# Patient Record
Sex: Female | Born: 1980
Health system: Southern US, Community
[De-identification: ages and names within clinical notes are randomized; demographics above are authoritative.]

## PROBLEM LIST (undated history)

## (undated) DIAGNOSIS — F1910 Other psychoactive substance abuse, uncomplicated: Secondary | ICD-10-CM

## (undated) DIAGNOSIS — F1310 Sedative, hypnotic or anxiolytic abuse, uncomplicated: Secondary | ICD-10-CM

## (undated) DIAGNOSIS — F1010 Alcohol abuse, uncomplicated: Secondary | ICD-10-CM

## (undated) DIAGNOSIS — F329 Major depressive disorder, single episode, unspecified: Secondary | ICD-10-CM

## (undated) DIAGNOSIS — F419 Anxiety disorder, unspecified: Secondary | ICD-10-CM

## (undated) DIAGNOSIS — F32A Depression, unspecified: Secondary | ICD-10-CM

## (undated) DIAGNOSIS — F319 Bipolar disorder, unspecified: Secondary | ICD-10-CM

---
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
## 2002-07-27 ENCOUNTER — Emergency Department (HOSPITAL_COMMUNITY): Admission: AD | Admit: 2002-07-27 | Discharge: 2002-07-27 | Payer: Self-pay | Admitting: Emergency Medicine

## 2005-06-20 ENCOUNTER — Emergency Department (HOSPITAL_COMMUNITY): Admission: EM | Admit: 2005-06-20 | Discharge: 2005-06-21 | Payer: Self-pay | Admitting: Emergency Medicine

## 2005-07-03 ENCOUNTER — Inpatient Hospital Stay (HOSPITAL_COMMUNITY): Admission: AD | Admit: 2005-07-03 | Discharge: 2005-07-06 | Payer: Self-pay | Admitting: Internal Medicine

## 2005-07-03 ENCOUNTER — Ambulatory Visit: Payer: Self-pay | Admitting: Internal Medicine

## 2005-07-03 ENCOUNTER — Encounter: Payer: Self-pay | Admitting: Emergency Medicine

## 2005-10-20 ENCOUNTER — Emergency Department (HOSPITAL_COMMUNITY): Admission: EM | Admit: 2005-10-20 | Discharge: 2005-10-21 | Payer: Self-pay | Admitting: Emergency Medicine

## 2006-07-24 ENCOUNTER — Inpatient Hospital Stay (HOSPITAL_COMMUNITY): Admission: EM | Admit: 2006-07-24 | Discharge: 2006-07-25 | Payer: Self-pay | Admitting: Emergency Medicine

## 2006-08-04 ENCOUNTER — Emergency Department (HOSPITAL_COMMUNITY): Admission: EM | Admit: 2006-08-04 | Discharge: 2006-08-05 | Payer: Self-pay | Admitting: Emergency Medicine

## 2006-08-05 ENCOUNTER — Inpatient Hospital Stay (HOSPITAL_COMMUNITY): Admission: AD | Admit: 2006-08-05 | Discharge: 2006-08-09 | Payer: Self-pay | Admitting: *Deleted

## 2006-08-06 ENCOUNTER — Ambulatory Visit: Payer: Self-pay | Admitting: *Deleted

## 2006-08-14 ENCOUNTER — Emergency Department (HOSPITAL_COMMUNITY): Admission: EM | Admit: 2006-08-14 | Discharge: 2006-08-15 | Payer: Self-pay | Admitting: *Deleted

## 2006-08-25 ENCOUNTER — Ambulatory Visit: Payer: Self-pay | Admitting: Internal Medicine

## 2006-08-25 ENCOUNTER — Inpatient Hospital Stay (HOSPITAL_COMMUNITY): Admission: EM | Admit: 2006-08-25 | Discharge: 2006-08-27 | Payer: Self-pay | Admitting: Emergency Medicine

## 2006-08-27 ENCOUNTER — Inpatient Hospital Stay (HOSPITAL_COMMUNITY): Admission: AD | Admit: 2006-08-27 | Discharge: 2006-08-31 | Payer: Self-pay | Admitting: Psychiatry

## 2006-10-04 ENCOUNTER — Emergency Department (HOSPITAL_COMMUNITY): Admission: EM | Admit: 2006-10-04 | Discharge: 2006-10-04 | Payer: Self-pay | Admitting: Emergency Medicine

## 2006-10-31 ENCOUNTER — Emergency Department (HOSPITAL_COMMUNITY): Admission: EM | Admit: 2006-10-31 | Discharge: 2006-11-01 | Payer: Self-pay | Admitting: Emergency Medicine

## 2006-11-16 ENCOUNTER — Emergency Department (HOSPITAL_COMMUNITY): Admission: EM | Admit: 2006-11-16 | Discharge: 2006-11-16 | Payer: Self-pay | Admitting: Podiatry

## 2007-10-08 ENCOUNTER — Inpatient Hospital Stay (HOSPITAL_COMMUNITY): Admission: EM | Admit: 2007-10-08 | Discharge: 2007-10-10 | Payer: Self-pay | Admitting: Emergency Medicine

## 2008-08-14 ENCOUNTER — Ambulatory Visit: Payer: Self-pay | Admitting: *Deleted

## 2008-08-14 ENCOUNTER — Inpatient Hospital Stay (HOSPITAL_COMMUNITY): Admission: AD | Admit: 2008-08-14 | Discharge: 2008-08-19 | Payer: Self-pay | Admitting: *Deleted

## 2008-12-24 ENCOUNTER — Emergency Department (HOSPITAL_COMMUNITY): Admission: EM | Admit: 2008-12-24 | Discharge: 2008-12-24 | Payer: Self-pay | Admitting: Emergency Medicine

## 2008-12-29 ENCOUNTER — Emergency Department (HOSPITAL_BASED_OUTPATIENT_CLINIC_OR_DEPARTMENT_OTHER): Admission: EM | Admit: 2008-12-29 | Discharge: 2008-12-29 | Payer: Self-pay | Admitting: Emergency Medicine

## 2009-09-10 ENCOUNTER — Emergency Department (HOSPITAL_COMMUNITY): Admission: EM | Admit: 2009-09-10 | Discharge: 2009-09-11 | Payer: Self-pay | Admitting: Infectious Diseases

## 2009-09-12 ENCOUNTER — Emergency Department (HOSPITAL_COMMUNITY): Admission: EM | Admit: 2009-09-12 | Discharge: 2009-09-12 | Payer: Self-pay | Admitting: Emergency Medicine

## 2009-11-20 ENCOUNTER — Emergency Department (HOSPITAL_COMMUNITY): Admission: EM | Admit: 2009-11-20 | Discharge: 2009-11-21 | Payer: Self-pay | Admitting: Emergency Medicine

## 2009-12-19 ENCOUNTER — Emergency Department (HOSPITAL_COMMUNITY): Admission: EM | Admit: 2009-12-19 | Discharge: 2009-12-19 | Payer: Self-pay | Admitting: Emergency Medicine

## 2009-12-19 ENCOUNTER — Inpatient Hospital Stay (HOSPITAL_COMMUNITY): Admission: AD | Admit: 2009-12-19 | Discharge: 2009-12-23 | Payer: Self-pay | Admitting: Psychiatry

## 2009-12-19 ENCOUNTER — Ambulatory Visit: Payer: Self-pay | Admitting: Psychiatry

## 2009-12-26 ENCOUNTER — Emergency Department (HOSPITAL_COMMUNITY): Admission: EM | Admit: 2009-12-26 | Discharge: 2009-12-26 | Payer: Self-pay | Admitting: Emergency Medicine

## 2009-12-27 ENCOUNTER — Ambulatory Visit (HOSPITAL_COMMUNITY): Admission: RE | Admit: 2009-12-27 | Discharge: 2009-12-27 | Payer: Self-pay | Admitting: Psychiatry

## 2009-12-27 ENCOUNTER — Emergency Department (HOSPITAL_COMMUNITY): Admission: EM | Admit: 2009-12-27 | Discharge: 2009-12-28 | Payer: Self-pay | Admitting: Emergency Medicine

## 2009-12-28 ENCOUNTER — Inpatient Hospital Stay (HOSPITAL_COMMUNITY): Admission: AD | Admit: 2009-12-28 | Discharge: 2009-12-30 | Payer: Self-pay | Admitting: Psychiatry

## 2010-01-17 ENCOUNTER — Emergency Department (HOSPITAL_COMMUNITY): Admission: EM | Admit: 2010-01-17 | Discharge: 2010-01-18 | Payer: Self-pay | Admitting: Emergency Medicine

## 2010-01-18 ENCOUNTER — Inpatient Hospital Stay (HOSPITAL_COMMUNITY): Admission: AD | Admit: 2010-01-18 | Discharge: 2010-01-29 | Payer: Self-pay | Admitting: Psychiatry

## 2010-02-03 ENCOUNTER — Emergency Department (HOSPITAL_COMMUNITY): Admission: EM | Admit: 2010-02-03 | Discharge: 2010-02-04 | Payer: Self-pay | Admitting: Emergency Medicine

## 2010-02-03 ENCOUNTER — Ambulatory Visit (HOSPITAL_COMMUNITY): Admission: RE | Admit: 2010-02-03 | Discharge: 2010-02-03 | Payer: Self-pay | Admitting: Psychiatry

## 2010-04-21 ENCOUNTER — Emergency Department (HOSPITAL_COMMUNITY)
Admission: EM | Admit: 2010-04-21 | Discharge: 2010-04-22 | Disposition: A | Discharge status: Other Acute Inpatient Hospital | Payer: Self-pay | Source: Home / Self Care | Admitting: Emergency Medicine

## 2010-04-22 ENCOUNTER — Inpatient Hospital Stay (HOSPITAL_COMMUNITY)
Admission: RE | Admit: 2010-04-22 | Discharge: 2010-05-02 | Payer: Self-pay | Source: Home / Self Care | Attending: Psychiatry | Admitting: Psychiatry

## 2010-04-25 LAB — URINE MICROSCOPIC-ADD ON

## 2010-04-25 LAB — BASIC METABOLIC PANEL
BUN: 7 mg/dL (ref 6–23)
CO2: 23 mEq/L (ref 19–32)
Calcium: 9.1 mg/dL (ref 8.4–10.5)
Chloride: 110 mEq/L (ref 96–112)
Creatinine, Ser: 0.55 mg/dL (ref 0.4–1.2)
GFR calc Af Amer: >60 mL/min (ref >60)
GFR calc non Af Amer: >60 mL/min (ref >60)
Glucose, Bld: 95 mg/dL (ref 70–99)
Potassium: 3.8 mEq/L (ref 3.5–5.1)
Sodium: 141 mEq/L (ref 135–145)

## 2010-04-25 LAB — DIFFERENTIAL
Basophils Absolute: 0 10*3/uL (ref 0.0–0.1)
Basophils Relative: 0 % (ref 0–1)
Eosinophils Absolute: 0 10*3/uL (ref 0.0–0.7)
Eosinophils Relative: 0 % (ref 0–5)
Lymphocytes Relative: 54 % — ABNORMAL HIGH (ref 12–46)
Lymphs Abs: 2.6 10*3/uL (ref 0.7–4.0)
Monocytes Absolute: 0.4 10*3/uL (ref 0.1–1.0)
Monocytes Relative: 9 % (ref 3–12)
Neutro Abs: 1.8 10*3/uL (ref 1.7–7.7)
Neutrophils Relative %: 36 % — ABNORMAL LOW (ref 43–77)

## 2010-04-25 LAB — CBC
HCT: 38.9 % (ref 36.0–46.0)
Hemoglobin: 13.4 g/dL (ref 12.0–15.0)
MCH: 33.4 pg (ref 26.0–34.0)
MCHC: 34.4 g/dL (ref 30.0–36.0)
MCV: 97 fL (ref 78.0–100.0)
Platelets: 322 10*3/uL (ref 150–400)
RBC: 4.01 MIL/uL (ref 3.87–5.11)
RDW: 12.4 % (ref 11.5–15.5)
WBC: 4.9 10*3/uL (ref 4.0–10.5)

## 2010-04-25 LAB — RAPID URINE DRUG SCREEN, HOSP PERFORMED
Amphetamines: NOT DETECTED
Barbiturates: NOT DETECTED
Benzodiazepines: NOT DETECTED
Cocaine: NOT DETECTED
Opiates: NOT DETECTED
Tetrahydrocannabinol: POSITIVE — AB

## 2010-04-25 LAB — URINALYSIS, ROUTINE W REFLEX MICROSCOPIC
Bilirubin Urine: NEGATIVE
Ketones, ur: NEGATIVE mg/dL
Leukocytes, UA: NEGATIVE
Nitrite: NEGATIVE
Protein, ur: NEGATIVE mg/dL
Specific Gravity, Urine: 1.008 (ref 1.005–1.030)
Urine Glucose, Fasting: NEGATIVE mg/dL
Urobilinogen, UA: 0.2 mg/dL (ref 0.0–1.0)
pH: 7 (ref 5.0–8.0)

## 2010-04-25 LAB — TRICYCLICS SCREEN, URINE: TCA Scrn: NOT DETECTED

## 2010-04-25 LAB — ETHANOL: Alcohol, Ethyl (B): 135 mg/dL — ABNORMAL HIGH (ref 0–10)

## 2010-04-27 LAB — COMPREHENSIVE METABOLIC PANEL
ALT: 15 U/L (ref 0–35)
AST: 16 U/L (ref 0–37)
Albumin: 3.6 g/dL (ref 3.5–5.2)
Alkaline Phosphatase: 39 U/L (ref 39–117)
BUN: 10 mg/dL (ref 6–23)
CO2: 25 mEq/L (ref 19–32)
Calcium: 9.1 mg/dL (ref 8.4–10.5)
Chloride: 106 mEq/L (ref 96–112)
Creatinine, Ser: 0.76 mg/dL (ref 0.4–1.2)
GFR calc Af Amer: >60 mL/min (ref >60)
GFR calc non Af Amer: >60 mL/min (ref >60)
Glucose, Bld: 141 mg/dL — ABNORMAL HIGH (ref 70–99)
Potassium: 3.8 mEq/L (ref 3.5–5.1)
Sodium: 139 mEq/L (ref 135–145)
Total Bilirubin: 0.5 mg/dL (ref 0.3–1.2)
Total Protein: 6.6 g/dL (ref 6.0–8.3)

## 2010-04-27 LAB — VALPROIC ACID LEVEL: Valproic Acid Lvl: 74.4 ug/mL (ref 50.0–100.0)
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
## 2010-06-22 LAB — DIFFERENTIAL
Basophils Absolute: 0 10*3/uL (ref 0.0–0.1)
Basophils Relative: 1 % (ref 0–1)
Eosinophils Absolute: 0 10*3/uL (ref 0.0–0.7)
Eosinophils Relative: 1 % (ref 0–5)
Lymphocytes Relative: 46 % (ref 12–46)
Lymphs Abs: 2.6 10*3/uL (ref 0.7–4.0)
Monocytes Absolute: 0.5 10*3/uL (ref 0.1–1.0)
Monocytes Relative: 8 % (ref 3–12)
Neutro Abs: 2.6 10*3/uL (ref 1.7–7.7)
Neutrophils Relative %: 45 % (ref 43–77)

## 2010-06-22 LAB — BASIC METABOLIC PANEL
BUN: 9 mg/dL (ref 6–23)
CO2: 21 mEq/L (ref 19–32)
Calcium: 8.9 mg/dL (ref 8.4–10.5)
Chloride: 109 mEq/L (ref 96–112)
Creatinine, Ser: 0.65 mg/dL (ref 0.4–1.2)
GFR calc Af Amer: >60 mL/min (ref >60)
GFR calc non Af Amer: >60 mL/min (ref >60)
Glucose, Bld: 95 mg/dL (ref 70–99)
Potassium: 3.7 mEq/L (ref 3.5–5.1)
Sodium: 140 mEq/L (ref 135–145)

## 2010-06-22 LAB — ETHANOL: Alcohol, Ethyl (B): 117 mg/dL — ABNORMAL HIGH (ref 0–10)

## 2010-06-22 LAB — RAPID URINE DRUG SCREEN, HOSP PERFORMED
Amphetamines: NOT DETECTED
Barbiturates: NOT DETECTED
Benzodiazepines: POSITIVE — AB
Cocaine: NOT DETECTED
Opiates: NOT DETECTED
Tetrahydrocannabinol: POSITIVE — AB

## 2010-06-22 LAB — CBC
HCT: 34.4 % — ABNORMAL LOW (ref 36.0–46.0)
Hemoglobin: 12 g/dL (ref 12.0–15.0)
MCH: 34.4 pg — ABNORMAL HIGH (ref 26.0–34.0)
MCHC: 34.9 g/dL (ref 30.0–36.0)
MCV: 98.5 fL (ref 78.0–100.0)
Platelets: 281 10*3/uL (ref 150–400)
RBC: 3.49 MIL/uL — ABNORMAL LOW (ref 3.87–5.11)
RDW: 12.8 % (ref 11.5–15.5)
WBC: 5.7 10*3/uL (ref 4.0–10.5)

## 2010-06-22 LAB — POCT PREGNANCY, URINE: Preg Test, Ur: NEGATIVE

## 2010-06-22 LAB — TRICYCLICS SCREEN, URINE: TCA Scrn: NOT DETECTED

## 2010-06-23 LAB — BASIC METABOLIC PANEL WITH GFR
BUN: 7 mg/dL (ref 6–23)
CO2: 21 meq/L (ref 19–32)
Calcium: 8.7 mg/dL (ref 8.4–10.5)
Chloride: 114 meq/L — ABNORMAL HIGH (ref 96–112)
Creatinine, Ser: 0.49 mg/dL (ref 0.4–1.2)
GFR calc non Af Amer: >60 mL/min
Glucose, Bld: 97 mg/dL (ref 70–99)
Potassium: 3.6 meq/L (ref 3.5–5.1)
Sodium: 141 meq/L (ref 135–145)

## 2010-06-23 LAB — BASIC METABOLIC PANEL
BUN: 6 mg/dL (ref 6–23)
CO2: 24 mEq/L (ref 19–32)
Calcium: 9.6 mg/dL (ref 8.4–10.5)
Chloride: 107 mEq/L (ref 96–112)
Chloride: 109 mEq/L (ref 96–112)
Creatinine, Ser: 0.56 mg/dL (ref 0.4–1.2)
Creatinine, Ser: 0.58 mg/dL (ref 0.4–1.2)
GFR calc Af Amer: >60 mL/min (ref >60)
GFR calc Af Amer: >60 mL/min (ref >60)
GFR calc non Af Amer: >60 mL/min (ref >60)
Potassium: 4.2 mEq/L (ref 3.5–5.1)
Sodium: 140 mEq/L (ref 135–145)
Sodium: 140 mEq/L (ref 135–145)

## 2010-06-23 LAB — CBC
HCT: 36.8 % (ref 36.0–46.0)
HCT: 39 % (ref 36.0–46.0)
Hemoglobin: 12.9 g/dL (ref 12.0–15.0)
Hemoglobin: 12.9 g/dL (ref 12.0–15.0)
Hemoglobin: 13.6 g/dL (ref 12.0–15.0)
MCH: 34 pg (ref 26.0–34.0)
MCH: 34.5 pg — ABNORMAL HIGH (ref 26.0–34.0)
MCH: 34.6 pg — ABNORMAL HIGH (ref 26.0–34.0)
MCHC: 35 g/dL (ref 30.0–36.0)
MCHC: 35 g/dL (ref 30.0–36.0)
MCV: 98.7 fL (ref 78.0–100.0)
MCV: 98.9 fL (ref 78.0–100.0)
MCV: 99 fL (ref 78.0–100.0)
Platelets: 301 10*3/uL (ref 150–400)
Platelets: 322 10*3/uL (ref 150–400)
RBC: 3.72 MIL/uL — ABNORMAL LOW (ref 3.87–5.11)
RBC: 3.73 MIL/uL — ABNORMAL LOW (ref 3.87–5.11)
RBC: 3.96 MIL/uL (ref 3.87–5.11)
RDW: 12.9 % (ref 11.5–15.5)
RDW: 13 % (ref 11.5–15.5)
WBC: 5.2 10*3/uL (ref 4.0–10.5)
WBC: 5.6 10*3/uL (ref 4.0–10.5)

## 2010-06-23 LAB — HEPATIC FUNCTION PANEL
ALT: 12 U/L (ref 0–35)
ALT: 16 U/L (ref 0–35)
AST: 13 U/L (ref 0–37)
AST: 15 U/L (ref 0–37)
Albumin: 4.2 g/dL (ref 3.5–5.2)
Alkaline Phosphatase: 41 U/L (ref 39–117)
Alkaline Phosphatase: 48 U/L (ref 39–117)
Bilirubin, Direct: 0.1 mg/dL (ref 0.0–0.3)
Bilirubin, Direct: 0.1 mg/dL (ref 0.0–0.3)
Indirect Bilirubin: 0.3 mg/dL (ref 0.3–0.9)
Total Bilirubin: 0.4 mg/dL (ref 0.3–1.2)
Total Bilirubin: 0.4 mg/dL (ref 0.3–1.2)
Total Protein: 7.2 g/dL (ref 6.0–8.3)

## 2010-06-23 LAB — DIFFERENTIAL
Basophils Absolute: 0 10*3/uL (ref 0.0–0.1)
Basophils Relative: 1 % (ref 0–1)
Basophils Relative: 1 % (ref 0–1)
Eosinophils Absolute: 0 10*3/uL (ref 0.0–0.7)
Eosinophils Absolute: 0.1 10*3/uL (ref 0.0–0.7)
Eosinophils Relative: 1 % (ref 0–5)
Eosinophils Relative: 2 % (ref 0–5)
Lymphocytes Relative: 39 % (ref 12–46)
Lymphocytes Relative: 43 % (ref 12–46)
Lymphs Abs: 2.1 10*3/uL (ref 0.7–4.0)
Monocytes Absolute: 0.2 10*3/uL (ref 0.1–1.0)
Monocytes Relative: 4 % (ref 3–12)
Monocytes Relative: 7 % (ref 3–12)
Monocytes Relative: 6 % (ref 3–12)
Neutro Abs: 2.8 10*3/uL (ref 1.7–7.7)
Neutro Abs: 2.2 10*3/uL (ref 1.7–7.7)
Neutrophils Relative %: 56 % (ref 43–77)
Neutrophils Relative %: 42 % — ABNORMAL LOW (ref 43–77)

## 2010-06-23 LAB — TRICYCLICS SCREEN, URINE: TCA Scrn: NOT DETECTED

## 2010-06-23 LAB — RAPID URINE DRUG SCREEN, HOSP PERFORMED
Amphetamines: NOT DETECTED
Amphetamines: NOT DETECTED
Barbiturates: NOT DETECTED
Barbiturates: NOT DETECTED
Benzodiazepines: NOT DETECTED
Benzodiazepines: NOT DETECTED
Cocaine: NOT DETECTED
Cocaine: NOT DETECTED
Opiates: NOT DETECTED
Opiates: NOT DETECTED
Tetrahydrocannabinol: POSITIVE — AB
Tetrahydrocannabinol: POSITIVE — AB
Tetrahydrocannabinol: POSITIVE — AB
Tetrahydrocannabinol: POSITIVE — AB

## 2010-06-23 LAB — TSH: TSH: 2.017 u[IU]/mL (ref 0.350–4.500)

## 2010-06-23 LAB — URINALYSIS, ROUTINE W REFLEX MICROSCOPIC
Bilirubin Urine: NEGATIVE
Glucose, UA: NEGATIVE mg/dL
Ketones, ur: NEGATIVE mg/dL
Leukocytes, UA: NEGATIVE
Nitrite: NEGATIVE
Protein, ur: NEGATIVE mg/dL
Specific Gravity, Urine: 1.008 (ref 1.005–1.030)
Urobilinogen, UA: 0.2 mg/dL (ref 0.0–1.0)
pH: 5.5 (ref 5.0–8.0)

## 2010-06-23 LAB — ACETAMINOPHEN LEVEL: Acetaminophen (Tylenol), Serum: <10 ug/mL — ABNORMAL LOW (ref 10–30)

## 2010-06-23 LAB — PREGNANCY, URINE: Preg Test, Ur: NEGATIVE

## 2010-06-23 LAB — URINE MICROSCOPIC-ADD ON

## 2010-06-23 LAB — ETHANOL: Alcohol, Ethyl (B): 131 mg/dL — ABNORMAL HIGH (ref 0–10)

## 2010-06-23 LAB — POCT PREGNANCY, URINE: Preg Test, Ur: NEGATIVE

## 2010-06-24 LAB — URINE MICROSCOPIC-ADD ON

## 2010-06-24 LAB — URINALYSIS, ROUTINE W REFLEX MICROSCOPIC
Leukocytes, UA: NEGATIVE
Nitrite: NEGATIVE
Specific Gravity, Urine: 1.021 (ref 1.005–1.030)
pH: 6 (ref 5.0–8.0)

## 2010-07-15 LAB — BASIC METABOLIC PANEL
CO2: 24 mEq/L (ref 19–32)
Calcium: 9.7 mg/dL (ref 8.4–10.5)
Creatinine, Ser: 0.6 mg/dL (ref 0.4–1.2)
Glucose, Bld: 92 mg/dL (ref 70–99)

## 2010-07-15 LAB — DIFFERENTIAL
Basophils Absolute: 0.1 10*3/uL (ref 0.0–0.1)
Basophils Relative: 1 % (ref 0–1)
Eosinophils Absolute: 0.1 10*3/uL (ref 0.0–0.7)
Monocytes Absolute: 0.6 10*3/uL (ref 0.1–1.0)
Neutro Abs: 5 10*3/uL (ref 1.7–7.7)
Neutrophils Relative %: 55 % (ref 43–77)

## 2010-07-15 LAB — URINALYSIS, ROUTINE W REFLEX MICROSCOPIC
Ketones, ur: NEGATIVE mg/dL
Protein, ur: NEGATIVE mg/dL
Urobilinogen, UA: 1 mg/dL (ref 0.0–1.0)

## 2010-07-15 LAB — POCT TOXICOLOGY PANEL

## 2010-07-15 LAB — CBC
MCHC: 34.5 g/dL (ref 30.0–36.0)
RDW: 11.6 % (ref 11.5–15.5)

## 2010-07-15 LAB — URINE MICROSCOPIC-ADD ON

## 2010-07-19 LAB — URINALYSIS, MICROSCOPIC ONLY
Glucose, UA: NEGATIVE mg/dL
Hgb urine dipstick: NEGATIVE
Leukocytes, UA: NEGATIVE
Protein, ur: 100 mg/dL — AB
pH: 5.5 (ref 5.0–8.0)

## 2010-07-19 LAB — COMPREHENSIVE METABOLIC PANEL
Albumin: 4.2 g/dL (ref 3.5–5.2)
BUN: 10 mg/dL (ref 6–23)
Calcium: 9.6 mg/dL (ref 8.4–10.5)
Creatinine, Ser: 0.8 mg/dL (ref 0.4–1.2)
Glucose, Bld: 89 mg/dL (ref 70–99)
Potassium: 3.2 mEq/L — ABNORMAL LOW (ref 3.5–5.1)
Total Protein: 6.8 g/dL (ref 6.0–8.3)

## 2010-07-19 LAB — DRUGS OF ABUSE SCREEN W/O ALC, ROUTINE URINE
Amphetamine Screen, Ur: NEGATIVE
Barbiturate Quant, Ur: NEGATIVE
Creatinine,U: 262.2 mg/dL
Opiate Screen, Urine: NEGATIVE
Phencyclidine (PCP): NEGATIVE

## 2010-07-19 LAB — CBC
HCT: 37.9 % (ref 36.0–46.0)
Hemoglobin: 13.4 g/dL (ref 12.0–15.0)
MCHC: 35.3 g/dL (ref 30.0–36.0)
MCV: 99.5 fL (ref 78.0–100.0)
Platelets: 305 10*3/uL (ref 150–400)
RDW: 12.8 % (ref 11.5–15.5)

## 2010-07-19 LAB — BENZODIAZEPINE, QUANTITATIVE, URINE
Alprazolam (GC/LC/MS), ur confirm: NEGATIVE
Flurazepam GC/MS Conf: NEGATIVE
Oxazepam GC/MS Conf: 240 ng/mL

## 2010-07-19 LAB — THC (MARIJUANA), URINE, CONFIRMATION: Marijuana, Ur-Confirmation: 41 ng/mL

## 2010-07-19 LAB — TSH: TSH: 1.664 u[IU]/mL (ref 0.350–4.500)
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
## 2010-08-23 NOTE — Consult Note (Signed)
NAMEARLENY, [name]            ACCOUNT NO.:  [id number]   MEDICAL RECORD NO.:  [id number]          PATIENT TYPE:  INP   LOCATION:  5529                         FACILITY:  [facility]   PHYSICIAN:  Antonietta Breach, M.D.  DATE OF BIRTH:  1981-01-17   DATE OF CONSULTATION:  10/10/2007  DATE OF DISCHARGE:                                 CONSULTATION   Ms. [name] has recovered from her intoxicated state.  She recognizes  that the alcohol intoxication impaired her judgment and increased her  impulsivity.   She asked that her father attend part of the session to help with the  history.  Her father and the patient both describe how the patient has  been talking about constructive future goals and interests including  finishing her high school education and going to [employer] school.  She  does not have any thoughts of harming herself or others.  She has no  delusions or hallucinations.  She is socially appropriate and  cooperative.   MENTAL STATUS EXAM:  Ms. [name] is alert.  She is oriented to all  spheres.  Speech within normal limits.  Affect broad and appropriate,  mood within normal limits.  Thought process logical, coherent, goal-  directed.  No looseness of associations.  Thought content:  No thoughts  of harming herself, no thoughts of harming others, no delusions, no  hallucinations.  Judgment intact.  Insight intact.   ASSESSMENT:  1. 293.83 mood disorder not otherwise specified now stable.  2. Alcohol abuse.   Ms. [name] is no longer at risk to harm herself or others now that she  is recovered from her intoxication.  She agrees to call emergency  services immediately for any thoughts of harming herself, thoughts of  harming others or other psychiatric emergency symptoms.   The undersigned did recommend that the patient be admitted for an  inpatient psychiatric dual diagnosis program and proceed with alcohol  rehabilitation.  However, the patient declined and she is no  longer  committable after recovering from her intoxicated state.   The patient wants to proceed with outpatient counseling and 12 step  groups.  The social worker can help arrange these for the patient and  give her 12 step information.  She will have psychiatric medication  management with her county mental health psychiatrist.  She will have no  change in her outpatient psychiatric medications.      Antonietta Breach, M.D.  Electronically Signed     JW/MEDQ  D:  10/10/2007  T:  10/10/2007  Job:  [id number]

## 2010-08-23 NOTE — Consult Note (Signed)
[name], [name]            ACCOUNT NO.:  [id number]   MEDICAL RECORD NO.:  [id number]          PATIENT TYPE:  INP   LOCATION:  3316                         FACILITY:  [facility]   PHYSICIAN:  Antonietta Breach, M.D.  DATE OF BIRTH:  [birth year]   DATE OF CONSULTATION:  10/08/2007  DATE OF DISCHARGE:                                 CONSULTATION   REASON FOR CONSULTATION:  The patient overdosed on medication.   REQUESTING PHYSICIAN:  [facility].   HISTORY OF PRESENT ILLNESS:  [name] is a 30 year old female  admitted to the [facility] on October 08, 2007 due to an overdose.   The patient has a very depressed mood.  Her energy is poor.  She is  concentrating poorly.  She states that she does not know why she tried  to kill herself.  She has poor insight and judgment.   PAST PSYCHIATRIC HISTORY:  In review of the past medical record, the  patient has bipolar disorder.  She also overdosed in November of 2008.  She required admission to a psychiatric unit, at [facility].  At that time she was discharged on Depakote 1000 mg daily,  Celexa 20 mg daily, Thorazine 100 mg t.i.d. as well as Neurontin 200 mg  t.i.d.   FAMILY PSYCHIATRIC HISTORY:  None known.   SOCIAL HISTORY:  The patient he is single.  Her urine drug screen was  positive for tetrahydrocannabinol.   PAST MEDICAL HISTORY:  Status post overdose.   MEDICATIONS:  MAR reviewed.  1. The patient is on Celexa 20 mg daily.  2. Depakote 1000 mg nightly.  3. Neurontin 300 mg t.i.d.   She has no known drug allergies.   LABORATORY DATA:  WBC 7.0, hemoglobin 13.4, platelet count 313,000.  Aspirin, Tylenol, magnesium all unremarkable.  Depakote showed no level.  Urine drug screen as above.  Alcohol was 184.   REVIEW OF SYSTEMS:  Noncontributory.   MENTAL STATUS EXAM:  Please see the history of present illness.  The  patient's affect is very constricted.  Mood is depressed.  Judgment  impaired.  Insight poor.  Concentration decreased.  Thought process  coherent.   ASSESSMENT:  AXIS I:  293.83  Mood disorder not otherwise specified.  Rule out alcohol dependence.  AXIS II:  Deferred.  AXIS III:  Status post overdose.  AXIS IV:  Primary support group.  AXIS V:  30.   This patient for admission.  This is her second suicide attempt in less  than a year.   RECOMMENDATIONS:  Would continue suicide precautions.  Would admit to a  psychiatric unit as soon as she is medically cleared.   Would also have Ativan available 1 to 2 mg q.4 hours p.r.n. withdrawal.  Other psychotropic medications deferred.      Antonietta Breach, M.D.  Electronically Signed     JW/MEDQ  D:  10/08/2007  T:  10/08/2007  Job:  [id number]

## 2010-08-23 NOTE — H&P (Signed)
[name], [name]            ACCOUNT NO.:  [id number]   MEDICAL RECORD NO.:  [id number]          PATIENT TYPE:  INP   LOCATION:  1824                         FACILITY:  [facility]   PHYSICIAN:  Lonia Blood, M.D.       DATE OF BIRTH:  04-13-1980   DATE OF ADMISSION:  10/08/2007  DATE OF DISCHARGE:                              HISTORY & PHYSICAL   PRIMARY CARE PHYSICIAN:  This patient does not have a primary care  physician.   CHIEF COMPLAINT:  Overdose.   HISTORY OF PRESENT ILLNESS:  [name] is a 30 year old woman, who was  brought in tonight by EMS after she took about 10 tablets of Thorazine  with alcohol.  The patient was observed in the emergency room and was  referred for admission about 4 hours after initial presentation.  Currently, I can arouse the patient and when asked she denies suicidal  ideations or suicide intent.  She will not doze, stay awake, and  cooperate with the interviewer over the physical examination.  She  politely is telling me not to  examine her.  According to the old  records, [name] has bipolar disorder and she has had multiple  overdoses.   CURRENT MEDICATIONS:  Most likely the patient is on Depakote, Celexa,  Thorazine, and Neurontin.   SOCIAL HISTORY:  According to the old records, the patient is abusing  tobacco and alcohol as well as cannabis and cocaine.   ALLERGIES:  No known drug allergies.   FAMILY HISTORY:  The patient is refusing to answer the questions.   REVIEW OF SYSTEMS:  The patient is refusing to answer the questions.   PHYSICAL EXAM UPON ADMISSION:  VITAL SIGNS:  Temperature 97.4, blood  pressure 113/78, pulse 147, respirations 22, saturation 97% on room air.  GENERAL APPEARANCE:  Well-developed, well-nourished woman lying on  stretcher, arousable, refusing to cooperate with exam, refusing for me  to fully examine her.  She seems to be able to move all her extremities.  HEAD:  Appears normocephalic, atraumatic.  CHEST:  Has even breath sounds, unlabored.  Telemetry strip shows sinus  tachycardia.  SKIN:  Appears warm, dry, without any visible rashes.   LABORATORY VALUES:  On admission, white blood cell count 7000,  hemoglobin 13,  platelet count 313, alcohol 184, sodium 141, potassium  3.7, chloride 108, BUN 9, creatinine 0.7.  Urine drug screen is positive  marijuana.  Alcohol level is 184.   ASSESSMENT AND PLAN:  1. Thorazine overdose.  [name] will be admitted to step-down      unit.  She will be placed on intravenous fluids.  Telemetry      monitoring will be undertaken to detect any arrhythmias.  EKG      obtained in the emergency room indicates sinus tachycardia without      QRS or QT prolongation.  The patient's acetaminophen and salicylate      and magnesium levels will be checked bed in the emergency room.      The patient will be placed on suicide precautions and a psychiatric  consultation will be obtained in the morning.  2. Polysubstance abuse.  Counseling and referral for substance abuse      treatment will be undertaken.  3. Alcohol intoxication.  The patient will be placed on fluids and      intravenous vitamins.   I have personally called to [facility] and they  advised [place] to monitor the patient for arrhythmias, monitor her blood  pressure, temperature, and monitor her for seizures.  The care for this  overdose will be symptomatic and plans will be dictated by further  clinical course.      Lonia Blood, M.D.  Electronically Signed     SL/MEDQ  D:  10/08/2007  T:  10/08/2007  Job:  [id number]
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
## 2010-08-23 NOTE — H&P (Signed)
[name], [name]            ACCOUNT NO.:  [id number]   MEDICAL RECORD NO.:  [id number]          PATIENT TYPE:  IPS   LOCATION:  0600                          [facility]ACILITY:  [facility]   PHYSICIAN:  Anselm Jungling, MD  DATE OF BIRTH:  January 18, 1981   DATE OF ADMISSION:  08/27/2006  DATE OF DISCHARGE:                       PSYCHIATRIC ADMISSION ASSESSMENT   DATE OF ASSESSMENT:  Aug 27, 2004 at 9:30 a.m.   IDENTIFYING INFORMATION:  A 30 year old white female who is single.  This is a voluntary admission.   HISTORY OF PRESENT ILLNESS:  This is a 30 year old patient who was  transferred from the Internal Medicine Teaching Service where she had  been hospitalized from May 17 through Aug 27, 2006, after taking an  overdose of approximately 2-4 pills of Depakote 500 mg, 10-15 pills of  lithium 300 mg, and also a handful of Celexa 20 mg and an unknown amount  of Xanax which she had obtained from a friend.  She also said that she  had used about $120.00 worth of cocaine that day and admits that she has  been drinking about a fifth of alcohol every day.  She was medically  stabilized and transferred for inpatient psychiatric treatment.  She  reports that her trigger for continuing to abuse substances and for  suicide was  ongoing argument and friction with her same-sex partner  with whom she has been in a 2-year relationship.  She reports her  partner wants to breakup with her, but she is unable to let the  relationship go, would like to reconcile and remain together, but the  partner has problems with the patient's substance use.  The patient  reports that, after her discharge from [facility] 2 weeks ago, she continued to use substances without any  significant period of abstinence.  Longest period ever abstinent from  substances was several months with the help of staying in church and  support of her church friends.  She is most recently living with her  parents and  may return there.  She denies any suicidal thoughts today,  voices intent to remain abstinent from substances.  She also reports  that she was not taking her medications because of inability to afford  them, but she reports that she felt she was doing well on Depakote,  Neurontin, Thorazine and Celexa.  She is currently sponsored by [facility].   PAST PSYCHIATRIC HISTORY:  The patient's second admission to [facility] with a history of previous admission  approximately 2 weeks ago.  At that time, she was on the service of Dr.  Milford Cage, also treated for polysubstance abuse and mood disorder.  She has a history of polysubstance abuse with longest period abstinent  is unclear.  She has a history of multiple suicide attempts by overdose.   SOCIAL HISTORY:  Single white female in same-sex relationship.  Currently, has a court date pending on May 23 for multiple misdemeanor  convictions and violation of parole.  She is currently living with her  parents.  No children.  MEDICAL HISTORY:  No regular primary care Marsha Gundlach.  She was followed on  the teaching service by Dr. Duncan Dull.  Medical problems include  status post polypharmacy overdose and obesity.   CURRENT MEDICATIONS:  From her previous discharge at [facility] 2 weeks ago:  Lithium 300 mg p.o. b.i.d.;  Lamictal 25 mg daily; Thorazine 200 mg 3 times a day; Celexa 20 mg  daily; Neurontin 200 mg q.4 h p.r.n. for agitation and valproic acid  1000 mg p.o. nightly.   VITAL SIGNS:  Physical exam was done in the emergency room.  Her vital  signs are normal.  She is fully alert here.  5 feet 4 inches tall, 190  pounds, temperature 97.7, pulse 66, respirations 20, blood pressure  132/90.  MOTOR:  Normal.   Most remarkable thing in her diagnostic studies while on the medicine  unit is her lithium level at one point did reach a peak of 2.33 and then  resolved.   Her lithium will not be restarted.  Other details on  diagnostic studies are generally unremarkable and noted in the record.   MENTAL STATUS EXAMINATION:  Today, fully alert female with normal motor  exam, cooperative with blunt affect.  Speech is minimal, normal pace and  tone.  She is adequately articulate.  Mood is euthymic.  She is  minimizing her substance use and the role that it plays in her  relationships and mood.  Insight is superficial.  Thought processes  logical, coherent.  No evidence of suicidal thoughts today.  No  homicidal thoughts.  Cognition is well-preserved.  Calculation and  concentration are intact.   AXIS I:  Mood disorder NOS.  EtOH abuse rule out dependence,  polysubstance abuse.  AXIS II:  Deferred.  AXIS III:  Status post polypharmacy overdose and obesity.  AXIS IV:  Severe issues with chronic relationship discord.  AXIS V:  Current 30, past year 58.   Plan is to voluntarily admit the patient to completely detox her,  stabilize her mood and alleviate any suicidal thoughts.  We have offered  to have a family session with her girlfriend to try to talk through some  of the relationship issues and confront any problems and gauge the level  of support there, which the patient will consider, but she has not  discussed at this point.  We are going to restart her Depakote at 1000  mg daily, along with her Neurontin and her Thorazine, and we will check  a Depakote level on the morning of May 23 and plan on having her follow  up with [facility].  Estimated length of stay is 5  days.      Margaret A. Lorin Picket, N.P.      Anselm Jungling, MD  Electronically Signed    MAS/MEDQ  D:  08/28/2006  T:  08/28/2006  Job:  [id number]

## 2010-08-23 NOTE — Discharge Summary (Signed)
[name], [name]            ACCOUNT NO.:  [id number]   MEDICAL RECORD NO.:  [id number]          PATIENT TYPE:  INP   LOCATION:  5529                         FACILITY:  [facility]   PHYSICIAN:  Marcellus Scott, MD     DATE OF BIRTH:  12-21-1980   DATE OF ADMISSION:  10/08/2007  DATE OF DISCHARGE:  10/10/2007                               DISCHARGE SUMMARY   PRIMARY MEDICAL DOCTOR:  Gentry Fitz.   DISCHARGE DIAGNOSES:  1. Thorazine overdose.  2. Alcohol intoxication/abuse.  3. Polysubstance abuse.  4. Mood disorder, not otherwise  specified.  5. Tobacco abuse.  6. Sinus tachycardia: Resolved.   DISCHARGE MEDICATIONS:  1. Neurontin 400 mg p.o. t.i.d.  2. Depakote 1000 mg p.o. q.h.s.  3. Celexa 40 mg p.o. daily.  4. Trazodone 100 mg p.o. q.h.s.  5. Thiamine 100 mg p.o. daily.  6. Folic acid 1 mg orally daily.   DISCONTINUED MEDICATIONS:  Thorazine.   PROCEDURES:  None.   PERTINENT LABS:  Basic metabolic panel unremarkable with BUN of 5,  creatinine 0.66.  CBC is unremarkable with hemoglobin of 12.6,  hematocrit 36.4, white blood cells 6.6, platelets 284.  Serum salicylate  less than 4.  Magnesium 1.9.  Blood acetaminophen level less than 10.  Valproic acid level less than 10.  Urine drug screen positive for  tetrahydrocannabinol.  Blood alcohol level 184.   CONSULTATIONS:  Psychiatry, Dr. Jeanie Sewer.   HOSPITAL COURSE/PATIENT DISPOSITION:  Please refer to the history and  physical note for initial admission details.  In summary, [name] is  a 30 year old Caucasian female patient with history of previous drug  overdoses and mood disorder who was brought in by EMS after she took 10  tablets of Thorazine with alcohol.  She was admitted to the hospital.  She denied suicidal ideations or intent.  She was admitted to the  [facility] for further evaluation and management.   1. Thorazine overdose:  The patient was admitted to the [facility] Unit      on Telemetry.  She was  provided IV fluids.  Her EKG showed sinus      tachycardia without QRS or Q-T prolongations.  Her acetaminophen      and salicylate levels were normal.  A one-on-one sitter was placed      for suicide precautions.  Poison Control was contacted and they      indicated to monitor for arrhythmias, blood pressure & temperature      elevations and seizures.  She did not have any significant      arrhythmias.  Her vitals remained stable and she did not have any      seizures.  The patient is not usually on Thorazine.  Psychiatry saw      the patient today again and indicated that she is no longer at risk      to harm herself or others now that she has recovered from alcohol      intoxication. She recognized to Dr. Jeanie Sewer that the alcohol      intoxication impaired her judgment and increased her impulsivity.  The patient has agreed to call emergency services immediately for      thoughts of harming herself, thoughts of harming others or other      psychiatric emergency symptoms.  She was initially recommended for      inpatient psychiatric admission when she was less responsive.      However, now that she is fully coherent, she declined the offer and      she is not committable.  She wants to proceed to outpatient      counseling in positive groups.  The social worker will arrange this      for the patient.  The above psychiatric medication doses were      confirmed after the patient called her father.  She is to followup      with the [facility].  2. Alcohol intoxication:  The patient has not demonstrated any overt      withdrawal symptoms.  She was briefly on p.r.n. Ativan.  She      indicates that she does not consume alcohol on a daily basis.  She      is advised abstinence.  3. Polysubstance abuse including tetrahydrocannabinol:  Counseled.  4. Mood disorder, not otherwise specified:  Management per problem      number 1.  5. Tobacco abuse:  The patient was  counseled regarding cessation.  She      refuses nicotine patch on discharge.  6. Sinus tachycardia:  Resolved.   DISPOSITION:  The patient is stable for discharge home.      Marcellus Scott, MD  Electronically Signed     AH/MEDQ  D:  10/10/2007  T:  10/10/2007  Job:  [id number]   cc:   Antonietta Breach, M.D.

## 2010-08-23 NOTE — Discharge Summary (Signed)
NAMEDAN, DISSINGER            ACCOUNT NO.:  [id number]   MEDICAL RECORD NO.:  [id number]          PATIENT TYPE:  IPS   LOCATION:  0301                          FACILITY:  [facility]   PHYSICIAN:  Jasmine Pang, M.D. DATE OF BIRTH:  December 24, 1980   DATE OF ADMISSION:  08/05/2006  DATE OF DISCHARGE:  08/09/2006                               DISCHARGE SUMMARY   IDENTIFYING INFORMATION:  A 30 year old single white female who was  admitted on an involuntary basis on August 05, 2006.   HISTORY OF PRESENT ILLNESS:  The patient apparently told a convenience  store clerk on the day before admission that she had taken 40 Xanax of  unknown mg size and half a pint of vodka.  They called EMS and she was  taken to the [facility] ED for evaluation of her overdose.  Her alcohol  level was 69.  Her urine drug screen was positive for cocaine and  benzodiazepines.  She states today, I do not have a drug problem.  I  have to be at work tomorrow, I want to be released.  It was felt that,  at this point, she would be detoxed by using the low-dose Librium  protocol.  She is not motivated for treatment.  She has been  noncompliant with previously prescribed medications.  Indeed, she was  just in the hospital July 23, 2006 to July 25, 2006.  She had  overdosed at that time as well.  She had relapsed on alcohol and became  severely intoxicated.  She also impulsively took a number of medications  in an overdose attempt and this was due to a reaction of a breakup with  her significant other who was another female.   At the time of admission she was quite irritable and verbally abusive  and refused to answer questions.  She was seen in consult by Dr.  Jeanie Sewer.  It was recommended that patient enter an inpatient  residential chemical dependency rehab program; however she declined.  She was no longer committable after recovering from her intoxicated  state and was allowed to be discharged on April.  16, 2008.   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  She  was an inpatient July 23, 2006 through July 25, 2006 at [facility] after an overdose.  She also overdosed March 2007.  She was  medically cleared at [facility] and sent to [facility].  Her  urine was positive for cocaine and benzodiazepines and her alcohol level  was 69.  She has no significant medical problems other than obesity.  She is not sure what she is currently prescribed or the dosages and  indicates she is noncompliant with her medications.  She has no known  drug allergies.   PHYSICAL FINDINGS:  GENERAL:  The patient was medically cleared in the  ED at [facility].  Other than the above-mentioned abnormal values, she  did not have anything that was worrisome.  LABORATORY:  Her lithium level was less than 0.25.  Her salicylate level  was for.  HOSPITAL COURSE:  Upon admission, the patient was started on Seroquel 50  mg p.o. nightly p.r.n. insomnia.  She was given Haldol 5 mg IM and 2 mg  Ativan IM for severe agitation.  She was also started on the Librium  detox protocol.   On August 06, 2006, she was restarted on her lithium carbonate 300 mg  p.o. b.i.d. for 1 day, then 300 mg in the morning and 600 mg at bedtime.  She was also given Thorazine 50 mg p.o. now for severe agitation.  This  dose was repeated later on in the evening on April 28.  She was also  given Ambien 10 mg p.o. nightly p.r.n., may repeat times 1 for insomnia.   On August 07, 2006, she stated she wanted to be on Thorazine on a regular  basis.  She was started on Thorazine 100 mg p.o. t.i.d.  Seroquel was  discontinued.  She was started on Neurontin 100 mg p.o. q.4 h. p.r.n.  anxiety.  On August 07, 2006, lithium carbonate was discontinued.  Instead, her Neurontin was increased to 200 mg p.o. q.4 h. p.r.n.  agitation/anxiety, Depakote ER 500 mg p.o. now and nightly was begun.   On August 08, 2006,  Protonix 40 mg now and then 1 p.o. q.a.m. with food  was started.  Depakote was increased to Depakote ER 500 mg q.a.m. and  nightly.  Hydroxyzine 50 mg p.o. nightly was started.  The patient  tolerated these medications well with no significant side effects.   Upon first meeting patient, she stated she was here due to an overdose,  I tried to kill myself due to a breakup with girlfriend.  She was  worried about losing her job.  There was psychomotor agitation.  Mood  was anxious and depressed, tearful when talking about her girlfriend.  She states she was drinking a lot before she came in because she was so  upset (a fifth a day and some beer).  She had a DUI and other charges.  She is on probation for 2 years for DWI.  The lithium was restarted  since she had been on this before.   On August 07, 2006, patient had not slept well.  She had middle of the  night awakening, dreaming a lot, appetite was decreased, mood was  depressed and anxious.  There was no suicidal or homicidal ideation.  No  auditory or visual hallucinations.  She stated she wanted to be  discharged from the hospital as soon as possible because she wanted to  smoke.  The lithium was discontinued.  Neurontin 100 mg p.o. q.4 h.  p.r.n. anxiety was started.  Thorazine 100 mg p.o. t.i.d. was started  due to her severe anxiety and agitation.   On August 08, 2006, patient's mood was depressed and anxious but  improving.  Affect constricted consistent with mood.  She discussed  where she is going to live when she leaves.  She wants to go to a [facility].  She stated her partner does not want to get back together with her due  to her chemical dependence.  The lithium carbonate had been discontinued  due to severe agitation and Depakote ER 500 mg b.i.d. was started.  Neurontin was increased to 200 mg q.4 h. p.r.n. anxiety.  Thorazine 100  mg p.o. t.i.d. was continued.  On Aug 09, 2006, mental status had improved.  Patient was less  depressed,  less anxious.  Affect was wider range.  There was  no suicidal or  homicidal ideation.  No auditory or visual hallucinations.  No thoughts  of self-injurious behavior.  No paranoia or delusions.  Thoughts were  logical and goal-directed.  Thought content:  No predominant theme.  Cognitive grossly back to baseline.  It was felt she was safe to be  discharged today.   DISCHARGE DIAGNOSES:  AXIS I:  Mood disorder not otherwise specified and  also polysubstance dependence.  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  AXIS III: Obesity.  AXIS IV: Severe (she currently has legal charges and is under the care  for a probation officer for hit-and-run, possession and selling drugs).  AXIS V: GAF at discharge was 60.  GAF upon admission was 55.  GAF  highest past year was 65.   DISCHARGE PLANS:  There were no specific activity level or dietary  restrictions.   DISCHARGE MEDICATIONS:  1. Depakote ER 1000 mg at bedtime.  2. Vistaril 50-100 mg at bedtime.  3. Thorazine 100 mg t.i.d.  4. Neurontin 200 mg every 4 hours if needed for anxiety.   POST HOSPITAL CARE PLANS:  The patient will see Dr. Micheline Maze at [facility] on Friday, May 9 and Beryle Lathe, counselor, on Thursday, May  8.      Jasmine Pang, M.D.  Electronically Signed     BHS/MEDQ  D:  08/16/2006  T:  08/16/2006  Job:  [id number]
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
## 2010-08-26 NOTE — H&P (Signed)
[name], [name]            ACCOUNT NO.:  [id number]   MEDICAL RECORD NO.:  [id number]          PATIENT TYPE:  EMS   LOCATION:  ED                           FACILITY:  [facility]   PHYSICIAN:  Denise L. Ladona Ridgel, MD  DATE OF BIRTH:  Jul 25, 1980   DATE OF ADMISSION:  07/03/2005  DATE OF DISCHARGE:                                HISTORY & PHYSICAL   CHIEF COMPLAINT:  Overdose.   PRIMARY CARE PHYSICIAN:  Unassigned.   HISTORY OF PRESENT ILLNESS:  The patient is a 30 year old white female with  a past medical history for ethanol and drug abuse and bipolar disease who  was out partying with friends last night.  She evidently was seen by her  family initially and was found to be acting bizarre but she took off and  went out to party with friends.  Around 4 a.m., her friends called to say  that she had taken a bunch of pills.  The patient had access to Seroquel,  Klonopin, lithium, and Lamictal.  Her urine drug screen also showed  positivity for cocaine and marijuana.  The patient was recently discharged  from [facility] on Monday of last week and was scheduled to see a new  psychiatrist today according to her mother.   REVIEW OF SYSTEMS:  Not able to be obtained secondary to the patient being  unresponsive.   PAST MEDICAL HISTORY:  Bipolar disease.   PAST SURGICAL HISTORY:  None.   SOCIAL HISTORY:  She smokes a couple of packs of cigarettes a day.  She  drinks alcohol.  She works at [employer] on occasion.   FAMILY HISTORY:  Mom is living.  Dad is living.  Both have nerve problems  and are disabled from them.   ALLERGIES:  No known drug allergies, although the patient's mother reports  that at a previous hospital, she did have an allergy reported; it is unknown  what this is at this time.   MEDICATIONS:  Medications and dosing are unclear at this time.  However, she  did have access to Klonopin which was not hers, Lamictal which she does take  on a regular basis, Seroquel, and there  is a question of whether she takes  lithium, although her mother did not give that to me as a drug that she uses  regularly.   PHYSICAL EXAMINATION:  VITAL SIGNS:  Temperature 97, blood pressure 126/67,  pulse 112, respirations 24, saturation 100%.  GENERAL:  Minimally arousable with very noxious stimuli like Foley catheter  insertion or NG tube insertion.  HEENT:  Normocephalic, atraumatic.  Pupils equal, round, but very sluggish  to light.  Mucous membranes are moist.  NG tube is intact.  CHEST:  Decreased breath sounds with occasional rhonchi.  CARDIOVASCULAR:  Tachy with positive S1 and S2.  No S3 or S4.  ABDOMEN:  Soft, nontender, at least with no response to palpation and  nondistended.  EXTREMITIES:  No clubbing, cyanosis, or edema.  NEUROLOGIC:  She is not responsive to stimuli, vocal or moderately noxious.  She does have some purposeless movement with extreme stimuli.  LABORATORY DATA:  White count 7.3, hemoglobin 12.3, hematocrit 36.7,  platelets 375.  Sodium 136, potassium 3.2, chloride 108, CO2 21, BUN 13,  creatinine 0.9.  Glucose 142.  Urine pregnancy is negative.  Urine drug  screen is positive for cocaine, benzodiazepine, barbiturates, and marijuana.  Her tricyclics are not detectable.  She does have a lithium level of 0.39.  Alcohol level is less than 5.  Salicylate level less than 4.  Tylenol level  is less than 10.   ASSESSMENT AND PLAN:  This is a 30 year old white female with an overdose of  possibly multiple agents, the most important being Seroquel.   Problem 1. Pulmonary, respiratory, and cognitively depressed.  She will need  intubation.  Her tidal volume should be 700, respiratory rate 12, 100%, PEEP  5.  We will obtain a blood gas post intubation.  Check a chest x-ray for  tube placement.  She ultimately may need coverage for aspiration.   Problem 2. Cardiovascular.  She is tachycardic with mildly low blood  pressure.  We will use normal saline for  volume resuscitation as her blood  pressure may become decreased on intubation.  We will check an EKG to  evaluate her QT prolongation.   Problem 3. Gastrointestinal.  NG tube is to low intermittent suction.  We  will start her on PPI IV.  She will be n.p.o.   Problem 4. Genitourinary.  Foley to gravity.  Will follow her urine outputs.   Problem 5. Endocrine.  She will be n.p.o.  Will cover her with D5 normal  saline when she is volume resuscitated.   Problem 6. Deep venous thrombosis prophylaxis.  Will be with Lovenox.      Denise L. Ladona Ridgel, MD  Electronically Signed     MLT/MEDQ  D:  07/03/2005  T:  07/03/2005  Job:  [id number]

## 2010-08-26 NOTE — Discharge Summary (Signed)
NAMELAURAINE, [name]            ACCOUNT NO.:  [id number]   MEDICAL RECORD NO.:  [id number]          PATIENT TYPE:  IPS   LOCATION:  0600                          FACILITY:  [facility]   PHYSICIAN:  Anselm Jungling, MD  DATE OF BIRTH:  September 13, 1980   DATE OF ADMISSION:  08/27/2006  DATE OF DISCHARGE:  08/31/2006                               DISCHARGE SUMMARY   IDENTIFYING DATA/REASON FOR ADMISSION:  This was an inpatient  psychiatric admission for [name], a 30 year old female who was  admitted after overdosing on Celexa, lithium, Depakote and Xanax on Aug 25, 2006.  She had also been using cocaine heavily.  The precipitant for  her suicide attempt was an argument between herself and an ex-girlfriend  on the phone.  The patient also admitted to drinking a 12-pack of beer  daily.  She had had a recent admission to our facility, and multiple  prior psychiatric admissions elsewhere.  Please refer to the admission  note for further details pertaining to the symptoms, circumstances and  history that led to her hospitalization.   INITIAL DIAGNOSTIC IMPRESSION:  She was given initial AXIS I diagnoses  of polysubstance dependence, and history of mood disorder not otherwise  specified.   MEDICAL/LABORATORY:  The patient was treated for her overdose and then  sent to [place] for further psychiatric treatment.  She was medically and  physically assessed by the psychiatric nurse practitioner.  There were  no acute or chronic medical issues addressed during this inpatient stay.   HOSPITAL COURSE:  The patient was admitted to the adult inpatient  psychiatric service.  She presented as a well-nourished, well-developed  woman who was alert and fully oriented.  Her mood was depressed with  flat affect, but she was open and verbal.  She characterized her  difficulties as being based upon not being able to get her prescriptions  filled, with their subsequent discontinuation, and the loss of the  relationship with her girlfriend.  She minimized the role of her  immediate relapse following her last hospital discharge, but later  admitted that she did in fact need to get clean and sober and remained  that way.   The patient was started on her previous psychotropics, including  Depakote, Celexa, Thorazine, and Neurontin.  She was involved in  therapeutic groups and activities, and was a somewhat inconsistent  participant in the treatment program, sometimes preferring to stay in  bed rather than attending groups.  Sometimes, she reported pain as a  reason for staying in bed although there was nothing to indicate any  underlying medical problem.  She reported suicidal ideation during her  stay in an inconsistent fashion as well.  However, she contracted for  safety throughout and made no attempts to harm herself.   She worked closely with the casemanager on placement and aftercare  issues.   By the fifth hospital day, the patient indicated that she did feel ready  for discharge and was agreeable to the following aftercare plan.   AFTERCARE:  The patient was to follow up at the [facility] with  their psychiatrist on  Sep 06, 2006, and with Cleda Clarks for individual  sessions on September 10, 2006.   DISCHARGE MEDICATIONS:  1. Depakote ER 1000 mg q.h.s.  2. Celexa 20 mg daily.  3. Thorazine 100 mg t.i.d.  4. Neurontin 200 mg t.i.d.   DISCHARGE DIAGNOSES:  AXIS I:  Bipolar disorder not otherwise specified.  AXIS II:  Deferred.  AXIS III:  No acute or chronic illnesses.  AXIS IV:  Stressors:  Severe.  AXIS V:  GAF on discharge 60.      Anselm Jungling, MD  Electronically Signed     SPB/MEDQ  D:  09/10/2006  T:  09/10/2006  Job:  [id number]

## 2010-08-26 NOTE — Consult Note (Signed)
NAMEJENNENE, DOWNIE            ACCOUNT NO.:  [id number]   MEDICAL RECORD NO.:  [id number]          PATIENT TYPE:  INP   LOCATION:  6705                         FACILITY:  [facility]   PHYSICIAN:  Antonietta Breach, M.D.  DATE OF BIRTH:  01-Jun-1980   DATE OF CONSULTATION:  07/25/2006  DATE OF DISCHARGE:  07/25/2006                                 CONSULTATION   REASON FOR CONSULTATION:  Overdose.   HISTORY OF PRESENT ILLNESS:  Ms. Tonika Eden is a 30 year old  female admitted to [facility] on July 23, 2006, after an overdose.   The patient relapsed on alcohol and became severely intoxicated.  She  impulsively took a number of medications in an overdose.  She had been  reacting to a breakup with her significant other.  At the time of the  assessment during admission she was still irritable and verbally  abusive.  She was refusing to answer questions.   By the time of the undersigned's assessment, she is no longer alcohol  intoxicated.  Her judgment has returned to normal.  She has a  constructive regret about relapsing and becoming intoxicated.  She  realizes that the alcohol impaired her judgment and increased her  impulsivity.  She denies any thoughts of harming herself or others.  She  denies any hallucinations or delusions.  She states that she has been  making progress in her psychotherapy which has been occurring once per  week.  She also has her psychotropic medication regimen managed by a  psychiatrist.  She has been taking her psychotropic medications.  These  have included lithium, Seroquel, Prozac, and Lamictal.   PAST PSYCHIATRIC HISTORY:  The patient does have a history of overdosing  while on illegal drugs and alcohol in March 2007.  She was medically  cleared in the emergency room of [facility] and transferred to [facility] after being referred by her alcohol and drug abuse  counselor.  After discharge from [facility], she was intoxicated with  cocaine and marijuana in late March 2007.  She began acting in a bizarre  fashion and overdosed on a number of medications.   The patient does have a history of severe mood swings treated with  lithium, Seroquel and Lamictal.  She also has been on Prozac for anti-  depression.   FAMILY PSYCHIATRIC HISTORY:  None known.   SOCIAL HISTORY:  The patient is single and has no children.  She  recently had a breakup with her significant other.  She states that has  not bothered her when she is not intoxicated.  She has been residing  with her parents.  Her drug screen was negative on this admission.  Occupation: [employer].   GENERAL MEDICAL PROBLEMS:  Status post polysubstance overdose.   MEDICATIONS:  MAR is reviewed.  The patient is not currently on  psychotropics.  Her CBC is unremarkable.  Her chemistry panel is  unremarkable.  Her TSH was normal.  Her urine pregnancy test was  negative.  Her lithium level was initially 1.02.  Her urine drug screen  was negative.  Her alcohol level  was still 142 after being assessed in  the emergency room.   REVIEW OF SYSTEMS:  Noncontributory.   VITAL SIGNS:  Are stable.   MENTAL STATUS EXAM:  Ms. Floor is an alert young female, socially  appropriate, oriented to all spheres with good eye contact.  She is  polite.  Her thought process is logical, coherent, goal-directed without  looseness of associations.  Thought content: No thoughts of harming  herself, no thoughts of harming others, no delusions, no hallucinations.  Please see the history of present illness regarding the patient's  constructive regret.  She is socially appropriate and cooperative with  the interview.  Her fund of knowledge and intelligence are within normal  limits.  She is oriented to all spheres.  Her memory is intact to  immediate, recent and remote except for the blackout from the alcohol  intoxication and substance overdose.  Her speech involves normal rate  and prosody.   Her insight and judgment have returned to normal.  She is  aware of her severe chemical dependency problem and how it undermines  her mood stability.   However, she declines psychiatric chemical dependency inpatient  treatment.  She declines a residential CD program.   ASSESSMENT:  AXIS I:  1. (296.80) Bipolar disorder not otherwise specified, stable.  2. Alcohol dependence, rule out cocaine dependence, cannabis abuse,      polysubstance dependence.   AXIS II:  Deferred.   AXIS III:  See general medical problems.   AXIS IV:  Primary support group.   AXIS V:  55 currently.   Due to the patient's repeated pattern of relapse combined with overdose,  the undersigned emphatically recommended that the patient enter an  inpatient residential chemical dependency rehab program.  However, the  patient declined and she is no longer committable after recovering from  her intoxicated state.   She states that she is motivated to continue her weekly psychotherapy  recovery groups and her psychotropic medication management.  She states  that she has made progress with her psychotherapy.   She does agree to call emergency services immediately for any thoughts  of harming herself, thoughts of harming others or distress.   The patient will continue her psychotropic outpatient regimen which has  been effective when she does not abuse substances.  She will participate  in 12-step groups and her psychotherapy.      Antonietta Breach, M.D.  Electronically Signed     JW/MEDQ  D:  07/28/2006  T:  07/29/2006  Job:  [id number]

## 2010-08-26 NOTE — Discharge Summary (Signed)
[name], [name]            ACCOUNT NO.:  [id number]   MEDICAL RECORD NO.:  [id number]          PATIENT TYPE:  INP   LOCATION:  5715                         FACILITY:  [facility]   PHYSICIAN:  Melissa L. Ladona Ridgel, MD  DATE OF BIRTH:  September 17, 1980   DATE OF ADMISSION:  07/03/2005  DATE OF DISCHARGE:  07/06/2005                                 DISCHARGE SUMMARY   CHIEF COMPLAINT:  Unresponsive.   DISCHARGE DIAGNOSES:  1.  Unintentional overdose of multiple medications.  Patient has a history      of bipolar disease and was out drinking and doing cocaine when she      decided to take multiple medications that she uses for her bipolar      disease.  She came to the emergency room, was initially noted to be      awake and alert; however, her mental status deteriorated to the point      where she needed to be intubated for further care.  Prior to the      intubation the patient vomited and likely aspirated.  The patient was      intubated, transferred to [facility] for further care in the      intensive care area.  She was initially started on Zosyn for aspiration      pneumonia.  This was discontinued by the critical care team.      Subsequently, she was extubated within 24 hours.  She did show signs and      symptoms of fever with cough and therefore her Zosyn was resumed.  At      the time of discharge the patient had no wheezing.  Her lungs were much      clearer.  She did have an occasional cough, but was able to ambulate in      the hallways without dyspnea and without decrease in her saturations.      She was discharged to home on Flagyl 500 mg q.6h. for 11 more days and      Augmentin 875 twice daily for 11 more days.  Case management made      arrangements for her to have three days of medication supply.  Prior to      leaving the hospital the patient did complain of some shortness of      breath.  The on-call physician was called.  Prior to him being able to      see her  she left the hospital.  I will follow up this a.m. with her to      see if she needs an inhaler or if this was an isolated event possibly      related to anxiety.  2.  Polysubstance abuse.  The patient was counseled on the use of cocaine,      alcohol, and cigarettes.  At this time the patient is in denial      regarding modes and methods for rehabilitating herself to most      questions.  With regard to what plan she had to rehabilitation she  stated I do not know.  We directed her to [facility].  She was reevaluated      prior to discharge by psychiatry and found not to be suicidal nor did      she attempt suicide in the beginning of this; this was an impulsive      attempt to get a high.  Patient has marginal insight into her illness      and refuses therapy as an inpatient which is recommended.  3.  Bipolar disease.  At this time the patient's disease appears to be      stable.  However, she was directed to her primary therapist in order to      obtain medications for her disease.  I did not discharge her with any      Seroquel or benzodiazepines or Lithium.  She was instructed to see her      physician today, the day following discharge.   DISCHARGE MEDICATIONS:  1.  Flagyl 500 mg every six hours for 11 days.  2.  Augmentin 875 mg twice daily for 11 days.  She was instructed not to      drink with the Flagyl as it would make her vomit.   She was instructed to follow up with her therapist as soon as she was  released from her hospital in order to stabilize her bipolar disease.  I  will call the patient in the a.m. regarding her brief episode of shortness  of breath and determine whether she needs an inhaler or not.  The patient  was also given a referral to [facility] to follow up with her pneumonia.   HISTORY OF PRESENT ILLNESS:  The patient is a 30 year old white female with  a known past medical history for polysubstance abuse including cocaine,  alcohol, cigarettes, and other  agents which were not revealed during this  hospital stay.  Patient evidently was noted to be behaving bizarrely by her  family.  When they attempted to detain her for treatment she fled to her  friend's home.  There she proceeded to drink and took a handful of mixed  pills including her Seroquel, Lamictal, and possible Lithium.  She was  brought to the emergency room after her friend called her family to say that  she had done this.  In the emergency room the patient was initially awake  and alert; however, her mental status deteriorated after the medications  took effect.  She was monitored for other ingestions and found not to have  an elevated Lithium level or elevated Tylenol or salicylate level.  She  subsequently required intubation when her breathing became more agonal.  She  was paralyzed, intubated, and placed in the ICU for further evaluation.  Initially, the patient vomited in the emergency room and because of this was  placed on Zosyn.  The Zosyn was subsequently discontinued after critical  care consultants evaluated her.  She was transferred to [facility]  and within 24 hours was extubated.  She subsequently developed temperature  and exhibited a wet cough.  Her Zosyn was therefore discontinued because of  likelihood of aspiration.  Patient was maintained in three days of IV Zosyn  with good effects.  Her cough significantly decreased.  She had no further  rhonchi.  Her saturations were appropriate when ambulating at 95% on the day  of discharge.   The patient evidently had an altercation with her family the night before  discharge.  She was, however,  compliant and pleasant during her stay with  [place].  On the day of discharge the patient was reevaluated by psychiatry.  It  was determined that she was not suicidal, nor had she been when she took her  overdose.  The patient was simply trying to get high.  The patient was instructed regarding her need to take control of  her life and seek help for  her addictions.  She had no committable behavior and therefore with medical  clearance was stable for discharge.   Prior to discharge the patient complained of some shortness of breath.  We  were contacted and subsequently the patient left the hospital without  addressing the issue.  I will therefore give her a call this morning to  assure that she is doing okay and consider adding any metered dose inhalers  which might help with bronchospasm.  Patient was instructed not to smoke,  not to drink, and especially not to drink on the Flagyl as this could cause  vomiting.  On the day of discharge the patient's vital signs were stable.  She was afebrile.  Her white count had trended downward.  She was  normocephalic, atraumatic.  Pupils are equal, round, and reactive to light.  Extraocular movements were intact.  Mucous membranes were moist.  Her neck  was supple.  There was no JVD, no lymph nodes, and no carotid bruits.  Her  chest was clear to auscultation with no rhonchi, rales, or wheezes.  Cardiovascular is regular rate and rhythm.  Positive S1, S2.  No S3, S4.  No  murmurs, rubs, or gallops.  Abdomen is soft, nontender, nondistended, but  obese.  Extremities show no clubbing, cyanosis, edema.  Neurologic:  Patient  is awake, alert, oriented.  Cranial nerves II-XII are intact.  Power 5/5.  DTRs are 2+.   PERTINENT LABORATORIES ON THE DAY OF DISCHARGE:  Her white count had  decreased significantly down to 11.  She had no fever.  Her BUN and  creatinine were stable at 6 and 0.7.  She was tolerating oral medications.  Other studies during the course of the hospital stay revealed TSH of 0.943  which is within normal limits.  Her urine pregnancy test was negative.  Her  Lithium level was 0.39.  Salicylates were negative.  Tylenol levels were  negative.   At this time the patient is referred to [facility] for further follow-up of  her pneumonia.  I will contact  her today to assure that she has no further  shortness of breath.  Her condition at discharge, again, was stable.      Melissa L. Ladona Ridgel, MD  Electronically Signed     MLT/MEDQ  D:  07/07/2005  T:  07/07/2005  Job:  [id number]

## 2010-11-26 ENCOUNTER — Emergency Department (HOSPITAL_COMMUNITY)
Admission: EM | Admit: 2010-11-26 | Discharge: 2010-11-26 | Disposition: A | Discharge status: Other Acute Inpatient Hospital | Payer: Self-pay | Attending: Emergency Medicine | Admitting: Emergency Medicine

## 2010-11-26 ENCOUNTER — Inpatient Hospital Stay (HOSPITAL_COMMUNITY)
Admission: RE | Admit: 2010-11-26 | Discharge: 2010-11-28 | DRG: 885 | Disposition: A | Discharge status: Home / Self Care | Payer: PRIVATE HEALTH INSURANCE | Source: Ambulatory Visit | Attending: Psychiatry | Admitting: Psychiatry

## 2010-11-26 DIAGNOSIS — M549 Dorsalgia, unspecified: Secondary | ICD-10-CM

## 2010-11-26 DIAGNOSIS — G8929 Other chronic pain: Secondary | ICD-10-CM

## 2010-11-26 DIAGNOSIS — E663 Overweight: Secondary | ICD-10-CM

## 2010-11-26 DIAGNOSIS — IMO0002 Reserved for concepts with insufficient information to code with codable children: Secondary | ICD-10-CM | POA: Insufficient documentation

## 2010-11-26 DIAGNOSIS — F1320 Sedative, hypnotic or anxiolytic dependence, uncomplicated: Secondary | ICD-10-CM

## 2010-11-26 DIAGNOSIS — K219 Gastro-esophageal reflux disease without esophagitis: Secondary | ICD-10-CM

## 2010-11-26 DIAGNOSIS — F1210 Cannabis abuse, uncomplicated: Secondary | ICD-10-CM

## 2010-11-26 DIAGNOSIS — F319 Bipolar disorder, unspecified: Principal | ICD-10-CM

## 2010-11-26 DIAGNOSIS — F1020 Alcohol dependence, uncomplicated: Secondary | ICD-10-CM

## 2010-11-26 DIAGNOSIS — R45851 Suicidal ideations: Secondary | ICD-10-CM

## 2010-11-26 DIAGNOSIS — F3289 Other specified depressive episodes: Secondary | ICD-10-CM | POA: Insufficient documentation

## 2010-11-26 DIAGNOSIS — F329 Major depressive disorder, single episode, unspecified: Secondary | ICD-10-CM | POA: Insufficient documentation

## 2010-11-26 DIAGNOSIS — R000 Tachycardia, unspecified: Secondary | ICD-10-CM | POA: Insufficient documentation

## 2010-11-26 DIAGNOSIS — F6089 Other specific personality disorders: Secondary | ICD-10-CM

## 2010-11-26 LAB — CBC
HCT: 39.3 % (ref 36.0–46.0)
Hemoglobin: 13.7 g/dL (ref 12.0–15.0)
MCH: 32.8 pg (ref 26.0–34.0)
MCHC: 34.9 g/dL (ref 30.0–36.0)
MCV: 94 fL (ref 78.0–100.0)
RBC: 4.18 MIL/uL (ref 3.87–5.11)

## 2010-11-26 LAB — DIFFERENTIAL
Lymphocytes Relative: 30 % (ref 12–46)
Lymphs Abs: 2 10*3/uL (ref 0.7–4.0)
Monocytes Absolute: 0.3 10*3/uL (ref 0.1–1.0)
Monocytes Relative: 5 % (ref 3–12)
Neutro Abs: 4.1 10*3/uL (ref 1.7–7.7)
Neutrophils Relative %: 64 % (ref 43–77)

## 2010-11-26 LAB — POCT PREGNANCY, URINE: Preg Test, Ur: NEGATIVE

## 2010-11-26 LAB — COMPREHENSIVE METABOLIC PANEL
Albumin: 4.2 g/dL (ref 3.5–5.2)
Alkaline Phosphatase: 80 U/L (ref 39–117)
BUN: 7 mg/dL (ref 6–23)
CO2: 14 mEq/L — ABNORMAL LOW (ref 19–32)
Chloride: 106 mEq/L (ref 96–112)
Creatinine, Ser: 0.64 mg/dL (ref 0.50–1.10)
GFR calc non Af Amer: >60 mL/min (ref >60)
Glucose, Bld: 134 mg/dL — ABNORMAL HIGH (ref 70–99)
Potassium: 3 mEq/L — ABNORMAL LOW (ref 3.5–5.1)
Total Bilirubin: 0.1 mg/dL — ABNORMAL LOW (ref 0.3–1.2)

## 2010-11-26 LAB — ACETAMINOPHEN LEVEL: Acetaminophen (Tylenol), Serum: <15 ug/mL (ref 10–30)

## 2010-11-26 LAB — RAPID URINE DRUG SCREEN, HOSP PERFORMED
Amphetamines: NOT DETECTED
Opiates: NOT DETECTED

## 2010-11-26 LAB — TSH: TSH: 1.499 u[IU]/mL (ref 0.350–4.500)

## 2010-11-26 LAB — ETHANOL: Alcohol, Ethyl (B): 53 mg/dL — ABNORMAL HIGH (ref 0–11)

## 2010-11-27 DIAGNOSIS — F3132 Bipolar disorder, current episode depressed, moderate: Secondary | ICD-10-CM

## 2010-11-30 NOTE — Assessment & Plan Note (Signed)
NAMEERCELL, PERLMAN            ACCOUNT NO.:  [id number]  MEDICAL RECORD NO.:  [id number]  LOCATION:  0300                          [facility]ACILITY:  [facility]  PHYSICIAN:  Eulogio Ditch, MD DATE OF BIRTH:  02-Jul-1980  DATE OF ADMISSION:  11/26/2010 DATE OF DISCHARGE:                      PSYCHIATRIC ADMISSION ASSESSMENT   IDENTIFYING INFORMATION:  A 30 year old female, single.  This is a voluntary admission.  HISTORY OF PRESENT ILLNESS:  This is one of several [facility] admissions for [facility] who has a history of mood disorder and borderline personality features.  She presents with suicidal thoughts and a plan to overdose on medications.  She says that she and her roommate, who have a longstanding relationship, have been arguing and fighting for the last couple of months.  This is exacerbated by their chronic financial problems and trying to meet rent and meet basic expenses.  Bulah has continued to work at her [employer] job but is barely making it financially.  She is not allowed to drive due to a history of two previous DWIs, so she relies on her housemate for transportation.  She gradually relapsed on Klonopin and alcohol and reports that she has been drinking somewhere between 6 and 8 beers daily and a couple times a week will also take Klonopin or another benzodiazepine.  She was discharged in January from our unit and did no follow-up whatsoever.  She is irritable and anxious today and endorsing suicidal thoughts.  PAST PSYCHIATRIC HISTORY: 1. She has a history of mood disorder NOS. 2. Chronic alcohol and benzodiazepine dependence. 3. History of borderline personality features and no current     outpatient treatment.  SOCIAL HISTORY:  This is a single Caucasian female who has lived in a 98- year relationship with her roommate.  They had a close personal relationship and now are existing in the same household as friends and sharing expenses.  Her roommate objects to Azhia's  use of alcohol, marijuana and benzodiazepines which further fuels their chronic conflict.  FAMILY HISTORY:  Not available.  PAST MEDICAL HISTORY:  No regular primary care provider.  Medical problems:  Chronic medical problems are none.  Past medical history significant for a history of GERD.  CURRENT MEDICATIONS:  None.  ALLERGIES:  None.  PHYSICAL EXAMINATION:  Physical exam was done in the emergency room and is noted in the record.  This is an overweight Caucasian female 97 kg, 5 feet, 2 inches tall.  Vital signs are normal except for some mild tachycardia.  LABORATORIES:  CBC normal with a hemoglobin of 13.7.  Chemistries: Sodium 138, potassium 3.5, chloride 106, carbon dioxide 14, BUN 7, creatinine 0.64 and random glucose 134.  Liver enzymes are normal.  TSH 1.499.  Urine drug screen positive for cannabis metabolites.  Alcohol level 53.  MENTAL STATUS EXAM:  Fully alert female with irritable affect, mildly anxious, wanting to be sedated.  She complains that she is only "hanging on by my fingernails."  Feels she is going to "lose it."  Mood irritable, anxious.  Thinking logical.  She does not appear internally distracted, nonpsychotic.  Insight superficial.  Judgment poor.  Impulse control poor.  DIAGNOSES:  Axis I: 1. Mood disorder NOS. 2. Alcohol dependence.  3. Benzodiazepine dependence. 4. Cannabis abuse. Axis II:  Personality disorder NOS with borderline features. Axis III:  No diagnosis. Axis IV:  Chronic domestic conflict and financial stressors and social issues. Axis V:  Current GAF 38, past year not known.  PLAN:  The plan is to voluntarily admit her with a goal of a safe detox. We started her on Ativan 1 mg q.i.d. today and 1 mg q.6 h p.r.n. for withdrawal symptoms, Tegretol 200 mg b.i.d. and Seroquel 200 mg t.i.d. and 100 mg q.i.d. p.r.n. for agitation.     Margaret A. Lorin Picket, N.P.   ______________________________ Eulogio Ditch,  MD    MAS/MEDQ  D:  11/27/2010  T:  11/27/2010  Job:  [id number]  Electronically Signed by Kari Baars N.P. on 11/29/2010 10:20:30 AM Electronically Signed by Eulogio Ditch  on 11/30/2010 08:49:44 AM

## 2010-12-05 ENCOUNTER — Emergency Department (HOSPITAL_COMMUNITY)
Admission: EM | Admit: 2010-12-05 | Discharge: 2010-12-10 | Disposition: A | Discharge status: Other Acute Inpatient Hospital | Payer: Self-pay | Attending: Emergency Medicine | Admitting: Emergency Medicine

## 2010-12-05 DIAGNOSIS — T50901A Poisoning by unspecified drugs, medicaments and biological substances, accidental (unintentional), initial encounter: Secondary | ICD-10-CM | POA: Insufficient documentation

## 2010-12-05 DIAGNOSIS — I498 Other specified cardiac arrhythmias: Secondary | ICD-10-CM | POA: Insufficient documentation

## 2010-12-05 DIAGNOSIS — F1010 Alcohol abuse, uncomplicated: Secondary | ICD-10-CM | POA: Insufficient documentation

## 2010-12-05 DIAGNOSIS — IMO0002 Reserved for concepts with insufficient information to code with codable children: Secondary | ICD-10-CM | POA: Insufficient documentation

## 2010-12-05 DIAGNOSIS — T50902A Poisoning by unspecified drugs, medicaments and biological substances, intentional self-harm, initial encounter: Secondary | ICD-10-CM | POA: Insufficient documentation

## 2010-12-05 LAB — URINALYSIS, ROUTINE W REFLEX MICROSCOPIC
Bilirubin Urine: NEGATIVE
Glucose, UA: NEGATIVE mg/dL
Hgb urine dipstick: NEGATIVE
Ketones, ur: NEGATIVE mg/dL
Leukocytes, UA: NEGATIVE
Nitrite: NEGATIVE
Protein, ur: NEGATIVE mg/dL
Specific Gravity, Urine: 1.005 (ref 1.005–1.030)
Urobilinogen, UA: 0.2 mg/dL (ref 0.0–1.0)
pH: 6 (ref 5.0–8.0)

## 2010-12-05 LAB — PREGNANCY, URINE: Preg Test, Ur: NEGATIVE

## 2010-12-06 LAB — COMPREHENSIVE METABOLIC PANEL
Alkaline Phosphatase: 71 U/L (ref 39–117)
BUN: 7 mg/dL (ref 6–23)
Calcium: 9.4 mg/dL (ref 8.4–10.5)
Creatinine, Ser: 0.52 mg/dL (ref 0.50–1.10)
GFR calc Af Amer: >60 mL/min (ref >60)
Glucose, Bld: 96 mg/dL (ref 70–99)
Total Protein: 7.4 g/dL (ref 6.0–8.3)

## 2010-12-06 LAB — SALICYLATE LEVEL: Salicylate Lvl: <2 mg/dL — ABNORMAL LOW (ref 2.8–20.0)

## 2010-12-06 LAB — DIFFERENTIAL
Eosinophils Absolute: 0.1 10*3/uL (ref 0.0–0.7)
Eosinophils Relative: 1 % (ref 0–5)
Lymphocytes Relative: 25 % (ref 12–46)
Lymphs Abs: 1.6 10*3/uL (ref 0.7–4.0)
Monocytes Absolute: 0.4 10*3/uL (ref 0.1–1.0)

## 2010-12-06 LAB — CBC
HCT: 37.1 % (ref 36.0–46.0)
MCHC: 34.8 g/dL (ref 30.0–36.0)
MCV: 94.4 fL (ref 78.0–100.0)
RDW: 12.9 % (ref 11.5–15.5)

## 2010-12-06 LAB — RAPID URINE DRUG SCREEN, HOSP PERFORMED
Benzodiazepines: NOT DETECTED
Cocaine: NOT DETECTED
Opiates: NOT DETECTED
Tetrahydrocannabinol: POSITIVE — AB

## 2010-12-06 LAB — ETHANOL: Alcohol, Ethyl (B): 77 mg/dL — ABNORMAL HIGH (ref 0–11)

## 2010-12-27 ENCOUNTER — Emergency Department (HOSPITAL_COMMUNITY)
Admission: EM | Admit: 2010-12-27 | Discharge: 2010-12-29 | Disposition: A | Discharge status: Other Acute Inpatient Hospital | Payer: Self-pay | Attending: Emergency Medicine | Admitting: Emergency Medicine

## 2010-12-27 DIAGNOSIS — F3289 Other specified depressive episodes: Secondary | ICD-10-CM | POA: Insufficient documentation

## 2010-12-27 DIAGNOSIS — F329 Major depressive disorder, single episode, unspecified: Secondary | ICD-10-CM | POA: Insufficient documentation

## 2010-12-27 DIAGNOSIS — T50901A Poisoning by unspecified drugs, medicaments and biological substances, accidental (unintentional), initial encounter: Secondary | ICD-10-CM | POA: Insufficient documentation

## 2010-12-27 DIAGNOSIS — R000 Tachycardia, unspecified: Secondary | ICD-10-CM | POA: Insufficient documentation

## 2010-12-27 DIAGNOSIS — R404 Transient alteration of awareness: Secondary | ICD-10-CM | POA: Insufficient documentation

## 2010-12-27 DIAGNOSIS — T50902A Poisoning by unspecified drugs, medicaments and biological substances, intentional self-harm, initial encounter: Secondary | ICD-10-CM | POA: Insufficient documentation

## 2010-12-28 LAB — COMPREHENSIVE METABOLIC PANEL
ALT: 16 U/L (ref 0–35)
Alkaline Phosphatase: 77 U/L (ref 39–117)
BUN: 6 mg/dL (ref 6–23)
CO2: 20 mEq/L (ref 19–32)
GFR calc Af Amer: >60 mL/min (ref >60)
GFR calc non Af Amer: >60 mL/min (ref >60)
Glucose, Bld: 103 mg/dL — ABNORMAL HIGH (ref 70–99)
Potassium: 3.5 mEq/L (ref 3.5–5.1)
Sodium: 137 mEq/L (ref 135–145)
Total Protein: 7.1 g/dL (ref 6.0–8.3)

## 2010-12-28 LAB — URINALYSIS, ROUTINE W REFLEX MICROSCOPIC
Glucose, UA: NEGATIVE mg/dL
Ketones, ur: NEGATIVE mg/dL
Leukocytes, UA: NEGATIVE
Protein, ur: NEGATIVE mg/dL
Urobilinogen, UA: 0.2 mg/dL (ref 0.0–1.0)

## 2010-12-28 LAB — CBC
MCHC: 34.6 g/dL (ref 30.0–36.0)
Platelets: 364 10*3/uL (ref 150–400)
RDW: 12.8 % (ref 11.5–15.5)
WBC: 5.9 10*3/uL (ref 4.0–10.5)

## 2010-12-28 LAB — DIFFERENTIAL
Basophils Absolute: 0.1 10*3/uL (ref 0.0–0.1)
Basophils Relative: 1 % (ref 0–1)
Eosinophils Absolute: 0 10*3/uL (ref 0.0–0.7)
Eosinophils Relative: 1 % (ref 0–5)
Monocytes Absolute: 0.4 10*3/uL (ref 0.1–1.0)

## 2010-12-28 LAB — ETHANOL: Alcohol, Ethyl (B): 97 mg/dL — ABNORMAL HIGH (ref 0–11)

## 2010-12-28 LAB — URINE MICROSCOPIC-ADD ON

## 2010-12-28 LAB — RAPID URINE DRUG SCREEN, HOSP PERFORMED
Amphetamines: NOT DETECTED
Benzodiazepines: NOT DETECTED
Tetrahydrocannabinol: POSITIVE — AB

## 2010-12-28 LAB — ACETAMINOPHEN LEVEL: Acetaminophen (Tylenol), Serum: <15 ug/mL (ref 10–30)

## 2010-12-29 ENCOUNTER — Inpatient Hospital Stay (HOSPITAL_COMMUNITY)
Admission: AD | Admit: 2010-12-29 | Discharge: 2011-01-04 | DRG: 897 | Disposition: A | Discharge status: Home / Self Care | Payer: PRIVATE HEALTH INSURANCE | Attending: Psychiatry | Admitting: Psychiatry

## 2010-12-29 DIAGNOSIS — G8929 Other chronic pain: Secondary | ICD-10-CM

## 2010-12-29 DIAGNOSIS — F1320 Sedative, hypnotic or anxiolytic dependence, uncomplicated: Secondary | ICD-10-CM

## 2010-12-29 DIAGNOSIS — K219 Gastro-esophageal reflux disease without esophagitis: Secondary | ICD-10-CM

## 2010-12-29 DIAGNOSIS — F603 Borderline personality disorder: Secondary | ICD-10-CM

## 2010-12-29 DIAGNOSIS — E669 Obesity, unspecified: Secondary | ICD-10-CM

## 2010-12-29 DIAGNOSIS — R45851 Suicidal ideations: Secondary | ICD-10-CM

## 2010-12-29 DIAGNOSIS — F3160 Bipolar disorder, current episode mixed, unspecified: Secondary | ICD-10-CM

## 2010-12-29 DIAGNOSIS — F1020 Alcohol dependence, uncomplicated: Secondary | ICD-10-CM

## 2010-12-29 DIAGNOSIS — F1210 Cannabis abuse, uncomplicated: Secondary | ICD-10-CM

## 2010-12-29 DIAGNOSIS — F1994 Other psychoactive substance use, unspecified with psychoactive substance-induced mood disorder: Principal | ICD-10-CM

## 2010-12-30 DIAGNOSIS — F1920 Other psychoactive substance dependence, uncomplicated: Secondary | ICD-10-CM

## 2011-01-04 NOTE — Assessment & Plan Note (Signed)
NAMEEMMAKATE, HYPES            ACCOUNT NO.:  [id number]  MEDICAL RECORD NO.:  [id number]  LOCATION:  0403                          [facility]ACILITY[facility]  [facility]  PHYSICIAN:  Eulogio Ditch, MD DATE OF BIRTH:  [birth year]  DATE OF ADMISSION:  12/29/2010 DATE OF DISCHARGE:                      PSYCHIATRIC ADMISSION ASSESSMENT   HISTORY OF PRESENT ILLNESS:  This is an involuntary admission to the services of Dr. Rogers Blocker.  This is a 30 year old single white female. She apparently is in a committed lesbian relationship.  She just got out of [employer] 4 days ago.  She was stressing out about her unemployment benefits and decided to get high after arguing with her girlfriend.  She knows she needs to stop drinking.  She has lost her license due to DWI.  She is off of probation; however, she says when eligible for a license next year, she will have to have the locked steering well.  On admission to the emergency room she had an alcohol level of 97.  She was positive once again for marijuana.  She apparently told them that she had taken several of her Seroquel 300 mg and drank about 6 beers, then she called EMS for a suicide attempt.  She has a long history for the same, and during her last hospitalization, she escaped and was caught on [facility].  She was last an inpatient here from August 18th to August 20th.  That admission was precipitated by an overdose.  She re-presented to the emergency room on 08/27 to 09/01.  Again, she had overdosed.  Apparently, she got sent to [facility].  She reports a discharge after about 2 weeks at [facility], and she promptly attempted to overdose once again.  PAST PSYCHIATRIC HISTORY:  She he is known to have chronic alcohol and benzodiazepine dependence.  She is also known to have a borderline personality.  She has a history for mood disorder.  SOCIAL HISTORY:  She has a 13-year relationship with her roommate.  They used to have more  of a romantic relationship.  However, now they are just in the same household as friends and sharing expenses.  Her roommate objects to the patient's use of alcohol, marijuana and benzodiazepines.  FAMILY HISTORY:  Unavailable.  PAST MEDICAL HISTORY:  She has no regular primary care provider.  She is obese.  She does have a history for GERD.  CURRENT MEDICATIONS:  She states she was taking at least 20 mg of Haldol coming out of [facility] as well as Seroquel.  We will have to get her discharge summary.  ALLERGIES:  None.  PHYSICAL EXAMINATION/LABS:  Her physical exam is well documented from having been in the emergency room.  She was afebrile at 97.3-98.7.  Her pulse was 86-112, respirations 12-20, blood pressure 95/55 to 118/76. Again, she had alcohol of 97.  UDS was positive for marijuana.  Urine pregnancy was negative, and urinalysis was normal.  Her glucose was just slightly elevated at 103.  MENTAL STATUS EXAM:  Tonight she is alert and oriented.  She was appropriately groomed, dressed and nourished in her own clothing.  Her speech was an appropriate rate, rhythm and tone.  Her thought processes are not completely  clear or rational.  They are goal oriented, however. She wants to keep her house.  She wants her unemployment benefits to continue to be able to keep her house.  Judgment and insight are poor. Concentration and memory are superficially intact.  She denies any responsibility for her behaviors.  She says that she is passively suicidal, but she always is, especially if not getting her way.  She is not homicidal, and she is not psychotic.  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 Axis II:  Borderline personality disorder. Axis III:  Obesity, history for GERD. Axis IV:  Relationship issues, economic and probable housing issues. She states that she is currently off probation.  She is still  having sequelae from 2 DWIs, and once she can get her license back, which she thinks will be next year, she will have to have the interlock on her steering well.  PLAN:  She reports that the anniversary of her grandmother's death is upcoming, and this always makes her depressed.  She was cautioned that her asserting this will not be tolerated this time.  Her grandmother died 25 years ago.  She was told to deal with it.  We will get her discharge summary from [facility].  We will check with her ACT team, and hopefully her stay with [place] will be brief.     Mickie Leonarda Salon, P.A.-C.   ______________________________ Eulogio Ditch, MD    MD/MEDQ  D:  12/29/2010  T:  12/29/2010  Job:  [id number]  Electronically Signed by Jaci Lazier ADAMS P.A.-C. on 01/02/2011 05:50:20 PM Electronically Signed by Eulogio Ditch  on 01/04/2011 03:17:43 PM

## 2011-01-05 LAB — CBC
HCT: 38.6
HCT: 36.4
Hemoglobin: 13.4
Hemoglobin: 12.6
MCHC: 34.8
MCV: 96.1
RBC: 4.02
WBC: 7
WBC: 6.6

## 2011-01-05 LAB — DIFFERENTIAL
Eosinophils Absolute: 0.1
Lymphs Abs: 2.3
Monocytes Absolute: 0.3
Monocytes Relative: 5
Neutrophils Relative %: 61

## 2011-01-05 LAB — RAPID URINE DRUG SCREEN, HOSP PERFORMED
Amphetamines: NOT DETECTED
Barbiturates: NOT DETECTED
Opiates: NOT DETECTED

## 2011-01-05 LAB — SALICYLATE LEVEL: Salicylate Lvl: <4

## 2011-01-05 LAB — POCT I-STAT, CHEM 8
Calcium, Ion: 1.12
Chloride: 108
Creatinine, Ser: 0.7
Glucose, Bld: 102 — ABNORMAL HIGH
Hemoglobin: 13.9
Potassium: 3.7

## 2011-01-05 LAB — BASIC METABOLIC PANEL
Chloride: 109
GFR calc non Af Amer: >60
Potassium: 4.4
Sodium: 136

## 2011-01-05 LAB — MAGNESIUM: Magnesium: 1.9

## 2011-01-12 ENCOUNTER — Emergency Department (HOSPITAL_COMMUNITY)
Admission: EM | Admit: 2011-01-12 | Discharge: 2011-01-14 | Disposition: A | Discharge status: Home / Self Care | Payer: Self-pay | Attending: Emergency Medicine | Admitting: Emergency Medicine

## 2011-01-12 DIAGNOSIS — T43502A Poisoning by unspecified antipsychotics and neuroleptics, intentional self-harm, initial encounter: Secondary | ICD-10-CM | POA: Insufficient documentation

## 2011-01-12 DIAGNOSIS — T438X2A Poisoning by other psychotropic drugs, intentional self-harm, initial encounter: Secondary | ICD-10-CM | POA: Insufficient documentation

## 2011-01-12 DIAGNOSIS — F1210 Cannabis abuse, uncomplicated: Secondary | ICD-10-CM | POA: Insufficient documentation

## 2011-01-12 DIAGNOSIS — R4182 Altered mental status, unspecified: Secondary | ICD-10-CM | POA: Insufficient documentation

## 2011-01-12 DIAGNOSIS — T424X4A Poisoning by benzodiazepines, undetermined, initial encounter: Secondary | ICD-10-CM | POA: Insufficient documentation

## 2011-01-12 DIAGNOSIS — F1410 Cocaine abuse, uncomplicated: Secondary | ICD-10-CM | POA: Insufficient documentation

## 2011-01-12 DIAGNOSIS — F319 Bipolar disorder, unspecified: Secondary | ICD-10-CM | POA: Insufficient documentation

## 2011-01-12 DIAGNOSIS — F1010 Alcohol abuse, uncomplicated: Secondary | ICD-10-CM | POA: Insufficient documentation

## 2011-01-12 LAB — CBC
HCT: 37.3 % (ref 36.0–46.0)
Hemoglobin: 13 g/dL (ref 12.0–15.0)
MCH: 32.9 pg (ref 26.0–34.0)
MCHC: 34.9 g/dL (ref 30.0–36.0)
MCV: 94.4 fL (ref 78.0–100.0)

## 2011-01-12 LAB — COMPREHENSIVE METABOLIC PANEL
ALT: 15 U/L (ref 0–35)
AST: 15 U/L (ref 0–37)
Albumin: 3.8 g/dL (ref 3.5–5.2)
Alkaline Phosphatase: 89 U/L (ref 39–117)
BUN: 5 mg/dL — ABNORMAL LOW (ref 6–23)
Potassium: 3.3 mEq/L — ABNORMAL LOW (ref 3.5–5.1)
Sodium: 138 mEq/L (ref 135–145)
Total Protein: 7.7 g/dL (ref 6.0–8.3)

## 2011-01-12 LAB — ETHANOL: Alcohol, Ethyl (B): 148 mg/dL — ABNORMAL HIGH (ref 0–11)

## 2011-01-12 LAB — ACETAMINOPHEN LEVEL: Acetaminophen (Tylenol), Serum: <15 ug/mL (ref 10–30)

## 2011-01-12 LAB — RAPID URINE DRUG SCREEN, HOSP PERFORMED
Barbiturates: NOT DETECTED
Tetrahydrocannabinol: POSITIVE — AB

## 2011-01-12 LAB — DIFFERENTIAL
Lymphocytes Relative: 32 % (ref 12–46)
Lymphs Abs: 2.4 10*3/uL (ref 0.7–4.0)
Monocytes Absolute: 0.7 10*3/uL (ref 0.1–1.0)
Monocytes Relative: 9 % (ref 3–12)
Neutro Abs: 4.4 10*3/uL (ref 1.7–7.7)

## 2011-01-23 LAB — BASIC METABOLIC PANEL
GFR calc non Af Amer: >60
Glucose, Bld: 86
Potassium: 4
Sodium: 139

## 2011-01-23 LAB — DIFFERENTIAL
Eosinophils Relative: 0
Lymphocytes Relative: 26
Lymphs Abs: 2
Monocytes Absolute: 0.5

## 2011-01-23 LAB — CBC
HCT: 37
Hemoglobin: 12.8
RDW: 12.9
WBC: 7.9

## 2011-01-23 LAB — URINALYSIS, ROUTINE W REFLEX MICROSCOPIC
Leukocytes, UA: NEGATIVE
Protein, ur: NEGATIVE
Urobilinogen, UA: 0.2

## 2011-01-23 LAB — URINE MICROSCOPIC-ADD ON

## 2011-01-23 LAB — RAPID URINE DRUG SCREEN, HOSP PERFORMED
Amphetamines: NOT DETECTED
Benzodiazepines: POSITIVE — AB

## 2011-02-01 NOTE — Discharge Summary (Signed)
  Denise, Miller            ACCOUNT NO.:  [id number]  MEDICAL RECORD NO.:  [id number]  LOCATION:  0403                          FACILITY:  [facility]  PHYSICIAN:  Eulogio Ditch, MD DATE OF BIRTH:  02-Dec-1980  DATE OF ADMISSION:  12/29/2010 DATE OF DISCHARGE:  01/04/2011                              DISCHARGE SUMMARY   HISTORY OF PRESENT ILLNESS:  Please review the initial psych assessment for the details of the admission.  Briefly, a 30 year old Caucasian female who was admitted because of depressed mood and substance abuse. The patient has a long history of alcohol abuse.  The patient was started on her regular medications.  Haldol 5 mg 3 times a day.  On December 30, 2010, Haldol was discontinued and she was started on Seroquel 200 mg 3 times a day and at bedtime, as she was not responding well to the Haldol.  The patient has shown good response in the past to the Seroquel.  The patient was also started on January 01, 2011, Wellbutrin XL 150 mg p.o. daily and January 02, 2011, Vistaril 25 mg q.6 hours for anxiety.  The patient was referred to the acute drug and alcohol treatment center at [facility], but the patient then refused to go there and wanted to follow up in the outpatient setting.  Her acting was contacted and they were comfortable with this treatment plan. Psychoeducation given to the patient that next time if she was to be admitted because of drug abuse, then she will be referred to the rehab program.  The patient was agreeable to this.  MENTAL STATUS:  At the time of discharge on January 04, 2011, the patient was doing much better, not hallucinating, delusional, not suicidal or homicidal, not disorganized.  She was alert, awake, oriented x3.  Memory immediate, recent, and remote fair.  Attention and concentration good.  Abstraction ability good.  Insight and judgment good.  DIAGNOSES:  Axis I:  Substance-induced mood disorder.  Rule out bipolar disorder.   Alcohol and marijuana dependence. Axis II:  Borderline personality traits. Axis III:  Obesity, history of gastroesophageal reflux disease. Axis IV:  Psychosocial stressors, substance abuse. Axis V:  60.  DISCHARGE MEDICATIONS: 1. Seroquel 50 mg 1 tablet every 6 hours and then 400 mg at bedtime,     along with: 2. Omeprazole 20 mg p.o. daily. 3. Wellbutrin 150 XL daily. 4. __________ hydrochloride 2 mg at bedtime. 5. Seroquel 200 mg by mouth 4 times a day. 6. Trazodone 50 mg at bedtime as needed.  DISCHARGE FOLLOWUP:  The patient will follow up with __________ phone number [phone] for appointment on January 04, 2011, at 12 p.m.  She will also follow up at [facility] on January 06, 2011, at 11 am.     Eulogio Ditch, MD     SA/MEDQ  D:  01/24/2011  T:  01/24/2011  Job:  [id number]  Electronically Signed by Eulogio Ditch  on 02/01/2011 02:25:33 PM

## 2011-05-03 ENCOUNTER — Emergency Department (HOSPITAL_COMMUNITY)
Admission: EM | Admit: 2011-05-03 | Discharge: 2011-05-03 | Disposition: A | Discharge status: Home / Self Care | Payer: Self-pay | Attending: Internal Medicine | Admitting: Internal Medicine

## 2011-05-03 ENCOUNTER — Encounter (HOSPITAL_COMMUNITY): Payer: Self-pay | Admitting: *Deleted

## 2011-05-03 DIAGNOSIS — F3130 Bipolar disorder, current episode depressed, mild or moderate severity, unspecified: Secondary | ICD-10-CM | POA: Insufficient documentation

## 2011-05-03 DIAGNOSIS — R45851 Suicidal ideations: Secondary | ICD-10-CM

## 2011-05-03 DIAGNOSIS — F10929 Alcohol use, unspecified with intoxication, unspecified: Secondary | ICD-10-CM

## 2011-05-03 DIAGNOSIS — F17200 Nicotine dependence, unspecified, uncomplicated: Secondary | ICD-10-CM | POA: Insufficient documentation

## 2011-05-03 DIAGNOSIS — F1010 Alcohol abuse, uncomplicated: Secondary | ICD-10-CM | POA: Insufficient documentation

## 2011-05-03 DIAGNOSIS — F411 Generalized anxiety disorder: Secondary | ICD-10-CM | POA: Insufficient documentation

## 2011-05-03 DIAGNOSIS — Z781 Physical restraint status: Secondary | ICD-10-CM | POA: Insufficient documentation

## 2011-05-03 HISTORY — DX: Major depressive disorder, single episode, unspecified: F32.9

## 2011-05-03 HISTORY — DX: Depression, unspecified: F32.A

## 2011-05-03 HISTORY — DX: Anxiety disorder, unspecified: F41.9

## 2011-05-03 HISTORY — DX: Bipolar disorder, unspecified: F31.9

## 2011-05-03 LAB — COMPREHENSIVE METABOLIC PANEL
Alkaline Phosphatase: 67 U/L (ref 39–117)
BUN: 8 mg/dL (ref 6–23)
Calcium: 9 mg/dL (ref 8.4–10.5)
GFR calc Af Amer: >90 mL/min (ref >90)
GFR calc non Af Amer: >90 mL/min (ref >90)
Glucose, Bld: 105 mg/dL — ABNORMAL HIGH (ref 70–99)
Potassium: 3.3 mEq/L — ABNORMAL LOW (ref 3.5–5.1)
Total Protein: 7.4 g/dL (ref 6.0–8.3)

## 2011-05-03 LAB — ETHANOL: Alcohol, Ethyl (B): 126 mg/dL — ABNORMAL HIGH (ref 0–11)

## 2011-05-03 LAB — CBC
HCT: 37.9 % (ref 36.0–46.0)
Hemoglobin: 13.5 g/dL (ref 12.0–15.0)
MCH: 32.8 pg (ref 26.0–34.0)
MCHC: 35.6 g/dL (ref 30.0–36.0)

## 2011-05-03 LAB — POCT PREGNANCY, URINE: Preg Test, Ur: NEGATIVE

## 2011-05-03 LAB — RAPID URINE DRUG SCREEN, HOSP PERFORMED: Benzodiazepines: NOT DETECTED

## 2011-05-03 MED ORDER — NICOTINE 21 MG/24HR TD PT24: 21.0000 mg | MEDICATED_PATCH | Freq: Once | TRANSDERMAL | Status: DC

## 2011-05-03 MED ORDER — ALUM & MAG HYDROXIDE-SIMETH 200-200-20 MG/5ML PO SUSP
30.0000 mL | ORAL | Status: DC | PRN
  Administered 2011-05-03: 30 mL via ORAL
  Filled 2011-05-03: qty 30

## 2011-05-03 MED ORDER — IBUPROFEN 600 MG PO TABS
600.0000 mg | ORAL_TABLET | Freq: Three times a day (TID) | ORAL | Status: DC | PRN
  Administered 2011-05-03: 600 mg via ORAL
  Filled 2011-05-03: qty 1

## 2011-05-03 MED ORDER — ZOLPIDEM TARTRATE 5 MG PO TABS: 5.0000 mg | ORAL_TABLET | Freq: Every evening | ORAL | Status: DC | PRN

## 2011-05-03 MED ORDER — ACETAMINOPHEN 325 MG PO TABS: 650.0000 mg | ORAL_TABLET | ORAL | Status: DC | PRN

## 2011-05-03 MED ORDER — NICOTINE 21 MG/24HR TD PT24
MEDICATED_PATCH | TRANSDERMAL | Status: AC
  Administered 2011-05-03: 21 mg
  Filled 2011-05-03: qty 1

## 2011-05-03 MED ORDER — FAMOTIDINE 20 MG PO TABS
20.0000 mg | ORAL_TABLET | Freq: Once | ORAL | Status: AC
  Administered 2011-05-03: 20 mg via ORAL
  Filled 2011-05-03: qty 1

## 2011-05-03 MED ORDER — ZIPRASIDONE MESYLATE 20 MG IM SOLR
INTRAMUSCULAR | Status: AC
  Administered 2011-05-03: 20 mg via INTRAMUSCULAR
  Filled 2011-05-03: qty 20

## 2011-05-03 MED ORDER — LORAZEPAM 1 MG PO TABS
1.0000 mg | ORAL_TABLET | Freq: Three times a day (TID) | ORAL | Status: DC | PRN
  Administered 2011-05-03 (×2): 1 mg via ORAL
  Filled 2011-05-03 (×2): qty 1

## 2011-05-03 MED ORDER — ONDANSETRON HCL 4 MG PO TABS: 4.0000 mg | ORAL_TABLET | Freq: Three times a day (TID) | ORAL | Status: DC | PRN

## 2011-05-03 NOTE — ED Notes (Addendum)
While in another pt's room, I heard a call for help and a call for [employer]. The call for help was from NT [place] and [employer] in room 19. Upon entering the room, I witness the tech and RN trying to unwrap the cord connected to the stretcher around the pt's neck. The pt had taken the cord and wrapped it around her neck in an attempt to hurt herself. Further help arrived and the pt continued to fight and scream at staff. Pt was non-compliant to any instruction to stop trying to fight staff. Pt was physically and verbally aggressive to nursing staff, security, and [employer]. Pt stated multiple times "I am going to kill all you. Just wait till I get out, I am going to kill all of you!" Pt was given multiple instructions and had multiple opportunities to stop fighting and yelling at staff. Pt was then repositioned in the bed, put into four point restraints and hand cuffs on her wrists. Once in restraints and the pt was secured, pt continued to yell, scream, spit, and attempt to go after staff. RN staff confirmed pt had KNDA, then 20mg  of Geodon was given in her left vastus lateralis. All staff except for one security guard, one [employer] officer and myself were removed from the room. Pt was again given clear expectations of what is appropriate behavior. Pt continued to scream, and curse. Theron Arista PA-C attempted to talk and assess the pt but the pt continued to yell and scream. Pt has +3 bilateral radial pulses, +3 dorsal pedial pulses, <3 sec cap refill, and pink warm skin. Will continue to monitor

## 2011-05-03 NOTE — ED Notes (Signed)
Pt given water by Porfirio Mylar NT. Pt's privacy and dignity continue to be maintained. Sitter, sheriff at bedside. Will continue to monitor

## 2011-05-03 NOTE — ED Notes (Signed)
Pleasant and cooperative, no needs at this time

## 2011-05-03 NOTE — ED Notes (Signed)
Pt pulling at cords in triage room and verbalized that she wanted to choke herself, cords removed from patient, charge RN notified, pt to be moved to hall bed for close observation

## 2011-05-03 NOTE — ED Provider Notes (Signed)
History     CSN: [id number]  Arrival date & time 05/03/11  0047   First MD Initiated Contact with Patient 05/03/11 0115      Chief Complaint  Patient presents with  . Medical Clearance    (Consider location/radiation/quality/duration/timing/severity/associated sxs/prior treatment) HPI  Past Medical History  Diagnosis Date  . Depression   . Bipolar 1 disorder   . Anxiety     No past surgical history on file.  No family history on file.  History  Substance Use Topics  . Smoking status: Current Everyday Smoker  . Smokeless tobacco: Not on file  . Alcohol Use: Yes    OB History    Grav Para Term Preterm Abortions TAB SAB Ect Mult Living                  Review of Systems  Allergies  Review of patient's allergies indicates no known allergies.  Home Medications  No current outpatient prescriptions on file.  BP 120/88  Pulse 97  Temp(Src) 98.9 F (37.2 C) (Oral)  Resp 16  SpO2 98%  Physical Exam  ED Course  Procedures (including critical care time)  Labs Reviewed  COMPREHENSIVE METABOLIC PANEL - Abnormal; Notable for the following:    Potassium 3.3 (*)    CO2 18 (*)    Glucose, Bld 105 (*)    Total Bilirubin 0.1 (*)    All other components within normal limits  ETHANOL - Abnormal; Notable for the following:    Alcohol, Ethyl (B) 126 (*)    All other components within normal limits  URINE RAPID DRUG SCREEN (HOSP PERFORMED) - Abnormal; Notable for the following:    Tetrahydrocannabinol POSITIVE (*)    All other components within normal limits  CBC  POCT PREGNANCY, URINE  POCT PREGNANCY, URINE   No results found.   1. Suicidal ideation   2. Alcohol intoxication       MDM   Patient had a telepsych done and they rescinded her IVC paperwork and felt that she was safe to go home. She has a history of alcohol abuse and bipolar disease. She will followup with [facility]. I also evaluated her and she denies any suicidality and feel  she is safe to go home.        Gwyneth Sprout, MD 05/03/11 2040

## 2011-05-03 NOTE — ED Notes (Signed)
Patient states she is not angry now but her feelings are hurt and she is still snubbing/sniffing, gave supplies to take a shower, is calmer and cooperative at this time

## 2011-05-03 NOTE — ED Notes (Signed)
Two bags of belonging secured in locker by tech escorting pt over to psych ED

## 2011-05-03 NOTE — ED Notes (Signed)
Pt in c/o SI "for awhile" and admits to ETOH use, unsure if she wants detox, denies other drug use or HI

## 2011-05-03 NOTE — ED Notes (Signed)
Pt and pt's belongings wanded by security.  
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
## 2011-05-03 NOTE — ED Notes (Signed)
Upset b/c she has not been seen by ACT this morning, had informed her earlier that she would be seen sometime today, was ok w/this answer, then she was started yelling after this nurse informed her she would be seeing someone after lunch, about having to wait to see someone, why was she IVCd, it is now fair, "I came in here on my own", explained to her that her behaviors in the ER last nite got her IVCd. Began cursing this nurse, 2 security officers and 1 [employer] Officer, they assisted her back to her room

## 2011-05-03 NOTE — ED Provider Notes (Signed)
History     CSN: [id number]  Arrival date & time 05/03/11  0047   First MD Initiated Contact with Patient 05/03/11 0115      Chief Complaint  Patient presents with  . Medical Clearance     HPI  History provided by patient and Emergency planning/management officer. Patient is a 31 year old female with past history of depression and bipolar disorder who presented with complaints of suicidal ideations and thoughts. Patient is uncooperative and does not give full details or information. Patient's admits to drinking alcohol this evening. She states she was feeling suicidal and came voluntarily. Sheriff Officer reports that 911 was called and he arrived to the residence where the patient was located. She expressed her feelings of suicidal thoughts and requested a ride to the emergency room. She was brought in voluntarily. Patient has similar prior visits to the emergency room. Patient states that she was being followed for psychiatric issues at the [facility] mental health office. She reports being off any medications for the past month or longer. She does not report medicine she was on. Patient denies any drug use.   Past Medical History  Diagnosis Date  . Depression   . Bipolar 1 disorder   . Anxiety     No past surgical history on file.  No family history on file.  History  Substance Use Topics  . Smoking status: Current Everyday Smoker  . Smokeless tobacco: Not on file  . Alcohol Use: Yes    OB History    Grav Para Term Preterm Abortions TAB SAB Ect Mult Living                  Review of Systems  Unable to perform ROS  level V applies due to uncooperativeness.  Allergies  Review of patient's allergies indicates no known allergies.  Home Medications  No current outpatient prescriptions on file.  BP 120/88  Pulse 129  Temp(Src) 98.3 F (36.8 C) (Oral)  Resp 18  SpO2 99%  Physical Exam  Nursing note and vitals reviewed. Constitutional: She is oriented to person, place, and  time. She appears well-developed and well-nourished.  HENT:  Head: Normocephalic and atraumatic.  Neck: Neck supple. No tracheal deviation present.  Cardiovascular: Regular rhythm.  Tachycardia present.   Pulmonary/Chest: Effort normal and breath sounds normal. No stridor. No respiratory distress. She has no wheezes.  Abdominal: Soft. She exhibits no distension. There is no tenderness.  Neurological: She is alert and oriented to person, place, and time.  Skin: Skin is warm and dry. No rash noted.  Psychiatric: Her speech is normal. Her affect is angry. She is agitated, aggressive and combative. She expresses suicidal ideation. She expresses suicidal plans.    ED Course  Procedures    Labs Reviewed  POCT PREGNANCY, URINE  CBC  COMPREHENSIVE METABOLIC PANEL  ETHANOL  URINE RAPID DRUG SCREEN (HOSP PERFORMED)  POCT PREGNANCY, URINE   Results for orders placed during the hospital encounter of 05/03/11  CBC      Component Value Range   WBC 10.5  4.0 - 10.5 (K/uL)   RBC 4.12  3.87 - 5.11 (MIL/uL)   Hemoglobin 13.5  12.0 - 15.0 (g/dL)   HCT 40.9  81.1 - 91.4 (%)   MCV 92.0  78.0 - 100.0 (fL)   MCH 32.8  26.0 - 34.0 (pg)   MCHC 35.6  30.0 - 36.0 (g/dL)   RDW 78.2  95.6 - 21.3 (%)   Platelets 338  150 -  400 (K/uL)  COMPREHENSIVE METABOLIC PANEL      Component Value Range   Sodium 138  135 - 145 (mEq/L)   Potassium 3.3 (*) 3.5 - 5.1 (mEq/L)   Chloride 105  96 - 112 (mEq/L)   CO2 18 (*) 19 - 32 (mEq/L)   Glucose, Bld 105 (*) 70 - 99 (mg/dL)   BUN 8  6 - 23 (mg/dL)   Creatinine, Ser 0.98  0.50 - 1.10 (mg/dL)   Calcium 9.0  8.4 - 11.9 (mg/dL)   Total Protein 7.4  6.0 - 8.3 (g/dL)   Albumin 3.8  3.5 - 5.2 (g/dL)   AST 18  0 - 37 (U/L)   ALT 18  0 - 35 (U/L)   Alkaline Phosphatase 67  39 - 117 (U/L)   Total Bilirubin 0.1 (*) 0.3 - 1.2 (mg/dL)   GFR calc non Af Amer >90  >90 (mL/min)   GFR calc Af Amer >90  >90 (mL/min)  ETHANOL      Component Value Range   Alcohol, Ethyl (B)  126 (*) 0 - 11 (mg/dL)  URINE RAPID DRUG SCREEN (HOSP PERFORMED)      Component Value Range   Opiates NONE DETECTED  NONE DETECTED    Cocaine NONE DETECTED  NONE DETECTED    Benzodiazepines NONE DETECTED  NONE DETECTED    Amphetamines NONE DETECTED  NONE DETECTED    Tetrahydrocannabinol POSITIVE (*) NONE DETECTED    Barbiturates NONE DETECTED  NONE DETECTED   POCT PREGNANCY, URINE      Component Value Range   Preg Test, Ur NEGATIVE       1. Suicidal ideation   2. Alcohol intoxication       MDM    1:15AM Pt seen and evaluated.  Prior to my arrival patient was reported to be in the room unsupervised and found with electrical cord wrapped around her neck in the attempt to suffocate herself. Patient then was aggressive and uncooperative towards staff. She was restrained and placed in restraints in the bed. 20 mg of Geodon was given IM. Pt currently in no acute distress but agitated.  Pt verbally abusive and yelling.  Pt is uncooperative at this time and restrained in bed.   Patient to be moved to psych ED. Involuntary commit papers taken out. Psych holding orders in place. Patient also discussed with Aurther Loft with the act team. She will add patient to the list for further evaluation.    Angus Seller, [place] 05/03/11 [phone]  Medical screening examination/treatment/procedure(s) were performed by non-physician practitioner and as supervising physician I was immediately available for consultation/collaboration.   Sunnie Nielsen, MD 05/03/11 [phone]

## 2011-05-03 NOTE — ED Notes (Signed)
Spoke with pt. Gave her update on situation. That she had been evaluated by the initial ACT around 6AM. Let her know that she had been declined at [facility] for today due to her behavior and SI attempt while in the ED. Explained that her negative behaviors and aggressive actions are things being monitored and would be reasons for pt to be declined for services at many of the hospitals. Discussed ways for her to calm herself and urged her cooperation with the ED staff. Let her know that she was IVC from the EDP due to her SI attempt and threats to the ED staff. Explained she would need to be evaluated by a psychiatrist before she could be released. Discussed with pt options and she is agreeable to be cooperative. Will evaluate for a telepsych later this afternoon, once she has calmed and remained cooperative for a little while. Pt admitted to not being on her medications since she got out of hospital in Oct 2012. Stated she was upset at life and tired of everything. Explained how she may be in ED for a little bit. Pt agreeable with plan for her to remain cooperative, for possible evaluation later this afternoon by telepsych and maybe have her info reviewed again at [facility] tomorrow, if placement or d/c not occur before that time. Let pt know that we are concerned about the repetitive thoughts of SI and her multiple attempts and want to make sure that she is safe. Pt agreeable and was cooperative during our conversation.

## 2011-05-03 NOTE — ED Notes (Signed)
Patient escalating. [employer] and security at pt door trying to calm pt and keep her in her room. Pt did finally slam her door and sit on bed. A few minutes later patient came out of room in an aggressive manner. [employer] to hallway at which point patient started yelling "can't I even go to the bathroom mother fucker? You fucking nigger. Nigger, nigger, nigger." Pt then went into bathroom. Patient came out of restroom and stated to [employer] "what are you looking at nigger? I hope somebody kills your fucking nigger ass". Patient did go back to her room without any assistance by [employer] or security.

## 2011-05-03 NOTE — ED Notes (Signed)
D/t behavior written below, offered Ativan and Nicotene patch which she asked for, applied/given

## 2011-05-03 NOTE — ED Notes (Signed)
ACT assessment performed at this time

## 2011-05-03 NOTE — ED Notes (Signed)
Pt ambulated to the bathroom with sitter without difficulty. Pt instructed to change into blue scrubs. Pt compliant and understanding of why the need for sitter and blue scrubs. Will continue to monitor

## 2011-05-03 NOTE — ED Notes (Signed)
Pt's agitation has continued to decrease. Pt agreed to be placed on cardiac monitor. Pt was placed on cardiac monitor by [facility] NT. Pt has sitter had bedside. Pt still in restraints, dignity and privacy has been maintained.

## 2011-05-03 NOTE — ED Notes (Signed)
C/o R elbow pain, medicated w/IBU

## 2011-05-03 NOTE — Discharge Planning (Signed)
Patient's information has been faxed to [facility] for priority list consideration.  Ileene Hutchinson , MSW, LCSWA 05/03/2011 10:18 AM [phone]

## 2011-05-03 NOTE — Discharge Planning (Signed)
Nedra Hai at [facility] spoke called and advised patient is on [facility] priority list. Patient's [facility] authorization number has been requested. CSW called and left message for Martie Lee at [facility] at [phone] to find out status of authorization number request.  Ileene Hutchinson , MSW, LCSWA 05/03/2011 2:13 PM [phone]

## 2011-05-03 NOTE — ED Notes (Signed)
Talking to the Counselor

## 2011-05-03 NOTE — ED Notes (Signed)
C/o indigestion, Maalox

## 2011-05-03 NOTE — ED Notes (Signed)
Pt prepared for discharge. Contracts for safety with ACT team member. Verbalizes understanding of discharge instructions. Denies further need at this time. No acute distress.

## 2011-05-03 NOTE — ED Notes (Signed)
Unable to get labs from pt at this time.

## 2011-05-03 NOTE — ED Provider Notes (Signed)
Assessed this am and has continued suicidal thoughts.  Awaiting formal disposition from psych.  Administered pepcid for indigestion.  Dayton Bailiff, MD 05/03/11 [phone]

## 2011-05-03 NOTE — ED Notes (Signed)
Telepsych recommends discharge and to rescind IVC papers at this time. EDP was given telepsych reports and is in agreement. At request of EDP, ACT gave pt outpatient resources and completed safety contract.

## 2011-05-03 NOTE — ED Notes (Signed)
Pt calm and cooperative when explained she was being moved to psych ED. Pt stated she understood instructions. The sitter Delorise Shiner NT will escort pt to psych ED

## 2011-05-03 NOTE — ED Notes (Signed)
Tele-psych interview conducted at this time with NT attending

## 2011-05-03 NOTE — ED Notes (Signed)
Pt calm and cooperative, departs unit with friend in safe and stable condition.

## 2011-08-24 ENCOUNTER — Encounter (HOSPITAL_COMMUNITY): Payer: Self-pay | Admitting: *Deleted

## 2011-08-24 ENCOUNTER — Emergency Department (HOSPITAL_COMMUNITY)
Admission: EM | Admit: 2011-08-24 | Discharge: 2011-08-25 | Disposition: A | Discharge status: Home / Self Care | Payer: Self-pay | Attending: Emergency Medicine | Admitting: Emergency Medicine

## 2011-08-24 DIAGNOSIS — R451 Restlessness and agitation: Secondary | ICD-10-CM

## 2011-08-24 DIAGNOSIS — IMO0002 Reserved for concepts with insufficient information to code with codable children: Secondary | ICD-10-CM | POA: Insufficient documentation

## 2011-08-24 DIAGNOSIS — F1910 Other psychoactive substance abuse, uncomplicated: Secondary | ICD-10-CM

## 2011-08-24 DIAGNOSIS — R45851 Suicidal ideations: Secondary | ICD-10-CM

## 2011-08-24 LAB — COMPREHENSIVE METABOLIC PANEL
ALT: 16 U/L (ref 0–35)
AST: 16 U/L (ref 0–37)
Albumin: 4.1 g/dL (ref 3.5–5.2)
Alkaline Phosphatase: 65 U/L (ref 39–117)
BUN: 5 mg/dL — ABNORMAL LOW (ref 6–23)
CO2: 20 mEq/L (ref 19–32)
Calcium: 8.9 mg/dL (ref 8.4–10.5)
Chloride: 105 mEq/L (ref 96–112)
Creatinine, Ser: 0.68 mg/dL (ref 0.50–1.10)
GFR calc Af Amer: >90 mL/min (ref >90)
GFR calc non Af Amer: >90 mL/min (ref >90)
Glucose, Bld: 98 mg/dL (ref 70–99)
Potassium: 3.6 mEq/L (ref 3.5–5.1)
Sodium: 137 mEq/L (ref 135–145)
Total Bilirubin: 0.2 mg/dL — ABNORMAL LOW (ref 0.3–1.2)
Total Protein: 7.4 g/dL (ref 6.0–8.3)

## 2011-08-24 LAB — DIFFERENTIAL
Basophils Absolute: 0 10*3/uL (ref 0.0–0.1)
Basophils Relative: 0 % (ref 0–1)
Eosinophils Absolute: 0.1 10*3/uL (ref 0.0–0.7)
Eosinophils Relative: 1 % (ref 0–5)
Lymphocytes Relative: 27 % (ref 12–46)
Lymphs Abs: 2.2 10*3/uL (ref 0.7–4.0)
Monocytes Absolute: 0.5 10*3/uL (ref 0.1–1.0)
Monocytes Relative: 6 % (ref 3–12)
Neutro Abs: 5.5 10*3/uL (ref 1.7–7.7)
Neutrophils Relative %: 67 % (ref 43–77)

## 2011-08-24 LAB — CBC
HCT: 38 % (ref 36.0–46.0)
Hemoglobin: 13.3 g/dL (ref 12.0–15.0)
MCH: 33.3 pg (ref 26.0–34.0)
MCHC: 35 g/dL (ref 30.0–36.0)
MCV: 95 fL (ref 78.0–100.0)
Platelets: 384 10*3/uL (ref 150–400)
RBC: 4 MIL/uL (ref 3.87–5.11)
RDW: 12.9 % (ref 11.5–15.5)
WBC: 8.3 10*3/uL (ref 4.0–10.5)

## 2011-08-24 LAB — POCT PREGNANCY, URINE: Preg Test, Ur: NEGATIVE

## 2011-08-24 LAB — RAPID URINE DRUG SCREEN, HOSP PERFORMED
Amphetamines: NOT DETECTED
Barbiturates: NOT DETECTED
Benzodiazepines: POSITIVE — AB
Cocaine: NOT DETECTED
Opiates: NOT DETECTED
Tetrahydrocannabinol: POSITIVE — AB

## 2011-08-24 LAB — ETHANOL: Alcohol, Ethyl (B): 91 mg/dL — ABNORMAL HIGH (ref 0–11)

## 2011-08-24 NOTE — ED Notes (Addendum)
Pt in requesting detox from xanax, last use today, also ETOH tonight, admits to SI but no plan

## 2011-08-24 NOTE — ED Notes (Signed)
Pt. Has 3 bag of belongings (2 duffle bags and 1 pt. Belonging) -- All 3 are located at nurse's station across from room

## 2011-08-24 NOTE — ED Notes (Signed)
Pt. in blue scrubs and red socks -- Pt. and belongings have both been wanded by security 

## 2011-08-24 NOTE — ED Notes (Signed)
Pt presented to the ER stating that she needs detox form Xanax, at this time pt denies any SI or HI plans,but states that states that she constantly thinking of SI, pt states "i do not wont to end up taking cocaine" pt also states that " i am taking 14-15 xanax a day"

## 2011-08-25 DIAGNOSIS — F1320 Sedative, hypnotic or anxiolytic dependence, uncomplicated: Secondary | ICD-10-CM

## 2011-08-25 DIAGNOSIS — F1220 Cannabis dependence, uncomplicated: Secondary | ICD-10-CM

## 2011-08-25 DIAGNOSIS — F1010 Alcohol abuse, uncomplicated: Secondary | ICD-10-CM

## 2011-08-25 DIAGNOSIS — F1994 Other psychoactive substance use, unspecified with psychoactive substance-induced mood disorder: Secondary | ICD-10-CM

## 2011-08-25 MED ORDER — LORAZEPAM 1 MG PO TABS
2.0000 mg | ORAL_TABLET | Freq: Once | ORAL | Status: AC
  Administered 2011-08-25: 2 mg via ORAL
  Filled 2011-08-25: qty 2

## 2011-08-25 MED ORDER — PANTOPRAZOLE SODIUM 20 MG PO TBEC
20.0000 mg | DELAYED_RELEASE_TABLET | Freq: Every day | ORAL | Status: DC
  Administered 2011-08-25: 20 mg via ORAL
  Filled 2011-08-25: qty 1

## 2011-08-25 MED ORDER — DIPHENHYDRAMINE HCL 50 MG/ML IJ SOLN
50.0000 mg | Freq: Once | INTRAMUSCULAR | Status: AC
  Administered 2011-08-25: 50 mg via INTRAMUSCULAR
  Filled 2011-08-25: qty 1

## 2011-08-25 MED ORDER — LORAZEPAM 1 MG PO TABS
1.0000 mg | ORAL_TABLET | Freq: Three times a day (TID) | ORAL | Status: DC | PRN
  Administered 2011-08-25 (×2): 1 mg via ORAL
  Filled 2011-08-25 (×2): qty 1

## 2011-08-25 MED ORDER — ZIPRASIDONE MESYLATE 20 MG IM SOLR
20.0000 mg | Freq: Once | INTRAMUSCULAR | Status: AC
  Administered 2011-08-25: 20 mg via INTRAMUSCULAR
  Filled 2011-08-25: qty 20

## 2011-08-25 MED ORDER — CHLORDIAZEPOXIDE HCL 10 MG PO CAPS: 10.0000 mg | ORAL_CAPSULE | Freq: Four times a day (QID) | ORAL | Status: DC | PRN

## 2011-08-25 MED ORDER — NICOTINE 21 MG/24HR TD PT24
21.0000 mg | MEDICATED_PATCH | Freq: Once | TRANSDERMAL | Status: DC
  Administered 2011-08-25: 21 mg via TRANSDERMAL

## 2011-08-25 MED ORDER — NICOTINE 21 MG/24HR TD PT24
MEDICATED_PATCH | TRANSDERMAL | Status: AC
  Filled 2011-08-25: qty 1

## 2011-08-25 MED ORDER — NICOTINE 21 MG/24HR TD PT24
21.0000 mg | MEDICATED_PATCH | Freq: Every day | TRANSDERMAL | Status: DC
  Administered 2011-08-25: 21 mg via TRANSDERMAL
  Filled 2011-08-25: qty 1

## 2011-08-25 MED ORDER — ZIPRASIDONE HCL 20 MG PO CAPS
20.0000 mg | ORAL_CAPSULE | Freq: Two times a day (BID) | ORAL | Status: DC
  Administered 2011-08-25 (×2): 20 mg via ORAL
  Filled 2011-08-25 (×2): qty 1

## 2011-08-25 MED ORDER — ONDANSETRON HCL 4 MG PO TABS: 4.0000 mg | ORAL_TABLET | Freq: Three times a day (TID) | ORAL | Status: DC | PRN

## 2011-08-25 NOTE — ED Notes (Signed)
Pt up at nursing station crying because she was told that she's reached her therapeutic limit at [facility] and she wants to know if it will be lifted or will that be for the duration of life. Pt said she's very upset and about to go off. She says she will discharge home and go get "fucked up on pills again". Will medicate per MD orders.
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
## 2011-08-25 NOTE — ED Notes (Addendum)
CSW was contacted by Yvetta Coder stating that pt was accepted for treatment by Dr. Forrestine Him in [place] [facility] [address]. Pt was notified of this and is refusing treatment. CSW and RN explained to pt that while [facility] was unable to accept, [facility] is able to offer treatment. Pt continues to refuse treatment stating that she wants to be discharged. Pt states that she is not suicidal and denies a plan stating that she is able to contract for safety. CSW has provided pt with information on [place] [facility], local psychiatrists/therapist for SA and mobile crisis services. Pt states that she had previous services with [facility] for ACT team and commented that she was going to follow up with them today. Pt stated she was familiar with mobile crisis services and has called them in the past, finding the service helpful. Pt is willing to contract for safety and identified no further needs. CSW signing off.

## 2011-08-25 NOTE — ED Notes (Signed)
Tech Ranae Pila)  informed this nurse patient stated on way out of hospital, "she really did call her drug dealer and that she was on her way to meet him".

## 2011-08-25 NOTE — ED Notes (Signed)
Pt moved to psych ed room 40. Small duffel bag and white plastic bag of belongings locked in locker 40.  Bigger duffel bag placed in locked activity room.

## 2011-08-25 NOTE — ED Notes (Signed)
MD notified pt reports taking Prevacid at home and would like some ordered here, MD to order.

## 2011-08-25 NOTE — Discharge Instructions (Signed)
Chemical Dependency Chemical dependency is an addiction to drugs or alcohol. It is characterized by the repeated behavior of seeking out and using drugs and alcohol despite harmful consequences to the health and safety of ones self and others.  RISK FACTORS There are certain situations or behaviors that increase a person's risk for chemical dependency. These include:  A family history of chemical dependency.   A history of mental health issues, including depression and anxiety.   A home environment where drugs and alcohol are easily available to you.   Drug or alcohol use at a young age.  SYMPTOMS  The following symptoms can indicate chemical dependency:  Inability to limit the use of drugs or alcohol.   Nausea, sweating, shakiness, and anxiety that occurs when alcohol or drugs are not being used.   An increase in amount of drugs or alcohol that is necessary to get drunk or high.  People who experience these symptoms can assess their use of drugs and alcohol by asking themselves the following questions:  Have you been told by friends or family that they are worried about your use of alcohol or drugs?   Do friends and family ever tell you about things you did while drinking alcohol or using drugs that you do not remember?   Do you lie about using alcohol or drugs or about the amounts you use?   Do you have difficulty completing daily tasks unless you use alcohol or drugs?   Is the level of your work or school performance lower because of your drug or alcohol use?   Do you get sick from using drugs or alcohol but keep using anyway?   Do you feel uncomfortable in social situations unless you use alcohol or drugs?   Do you use drugs or alcohol to help forget problems?  An answer of yes to any of these questions may indicate chemical dependency. Professional evaluation is suggested. Document Released: 03/21/2001 Document Revised: 03/16/2011 Document Reviewed: 06/02/2010 [facility]  Patient Information 2012 [place], [place].Drug Abuse, Frequently Asked Questions Drug addiction is a complex brain disease. It is characterized by compulsive, at times uncontrollable, drug craving, seeking, and use that persists even in the face of extremely negative results. Drug seeking becomes compulsive, in large part as a result of the effects of prolonged drug use on brain functioning and, thus, on behavior. For many people, drug addiction becomes chronic, with relapses possible even after long periods of being off the drug. HOW QUICKLY CAN I BECOME ADDICTED TO A DRUG? There is no easy answer to this. If and how quickly you might become addicted to a drug depends on many factors including the biology of your body. All drugs are potentially harmful and may have life-threatening consequences associated with their use. There are also vast differences among individuals in sensitivity to various drugs. While one person may use a drug many times and suffer no ill effects, another person may be particularly vulnerable and overdose or developing a craving with the first use. There is no way of knowing in advance how someone may react. HOW DO I KNOW IF SOMEONE IS ADDICTED TO DRUGS? If a person is compulsively seeking and using a drug despite negative consequences (such as loss of job, debt, physical problems brought on by drug abuse, or family problems) then he or she is probably addicted. Those who screen for drug problems, such as physicians, have developed the CAGE questionnaire. These four simple questions can help detect substance abuse problems:  Have  you ever felt you ought to Cut down on your drinking/drug use?   Have people ever Annoyed you by criticizing your drinking/drug use?   Have you ever felt bad or Guilty about your drinking/drug use?   Have you ever had a drink or taken a drug first thing in the morning to steady your nerves or get rid of a hangover (Eye-opener)?  WHAT ARE THE PHYSICAL  SIGNS OF ABUSE OR ADDICTION? The physical signs of abuse or addiction can vary depending on the person and the drug being abused. For example, someone who abuses marijuana may have a chronic cough or worsening of asthmatic conditions. THC, the chemical in marijuana responsible for producing its effects, is associated with weakening the immune system which makes the user more vulnerable to infections, such as pneumonia. Each drug has short-term and long-term physical effects. Stimulants like cocaine increase heart rate and blood pressure, whereas opioids like heroin may slow the heart rate and reduce breathing (respiration).  ARE THERE EFFECTIVE TREATMENTS FOR DRUG ADDICTION? Drug addiction can be effectively treated with behavioral-based therapies and, for addiction to some drugs such as heroin or nicotine, medications may be used. Treatment may vary for each person depending on the type of drug(s) being used and multiple courses of treatment may be needed to achieve success. Research has revealed 13 basic principles that underlie effective drug addiction treatment. These are discussed in NIDA's Principles of Drug Addiction Treatment: A Research-Based Guide. WHERE CAN I FIND INFORMATION ABOUT DRUG TREATMENT PROGRAMS?  For referrals to treatment programs, visit the Substance Abuse and Mental Health Services Administration online at http://findtreatment.http://gonzalez-rivas.net/.   NIDA publishes an expanding series of treatment manuals, the "clinical toolbox," that gives drug treatment providers research-based information for creating effective treatment programs.  WHAT IS DETOXIFICATION, OR "DETOX"? Detoxification is the process of allowing the body to rid itself of a drug while managing the symptoms of withdrawal. It is often the first step in a drug treatment program and should be followed by treatment with a behavioral-based therapy and/or a medication, if available. Detox alone with no follow-up is not treatment.   WHAT IS WITHDRAWAL? HOW LONG DOES IT LAST? Withdrawal is the variety of symptoms that occur after use of some addictive drugs is reduced or stopped. Length of withdrawal and symptoms vary with the type of drug. For example, physical symptoms of heroin withdrawal may include restlessness, muscle and bone pain, insomnia, diarrhea, vomiting, and cold flashes. These physical symptoms may last for several days, but the general depression, or dysphoria (opposite of euphoria), that often accompanies heroin withdrawal, may last for weeks. In many cases withdrawal can be easily treated with medications to ease the symptoms. But treating withdrawal is not the same as treating addiction.  WHAT ARE THE COSTS OF DRUG ABUSE TO SOCIETY? Beyond the raw numbers are other costs to society:  Spread of infectious diseases such as HIV/AIDS and hepatitis C either through sharing of drug paraphernalia or unprotected sex.   Deaths due to overdose or other complications from drug use.   Effects on unborn children of pregnant drug users.   Other effects such as crime and homelessness.  IF A PREGNANT WOMAN ABUSES DRUGS, DOES IT AFFECT THE FETUS?  Many substances including alcohol, nicotine, and drugs of abuse can have negative effects on the developing fetus because they are transferred to the fetus across the placenta. For example, nicotine has been connected with premature birth and low birth weight, as has the use of cocaine.  Scientific studies have shown that babies born to marijuana users were shorter, weighed less, and had smaller head sizes than those born to mothers who did not use the drug. Smaller babies are more likely to develop health problems.   Whether a baby's health problems, if caused by a drug, will continue as the child grows, is not always known. Research does show that children born to mothers who used marijuana regularly during pregnancy may have trouble concentrating, when older. Our research  continues to produce insights on the negative effects of drug use on the fetus.  Document Released: 03/30/2003 Document Revised: 03/16/2011 Document Reviewed: 06/26/2008 [facility] Patient Information 2012 [place], [place].Polysubstance Abuse When people abuse more than one drug or type of drug it is called polysubstance or polydrug abuse. For example, many smokers also drink alcohol. This is one form of polydrug abuse. Polydrug abuse also refers to the use of a drug to counteract an unpleasant effect produced by another drug. It may also be used to help with withdrawal from another drug. People who take stimulants may become agitated. Sometimes this agitation is countered with a tranquilizer. This helps protect against the unpleasant side effects. Polydrug abuse also refers to the use of different drugs at the same time.  Anytime drug use is interfering with normal living activities, it has become abuse. This includes problems with family and friends. Psychological dependence has developed when your mind tells you that the drug is needed. This is usually followed by physical dependence which has developed when continuing increases of drug are required to get the same feeling or "high". This is known as addiction or chemical dependency. A person's risk is much higher if there is a history of chemical dependency in the family. SIGNS OF CHEMICAL DEPENDENCY  You have been told by friends or family that drugs have become a problem.   You fight when using drugs.   You are having blackouts (not remembering what you do while using).   You feel sick from using drugs but continue using.   You lie about use or amounts of drugs (chemicals) used.   You need chemicals to get you going.   You are suffering in work performance or in school because of drug use.   You get sick from use of drugs but continue to use anyway.   You need drugs to relate to people or feel comfortable in social situations.   You use  drugs to forget problems.  "Yes" answered to any of the above signs of chemical dependency indicates there are problems. The longer the use of drugs continues, the greater the problems will become. If there is a family history of drug or alcohol use, it is best not to experiment with these drugs. Continual use leads to tolerance. After tolerance develops more of the drug is needed to get the same feeling. This is followed by addiction. With addiction, drugs become the most important part of life. It becomes more important to take drugs than participate in the other usual activities of life. This includes relating to friends and family. Addiction is followed by dependency. Dependency is a condition where drugs are now needed not just to get high, but to feel normal. Addiction cannot be cured but it can be stopped. This often requires outside help and the care of professionals. Treatment centers are listed in the yellow pages under: Cocaine, Narcotics, and Alcoholics Anonymous. Most hospitals and clinics can refer you to a specialized care center. Talk to your  caregiver if you need help. Document Released: 11/16/2004 Document Revised: 03/16/2011 Document Reviewed: 03/27/2005 [facility] Patient Information 2012 [place], [place].  RESOURCE GUIDE  Dental Problems  Patients with Medicaid: [facility] [phone] [address]                                           [phone] [address] Phone:  [phone]                                                  Phone:  [phone]  If unable to pay or uninsured, contact:  [facility] or [facility]. to become qualified for the adult dental clinic.  Chronic Pain Problems Contact [facility]  [phone] Patients need to be referred by their primary care doctor.  Insufficient Money for Medicine Contact [employer]:  call "211" or [facility] [phone].  No Primary Care  Doctor Call [facility]  [phone] Other agencies that provide inexpensive medical care    [facility]  [phone]    [facility]  [phone]    [facility]  [phone]    [facility]  [phone]    [facility]  7034411998    [facility]  [phone]  [facility]  [phone] [facility]  [phone] [facility] Mental Health   [phone] (emergency services [phone])  Substance Abuse Resources Alcohol and Drug Services  [phone] [facility] [phone] [facility] [place] [phone] Floydene Flock [phone] Residential & Outpatient Substance Abuse Program  [phone]  Abuse/Neglect [facility] Child Abuse Hotline [phone] [facility] Child Abuse Hotline 6314208057 (After Hours)  Emergency Shelter [facility] 7875060179  Maternity Homes [facility] at the [place] [facility] [place] [phone] [facility] [phone]  MRSA Hotline #:   765-210-4791    [facility] Resources  [facility]     [employer]                          [facility] Dept. [address] [place]                       [address]      [address]  [place]                                                Cristobal Goldmann Phone:  [phone]                                   Phone:  [phone]  Phone:  [phone]  [facility] Mental Health Phone:  [phone]  [facility] Child Abuse Hotline [phone] (After Hours)

## 2011-08-25 NOTE — ED Notes (Signed)
Pt said if she can't go to [facility] or [facility] then she just wants to sign herself out because the other places she's been to before and they don't help her at all. Told her writer will let SW know. Pt up to nursing station asking for something else for agitation before she's forced to act out because the medications aren't helping with her withdrawal symptoms, which are, sweating, cold chills, nausea and vomit, diarrhea. Pt says as soon as she eats she goes to the bathroom because it either makes her nauseated her have diarrhea. Pt also wants neosporin for cut/scrape on her leg.

## 2011-08-25 NOTE — ED Notes (Signed)
Pt reports that she came to the hospital to get help to stop abusing xanax and klonopin which she's been abusing for the last 2-3 months which she's been buying off the street. Pt reports that she also abuses ETOH and an herbal drink as well as cocaine. Explained the dangers of mixing ETOH with benzos and street drugs as well. Pt indicated she was aware of the dangers. Pt reports being off her medications for about 6 months due to them not helping with her anxiety anyway. Pt admits that the abuse really isn't controlling her anxiety either and that it is controlled when she takes her medications as prescribed. Pt indicated that she has suicidal thoughts all the time and has for years but she will not try to kill herself or act on those thoughts in the hospital. Pt reports wrapping the cords around her neck and banging her head here at hospital last night due to being aggravated and agitated. Pt requested a PRN for indigestion, she reports that she uses Prevacid at home.

## 2011-08-25 NOTE — ED Notes (Addendum)
Pt talked with Clinical research associate and SW in the hallway, she was told that she was accepted at [facility] and we'd provide transportation for her to go there today. Pt reports never having received treatment at [facility] and not wanting to go there for treatment today because it's too far away from her parents. Pt identified she's probably making the wrong decision but she's making it any way. Pt asked about seeing the EDP here before discharging to ask him about sending her home with a prescription for Librium so she can detox herself from the xanax she's been abusing. Pt talked about it always turning out the same way every time she comes to the ED and she just wants to go home because she's been treated poorly once, sent to [facility] on [facility] another time and also to [facility]. MD notified and he agreed to give pt a prescription for the Librium at discharge. Pt called her roommate for a ride home. She denies any SI/HI/AVH

## 2011-08-25 NOTE — ED Notes (Signed)
Pt's ride is here, got discharge instructions and prescription from the EDP. 1 personal bag of belongings and one duffel like bag given back to pt.

## 2011-08-25 NOTE — ED Provider Notes (Signed)
History     CSN: [id number]  Arrival date & time 08/24/11  2215   First MD Initiated Contact with Patient 08/25/11 0034      Chief Complaint  Patient presents with  . Agitation     (Consider location/radiation/quality/duration/timing/severity/associated sxs/prior treatment) HPI Level 5 Caveat: altered mental status. Is a 31 year old white female with a long-standing psychiatric history. She presented to the ER stating that she needed detox from Xanax. She admitted suicidal ideation to her triage nurse although she did not plan or homicidal ideation. Once placed in a room she became very agitated, uncooperative and attempting to wrap cords and other objects around her neck. She required Geodon IM and Benadryl IM to calm her. She was placed on a mattress on the floor for self protection. She is now sleeping comfortably.  Past Medical History  Diagnosis Date  . Depression   . Bipolar 1 disorder   . Anxiety     History reviewed. No pertinent past surgical history.  History reviewed. No pertinent family history.  History  Substance Use Topics  . Smoking status: Current Everyday Smoker  . Smokeless tobacco: Not on file  . Alcohol Use: Yes    OB History    Grav Para Term Preterm Abortions TAB SAB Ect Mult Living                  Review of Systems  Unable to perform ROS   Allergies  Review of patient's allergies indicates no known allergies.  Home Medications  No current outpatient prescriptions on file.  BP 147/103  Pulse 107  Temp(Src) 97.8 F (36.6 C) (Oral)  Resp 22  SpO2 99%  LMP 07/27/2011  Physical Exam General: Well-developed, well-nourished female in no acute distress; appearance consistent with age of record HENT: normocephalic, atraumatic Eyes: Normal appearance Neck: supple Heart: regular rate and rhythm Lungs: Normal respiratory effort and excursion Abdomen: soft; nondistended Extremities: No deformity; full range of motion Neurologic: Awake,  alert; motor function intact in all extremities and symmetric; no facial droop Skin: Warm and dry Psychiatric: Agitated; uncooperative    ED Course  Procedures (including critical care time)     MDM   Nursing notes and vitals signs, including pulse oximetry, reviewed.  Summary of this visit's results, reviewed by myself:  Labs:  Results for orders placed during the hospital encounter of 08/24/11  CBC      Component Value Range   WBC 8.3  4.0 - 10.5 (K/uL)   RBC 4.00  3.87 - 5.11 (MIL/uL)   Hemoglobin 13.3  12.0 - 15.0 (g/dL)   HCT 40.9  81.1 - 91.4 (%)   MCV 95.0  78.0 - 100.0 (fL)   MCH 33.3  26.0 - 34.0 (pg)   MCHC 35.0  30.0 - 36.0 (g/dL)   RDW 78.2  95.6 - 21.3 (%)   Platelets 384  150 - 400 (K/uL)  DIFFERENTIAL      Component Value Range   Neutrophils Relative 67  43 - 77 (%)   Neutro Abs 5.5  1.7 - 7.7 (K/uL)   Lymphocytes Relative 27  12 - 46 (%)   Lymphs Abs 2.2  0.7 - 4.0 (K/uL)   Monocytes Relative 6  3 - 12 (%)   Monocytes Absolute 0.5  0.1 - 1.0 (K/uL)   Eosinophils Relative 1  0 - 5 (%)   Eosinophils Absolute 0.1  0.0 - 0.7 (K/uL)   Basophils Relative 0  0 - 1 (%)  Basophils Absolute 0.0  0.0 - 0.1 (K/uL)  COMPREHENSIVE METABOLIC PANEL      Component Value Range   Sodium 137  135 - 145 (mEq/L)   Potassium 3.6  3.5 - 5.1 (mEq/L)   Chloride 105  96 - 112 (mEq/L)   CO2 20  19 - 32 (mEq/L)   Glucose, Bld 98  70 - 99 (mg/dL)   BUN 5 (*) 6 - 23 (mg/dL)   Creatinine, Ser 1.61  0.50 - 1.10 (mg/dL)   Calcium 8.9  8.4 - 09.6 (mg/dL)   Total Protein 7.4  6.0 - 8.3 (g/dL)   Albumin 4.1  3.5 - 5.2 (g/dL)   AST 16  0 - 37 (U/L)   ALT 16  0 - 35 (U/L)   Alkaline Phosphatase 65  39 - 117 (U/L)   Total Bilirubin 0.2 (*) 0.3 - 1.2 (mg/dL)   GFR calc non Af Amer >90  >90 (mL/min)   GFR calc Af Amer >90  >90 (mL/min)  ETHANOL      Component Value Range   Alcohol, Ethyl (B) 91 (*) 0 - 11 (mg/dL)  URINE RAPID DRUG SCREEN (HOSP PERFORMED)      Component Value  Range   Opiates NONE DETECTED  NONE DETECTED    Cocaine NONE DETECTED  NONE DETECTED    Benzodiazepines POSITIVE (*) NONE DETECTED    Amphetamines NONE DETECTED  NONE DETECTED    Tetrahydrocannabinol POSITIVE (*) NONE DETECTED    Barbiturates NONE DETECTED  NONE DETECTED   POCT PREGNANCY, URINE      Component Value Range   Preg Test, Ur NEGATIVE  NEGATIVE     Imaging Studies: No results found.          Hanley Seamen, MD 08/25/11 [phone]

## 2011-08-25 NOTE — ED Notes (Signed)
Pt continues to try to wrap room chords around neck.  Sitter watching pt and able to stop pt actions.  Removed stretcher from room and placed stretcher pad on floor for patient safety.

## 2011-08-25 NOTE — ED Notes (Signed)
Notified ACT team pt awake.

## 2011-08-25 NOTE — ED Notes (Addendum)
Late Entry: 4:15pm  CSW was approached by pt stating that she would like information on where she may be placed. CSW explained that she had been declined at [facility] though other placement was being sought at [facility] and [facility]. Pt stated that she does not want to go to [facility] or [facility] and stated that she would prefer to go home. CSW explained that she would have to talk with MD to discuss that. Pt was agreeable at that time.  EDP was notified and stated that he would talk with pt regarding her desire to discharge.   CSW received a call from [facility] stating that a "guilford bed" may become available within the next few days. CSW contacted [facility] to determine bed availability at pt's request, currently awaiting a return call. CSW will continue to follow.

## 2011-08-25 NOTE — ED Notes (Signed)
Pt had a 3rd bag similar to the duffel bag in the activity room that was given to her at discharge as well.

## 2011-08-25 NOTE — ED Notes (Signed)
Pt up to nurses station inquiring about signing herself out because this is taking too long. Told pt the psychiatrist' recommendations for inpatient treatment. She asked if I knew where and was told no it's being worked on. Also, asked if she wanted writer to notify the MD that she wants to sign herself out of the hospital but she said no. Reminded that we talked earlier and she was told the process and that it's not always timely. Pt said she hates the waiting part but she does want to continue to wait for placement.

## 2011-08-25 NOTE — ED Notes (Signed)
Went over discharge instructions with pt and pt walked out by tech and security with her 2 bags of belongings.
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
## 2011-08-25 NOTE — ED Provider Notes (Signed)
Pt wants to leave. Here voluntarily for benzo abuse. Denies SI but was making gestures when placed in ED such as putting cord around her neck. Pt generally acting out and being difficult. Denies wanting to actually hurt self. Calmer now. Attempted to find her placement but only agreeable to going to [facility] which doesn't seem like possibility at this time. Do not feel she needs to be IVC'd.  Raeford Razor, MD 08/25/11 [phone]

## 2011-08-25 NOTE — ED Notes (Signed)
Pt signed a release of information for her parents and her ex-girlfriend to be able to get updates about her while she's here because she only gets the three phone calls per day. She spoke to both her parents and used up her last phone call for the day.

## 2011-09-15 ENCOUNTER — Encounter (HOSPITAL_COMMUNITY): Payer: Self-pay | Admitting: *Deleted

## 2011-09-15 ENCOUNTER — Emergency Department (HOSPITAL_COMMUNITY)
Admission: EM | Admit: 2011-09-15 | Discharge: 2011-09-16 | Disposition: A | Discharge status: Home / Self Care | Payer: Self-pay | Attending: Emergency Medicine | Admitting: Emergency Medicine

## 2011-09-15 DIAGNOSIS — F1520 Other stimulant dependence, uncomplicated: Secondary | ICD-10-CM | POA: Insufficient documentation

## 2011-09-15 DIAGNOSIS — F13239 Sedative, hypnotic or anxiolytic dependence with withdrawal, unspecified: Secondary | ICD-10-CM

## 2011-09-15 DIAGNOSIS — F17200 Nicotine dependence, unspecified, uncomplicated: Secondary | ICD-10-CM | POA: Insufficient documentation

## 2011-09-15 DIAGNOSIS — F319 Bipolar disorder, unspecified: Secondary | ICD-10-CM | POA: Insufficient documentation

## 2011-09-15 DIAGNOSIS — F419 Anxiety disorder, unspecified: Secondary | ICD-10-CM

## 2011-09-15 DIAGNOSIS — F19939 Other psychoactive substance use, unspecified with withdrawal, unspecified: Secondary | ICD-10-CM | POA: Insufficient documentation

## 2011-09-15 DIAGNOSIS — F411 Generalized anxiety disorder: Secondary | ICD-10-CM | POA: Insufficient documentation

## 2011-09-15 HISTORY — DX: Other psychoactive substance abuse, uncomplicated: F19.10

## 2011-09-15 LAB — URINALYSIS, ROUTINE W REFLEX MICROSCOPIC
Bilirubin Urine: NEGATIVE
Glucose, UA: NEGATIVE mg/dL
Ketones, ur: NEGATIVE mg/dL
Leukocytes, UA: NEGATIVE
Nitrite: NEGATIVE
Protein, ur: NEGATIVE mg/dL
Specific Gravity, Urine: 1.018 (ref 1.005–1.030)
Urobilinogen, UA: 0.2 mg/dL (ref 0.0–1.0)
pH: 6 (ref 5.0–8.0)

## 2011-09-15 LAB — DIFFERENTIAL
Basophils Absolute: 0 10*3/uL (ref 0.0–0.1)
Basophils Relative: 1 % (ref 0–1)
Eosinophils Absolute: 0.2 10*3/uL (ref 0.0–0.7)
Eosinophils Relative: 2 % (ref 0–5)
Lymphocytes Relative: 32 % (ref 12–46)
Lymphs Abs: 2.3 10*3/uL (ref 0.7–4.0)
Monocytes Absolute: 0.5 10*3/uL (ref 0.1–1.0)
Monocytes Relative: 7 % (ref 3–12)
Neutro Abs: 4.3 10*3/uL (ref 1.7–7.7)
Neutrophils Relative %: 59 % (ref 43–77)

## 2011-09-15 LAB — RAPID URINE DRUG SCREEN, HOSP PERFORMED
Amphetamines: NOT DETECTED
Barbiturates: POSITIVE — AB
Benzodiazepines: NOT DETECTED
Cocaine: NOT DETECTED
Opiates: NOT DETECTED
Tetrahydrocannabinol: POSITIVE — AB

## 2011-09-15 LAB — COMPREHENSIVE METABOLIC PANEL
ALT: 16 U/L (ref 0–35)
AST: 18 U/L (ref 0–37)
Albumin: 4.1 g/dL (ref 3.5–5.2)
Alkaline Phosphatase: 66 U/L (ref 39–117)
BUN: 12 mg/dL (ref 6–23)
CO2: 19 mEq/L (ref 19–32)
Calcium: 9.4 mg/dL (ref 8.4–10.5)
Chloride: 104 mEq/L (ref 96–112)
Creatinine, Ser: 0.69 mg/dL (ref 0.50–1.10)
GFR calc Af Amer: >90 mL/min (ref >90)
GFR calc non Af Amer: >90 mL/min (ref >90)
Glucose, Bld: 102 mg/dL — ABNORMAL HIGH (ref 70–99)
Potassium: 4 mEq/L (ref 3.5–5.1)
Sodium: 137 mEq/L (ref 135–145)
Total Bilirubin: 0.2 mg/dL — ABNORMAL LOW (ref 0.3–1.2)
Total Protein: 7.4 g/dL (ref 6.0–8.3)

## 2011-09-15 LAB — CBC
HCT: 37.7 % (ref 36.0–46.0)
Hemoglobin: 13.1 g/dL (ref 12.0–15.0)
MCH: 33.2 pg (ref 26.0–34.0)
MCHC: 34.7 g/dL (ref 30.0–36.0)
MCV: 95.4 fL (ref 78.0–100.0)
Platelets: 287 10*3/uL (ref 150–400)
RBC: 3.95 MIL/uL (ref 3.87–5.11)
RDW: 12.8 % (ref 11.5–15.5)
WBC: 7.3 10*3/uL (ref 4.0–10.5)

## 2011-09-15 LAB — PREGNANCY, URINE: Preg Test, Ur: NEGATIVE

## 2011-09-15 LAB — ETHANOL: Alcohol, Ethyl (B): <11 mg/dL (ref 0–11)

## 2011-09-15 LAB — URINE MICROSCOPIC-ADD ON

## 2011-09-15 NOTE — ED Provider Notes (Signed)
History    31 year old female presenting with anxiety. Patient said she is withdrawing from benzodiazepines. She feels "shaky and fluttering in my stomach. Patient described out of a detox facility after a two-week stay. She denies using since then. She is currently being treated as an outpatient. She wants to know if there is anything more that can be done for her symptoms. Denies suicidal or homicidal ideation. No hallucinations. CSN: [id number]  Arrival date & time 09/15/11  2210   First MD Initiated Contact with Patient 09/15/11 2349      Chief Complaint  Patient presents with  . detox     (Consider location/radiation/quality/duration/timing/severity/associated sxs/prior treatment) HPI  Past Medical History  Diagnosis Date  . Depression   . Bipolar 1 disorder   . Anxiety   . Drug abuse     History reviewed. No pertinent past surgical history.  No family history on file.  History  Substance Use Topics  . Smoking status: Current Everyday Smoker  . Smokeless tobacco: Not on file  . Alcohol Use: Yes    OB History    Grav Para Term Preterm Abortions TAB SAB Ect Mult Living                  Review of Systems   Review of symptoms negative unless otherwise noted in HPI.   Allergies  Review of patient's allergies indicates no known allergies.  Home Medications   Current Outpatient Rx  Name Route Sig Dispense Refill  . ARIPIPRAZOLE 10 MG PO TABS Oral Take 10 mg by mouth daily.    . ASPIRIN-ACETAMINOPHEN 500-325 MG PO PACK Oral Take 1 packet by mouth daily.    Marland Kitchen GABAPENTIN 300 MG PO CAPS Oral Take 600 mg by mouth 3 (three) times daily as needed. For withdraw from xanax      BP 142/98  Pulse 112  Temp(Src) 98.4 F (36.9 C) (Oral)  Resp 16  SpO2 97%  LMP 08/26/2011  Physical Exam  Nursing note and vitals reviewed. Constitutional: She is oriented to person, place, and time. She appears well-developed and well-nourished. No distress.       Sitting up in bed.  No acute distress.  Anxious appearing.  HENT:  Head: Normocephalic and atraumatic.  Eyes: Conjunctivae are normal. Pupils are equal, round, and reactive to light. Right eye exhibits no discharge. Left eye exhibits no discharge.  Neck: Neck supple.  Cardiovascular: Normal rate, regular rhythm and normal heart sounds.  Exam reveals no gallop and no friction rub.   No murmur heard.      Mildly tachycardic  Pulmonary/Chest: Effort normal and breath sounds normal. No respiratory distress.  Abdominal: Soft. She exhibits no distension. There is no tenderness.  Musculoskeletal: She exhibits no edema and no tenderness.  Neurological: She is alert and oriented to person, place, and time. No cranial nerve deficit. She exhibits normal muscle tone. Coordination normal.  Skin: Skin is warm and dry.  Psychiatric: Her behavior is normal. Thought content normal.    ED Course  Procedures (including critical care time)  Labs Reviewed  URINALYSIS, ROUTINE W REFLEX MICROSCOPIC - Abnormal; Notable for the following:    APPearance CLOUDY (*)    Hgb urine dipstick SMALL (*)    All other components within normal limits  URINE RAPID DRUG SCREEN (HOSP PERFORMED) - Abnormal; Notable for the following:    Tetrahydrocannabinol POSITIVE (*)    Barbiturates POSITIVE (*)    All other components within normal limits  COMPREHENSIVE METABOLIC  PANEL - Abnormal; Notable for the following:    Glucose, Bld 102 (*)    Total Bilirubin 0.2 (*)    All other components within normal limits  URINE MICROSCOPIC-ADD ON - Abnormal; Notable for the following:    Squamous Epithelial / LPF FEW (*)    Bacteria, UA FEW (*)    All other components within normal limits  PREGNANCY, URINE  CBC  DIFFERENTIAL  ETHANOL   No results found.   1. Anxiety   2. Benzodiazepine withdrawal       MDM  31 year old female with anxiety, likely secondary to benzodiazepine withdrawal. Patient is already on outpatient treatment regimen for  this. Discharged after a two-week stay at a rehabilitation facility. Low concern for like that life threatening withdrawal with far out. Drug screen was negative for benzodiazepines. Discussed with patient that withdrawal symptoms may continue for up to several more weeks, but should start to improve more. Return precautions were discussed. Further outpatient followup        Raeford Razor, MD 09/19/11 [phone]

## 2011-09-15 NOTE — ED Notes (Signed)
She was  In [place] for detox 2 weeks ago

## 2011-09-15 NOTE — ED Notes (Signed)
She wants  Detox from xanax.  She was using heavy and she has not been getting any for 2 weeks.  Now she nervous jittery rapid speech..  lmp may

## 2011-09-16 NOTE — ED Notes (Signed)
Pt refused testing and further plan of care, pt states "if he can't do anything for me then why am I here. My doctor already gave me neurotin so if he not got do nothing then i want to leave". MD made aware and to follow up.

## 2011-09-16 NOTE — ED Notes (Signed)
Pt denies any questions upon discharge. 

## 2011-09-16 NOTE — Discharge Instructions (Signed)
Anxiety and Panic Attacks Anxiety is your body's way of reacting to real danger or something you think is a danger. It may be fear or worry over a situation like losing your job. Sometimes the cause is not known. A panic attack is made up of physical signs like sweating, shaking, or chest pain. Anxiety and panic attacks may start suddenly. They may be strong. They may come at any time of day, even while sleeping. They may come at any time of life. Panic attacks are scary, but they do not harm you physically.  HOME CARE  Avoid any known causes of your anxiety.   Try to relax. Yoga may help. Tell yourself everything will be okay.   Exercise often.   Get expert advice and help (therapy) to stop anxiety or attacks from happening.   Avoid caffeine, alcohol, and drugs.   Only take medicine as told by your doctor.  GET HELP RIGHT AWAY IF:  Your attacks seem different than normal attacks.   Your problems are getting worse or concern you.  MAKE SURE YOU:  Understand these instructions.   Will watch your condition.   Will get help right away if you are not doing well or get worse.  Document Released: 04/29/2010 Document Revised: 03/16/2011 Document Reviewed: 04/29/2010 [employer] Patient Information 2012 [employer]. 

## 2011-11-21 ENCOUNTER — Emergency Department (HOSPITAL_COMMUNITY)
Admission: EM | Admit: 2011-11-21 | Discharge: 2011-11-22 | Discharge status: Against Medical Advice | Payer: Self-pay | Attending: Emergency Medicine | Admitting: Emergency Medicine

## 2011-11-21 ENCOUNTER — Emergency Department (HOSPITAL_COMMUNITY)
Admission: EM | Admit: 2011-11-21 | Discharge: 2011-11-21 | Disposition: A | Discharge status: Home / Self Care | Payer: Self-pay | Attending: Emergency Medicine | Admitting: Emergency Medicine

## 2011-11-21 ENCOUNTER — Encounter (HOSPITAL_COMMUNITY): Payer: Self-pay | Admitting: Emergency Medicine

## 2011-11-21 ENCOUNTER — Encounter (HOSPITAL_COMMUNITY): Payer: Self-pay

## 2011-11-21 DIAGNOSIS — Z79899 Other long term (current) drug therapy: Secondary | ICD-10-CM | POA: Insufficient documentation

## 2011-11-21 DIAGNOSIS — F1910 Other psychoactive substance abuse, uncomplicated: Secondary | ICD-10-CM | POA: Insufficient documentation

## 2011-11-21 DIAGNOSIS — F17200 Nicotine dependence, unspecified, uncomplicated: Secondary | ICD-10-CM | POA: Insufficient documentation

## 2011-11-21 DIAGNOSIS — F319 Bipolar disorder, unspecified: Secondary | ICD-10-CM | POA: Insufficient documentation

## 2011-11-21 DIAGNOSIS — F341 Dysthymic disorder: Secondary | ICD-10-CM | POA: Insufficient documentation

## 2011-11-21 LAB — COMPREHENSIVE METABOLIC PANEL
ALT: 15 U/L (ref 0–35)
AST: 14 U/L (ref 0–37)
AST: 15 U/L (ref 0–37)
Alkaline Phosphatase: 80 U/L (ref 39–117)
BUN: 9 mg/dL (ref 6–23)
CO2: 21 mEq/L (ref 19–32)
Calcium: 9.2 mg/dL (ref 8.4–10.5)
Chloride: 102 mEq/L (ref 96–112)
Creatinine, Ser: 0.68 mg/dL (ref 0.50–1.10)
GFR calc Af Amer: >90 mL/min (ref >90)
GFR calc non Af Amer: >90 mL/min (ref >90)
Glucose, Bld: 92 mg/dL (ref 70–99)
Potassium: 3.5 mEq/L (ref 3.5–5.1)
Sodium: 136 mEq/L (ref 135–145)
Total Bilirubin: 0.2 mg/dL — ABNORMAL LOW (ref 0.3–1.2)
Total Protein: 7 g/dL (ref 6.0–8.3)

## 2011-11-21 LAB — CBC WITH DIFFERENTIAL/PLATELET
Basophils Absolute: 0 10*3/uL (ref 0.0–0.1)
Eosinophils Absolute: 0.1 10*3/uL (ref 0.0–0.7)
Eosinophils Relative: 1 % (ref 0–5)
Lymphocytes Relative: 42 % (ref 12–46)
Lymphs Abs: 2.8 10*3/uL (ref 0.7–4.0)
MCH: 33.5 pg (ref 26.0–34.0)
MCV: 94.3 fL (ref 78.0–100.0)
Neutrophils Relative %: 49 % (ref 43–77)
Platelets: 361 10*3/uL (ref 150–400)
RBC: 3.85 MIL/uL — ABNORMAL LOW (ref 3.87–5.11)
RDW: 13 % (ref 11.5–15.5)
WBC: 6.7 10*3/uL (ref 4.0–10.5)

## 2011-11-21 LAB — ETHANOL: Alcohol, Ethyl (B): <11 mg/dL (ref 0–11)

## 2011-11-21 LAB — RAPID URINE DRUG SCREEN, HOSP PERFORMED
Amphetamines: NOT DETECTED
Barbiturates: NOT DETECTED
Cocaine: NOT DETECTED
Tetrahydrocannabinol: NOT DETECTED

## 2011-11-21 LAB — CBC
HCT: 37.3 % (ref 36.0–46.0)
Hemoglobin: 12.8 g/dL (ref 12.0–15.0)
MCH: 32.8 pg (ref 26.0–34.0)
MCV: 95.6 fL (ref 78.0–100.0)
RBC: 3.9 MIL/uL (ref 3.87–5.11)
WBC: 7.5 10*3/uL (ref 4.0–10.5)

## 2011-11-21 LAB — PREGNANCY, URINE
Preg Test, Ur: NEGATIVE
Preg Test, Ur: NEGATIVE

## 2011-11-21 LAB — ACETAMINOPHEN LEVEL: Acetaminophen (Tylenol), Serum: <15 ug/mL (ref 10–30)

## 2011-11-21 LAB — POCT PREGNANCY, URINE: Preg Test, Ur: NEGATIVE

## 2011-11-21 MED ORDER — IBUPROFEN 200 MG PO TABS: 600.0000 mg | ORAL_TABLET | Freq: Three times a day (TID) | ORAL | Status: DC | PRN

## 2011-11-21 MED ORDER — GABAPENTIN 300 MG PO CAPS
600.0000 mg | ORAL_CAPSULE | Freq: Four times a day (QID) | ORAL | Status: DC
  Filled 2011-11-21 (×5): qty 2

## 2011-11-21 MED ORDER — ZOLPIDEM TARTRATE 5 MG PO TABS: 5.0000 mg | ORAL_TABLET | Freq: Every evening | ORAL | Status: DC | PRN

## 2011-11-21 MED ORDER — ACETAMINOPHEN 325 MG PO TABS: 650.0000 mg | ORAL_TABLET | ORAL | Status: DC | PRN

## 2011-11-21 MED ORDER — QUETIAPINE FUMARATE 100 MG PO TABS
200.0000 mg | ORAL_TABLET | Freq: Two times a day (BID) | ORAL | Status: DC
  Administered 2011-11-22: 200 mg via ORAL
  Filled 2011-11-21: qty 2

## 2011-11-21 MED ORDER — SODIUM CHLORIDE 0.9 % IV BOLUS (SEPSIS): 1000.0000 mL | Freq: Once | INTRAVENOUS | Status: DC

## 2011-11-21 MED ORDER — NICOTINE 21 MG/24HR TD PT24: 21.0000 mg | MEDICATED_PATCH | Freq: Every day | TRANSDERMAL | Status: DC

## 2011-11-21 MED ORDER — BUSPIRONE HCL 10 MG PO TABS
10.0000 mg | ORAL_TABLET | Freq: Three times a day (TID) | ORAL | Status: DC
  Administered 2011-11-22: 10 mg via ORAL
  Filled 2011-11-21: qty 1

## 2011-11-21 NOTE — ED Notes (Signed)
AC called. Sitter coming at 2300. Security called to wand pt. Security confirmed they are on their way.

## 2011-11-21 NOTE — ED Notes (Signed)
ZOX:WRUE<AV> Expected date:11/21/11<BR> Expected time:12:20 AM<BR> Means of arrival:Ambulance<BR> Comments:<BR> overdose

## 2011-11-21 NOTE — ED Notes (Signed)
Pt states "I need detox from klonopin, alcohol, xanax, and marijuana."  Reports taking 10- 2mg  of klonopin, 10 blue xanax pills, 6 beers, and marijuana today around 4p. Hx of substance and alcohol abuse.  Pt was discharged from here early this am around 7a.

## 2011-11-21 NOTE — ED Notes (Signed)
Pt reports 10 Clonopin 2 mg each at 1745, 10 Xanax (blue) at 1745, Alcohol (6-8 beers can) at 1745, and $10 worth of marijuana at 2100.

## 2011-11-21 NOTE — ED Provider Notes (Signed)
History     CSN: [id number]  Arrival date & time 11/21/11  0027   First MD Initiated Contact with Patient 11/21/11 0056      Chief Complaint  Patient presents with  . Drug Overdose   HPI  History provided by the patient and friend. Patient is a 31 year old female with history of anxiety, depression, bipolar disorder and drug abuse who presents with concerns for SI and potential overdose. Patient was brought in by EMS in [facility]. Patient's friend states that she was called by the patient and patient's claimed that she felt like she wanted to kill herself and admitted to taking a sugar medications today. Patient does admit to taking extra Klonopin at attempt to get "high". She reports taking up to 10 mg tabs at 2 different times throughout the day and evening. Patient also admits to drinking alcohol during this time. Patient states that this was not a suicide attempt. Patient also states that she does not remember telling her friend she wanted to kill herself. Patient does report prior history of intentional overdose in the past. Patient currently being followed at [facility] reports being seen there one week ago. She denies any changes in medication. Patient is on Seroquel reports taking her normal dose this morning and evening. She denies any other drug use. Denies any HI.    Past Medical History  Diagnosis Date  . Depression   . Bipolar 1 disorder   . Anxiety   . Drug abuse     History reviewed. No pertinent past surgical history.  No family history on file.  History  Substance Use Topics  . Smoking status: Current Everyday Smoker  . Smokeless tobacco: Not on file  . Alcohol Use: Yes    OB History    Grav Para Term Preterm Abortions TAB SAB Ect Mult Living                  Review of Systems  Respiratory: Negative for shortness of breath.   Cardiovascular: Negative for chest pain.  Gastrointestinal: Negative for nausea, vomiting and abdominal pain.  Psychiatric/Behavioral:  Positive for suicidal ideas. Negative for confusion and self-injury.    Allergies  Review of patient's allergies indicates no known allergies.  Home Medications   Current Outpatient Rx  Name Route Sig Dispense Refill  . ARIPIPRAZOLE 10 MG PO TABS Oral Take 10 mg by mouth daily.    . ASPIRIN-ACETAMINOPHEN 500-325 MG PO PACK Oral Take 1 packet by mouth daily.    . BUSPIRONE HCL 10 MG PO TABS Oral Take 10 mg by mouth 3 (three) times daily.    Marland Kitchen GABAPENTIN 300 MG PO CAPS Oral Take 600 mg by mouth 4 (four) times daily. For withdraw from xanax     . IBUPROFEN 800 MG PO TABS Oral Take 800 mg by mouth every 8 (eight) hours as needed. Pain    . QUETIAPINE FUMARATE 200 MG PO TABS Oral Take 200 mg by mouth 2 (two) times daily.      BP 115/75  Pulse 113  Temp 98.6 F (37 C) (Oral)  Resp 25  SpO2 98%  Physical Exam  Nursing note and vitals reviewed. Constitutional: She is oriented to person, place, and time. She appears well-developed and well-nourished. No distress.  HENT:  Head: Normocephalic.  Mouth/Throat: Oropharynx is clear and moist.  Eyes: Conjunctivae and EOM are normal. Pupils are equal, round, and reactive to light.  Cardiovascular: Regular rhythm.  Tachycardia present.   No murmur heard.  Pulmonary/Chest: Effort normal and breath sounds normal. No respiratory distress. She has no wheezes.  Abdominal: Soft. There is no tenderness.  Neurological: She is alert and oriented to person, place, and time.  Skin: Skin is warm and dry. No rash noted.  Psychiatric: She has a normal mood and affect. Her behavior is normal.    ED Course  Procedures   Results for orders placed during the hospital encounter of 11/21/11  URINE RAPID DRUG SCREEN (HOSP PERFORMED)      Component Value Range   Opiates NONE DETECTED  NONE DETECTED   Cocaine NONE DETECTED  NONE DETECTED   Benzodiazepines NONE DETECTED  NONE DETECTED   Amphetamines NONE DETECTED  NONE DETECTED   Tetrahydrocannabinol NONE  DETECTED  NONE DETECTED   Barbiturates NONE DETECTED  NONE DETECTED  PREGNANCY, URINE      Component Value Range   Preg Test, Ur NEGATIVE  NEGATIVE  SALICYLATE LEVEL      Component Value Range   Salicylate Lvl <2.0 (*) 2.8 - 20.0 mg/dL  CBC WITH DIFFERENTIAL      Component Value Range   WBC 6.7  4.0 - 10.5 K/uL   RBC 3.85 (*) 3.87 - 5.11 MIL/uL   Hemoglobin 12.9  12.0 - 15.0 g/dL   HCT 29.5  28.4 - 13.2 %   MCV 94.3  78.0 - 100.0 fL   MCH 33.5  26.0 - 34.0 pg   MCHC 35.5  30.0 - 36.0 g/dL   RDW 44.0  10.2 - 72.5 %   Platelets 361  150 - 400 K/uL   Neutrophils Relative 49  43 - 77 %   Neutro Abs 3.3  1.7 - 7.7 K/uL   Lymphocytes Relative 42  12 - 46 %   Lymphs Abs 2.8  0.7 - 4.0 K/uL   Monocytes Relative 7  3 - 12 %   Monocytes Absolute 0.5  0.1 - 1.0 K/uL   Eosinophils Relative 1  0 - 5 %   Eosinophils Absolute 0.1  0.0 - 0.7 K/uL   Basophils Relative 1  0 - 1 %   Basophils Absolute 0.0  0.0 - 0.1 K/uL  COMPREHENSIVE METABOLIC PANEL      Component Value Range   Sodium 136  135 - 145 mEq/L   Potassium 3.5  3.5 - 5.1 mEq/L   Chloride 101  96 - 112 mEq/L   CO2 25  19 - 32 mEq/L   Glucose, Bld 88  70 - 99 mg/dL   BUN 8  6 - 23 mg/dL   Creatinine, Ser 3.66  0.50 - 1.10 mg/dL   Calcium 9.2  8.4 - 44.0 mg/dL   Total Protein 7.0  6.0 - 8.3 g/dL   Albumin 3.6  3.5 - 5.2 g/dL   AST 14  0 - 37 U/L   ALT 15  0 - 35 U/L   Alkaline Phosphatase 80  39 - 117 U/L   Total Bilirubin 0.2 (*) 0.3 - 1.2 mg/dL   GFR calc non Af Amer >90  >90 mL/min   GFR calc Af Amer >90  >90 mL/min  ACETAMINOPHEN LEVEL      Component Value Range   Acetaminophen (Tylenol), Serum <15.0  10 - 30 ug/mL  ETHANOL      Component Value Range   Alcohol, Ethyl (B) 12 (*) 0 - 11 mg/dL        1. Substance abuse       MDM  Patient seen and  evaluated. Patient awake, alert and oriented x3.   Tele psych was performed by Dr. Reece Leader.  He feels pt is safe to be d/c home to outpatient care.      Date:  11/21/2011  Rate: 110  Rhythm: sinus tachycardia  QRS Axis: normal  Intervals: normal  ST/T Wave abnormalities: nonspecific ST/T changes  Conduction Disutrbances:none  Narrative Interpretation:   Old EKG Reviewed: none available    Angus Seller, [place] 11/21/11 2124

## 2011-11-21 NOTE — ED Notes (Signed)
Per EMS: Pt from home admits to taking pills all day. No response when asked if she was trying to hurt herself. Pt has been slow to respond, slurs words, AxOx4 with no deficits. ST w/ norm VS. Pt admits to taking Klonpin and seroquel and drinking alcohol. Pt reports to friends that she is going to take pills and admits to trying to kill herself.

## 2011-11-21 NOTE — ED Notes (Signed)
Poison Control called to get an update on pt status including labs, vitals and any interventions performed (fluids, benzos).

## 2011-11-21 NOTE — ED Notes (Signed)
Family at bedside. 

## 2011-11-21 NOTE — ED Notes (Addendum)
Poison control called. CJ recommends observation until not symptomatic, rule out Tylenol, and lab work. Benzos and alcohol both make a person sleepy and taken together will be a longer effect.

## 2011-11-21 NOTE — ED Notes (Signed)
Sitter at bedside.

## 2011-11-21 NOTE — ED Notes (Signed)
Informed pt that we needed her to get dressed in blue paper scrubs and pt informed of why. Pt refused to get undressed. CN made aware.

## 2011-11-22 ENCOUNTER — Encounter (HOSPITAL_COMMUNITY): Payer: Self-pay | Admitting: Emergency Medicine

## 2011-11-22 ENCOUNTER — Emergency Department (HOSPITAL_COMMUNITY)
Admission: EM | Admit: 2011-11-22 | Discharge: 2011-11-23 | Disposition: A | Discharge status: Other Acute Inpatient Hospital | Payer: Self-pay | Attending: Emergency Medicine | Admitting: Emergency Medicine

## 2011-11-22 DIAGNOSIS — F411 Generalized anxiety disorder: Secondary | ICD-10-CM | POA: Insufficient documentation

## 2011-11-22 DIAGNOSIS — F319 Bipolar disorder, unspecified: Secondary | ICD-10-CM | POA: Insufficient documentation

## 2011-11-22 DIAGNOSIS — Z79899 Other long term (current) drug therapy: Secondary | ICD-10-CM | POA: Insufficient documentation

## 2011-11-22 DIAGNOSIS — F1910 Other psychoactive substance abuse, uncomplicated: Secondary | ICD-10-CM | POA: Insufficient documentation

## 2011-11-22 DIAGNOSIS — F17200 Nicotine dependence, unspecified, uncomplicated: Secondary | ICD-10-CM | POA: Insufficient documentation

## 2011-11-22 NOTE — ED Provider Notes (Signed)
Medical screening examination/treatment/procedure(s) were performed by non-physician practitioner and as supervising physician I was immediately available for consultation/collaboration.  Olivia Mackie, MD 11/22/11 [phone]

## 2011-11-22 NOTE — ED Notes (Addendum)
Pt states she wants detox from marijuana, alcohol, Xanax and Klonopin. Pt states she took 15-20 Klonopin today, 15-20 Xanax and drank half a bottle of cough syrup today.  Pt does not admit SI, states "I dont know"

## 2011-11-22 NOTE — ED Notes (Signed)
Pt refused to sign AMA form.

## 2011-11-22 NOTE — ED Provider Notes (Signed)
History     CSN: [id number]  Arrival date & time 11/21/11  2007   First MD Initiated Contact with Patient 11/21/11 2313      Chief Complaint  Patient presents with  . Drug Overdose     The history is provided by the patient and medical records.   the patient reports a history of Klonopin alcohol Xanax and marijuana abuse and is requesting detox.  She has no homicidal or suicidal thoughts.  She was seen in emergency apartment last night for the same and was seen by telemetry psychiatry who recommended discharge home with outpatient resources.  The patient reports that she hasn't abuse issue and she went back to using again.  She's requesting detox.  She has no hallucinations.  She denies nausea or vomiting.  She has no abdominal pain chest pain shortness of breath.  Past Medical History  Diagnosis Date  . Depression   . Bipolar 1 disorder   . Anxiety   . Drug abuse     History reviewed. No pertinent past surgical history.  No family history on file.  History  Substance Use Topics  . Smoking status: Current Everyday Smoker  . Smokeless tobacco: Not on file  . Alcohol Use: Yes    OB History    Grav Para Term Preterm Abortions TAB SAB Ect Mult Living                  Review of Systems  All other systems reviewed and are negative.    Allergies  Review of patient's allergies indicates no known allergies.  Home Medications   Current Outpatient Rx  Name Route Sig Dispense Refill  . BUSPIRONE HCL 10 MG PO TABS Oral Take 10 mg by mouth 3 (three) times daily.    Marland Kitchen DOXYLAMINE SUCCINATE (SLEEP) 25 MG PO TABS Oral Take 25 mg by mouth at bedtime as needed. Sleep    . GABAPENTIN 300 MG PO CAPS Oral Take 600 mg by mouth 4 (four) times daily. For withdraw from xanax    . IBUPROFEN 800 MG PO TABS Oral Take 800 mg by mouth every 8 (eight) hours as needed. Pain    . QUETIAPINE FUMARATE 200 MG PO TABS Oral Take 200 mg by mouth 2 (two) times daily.      BP 92/74  Pulse 106   Temp 98 F (36.7 C) (Oral)  Resp 10  Ht 5\' 2"  (1.575 m)  Wt 205 lb (92.987 kg)  BMI 37.49 kg/m2  SpO2 97%  LMP 11/14/2011  Physical Exam  Nursing note and vitals reviewed. Constitutional: She is oriented to person, place, and time. She appears well-developed and well-nourished. No distress.  HENT:  Head: Normocephalic and atraumatic.  Eyes: EOM are normal.  Neck: Normal range of motion.  Cardiovascular: Normal rate, regular rhythm and normal heart sounds.   Pulmonary/Chest: Effort normal and breath sounds normal.  Abdominal: Soft. She exhibits no distension. There is no tenderness.  Musculoskeletal: Normal range of motion.  Neurological: She is alert and oriented to person, place, and time.  Skin: Skin is warm and dry.  Psychiatric: She has a normal mood and affect. Judgment normal.    ED Course  Procedures (including critical care time)  Labs Reviewed  COMPREHENSIVE METABOLIC PANEL - Abnormal; Notable for the following:    Total Bilirubin 0.2 (*)     All other components within normal limits  URINE RAPID DRUG SCREEN (HOSP PERFORMED) - Abnormal; Notable for the following:  Benzodiazepines POSITIVE (*)  DELTA CHECK NOTED   Tetrahydrocannabinol POSITIVE (*)  DELTA CHECK NOTED   All other components within normal limits  CBC  ETHANOL  ACETAMINOPHEN LEVEL  PREGNANCY, URINE  POCT PREGNANCY, URINE   No results found.   1. Substance abuse       MDM  The patient has no homicidal or suicidal thoughts on direct questioning.  The patient is here for substance abuse and requesting detox.  She became agitated when I told her I would not be giving her any benzodiazepines since part of her abuse pattern is Klonopin and Xanax.  She became upset and stated that she wanted to go home.  There is no indication for involuntary commitment.  I gave her outpatient resources for polysubstance abuse.  No HI or SI.  The patient was seen by psychiatry last night was recommended to be  discharged home at that time as well.        Lyanne Co, MD 11/22/11 [phone]

## 2011-11-22 NOTE — ED Notes (Signed)
Pt said to Dr. Patria Mane, "Fuck my paperwork" and walked out AMA.

## 2011-11-23 ENCOUNTER — Emergency Department (HOSPITAL_COMMUNITY)
Admission: EM | Admit: 2011-11-23 | Discharge: 2011-11-24 | Disposition: A | Discharge status: Other Acute Inpatient Hospital | Payer: Self-pay | Attending: Emergency Medicine | Admitting: Emergency Medicine

## 2011-11-23 DIAGNOSIS — F411 Generalized anxiety disorder: Secondary | ICD-10-CM

## 2011-11-23 DIAGNOSIS — F1010 Alcohol abuse, uncomplicated: Secondary | ICD-10-CM

## 2011-11-23 DIAGNOSIS — F1910 Other psychoactive substance abuse, uncomplicated: Secondary | ICD-10-CM | POA: Insufficient documentation

## 2011-11-23 DIAGNOSIS — X838XXA Intentional self-harm by other specified means, initial encounter: Secondary | ICD-10-CM

## 2011-11-23 DIAGNOSIS — F17200 Nicotine dependence, unspecified, uncomplicated: Secondary | ICD-10-CM | POA: Insufficient documentation

## 2011-11-23 DIAGNOSIS — F3110 Bipolar disorder, current episode manic without psychotic features, unspecified: Secondary | ICD-10-CM

## 2011-11-23 DIAGNOSIS — R000 Tachycardia, unspecified: Secondary | ICD-10-CM | POA: Insufficient documentation

## 2011-11-23 DIAGNOSIS — S61509A Unspecified open wound of unspecified wrist, initial encounter: Secondary | ICD-10-CM | POA: Insufficient documentation

## 2011-11-23 DIAGNOSIS — Z79899 Other long term (current) drug therapy: Secondary | ICD-10-CM | POA: Insufficient documentation

## 2011-11-23 DIAGNOSIS — F319 Bipolar disorder, unspecified: Secondary | ICD-10-CM | POA: Insufficient documentation

## 2011-11-23 DIAGNOSIS — X789XXA Intentional self-harm by unspecified sharp object, initial encounter: Secondary | ICD-10-CM | POA: Insufficient documentation

## 2011-11-23 LAB — ACETAMINOPHEN LEVEL: Acetaminophen (Tylenol), Serum: <15 ug/mL (ref 10–30)

## 2011-11-23 LAB — CBC WITH DIFFERENTIAL/PLATELET
Basophils Absolute: 0 10*3/uL (ref 0.0–0.1)
Basophils Relative: 0 % (ref 0–1)
Basophils Relative: 0 % (ref 0–1)
Eosinophils Absolute: 0 10*3/uL (ref 0.0–0.7)
Eosinophils Absolute: 0.1 10*3/uL (ref 0.0–0.7)
Eosinophils Relative: 0 % (ref 0–5)
Hemoglobin: 13.5 g/dL (ref 12.0–15.0)
Lymphocytes Relative: 29 % (ref 12–46)
Lymphs Abs: 2 10*3/uL (ref 0.7–4.0)
MCHC: 34.7 g/dL (ref 30.0–36.0)
MCV: 95.6 fL (ref 78.0–100.0)
Monocytes Relative: 5 % (ref 3–12)
Neutro Abs: 7.4 10*3/uL (ref 1.7–7.7)
Neutrophils Relative %: 74 % (ref 43–77)
Platelets: 359 10*3/uL (ref 150–400)
Platelets: 372 10*3/uL (ref 150–400)
RBC: 4.01 MIL/uL (ref 3.87–5.11)
RDW: 13.1 % (ref 11.5–15.5)
WBC: 10 10*3/uL (ref 4.0–10.5)
WBC: 8.7 10*3/uL (ref 4.0–10.5)

## 2011-11-23 LAB — POCT PREGNANCY, URINE: Preg Test, Ur: NEGATIVE

## 2011-11-23 LAB — COMPREHENSIVE METABOLIC PANEL
ALT: 16 U/L (ref 0–35)
ALT: 19 U/L (ref 0–35)
Albumin: 3.8 g/dL (ref 3.5–5.2)
Albumin: 3.8 g/dL (ref 3.5–5.2)
Alkaline Phosphatase: 74 U/L (ref 39–117)
Alkaline Phosphatase: 79 U/L (ref 39–117)
BUN: 7 mg/dL (ref 6–23)
BUN: 7 mg/dL (ref 6–23)
Chloride: 103 mEq/L (ref 96–112)
Chloride: 105 mEq/L (ref 96–112)
Glucose, Bld: 84 mg/dL (ref 70–99)
Glucose, Bld: 87 mg/dL (ref 70–99)
Potassium: 3.3 mEq/L — ABNORMAL LOW (ref 3.5–5.1)
Potassium: 3.7 mEq/L (ref 3.5–5.1)
Sodium: 138 mEq/L (ref 135–145)
Sodium: 139 mEq/L (ref 135–145)
Total Bilirubin: 0.2 mg/dL — ABNORMAL LOW (ref 0.3–1.2)
Total Bilirubin: 0.2 mg/dL — ABNORMAL LOW (ref 0.3–1.2)
Total Protein: 7.2 g/dL (ref 6.0–8.3)

## 2011-11-23 LAB — SALICYLATE LEVEL: Salicylate Lvl: <2 mg/dL — ABNORMAL LOW (ref 2.8–20.0)

## 2011-11-23 LAB — RAPID URINE DRUG SCREEN, HOSP PERFORMED
Amphetamines: NOT DETECTED
Barbiturates: NOT DETECTED
Benzodiazepines: NOT DETECTED

## 2011-11-23 LAB — ETHANOL
Alcohol, Ethyl (B): 57 mg/dL — ABNORMAL HIGH (ref 0–11)
Alcohol, Ethyl (B): 80 mg/dL — ABNORMAL HIGH (ref 0–11)

## 2011-11-23 MED ORDER — LORAZEPAM 2 MG/ML IJ SOLN: 1.0000 mg | Freq: Four times a day (QID) | INTRAMUSCULAR | Status: DC | PRN

## 2011-11-23 MED ORDER — PANTOPRAZOLE SODIUM 40 MG PO TBEC
40.0000 mg | DELAYED_RELEASE_TABLET | Freq: Every day | ORAL | Status: DC
  Administered 2011-11-23: 40 mg via ORAL
  Filled 2011-11-23: qty 1

## 2011-11-23 MED ORDER — THIAMINE HCL 100 MG/ML IJ SOLN: 100.0000 mg | Freq: Every day | INTRAMUSCULAR | Status: DC

## 2011-11-23 MED ORDER — ZIPRASIDONE MESYLATE 20 MG IM SOLR
20.0000 mg | Freq: Once | INTRAMUSCULAR | Status: AC
  Administered 2011-11-23: 20 mg via INTRAMUSCULAR

## 2011-11-23 MED ORDER — MIDAZOLAM HCL 2 MG/2ML IJ SOLN
INTRAMUSCULAR | Status: AC
  Filled 2011-11-23: qty 4

## 2011-11-23 MED ORDER — MIDAZOLAM HCL 2 MG/2ML IJ SOLN
4.0000 mg | Freq: Once | INTRAMUSCULAR | Status: AC
  Administered 2011-11-23: 3 mg via INTRAMUSCULAR

## 2011-11-23 MED ORDER — ADULT MULTIVITAMIN W/MINERALS CH
1.0000 | ORAL_TABLET | Freq: Every day | ORAL | Status: DC
  Administered 2011-11-23: 1 via ORAL
  Filled 2011-11-23: qty 1

## 2011-11-23 MED ORDER — LORAZEPAM 1 MG PO TABS: 1.0000 mg | ORAL_TABLET | ORAL | Status: DC | PRN

## 2011-11-23 MED ORDER — LORAZEPAM 1 MG PO TABS: 0.0000 mg | ORAL_TABLET | Freq: Two times a day (BID) | ORAL | Status: DC

## 2011-11-23 MED ORDER — LORAZEPAM 1 MG PO TABS
1.0000 mg | ORAL_TABLET | Freq: Once | ORAL | Status: AC
  Administered 2011-11-23: 1 mg via ORAL
  Filled 2011-11-23: qty 1

## 2011-11-23 MED ORDER — ZIPRASIDONE MESYLATE 20 MG IM SOLR
INTRAMUSCULAR | Status: AC
  Filled 2011-11-23: qty 20

## 2011-11-23 MED ORDER — LORAZEPAM 1 MG PO TABS: 1.0000 mg | ORAL_TABLET | Freq: Four times a day (QID) | ORAL | Status: DC | PRN

## 2011-11-23 MED ORDER — FOLIC ACID 1 MG PO TABS
1.0000 mg | ORAL_TABLET | Freq: Every day | ORAL | Status: DC
  Administered 2011-11-23: 1 mg via ORAL
  Filled 2011-11-23: qty 1

## 2011-11-23 MED ORDER — LORAZEPAM 1 MG PO TABS
0.0000 mg | ORAL_TABLET | Freq: Four times a day (QID) | ORAL | Status: DC
  Administered 2011-11-23 (×2): 1 mg via ORAL
  Filled 2011-11-23 (×2): qty 1

## 2011-11-23 MED ORDER — VITAMIN B-1 100 MG PO TABS
100.0000 mg | ORAL_TABLET | Freq: Every day | ORAL | Status: DC
  Administered 2011-11-23: 100 mg via ORAL
  Filled 2011-11-23: qty 1

## 2011-11-23 NOTE — BHH Counselor (Signed)
Pt signed a "No-harm contract" and agreed to call the available numbers if she felt like she was going to hurt herself. Pt felt she could keep herself safe at this time. Pt was given several referrals in the community for psychiatrists, counselors and SA tx.

## 2011-11-23 NOTE — ED Notes (Signed)
Pts jeans, shirt and underwear cut off on arrival to room. Shoes, socks, and belt placed in pt belonging bag and put behind nursing station

## 2011-11-23 NOTE — BH Assessment (Signed)
BHH Assessment Progress Note      Pt presents to [facility] after being assessed several hours ago at [facility], pt presents currently with c/o of substance use and appears heavily intoxicated. Pt reports that she was at [facility] hours ago requesting "help". As noted earlier, pt was assessed by ACT team at [facility] and was agreeable to contract for safety. Pt was also consulted by psychiatrist who recommended pt be dcd in [facility] earlier today. Pt was provided outpatient referrals earlier at [facility], but currently reports that she lost the referral information that she was provided with earlier.  Pt currently reports that she has been abusing benzo's for the past couple of days and reports vague suicidal ideations,reporting no plan,means, or intent. Pt is adamant about requesting  Inpatient treatment at [facility]. Writer consulted with [facility] Thurman Coyer and Dr. Mickel Baas about pt current presentation and Dr. Dan Humphreys recommend that pt is declined at [facility] due to reaching her maximum therapeutic benefit and requested that pt be transferred to [facility] for placement at [facility]. Pt declined treamtment and referrals offered. Writer consulted with pt about the recommendation by MD for treatment and pt became upset and stated that she wanted to leave,writer assessed pt for safety again due to pt reporting vague SI earlier, pt reports that she wants to leave and when questioned about SI,reported that she was no longer suicidal.   Spoke with Dr.Walker again about pt refusing to go to [facility] and wanting to go home. Dr.Walker is agreeable that pt can go home and is requesting that pt be given outpatient referrals as well as contact information for [facility],Pt refused treatment and referrals offered.
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
## 2011-11-23 NOTE — BH Assessment (Addendum)
Assessment Note   [name] is a 31 y.o. female who presents to [facility] voluntarily requesting detox from alcohol and endorsing SI. Pt reports she is unsure of a specific plan to commit suicide but states she has "lots of pills" at her home. She states she has taken 15-20 Klonopin and several Xanax earlier today. In addition she reports attempting suicide 2 days ago. She identifies an argument with her boyfriend as precipitating event. She further states that she has had several recent stressors including finical difficulties.   Pt denies HI and [facility]. Pt endorses SA, stating she drinks various amounts of alcohol daily and abuses various amounts of Klonopin and Xanax. Pt reports she has been non-compliant with psychotropic medications for the past several days and feels like she needs inpatient treatment at this time. Pt has a long history of mental health concerns and has received inpatient treatment numerous times. Pt states she can not contract for safety at this time.           Discussed pt with EDP and PA who recommend inpatient treatment at this time.  Axis I: Mood Disorder NOS and Polysubstance Dependence  Axis II: Cluster B Traits Axis III:  Past Medical History  Diagnosis Date  . Depression   . Bipolar 1 disorder   . Anxiety   . Drug abuse    Axis IV: economic problems, housing problems, other psychosocial or environmental problems and problems with primary support group Axis V: 31-40 impairment in reality testing  Past Medical History:  Past Medical History  Diagnosis Date  . Depression   . Bipolar 1 disorder   . Anxiety   . Drug abuse     History reviewed. No pertinent past surgical history.  Family History: No family history on file.  Social History:  reports that she has been smoking.  She does not have any smokeless tobacco history on file. She reports that she drinks alcohol. She reports that she uses illicit drugs (Marijuana).  Additional Social History:   Alcohol / Drug Use History of alcohol / drug use?: Yes Substance #1 Name of Substance 1: alcohol 1 - Amount (size/oz): varries 1 - Frequency: daily 1 - Duration: on and off for years 1 - Last Use / Amount: 11/22/11  CIWA: CIWA-Ar BP: 122/99 mmHg Pulse Rate: 127  COWS:    Allergies: No Known Allergies  Home Medications:  (Not in a hospital admission)  OB/GYN Status:  Patient's last menstrual period was 11/14/2011.  General Assessment Data Location of Assessment: [facility] Living Arrangements: Non-relatives/Friends Can pt return to current living arrangement?: Yes Admission Status: Voluntary Is patient capable of signing voluntary admission?: Yes Transfer from: Acute Hospital Referral Source: MD  Education Status Is patient currently in school?: No  Risk to self Suicidal Ideation: Yes-Currently Present Suicidal Intent: No Is patient at risk for suicide?: No Suicidal Plan?: No Access to Means: No What has been your use of drugs/alcohol within the last 12 months?: alchol, hx of xanax abuse Previous Attempts/Gestures: Yes How many times?:  (several) Other Self Harm Risks: none Triggers for Past Attempts: None known Intentional Self Injurious Behavior: None Family Suicide History: No Recent stressful life event(s): Other (Comment);Financial Problems (argument with exboyfriend) Persecutory voices/beliefs?: No Depression: Yes Depression Symptoms: Feeling angry/irritable Substance abuse history and/or treatment for substance abuse?: Yes Suicide prevention information given to non-admitted patients: Not applicable  Risk to Others Homicidal Ideation: No Thoughts of Harm to Others: No Current Homicidal Intent: No Current Homicidal  Plan: No Access to Homicidal Means: No Identified Victim: none History of harm to others?: No Assessment of Violence: None Noted Violent Behavior Description: cooperative Does patient have access to weapons?: No Criminal Charges Pending?:  No Does patient have a court date: No  Psychosis Hallucinations: None noted Delusions: None noted  Mental Status Report Appear/Hygiene: Disheveled Eye Contact: Poor Motor Activity: Unremarkable Speech: Logical/coherent Level of Consciousness: Alert Mood: Irritable Affect: Irritable Anxiety Level: None Thought Processes: Coherent;Relevant Judgement: Impaired Orientation: Person;Place;Time;Situation Obsessive Compulsive Thoughts/Behaviors: None  Cognitive Functioning Concentration: Normal Memory: Recent Intact;Remote Intact IQ: Average Insight: Poor Impulse Control: Poor Appetite: Fair Weight Loss: 0  Weight Gain: 0  Sleep: No Change Vegetative Symptoms: None  ADLScreening [facility]) Patient's cognitive ability adequate to safely complete daily activities?: Yes Patient able to express need for assistance with ADLs?: Yes Independently performs ADLs?: Yes (appropriate for developmental age)  Abuse/Neglect [facility]) Physical Abuse: Denies Verbal Abuse: Denies Sexual Abuse: Denies  Prior Inpatient Therapy Prior Inpatient Therapy: Yes Prior Therapy Dates: 09/2011 Prior Therapy Facilty/Provider(s): [facility] and [facility] Reason for Treatment: SI and SA  Prior Outpatient Therapy Prior Outpatient Therapy: Yes Prior Therapy Dates: ongoing Prior Therapy Facilty/Provider(s): [facility] Reason for Treatment: medication management  ADL Screening (condition at time of admission) Patient's cognitive ability adequate to safely complete daily activities?: Yes Patient able to express need for assistance with ADLs?: Yes Independently performs ADLs?: Yes (appropriate for developmental age) Weakness of Legs: None Weakness of Arms/Hands: None  Home Assistive Devices/Equipment Home Assistive Devices/Equipment: None    Abuse/Neglect Assessment (Assessment to be complete while patient is alone) Physical Abuse: Denies Verbal Abuse: Denies Sexual Abuse: Denies Exploitation  of patient/patient's resources: Denies Self-Neglect: Denies Values / Beliefs Cultural Requests During Hospitalization: None Spiritual Requests During Hospitalization: None   Advance Directives (For Healthcare) Advance Directive: Patient does not have advance directive;Patient would not like information Pre-existing out of facility DNR order (yellow form or pink MOST form): No Nutrition Screen Diet: Regular Unintentional weight loss greater than 10lbs within the last month: No Problems chewing or swallowing foods and/or liquids: No Home Tube Feeding or Total Parenteral Nutrition (TPN): No Patient appears severely malnourished: No Pregnant or Lactating: No  Additional Information 1:1 In Past 12 Months?: No CIRT Risk: No Elopement Risk: No Does patient have medical clearance?: Yes     Disposition:  Disposition Disposition of Patient: Referred to;Inpatient treatment program Type of inpatient treatment program: Adult Patient referred to: Other (Comment) Pt has been referred to [facility], [facility], and [facility]. No beds available at [place], [facility], [facility], [facility], and [place].  On Site Evaluation by:   Reviewed with Physician:     Georgina Quint A 11/23/2011 2:30 AM

## 2011-11-23 NOTE — ED Notes (Addendum)
Pt's "no harm contract" was found in the lobby bathroom along w/other papers and w/red socks.

## 2011-11-23 NOTE — ED Provider Notes (Signed)
She continues to endorse suicidal ideation. She is requesting medication for acid reflux and anxiety. Psychiatric placement is pending.  Dione Booze, MD 11/23/11 [phone]

## 2011-11-23 NOTE — ED Notes (Signed)
Pt complains that she feels anxious but also states she wants to leave.  I explained to her that she could have additional anxiety medicine at 1200 today.

## 2011-11-23 NOTE — H&P (Signed)
Psychiatric Admission Assessment Adult  Patient Identification:  [name] Date of Evaluation:  11/23/2011 Chief Complaint:  detox History of Present Illness: pt is a 31 y/o Wf presenting to [facility] ED via POV requesting voluntary commitment due to SI with plan and intent. Pt was recently seen 8/13 due to  suicide attempt i.e. attempted Drug O/D with neurontin. Pt is also seeking detox from alcohol and rx drugs i.e. Xanax and klonopin. Pt has been evaluated and medically cleared per EDP. Pt gives h/o of consuming 15 - 20 xanax and klonopin pills over the past 24 hours. Pt has also drank a bottle of cough syrup, smoked marijuana and consumed 8 - 10 shots of whiskey at 10 pm prior to presenting tonight to the ED.Pt has a h/o of both DT's and blacking out as a result of her alcohol abuse in the past. Pt has also used heroin, cocaine and crystal meth within the last year.  Pt has a long standing h/o of polysubstance abuse  Per her most recent admission on 8/13 for drug o/d and suicide attempt tele-psych recommended out patient NA/AA. Pt has psychiatric hx c/o adjustment d/o, depression and bipolar 1. Pt also has a strong family h/o of mental illness i.e. mother with anxiety and borderline personality d/o and father with PTSD associated with his service in [place]. Pt was last seen as an out patient by mental health provider at [facility] on 8/1 for rx refill. Her Psychotropic meds c/o Seroquel, Buspar, and neurontin. Pt is feeling depressed and rates her sx 8/10. these sx include hopelessness, feeling of worthlessness, decreased concentration, racing thoughts and feeling of isolation. Pt also notes her anxiety level is a 10/10 with periods of mania manifesting as panic attacks including loud and aggressive behaviors. Pt denies any delusional thoughts homicidal ideations or hallucinations at present. Pt states a lot of her depression is due to her socioeconomic situation as she is unemployed, single and without  children.  Mood Symptoms:  Concentration, Depression, Helplessness, Hopelessness, Hypomania/Mania, Mood Swings, SI, Worthlessness, Depression Symptoms:  depressed mood, feelings of worthlessness/guilt, difficulty concentrating, hopelessness, suicidal thoughts with specific plan, suicidal attempt, anxiety, panic attacks, (Hypo) Manic Symptoms:  Elevated Mood, Impulsivity, Anxiety Symptoms:  Panic Symptoms, Psychotic Symptoms:  n/a  PTSD Symptoms: none  Past Psychiatric History: Diagnosis:adjustment d/o, polysubstance abuse, anxiety d/o, bipolar 1, depression  Hospitalizations:multiple admissions at [facility]  Outpatient Care:[facility]  Substance Abuse Care:AA  Self-Mutilation:no  Suicidal Attempts:yes  Violent Behaviors:potential   Past Medical History:   Past Medical History  Diagnosis Date  . Depression   . Bipolar 1 disorder   . Anxiety   . Drug abuse    None. Allergies:  No Known Allergies PTA Medications:  (Not in a hospital admission)  Previous Psychotropic Medications:  Medication/Dose                 Substance Abuse History in the last 12 months: Substance Age of 1st Use Last Use Amount Specific Type  Nicotine      Alcohol      Cannabis      Opiates      Cocaine      Methamphetamines      LSD      Ecstasy      Benzodiazepines      Caffeine      Inhalants      Others:  Consequences of Substance Abuse: Blackouts:   DT's: Withdrawal Symptoms:   Tremors  Social History: Current Place of Residence:   Place of Birth:   Family Members: Marital Status:  Single Children:  Sons:  Daughters: Relationships: Education:  [employer] Problems/Performance: Religious Beliefs/Practices: History of Abuse (Emotional/Phsycial/Sexual) Teacher, music History:  None. Legal History: Hobbies/Interests:  Family History:  No family history on file.  Mental Status  Examination/Evaluation: Objective:  Appearance: Disheveled  Eye Contact::  Minimal  Speech:  Garbled  Volume:  Decreased  Mood:  Depressed, Hopeless and Worthless  Affect:  Flat  Thought Process:  Circumstantial  Orientation:  Full  Thought Content:  WDL  Suicidal Thoughts:  Yes.  with intent/plan  Homicidal Thoughts:  No  Memory:  Immediate;   Fair  Judgement:  Poor  Insight:  Lacking  Psychomotor Activity:  Increased  Concentration:  Fair  Recall:  Fair  Akathisia:  Yes  Handed:  Right  AIMS (if indicated):     Assets:  Desire for Improvement  Sleep:       Laboratory/X-Ray Psychological Evaluation(s)      Assessment:    AXIS I:  Adjustment Disorder with Depressed Mood, Alcohol Abuse, Anxiety Disorder NOS, Bipolar, Manic and Substance Abuse AXIS II:  Borderline Personality Dis. AXIS III:  obesity Past Medical History  Diagnosis Date  . Depression   . Bipolar 1 disorder   . Anxiety   . Drug abuse    AXIS IV:  economic problems and other psychosocial or environmental problems AXIS V:  11-20 some danger of hurting self or others possible OR occasionally fails to maintain minimal personal hygiene OR gross impairment in communication  Treatment Plan/Recommendations: 1) Rec intensive in patient detox program in conjunction with in patient psycho therapy and likely adjustment of psychotropic medications deemed appropriate per psychiatrist due to patient unable to contract for safety. 2) act team notified 3)Acute alcohol and drug detox  Treatment Plan Summary: Daily contact with patient to assess and evaluate symptoms and progress in treatment Medication management Current Medications:  No current facility-administered medications for this encounter.   Current Outpatient Prescriptions  Medication Sig Dispense Refill  . busPIRone (BUSPAR) 10 MG tablet Take 10 mg by mouth 3 (three) times daily.      Marland Kitchen doxylamine, Sleep, (UNISOM) 25 MG tablet Take 25 mg by mouth at  bedtime as needed. Sleep      . gabapentin (NEURONTIN) 300 MG capsule Take 600 mg by mouth 4 (four) times daily. For withdraw from xanax      . ibuprofen (ADVIL,MOTRIN) 800 MG tablet Take 800 mg by mouth every 8 (eight) hours as needed. Pain      . QUEtiapine (SEROQUEL) 200 MG tablet Take 200 mg by mouth 2 (two) times daily.       Facility-Administered Medications Ordered in Other Encounters  Medication Dose Route Frequency Provider Last Rate Last Dose  . DISCONTD: acetaminophen (TYLENOL) tablet 650 mg  650 mg Oral Q4H PRN Lyanne Co, MD      . DISCONTD: busPIRone (BUSPAR) tablet 10 mg  10 mg Oral TID Lyanne Co, MD   10 mg at 11/22/11 0024  . DISCONTD: gabapentin (NEURONTIN) capsule 600 mg  600 mg Oral QID Lyanne Co, MD      . DISCONTD: ibuprofen (ADVIL,MOTRIN) tablet 600 mg  600 mg Oral Q8H PRN Lyanne Co, MD      . DISCONTD: nicotine (NICODERM CQ - dosed in mg/24 hours)  patch 21 mg  21 mg Transdermal Daily Lyanne Co, MD      . DISCONTD: QUEtiapine (SEROQUEL) tablet 200 mg  200 mg Oral BID Lyanne Co, MD   200 mg at 11/22/11 0014  . DISCONTD: sodium chloride 0.9 % bolus 1,000 mL  1,000 mL Intravenous Once Lyanne Co, MD      . DISCONTD: zolpidem (AMBIEN) tablet 5 mg  5 mg Oral QHS PRN Lyanne Co, MD        Observation Level/Precautions:  Detox  Laboratory:  BMP, CBC, UHCG, UDS, and Ethyl level  Psychotherapy:    Medications:    Routine PRN Medications:  Yes  Consultations:    Discharge Concerns:    Other:     Meosha Castanon E 8/15/20133:21 AM

## 2011-11-23 NOTE — ED Provider Notes (Signed)
History     CSN: [id number]  Arrival date & time 11/22/11  2331   First MD Initiated Contact with Patient 11/23/11 0106      Chief Complaint  Patient presents with  . Medical Clearance    (Consider location/radiation/quality/duration/timing/severity/associated sxs/prior treatment) The history is provided by the patient.   patient has a long-standing history of Klonopin alcohol Xanax and marijuana use.  She's requesting detox today.  She has no homicidal or suicidal thoughts.  She's seen the emergency department for same last night at 9 personally discharged her home.  She is back again requesting the same.  She reports she's been drinking alcohol again today.  She did not contact me the phone numbers I gave her for assistance with her polysubstance abuse.  She denies fevers or chills.  She has no other complaints.  Past Medical History  Diagnosis Date  . Depression   . Bipolar 1 disorder   . Anxiety   . Drug abuse     History reviewed. No pertinent past surgical history.  No family history on file.  History  Substance Use Topics  . Smoking status: Current Everyday Smoker  . Smokeless tobacco: Not on file  . Alcohol Use: Yes    OB History    Grav Para Term Preterm Abortions TAB SAB Ect Mult Living                  Review of Systems  All other systems reviewed and are negative.    Allergies  Review of patient's allergies indicates no known allergies.  Home Medications   Current Outpatient Rx  Name Route Sig Dispense Refill  . BUSPIRONE HCL 10 MG PO TABS Oral Take 10 mg by mouth 3 (three) times daily.    Marland Kitchen DOXYLAMINE SUCCINATE (SLEEP) 25 MG PO TABS Oral Take 25 mg by mouth at bedtime as needed. Sleep    . GABAPENTIN 300 MG PO CAPS Oral Take 600 mg by mouth 4 (four) times daily. For withdraw from xanax    . IBUPROFEN 800 MG PO TABS Oral Take 800 mg by mouth every 8 (eight) hours as needed. Pain    . QUETIAPINE FUMARATE 200 MG PO TABS Oral Take 200 mg by mouth 2  (two) times daily.      BP 122/99  Pulse 127  Temp 98.3 F (36.8 C) (Oral)  Resp 16  Ht 5\' 2"  (1.575 m)  Wt 205 lb (92.987 kg)  BMI 37.49 kg/m2  SpO2 98%  LMP 11/14/2011  Physical Exam  Nursing note and vitals reviewed. Constitutional: She is oriented to person, place, and time. She appears well-developed and well-nourished. No distress.  HENT:  Head: Normocephalic and atraumatic.  Eyes: EOM are normal.  Neck: Normal range of motion.  Cardiovascular: Normal rate, regular rhythm and normal heart sounds.   Pulmonary/Chest: Effort normal and breath sounds normal.  Abdominal: Soft. She exhibits no distension. There is no tenderness.  Musculoskeletal: Normal range of motion.  Neurological: She is alert and oriented to person, place, and time.  Skin: Skin is warm and dry.  Psychiatric: She has a normal mood and affect. Judgment normal.    ED Course  Procedures (including critical care time)  Labs Reviewed  COMPREHENSIVE METABOLIC PANEL - Abnormal; Notable for the following:    Total Bilirubin 0.2 (*)     All other components within normal limits  ETHANOL - Abnormal; Notable for the following:    Alcohol, Ethyl (B) 57 (*)  All other components within normal limits  URINE RAPID DRUG SCREEN (HOSP PERFORMED) - Abnormal; Notable for the following:    Tetrahydrocannabinol POSITIVE (*)     All other components within normal limits  CBC WITH DIFFERENTIAL  POCT PREGNANCY, URINE   No results found.   1. Polysubstance abuse       MDM  I think the patient's main problem is more benzodiazepines and marijuana and last alcohol.  The patient will be placed on alcohol withdrawal protocol.  To work on placement.  No homicidal or suicidal thoughts.  The patient is relieved when she chooses.        Lyanne Co, MD 11/23/11 [phone]

## 2011-11-23 NOTE — Consult Note (Signed)
Reason for Consult: Alcohol detox treatment, and possible suicidal thoughts Referring Physician: Dr. Lind Guest is an 31 y.o. female.  HPI: Patient was seen and chart reviewed. This patient was known to this provider from her previous encounters at [facility] long emergency department. She had multiple admissions to the [facility] for detox treatment. Patient came to the [facility] long emergency department voluntarily requesting detox treatment for alcohol and the benzodiazepines and also makes statement of suicidal thoughts without specific plan or intention. Patient was received tele psychiatric evaluation 2 days ago and recommended outpatient psychiatric treatment at [facility] long emergency department. Patient reported she had an argument with her ex-girlfriend regarding using of for drugs and alcohol. Patient told her ex-girlfriend "mind your own business". Patient was taken a ride from colleagues at [employer] for getting voluntary detox treatment. Patient reported she stopped taking her psychotropic medication from the [facility] about few days ago and slip back on Klonopin and Xanax and drinking alcohol and smoking marijuana. Patient urine drug screen was negative for benzodiazepines and positive for marijuana. Patient has a blood alcohol level 57 on arrival. Patient does not have symptoms of intoxication or withdrawal symptoms during this time. Patient denies symptoms of for depression, suicidal ideation, homicidal ideation, and psychosis. Patient requested to be discharged to outpatient psychiatric services and the contract for safety.  Past Medical History  Diagnosis Date  . Depression   . Bipolar 1 disorder   . Anxiety   . Drug abuse     History reviewed. No pertinent past surgical history.  No family history on file.  Social History:  reports that she has been smoking.  She does not have any smokeless tobacco history on file. She reports that she drinks  alcohol. She reports that she uses illicit drugs (Marijuana).  Allergies: No Known Allergies  Medications: I have reviewed the patient's current medications.  Results for orders placed during the hospital encounter of 11/22/11 (from the past 48 hour(s))  CBC WITH DIFFERENTIAL     Status: Normal   Collection Time   11/23/11 12:05 AM      Component Value Range Comment   WBC 10.0  4.0 - 10.5 K/uL    RBC 4.01  3.87 - 5.11 MIL/uL    Hemoglobin 13.5  12.0 - 15.0 g/dL    HCT 16.1  09.6 - 04.5 %    MCV 96.3  78.0 - 100.0 fL    MCH 33.7  26.0 - 34.0 pg    MCHC 35.0  30.0 - 36.0 g/dL    RDW 40.9  81.1 - 91.4 %    Platelets 372  150 - 400 K/uL    Neutrophils Relative 74  43 - 77 %    Neutro Abs 7.4  1.7 - 7.7 K/uL    Lymphocytes Relative 20  12 - 46 %    Lymphs Abs 2.0  0.7 - 4.0 K/uL    Monocytes Relative 5  3 - 12 %    Monocytes Absolute 0.5  0.1 - 1.0 K/uL    Eosinophils Relative 1  0 - 5 %    Eosinophils Absolute 0.1  0.0 - 0.7 K/uL    Basophils Relative 0  0 - 1 %    Basophils Absolute 0.0  0.0 - 0.1 K/uL   COMPREHENSIVE METABOLIC PANEL     Status: Abnormal   Collection Time   11/23/11 12:05 AM      Component Value Range Comment  Sodium 138  135 - 145 mEq/L    Potassium 3.7  3.5 - 5.1 mEq/L    Chloride 105  96 - 112 mEq/L    CO2 21  19 - 32 mEq/L    Glucose, Bld 87  70 - 99 mg/dL    BUN 7  6 - 23 mg/dL    Creatinine, Ser 2.95  0.50 - 1.10 mg/dL    Calcium 9.2  8.4 - 62.1 mg/dL    Total Protein 7.2  6.0 - 8.3 g/dL    Albumin 3.8  3.5 - 5.2 g/dL    AST 16  0 - 37 U/L    ALT 19  0 - 35 U/L    Alkaline Phosphatase 79  39 - 117 U/L    Total Bilirubin 0.2 (*) 0.3 - 1.2 mg/dL    GFR calc non Af Amer >90  >90 mL/min    GFR calc Af Amer >90  >90 mL/min   ETHANOL     Status: Abnormal   Collection Time   11/23/11 12:05 AM      Component Value Range Comment   Alcohol, Ethyl (B) 57 (*) 0 - 11 mg/dL   URINE RAPID DRUG SCREEN (HOSP PERFORMED)     Status: Abnormal   Collection Time    11/23/11 12:13 AM      Component Value Range Comment   Opiates NONE DETECTED  NONE DETECTED    Cocaine NONE DETECTED  NONE DETECTED    Benzodiazepines NONE DETECTED  NONE DETECTED    Amphetamines NONE DETECTED  NONE DETECTED    Tetrahydrocannabinol POSITIVE (*) NONE DETECTED    Barbiturates NONE DETECTED  NONE DETECTED   POCT PREGNANCY, URINE     Status: Normal   Collection Time   11/23/11 12:19 AM      Component Value Range Comment   Preg Test, Ur NEGATIVE  NEGATIVE     No results found.  Positive for excessive alcohol consumption, illegal drug usage and personality disorder traits Blood pressure 152/83, pulse 111, temperature 98.3 F (36.8 C), temperature source Oral, resp. rate 22, height 5\' 2"  (1.575 m), weight 205 lb (92.987 kg), last menstrual period 11/14/2011, SpO2 100.00%.   Assessment/Plan: Polysubstance abuse versus dependence. 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 personality traits.  Recommended outpatient psychiatric services at [facility] and intensive in-home therapy at psychotherapeutic services. Patient also agreed to participate on AA/NA meetings and has a sponsor. No medication changes made during this visit.  Natanel Snavely,JANARDHAHA R. 11/23/2011, 1:46 PM

## 2011-11-24 ENCOUNTER — Encounter (HOSPITAL_COMMUNITY): Payer: Self-pay | Admitting: Emergency Medicine

## 2011-11-24 LAB — RAPID URINE DRUG SCREEN, HOSP PERFORMED
Barbiturates: NOT DETECTED
Benzodiazepines: POSITIVE — AB

## 2011-11-24 LAB — POCT PREGNANCY, URINE: Preg Test, Ur: NEGATIVE

## 2011-11-24 MED ORDER — DOXYLAMINE SUCCINATE (SLEEP) 25 MG PO TABS
25.0000 mg | ORAL_TABLET | Freq: Every evening | ORAL | Status: DC | PRN
  Filled 2011-11-24: qty 1

## 2011-11-24 MED ORDER — BUSPIRONE HCL 10 MG PO TABS
10.0000 mg | ORAL_TABLET | Freq: Three times a day (TID) | ORAL | Status: DC
  Administered 2011-11-24: 10 mg via ORAL
  Filled 2011-11-24: qty 1

## 2011-11-24 MED ORDER — IBUPROFEN 800 MG PO TABS
800.0000 mg | ORAL_TABLET | Freq: Three times a day (TID) | ORAL | Status: DC | PRN
  Administered 2011-11-24: 800 mg via ORAL
  Filled 2011-11-24: qty 1

## 2011-11-24 MED ORDER — DIPHENHYDRAMINE HCL 25 MG PO CAPS: 25.0000 mg | ORAL_CAPSULE | Freq: Every evening | ORAL | Status: DC | PRN

## 2011-11-24 MED ORDER — POTASSIUM CHLORIDE CRYS ER 20 MEQ PO TBCR
40.0000 meq | EXTENDED_RELEASE_TABLET | Freq: Once | ORAL | Status: AC
  Administered 2011-11-24: 40 meq via ORAL
  Filled 2011-11-24: qty 2

## 2011-11-24 MED ORDER — QUETIAPINE FUMARATE 100 MG PO TABS
200.0000 mg | ORAL_TABLET | Freq: Two times a day (BID) | ORAL | Status: DC
  Administered 2011-11-24: 200 mg via ORAL
  Filled 2011-11-24: qty 2

## 2011-11-24 MED ORDER — GABAPENTIN 300 MG PO CAPS
600.0000 mg | ORAL_CAPSULE | Freq: Four times a day (QID) | ORAL | Status: DC
  Filled 2011-11-24 (×4): qty 2

## 2011-11-24 NOTE — ED Provider Notes (Signed)
  Physical Exam  BP 115/74  Pulse 88  Temp 98.4 F (36.9 C) (Oral)  Resp 18  SpO2 96%  LMP 11/14/2011  Physical Exam  ED Course  Procedures  MDM Patient is here for surface abuse and combativeness. He reportedly had some suicidal ideations. Multiple frequent visits recently for the same. Patient has had telepsychconsult which recommended discharge and continuation of her medications. She will be discharged      Juliet Rude. Rubin Payor, MD 11/24/11 [phone]

## 2011-11-24 NOTE — ED Notes (Signed)
Sitter present at bedside. Pt sleeping. Pt is in wrist restraints, ankle restraints, and a waist restraint.

## 2011-11-24 NOTE — ED Provider Notes (Signed)
1:43 AM patient waking up status post Geodon. Stable for placement and site ED. Ibuprofen for headache. Psych: Orders initiated. Potassium given for very mild hypokalemia. Plan psych disposition.  Results for orders placed during the hospital encounter of 11/23/11  COMPREHENSIVE METABOLIC PANEL      Component Value Range   Sodium 139  135 - 145 mEq/L   Potassium 3.3 (*) 3.5 - 5.1 mEq/L   Chloride 103  96 - 112 mEq/L   CO2 20  19 - 32 mEq/L   Glucose, Bld 84  70 - 99 mg/dL   BUN 7  6 - 23 mg/dL   Creatinine, Ser 4.09  0.50 - 1.10 mg/dL   Calcium 9.0  8.4 - 81.1 mg/dL   Total Protein 7.0  6.0 - 8.3 g/dL   Albumin 3.8  3.5 - 5.2 g/dL   AST 13  0 - 37 U/L   ALT 16  0 - 35 U/L   Alkaline Phosphatase 74  39 - 117 U/L   Total Bilirubin 0.2 (*) 0.3 - 1.2 mg/dL   GFR calc non Af Amer >90  >90 mL/min   GFR calc Af Amer >90  >90 mL/min  CBC WITH DIFFERENTIAL      Component Value Range   WBC 8.7  4.0 - 10.5 K/uL   RBC 3.89  3.87 - 5.11 MIL/uL   Hemoglobin 12.9  12.0 - 15.0 g/dL   HCT 91.4  78.2 - 95.6 %   MCV 95.6  78.0 - 100.0 fL   MCH 33.2  26.0 - 34.0 pg   MCHC 34.7  30.0 - 36.0 g/dL   RDW 21.3  08.6 - 57.8 %   Platelets 359  150 - 400 K/uL   Neutrophils Relative 66  43 - 77 %   Neutro Abs 5.7  1.7 - 7.7 K/uL   Lymphocytes Relative 29  12 - 46 %   Lymphs Abs 2.5  0.7 - 4.0 K/uL   Monocytes Relative 4  3 - 12 %   Monocytes Absolute 0.4  0.1 - 1.0 K/uL   Eosinophils Relative 0  0 - 5 %   Eosinophils Absolute 0.0  0.0 - 0.7 K/uL   Basophils Relative 0  0 - 1 %   Basophils Absolute 0.0  0.0 - 0.1 K/uL  ETHANOL      Component Value Range   Alcohol, Ethyl (B) 80 (*) 0 - 11 mg/dL  ACETAMINOPHEN LEVEL      Component Value Range   Acetaminophen (Tylenol), Serum <15.0  10 - 30 ug/mL  SALICYLATE LEVEL      Component Value Range   Salicylate Lvl <2.0 (*) 2.8 - 20.0 mg/dL  POCT PREGNANCY, URINE      Component Value Range   Preg Test, Ur NEGATIVE  NEGATIVE     Sunnie Nielsen,  MD 11/24/11 [phone]

## 2011-11-24 NOTE — ED Notes (Signed)
Pt brought back to Psych ED and given blue scrubs to change. Rn was gathering supplies to remove IV, but pt pulled it out herself. Pt is calm, cooperative, flat and tearful at present time. She is asking not to be sent to [facility]. Presently IVC'd. Given food and drink.

## 2011-11-24 NOTE — ED Notes (Signed)
Sitter no longer with pt. Pt transferred to Psych Ed.

## 2011-11-25 ENCOUNTER — Encounter (HOSPITAL_COMMUNITY): Payer: Self-pay | Admitting: *Deleted

## 2011-11-25 ENCOUNTER — Emergency Department (HOSPITAL_COMMUNITY)
Admission: EM | Admit: 2011-11-25 | Discharge: 2011-11-27 | Disposition: A | Discharge status: Home / Self Care | Payer: Self-pay | Attending: Emergency Medicine | Admitting: Emergency Medicine

## 2011-11-25 DIAGNOSIS — R45851 Suicidal ideations: Secondary | ICD-10-CM | POA: Insufficient documentation

## 2011-11-25 DIAGNOSIS — F17200 Nicotine dependence, unspecified, uncomplicated: Secondary | ICD-10-CM | POA: Insufficient documentation

## 2011-11-25 DIAGNOSIS — X789XXA Intentional self-harm by unspecified sharp object, initial encounter: Secondary | ICD-10-CM | POA: Insufficient documentation

## 2011-11-25 DIAGNOSIS — F411 Generalized anxiety disorder: Secondary | ICD-10-CM | POA: Insufficient documentation

## 2011-11-25 DIAGNOSIS — F319 Bipolar disorder, unspecified: Secondary | ICD-10-CM | POA: Insufficient documentation

## 2011-11-25 DIAGNOSIS — F1010 Alcohol abuse, uncomplicated: Secondary | ICD-10-CM | POA: Insufficient documentation

## 2011-11-25 DIAGNOSIS — S51809A Unspecified open wound of unspecified forearm, initial encounter: Secondary | ICD-10-CM | POA: Insufficient documentation

## 2011-11-25 DIAGNOSIS — Z23 Encounter for immunization: Secondary | ICD-10-CM | POA: Insufficient documentation

## 2011-11-25 LAB — URINALYSIS, ROUTINE W REFLEX MICROSCOPIC
Bilirubin Urine: NEGATIVE
Glucose, UA: NEGATIVE mg/dL
Ketones, ur: NEGATIVE mg/dL
Leukocytes, UA: NEGATIVE
Nitrite: NEGATIVE
Specific Gravity, Urine: 1.01 (ref 1.005–1.030)
pH: 5 (ref 5.0–8.0)

## 2011-11-25 LAB — CBC WITH DIFFERENTIAL/PLATELET
Basophils Absolute: 0 10*3/uL (ref 0.0–0.1)
Eosinophils Relative: 1 % (ref 0–5)
HCT: 37.4 % (ref 36.0–46.0)
Hemoglobin: 12.9 g/dL (ref 12.0–15.0)
Lymphocytes Relative: 32 % (ref 12–46)
Lymphs Abs: 2.7 10*3/uL (ref 0.7–4.0)
MCV: 96.1 fL (ref 78.0–100.0)
Monocytes Absolute: 0.5 10*3/uL (ref 0.1–1.0)
Neutro Abs: 5 10*3/uL (ref 1.7–7.7)
RBC: 3.89 MIL/uL (ref 3.87–5.11)
RDW: 13.2 % (ref 11.5–15.5)
WBC: 8.4 10*3/uL (ref 4.0–10.5)

## 2011-11-25 LAB — RAPID URINE DRUG SCREEN, HOSP PERFORMED
Amphetamines: NOT DETECTED
Barbiturates: NOT DETECTED
Benzodiazepines: POSITIVE — AB
Cocaine: NOT DETECTED

## 2011-11-25 LAB — POCT I-STAT, CHEM 8
BUN: 5 mg/dL — ABNORMAL LOW (ref 6–23)
Calcium, Ion: 1.18 mmol/L (ref 1.12–1.23)
Chloride: 104 mEq/L (ref 96–112)
Glucose, Bld: 98 mg/dL (ref 70–99)
TCO2: 22 mmol/L (ref 0–100)

## 2011-11-25 LAB — SALICYLATE LEVEL: Salicylate Lvl: <2 mg/dL — ABNORMAL LOW (ref 2.8–20.0)

## 2011-11-25 MED ORDER — TETANUS-DIPHTHERIA TOXOIDS TD 5-2 LFU IM INJ
0.5000 mL | INJECTION | Freq: Once | INTRAMUSCULAR | Status: AC
  Administered 2011-11-25: 0.5 mL via INTRAMUSCULAR
  Filled 2011-11-25: qty 0.5

## 2011-11-25 MED ORDER — IBUPROFEN 200 MG PO TABS: 600.0000 mg | ORAL_TABLET | Freq: Three times a day (TID) | ORAL | Status: DC | PRN

## 2011-11-25 MED ORDER — GABAPENTIN 300 MG PO CAPS
600.0000 mg | ORAL_CAPSULE | Freq: Four times a day (QID) | ORAL | Status: DC
  Administered 2011-11-25 – 2011-11-27 (×7): 600 mg via ORAL
  Filled 2011-11-25 (×8): qty 2

## 2011-11-25 MED ORDER — GI COCKTAIL ~~LOC~~
30.0000 mL | Freq: Three times a day (TID) | ORAL | Status: DC | PRN
  Administered 2011-11-26 (×2): 30 mL via ORAL
  Filled 2011-11-25 (×3): qty 30

## 2011-11-25 MED ORDER — ONDANSETRON HCL 8 MG PO TABS: 4.0000 mg | ORAL_TABLET | Freq: Three times a day (TID) | ORAL | Status: DC | PRN

## 2011-11-25 MED ORDER — LORAZEPAM 1 MG PO TABS
2.0000 mg | ORAL_TABLET | Freq: Once | ORAL | Status: AC
  Administered 2011-11-25: 2 mg via ORAL
  Filled 2011-11-25: qty 2

## 2011-11-25 MED ORDER — ZOLPIDEM TARTRATE 5 MG PO TABS
5.0000 mg | ORAL_TABLET | Freq: Every evening | ORAL | Status: DC | PRN
  Administered 2011-11-25: 5 mg via ORAL
  Filled 2011-11-25: qty 1

## 2011-11-25 MED ORDER — BUSPIRONE HCL 10 MG PO TABS
10.0000 mg | ORAL_TABLET | Freq: Three times a day (TID) | ORAL | Status: DC
  Administered 2011-11-25 – 2011-11-27 (×6): 10 mg via ORAL
  Filled 2011-11-25 (×7): qty 1

## 2011-11-25 MED ORDER — QUETIAPINE FUMARATE 200 MG PO TABS
200.0000 mg | ORAL_TABLET | Freq: Two times a day (BID) | ORAL | Status: DC
  Administered 2011-11-25 – 2011-11-27 (×5): 200 mg via ORAL
  Filled 2011-11-25 (×6): qty 1

## 2011-11-25 MED ORDER — NICOTINE 21 MG/24HR TD PT24
21.0000 mg | MEDICATED_PATCH | Freq: Every day | TRANSDERMAL | Status: DC
  Administered 2011-11-25 – 2011-11-26 (×2): 21 mg via TRANSDERMAL
  Filled 2011-11-25 (×3): qty 1

## 2011-11-25 NOTE — ED Notes (Signed)
The pt is intoxicated she will not answer questions.  Just nodes her head

## 2011-11-25 NOTE — BH Assessment (Signed)
Assessment Note   [name] is an 31 y.o. female who initially presented to [facility] with some inebriated with superficial cutting and reporting some suicidal ideation with no plan or intent.  She currently denies SI and states that she drinks daily (unknown amounts, maybe 12 beers and liquor) and takes xanax and klonopin.  She denies any HI or psychosis.  She currently is requesting detox.  Referral made to [facility].  Axis I: Mood Disorder NOS, Substance Abuse and Alcohol dependence Axis II: Deferred Axis III:  Past Medical History  Diagnosis Date  . Depression   . Bipolar 1 disorder   . Anxiety   . Drug abuse    Axis IV: problems related to social environment and problems with primary support group Axis V: 41-50 serious symptoms  Past Medical History:  Past Medical History  Diagnosis Date  . Depression   . Bipolar 1 disorder   . Anxiety   . Drug abuse     History reviewed. No pertinent past surgical history.  Family History: No family history on file.  Social History:  reports that she has been smoking.  She does not have any smokeless tobacco history on file. She reports that she drinks alcohol. She reports that she uses illicit drugs (Marijuana).  Additional Social History:  Substance #1 Name of Substance 1: Alcohol 1 - Age of First Use: 14 1 - Amount (size/oz): varies, maybe a 12 pack 1 - Frequency: daily 1 - Duration: years 1 - Last Use / Amount: 11/24/11 Substance #2 Name of Substance 2: Benzos 2 - Age of First Use: 11 2 - Amount (size/oz): 10-12 2mg  Xanax, 6 white Klonopin 2 - Frequency: daily 2 - Duration: ongoing 2 - Last Use / Amount: 11/24/11  CIWA: CIWA-Ar BP: 109/74 mmHg Nausea and Vomiting: no nausea and no vomiting Tactile Disturbances: none Tremor: not visible, but can be felt fingertip to fingertip Auditory Disturbances: not present Paroxysmal Sweats: no sweat visible Visual Disturbances: not present Anxiety: mildly anxious Headache, Fullness  in Head: none present Agitation: normal activity Orientation and Clouding of Sensorium: oriented and can do serial additions CIWA-Ar Total: 2  COWS:    Allergies: No Known Allergies  Home Medications:  (Not in a hospital admission)  OB/GYN Status:  Patient's last menstrual period was 11/14/2011.  General Assessment Data Location of Assessment: [facility] ED Living Arrangements: Non-relatives/Friends Can pt return to current living arrangement?: Yes Admission Status: Voluntary Is patient capable of signing voluntary admission?: Yes Transfer from: Acute Hospital  Education Status Is patient currently in school?: No  Risk to self Suicidal Ideation: No-Not Currently/Within Last 6 Months Suicidal Intent: No Is patient at risk for suicide?: No Suicidal Plan?: No Access to Means: No What has been your use of drugs/alcohol within the last 12 months?: alcohol, benzos daily Previous Attempts/Gestures: Yes How many times?: 3  Triggers for Past Attempts: None known Intentional Self Injurious Behavior: Cutting Family Suicide History: No Recent stressful life event(s): Financial Problems;Conflict (Comment) Persecutory voices/beliefs?: No Depression: Yes Depression Symptoms: Feeling angry/irritable;Despondent Substance abuse history and/or treatment for substance abuse?: Yes Suicide prevention information given to non-admitted patients: Not applicable  Risk to Others Homicidal Ideation: No Thoughts of Harm to Others: No Current Homicidal Intent: No Current Homicidal Plan: No Access to Homicidal Means: No History of harm to others?: No Assessment of Violence: None Noted Does patient have access to weapons?: No Criminal Charges Pending?: No Does patient have a court date: No  Psychosis Hallucinations: None noted  Delusions: None noted  Mental Status Report Appear/Hygiene: Disheveled Eye Contact: Poor Motor Activity: Freedom of movement Speech: Slurred;Soft Level of  Consciousness: Quiet/awake;Sleeping Mood: Sullen Affect: Appropriate to circumstance Anxiety Level: None Thought Processes: Coherent;Relevant Judgement: Impaired Orientation: Person;Place;Time;Situation Obsessive Compulsive Thoughts/Behaviors: None  Cognitive Functioning Concentration: Normal Memory: Recent Intact;Remote Intact IQ: Average Insight: Poor Impulse Control: Poor Appetite: Fair Sleep: No Change Vegetative Symptoms: None  ADLScreening [facility] Assessment Services) Patient's cognitive ability adequate to safely complete daily activities?: Yes Patient able to express need for assistance with ADLs?: Yes Independently performs ADLs?: Yes (appropriate for developmental age)  Abuse/Neglect [facility]) Physical Abuse: Denies Verbal Abuse: Denies Sexual Abuse: Denies  Prior Inpatient Therapy Prior Inpatient Therapy: Yes Prior Therapy Dates: 09/2011 Prior Therapy Facilty/Provider(s): [facility] and [facility] Reason for Treatment: SI and SA  Prior Outpatient Therapy Prior Outpatient Therapy: Yes Prior Therapy Dates: ongoing Prior Therapy Facilty/Provider(s): [facility] Reason for Treatment: medication management  ADL Screening (condition at time of admission) Patient's cognitive ability adequate to safely complete daily activities?: Yes Patient able to express need for assistance with ADLs?: Yes Independently performs ADLs?: Yes (appropriate for developmental age)       Abuse/Neglect Assessment (Assessment to be complete while patient is alone) Physical Abuse: Denies Verbal Abuse: Denies Sexual Abuse: Denies Exploitation of patient/patient's resources: Denies Self-Neglect: Denies     Merchant navy officer (For Healthcare) Advance Directive: Patient does not have advance directive Pre-existing out of facility DNR order (yellow form or pink MOST form): No Nutrition Screen Diet: Regular  Additional Information 1:1 In Past 12 Months?: No CIRT Risk: No Elopement Risk: No Does  patient have medical clearance?: Yes     Disposition:  Disposition Disposition of Patient: Referred to;Inpatient treatment program Type of inpatient treatment program: Adult Patient referred to: [facility]  On Site Evaluation by:  Manus Rudd Reviewed with Physician:     Steward Ros 11/25/2011 6:27 AM

## 2011-11-25 NOTE — ED Notes (Signed)
Upon pt arrival to CDU pt requested to have her Bipolar medications. PA made aware and new orders noted. Spoke with ACT team member because pt reports that she did not see anyone from ACT team yet. ACT team will come see pt before 7pm. Pt medicated as ordered and current plan of care discussed with pt. Sitter remains at bedside.

## 2011-11-25 NOTE — ED Notes (Signed)
Coffee given Decaf.

## 2011-11-25 NOTE — BH Assessment (Signed)
Assessment Note   [name] is an 31 y.o. female.  Pt is currently on IVC which was initiated by Dr. Norlene Campbell on 08/17.  A telepsych had been ordered prior to this and Dr. Janalyn Rouse w/ telepsych recommended admission to a psychiatric hospital.  This is based on his assessment that patient is not currently completely stable at this time and has been without her medications for a week.  This is her 3rd visit to a [facility] facility in less than a week with the same presentation.  On one of those previous episodes she had made superficial cuts to herself in an effort at self harm.  Pt told this clinician that she does not want to harm herself and wishes to be able to get detox.  This clinician explained to her that she will detox while we work to find an inpatient psychiatric bed.  Clinician informed her that there may be a telepsychiatric consult in the next few days to reassess her status.   Patient has been declined at [facility].  Material sent to [facility] and to [facility]. Previous Note: [name] is an 31 y.o. female who initially presented to [facility] with some inebriated with superficial cutting and reporting some suicidal ideation with no plan or intent. She currently denies SI and states that she drinks daily (unknown amounts, maybe 12 beers and liquor) and takes xanax and klonopin. She denies any HI or psychosis. She currently is requesting detox. Referral made to [facility]  Axis I: Alcohol Abuse and Mood Disorder NOS Axis II: Deferred Axis III:  Past Medical History  Diagnosis Date  . Depression   . Bipolar 1 disorder   . Anxiety   . Drug abuse    Axis IV: economic problems, housing problems, occupational problems, other psychosocial or environmental problems and problems with primary support group Axis V: 31-40 impairment in reality testing  Past Medical History:  Past Medical History  Diagnosis Date  . Depression   . Bipolar 1 disorder   . Anxiety   . Drug abuse      History reviewed. No pertinent past surgical history.  Family History: No family history on file.  Social History:  reports that she has been smoking.  She does not have any smokeless tobacco history on file. She reports that she drinks alcohol. She reports that she uses illicit drugs (Marijuana).  Additional Social History:  Substance #1 Name of Substance 1: Alcohol 1 - Age of First Use: 14 1 - Amount (size/oz): varies, maybe a 12 pack 1 - Frequency: daily 1 - Duration: years 1 - Last Use / Amount: 11/24/11 Substance #2 Name of Substance 2: Benzos 2 - Age of First Use: 11 2 - Amount (size/oz): 10-12 2mg  Xanax, 6 white Klonopin 2 - Frequency: daily 2 - Duration: ongoing 2 - Last Use / Amount: 11/24/11  CIWA: CIWA-Ar BP: 118/77 mmHg Pulse Rate: 92  Nausea and Vomiting: no nausea and no vomiting Tactile Disturbances: none Tremor: not visible, but can be felt fingertip to fingertip Auditory Disturbances: not present Paroxysmal Sweats: no sweat visible Visual Disturbances: not present Anxiety: mildly anxious Headache, Fullness in Head: none present Agitation: normal activity Orientation and Clouding of Sensorium: oriented and can do serial additions CIWA-Ar Total: 2  COWS:    Allergies: No Known Allergies  Home Medications:  (Not in a hospital admission)  OB/GYN Status:  Patient's last menstrual period was 11/14/2011.  General Assessment Data Location of Assessment: [facility] ED ACT  Assessment: Yes Living Arrangements: Non-relatives/Friends Can pt return to current living arrangement?: Yes Admission Status: Involuntary (EDP [place] on 08/17) Is patient capable of signing voluntary admission?: No (Pt currently IVC) Transfer from: Acute Hospital Referral Source: MD  Education Status Is patient currently in school?: No  Risk to self Suicidal Ideation: No-Not Currently/Within Last 6 Months Suicidal Intent: No Is patient at risk for suicide?: Yes (Pt has been cutting on  herself over the last few days) Suicidal Plan?: No Access to Means: No What has been your use of drugs/alcohol within the last 12 months?: ETOH binging Previous Attempts/Gestures: Yes How many times?: 3  Other Self Harm Risks: Cutting Triggers for Past Attempts: None known Intentional Self Injurious Behavior: Cutting Comment - Self Injurious Behavior: Cuts self when inebriated Family Suicide History: No Recent stressful life event(s): Turmoil (Comment);Financial Problems Persecutory voices/beliefs?: No Depression: Yes Depression Symptoms: Insomnia;Despondent;Loss of interest in usual pleasures;Feeling worthless/self pity Substance abuse history and/or treatment for substance abuse?: Yes Suicide prevention information given to non-admitted patients: Not applicable  Risk to Others Homicidal Ideation: No Thoughts of Harm to Others: No Current Homicidal Intent: No Current Homicidal Plan: No Access to Homicidal Means: No Identified Victim: No one History of harm to others?: No Assessment of Violence: None Noted Violent Behavior Description: Cooperative Does patient have access to weapons?: No Criminal Charges Pending?: No Does patient have a court date: No  Psychosis Hallucinations: None noted Delusions: None noted  Mental Status Report Appear/Hygiene: Disheveled Eye Contact: Good Motor Activity: Freedom of movement;Unremarkable Speech: Logical/coherent Level of Consciousness: Quiet/awake Mood: Depressed;Anxious;Sad Affect: Appropriate to circumstance Anxiety Level: None Thought Processes: Coherent;Relevant Judgement: Impaired Orientation: Person;Place;Time;Situation Obsessive Compulsive Thoughts/Behaviors: None  Cognitive Functioning Concentration: Normal Memory: Recent Intact;Remote Intact IQ: Average Insight: Poor Impulse Control: Poor Appetite: Good Weight Loss: 0  Weight Gain: 0  Sleep: No Change Total Hours of Sleep:  (<4H/D) Vegetative Symptoms:  None  ADLScreening [facility] Assessment [facility]) Patient's cognitive ability adequate to safely complete daily activities?: Yes Patient able to express need for assistance with ADLs?: Yes Independently performs ADLs?: Yes (appropriate for developmental age)  Abuse/Neglect [facility]) Physical Abuse: Denies Verbal Abuse: Denies Sexual Abuse: Denies  Prior Inpatient Therapy Prior Inpatient Therapy: Yes Prior Therapy Dates: 09/2011 Prior Therapy Facilty/Provider(s): [facility] and [facility] Reason for Treatment: SI and SA  Prior Outpatient Therapy Prior Outpatient Therapy: Yes Prior Therapy Dates: ongoing Prior Therapy Facilty/Provider(s): [facility] Reason for Treatment: medication management  ADL Screening (condition at time of admission) Patient's cognitive ability adequate to safely complete daily activities?: Yes Patient able to express need for assistance with ADLs?: Yes Independently performs ADLs?: Yes (appropriate for developmental age)       Abuse/Neglect Assessment (Assessment to be complete while patient is alone) Physical Abuse: Denies Verbal Abuse: Denies Sexual Abuse: Denies Exploitation of patient/patient's resources: Denies Self-Neglect: Denies Values / Beliefs Cultural Requests During Hospitalization: None Spiritual Requests During Hospitalization: None   Advance Directives (For Healthcare) Advance Directive: Patient does not have advance directive Pre-existing out of facility DNR order (yellow form or pink MOST form): No Nutrition Screen Diet: Regular  Additional Information 1:1 In Past 12 Months?: No CIRT Risk: No Elopement Risk: No Does patient have medical clearance?: Yes     Disposition:  Disposition Disposition of Patient: Inpatient treatment program;Referred to Type of inpatient treatment program: Adult Patient referred to: Other (Comment) (Referred to [facility])  On Site Evaluation by:   Reviewed with Physician:     Alexandria Lodge  11/25/2011 5:12 PM

## 2011-11-25 NOTE — ED Notes (Signed)
Report to Chris RN.

## 2011-11-25 NOTE — ED Notes (Signed)
Pt moved to A3 to be in front of nurses station since no sitter is available

## 2011-11-25 NOTE — ED Notes (Addendum)
Whitney and Rucker friends at bedside. Leaving to go home. Notified for sitter. Pt. Is currently in the bed sleeping,.

## 2011-11-25 NOTE — ED Notes (Signed)
Patients father called to ask about patient, he was advised that no medical information could be given, he was very uptset. So the patient contacted him via telephone.

## 2011-11-25 NOTE — ED Provider Notes (Signed)
History     CSN: [id number]  Arrival date & time 11/25/11  0114   First MD Initiated Contact with Patient 11/25/11 0153      Chief Complaint  Patient presents with  . intoxicated     (Consider location/radiation/quality/duration/timing/severity/associated sxs/prior treatment) HPI Comments: States she was febrile for approximately 6 weeks. Today, went on a drinking binge. She also took a couple extra doses of her Seroquel because she was thinking about suicide, although she denies any specific suicide plan were attempt to lung cancer as a server has been 6 months since the age of 64. She was recently hospitalized for psychiatric evaluation. She does not remember if she was referred to an outside therapist, but has not made attempt for outside therapy. He is currently in the emergency room with sponsors from [facility]  The history is provided by the patient.    Past Medical History  Diagnosis Date  . Depression   . Bipolar 1 disorder   . Anxiety   . Drug abuse     History reviewed. No pertinent past surgical history.  No family history on file.  History  Substance Use Topics  . Smoking status: Current Everyday Smoker  . Smokeless tobacco: Not on file  . Alcohol Use: Yes    OB History    Grav Para Term Preterm Abortions TAB SAB Ect Mult Living                  Review of Systems  Constitutional: Negative for fever, activity change and appetite change.  HENT: Negative for rhinorrhea.   Gastrointestinal: Negative for nausea and vomiting.  Skin: Positive for wound.  Neurological: Negative for dizziness, weakness and headaches.  Psychiatric/Behavioral: Positive for suicidal ideas and self-injury.    Allergies  Review of patient's allergies indicates no known allergies.  Home Medications   Current Outpatient Rx  Name Route Sig Dispense Refill  . BUSPIRONE HCL 10 MG PO TABS Oral Take 10 mg by mouth 3 (three) times daily.    Marland Kitchen DOXYLAMINE SUCCINATE (SLEEP) 25 MG PO TABS  Oral Take 25 mg by mouth at bedtime as needed. Sleep    . GABAPENTIN 300 MG PO CAPS Oral Take 600 mg by mouth 4 (four) times daily. For withdraw from xanax    . IBUPROFEN 800 MG PO TABS Oral Take 800 mg by mouth every 8 (eight) hours as needed. Pain    . QUETIAPINE FUMARATE 200 MG PO TABS Oral Take 200 mg by mouth 2 (two) times daily.      BP 109/74  Temp 97.9 F (36.6 C) (Oral)  Resp 18  SpO2 95%  LMP 11/14/2011  Physical Exam  Constitutional: She is oriented to person, place, and time. She appears well-developed and well-nourished.  HENT:  Head: Normocephalic.  Neck: Normal range of motion.  Cardiovascular: Normal rate.   Pulmonary/Chest: Effort normal.  Abdominal: Soft.  Musculoskeletal: Normal range of motion.  Neurological: She is alert and oriented to person, place, and time.  Skin: Skin is warm.       5 superficial lacerations to the medial aspect of the left forearm. Patient states he did this several days ago with a penknife    ED Course  Procedures (including critical care time)   Labs Reviewed  ETHANOL  ACETAMINOPHEN LEVEL  SALICYLATE LEVEL  CBC WITH DIFFERENTIAL  URINALYSIS, ROUTINE W REFLEX MICROSCOPIC  URINE RAPID DRUG SCREEN (HOSP PERFORMED)   No results found.   No diagnosis found.  MDM   4:42 AM spoke with ACT team who will assess the patient        Arman Filter, NP 11/25/11 [phone]

## 2011-11-25 NOTE — ED Notes (Signed)
Dinner tray given to patient

## 2011-11-25 NOTE — ED Notes (Addendum)
Currently unable to move pt to POD C due to shortage of staff.  There is 1 RN with 5 pts. Will move pt as soon as possible.

## 2011-11-25 NOTE — ED Provider Notes (Addendum)
1:14 PM Spoke with the psychiatrist on call and they agree that the patient needs admission for his psychiatric illness.  Toy Baker, MD 11/25/11 1317  8:58 AM Pt with stable vitals, more calm today, awaiting placement  Toy Baker, MD 11/26/11 [phone]

## 2011-11-25 NOTE — ED Provider Notes (Signed)
Medical screening examination/treatment/procedure(s) were performed by non-physician practitioner and as supervising physician I was immediately available for consultation/collaboration.  Myron Stankovich M Nation Cradle, MD 11/25/11 0537 

## 2011-11-25 NOTE — ED Notes (Signed)
Pt agitated and states that she is "pissed.  I'm letting you know that I don't want to lose my temper, it wouldn't be good."

## 2011-11-25 NOTE — ED Notes (Signed)
Pt arrived with 2 friends from [place]. Pt is intoxicated. Reports she has hx of depression bipolar and substance abuse since she was 31 yo. States the longest she has been sober since then was 6 months. She has been tx in past for substance abuse last time 2 mo ago. Has been sober for 2 mo up until this week. This week she has drank heavily and taken prescription meds Seroquel and Buspar more than directed. Pt states she has tried to kill herself in the past and has tried 2x this week by overdose. Pt also has several cuts to arms from cutting herself. Friends at bedside. Pt has been wanded by security and belongings taken inventoried and labeled. Placed under Pod A nurse station.

## 2011-11-25 NOTE — ED Notes (Signed)
Pt stated to this rn that she has been in and out of hospital over past 4 days with alcohol/drug abuse issues. Pt states she drank alcohol, smoked marijuana, and took 2-3 seroquel. Denies any other drug use. Pt states "I didn't drink that much I don't think"

## 2011-11-26 MED ORDER — LORAZEPAM 1 MG PO TABS
2.0000 mg | ORAL_TABLET | Freq: Three times a day (TID) | ORAL | Status: DC | PRN
  Administered 2011-11-26 – 2011-11-27 (×3): 2 mg via ORAL
  Filled 2011-11-26 (×2): qty 1
  Filled 2011-11-26 (×2): qty 2

## 2011-11-26 MED ORDER — PANTOPRAZOLE SODIUM 40 MG PO TBEC
40.0000 mg | DELAYED_RELEASE_TABLET | Freq: Once | ORAL | Status: AC
  Administered 2011-11-26: 40 mg via ORAL
  Filled 2011-11-26: qty 1

## 2011-11-26 NOTE — ED Notes (Signed)
Patient used the phone to call her mother.

## 2011-11-26 NOTE — ED Notes (Signed)
ACT team w/ pt at this time.

## 2011-11-26 NOTE — ED Notes (Signed)
Patient complains of indigestion and requested GI Cocktail. Medication given PRN

## 2011-11-26 NOTE — ED Notes (Signed)
caffeine free diet coke provided.

## 2011-11-26 NOTE — ED Notes (Signed)
Resting quietly appears to be sleeping, sitter at bedside

## 2011-11-26 NOTE — ED Notes (Signed)
Patient got really upset after talking on the telephone to someone. She came out of her room crying went into the bathroom cursing and struck the wall. Talked with the patient to deescalate the situation. Notified Dr. Freida Busman. Received and order for Ativan 2 mg po every 8 hours PRN agitation. Sitter remains at the bedside and security not onsite in the unit.

## 2011-11-26 NOTE — ED Notes (Signed)
Patient up to shower for morning care. Patient also advises that the GI cocktail did not work well for her reflux. Dr. Freida Busman notified. New orders for Protonixs obtained

## 2011-11-26 NOTE — ED Notes (Signed)
Report to Kim RN
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
## 2011-11-27 NOTE — ED Notes (Signed)
Per [facility] this morning IVC has been resended and patient is discharged home with outpatient referral information from ACT.

## 2011-11-27 NOTE — ED Provider Notes (Signed)
Patient with the telemedicine psychiatric consult done this morning. They are resending her involuntary commitment patient is no longer suicidal and can go home on her current medications will be given outpatient information for alcohol substance abuse problems. The psychiatrist will resend her IVC paperwork. Patient has been cleared for discharge.  Shelda Jakes, MD 11/27/11 1017

## 2011-11-27 NOTE — ED Notes (Signed)
Sleeping. Sitter at BS. 

## 2011-11-27 NOTE — ED Notes (Signed)
Watching TV. Recently up to b/r. Given coffee per request (decaf). Sitter at [employer].

## 2011-11-27 NOTE — BH Assessment (Signed)
Assessment Note  Update:  Pt received telepsych consult and psychiatrist recommended pt be discharged with outpatient referrals, as pt denies SI/HI or psychosis.  Pt signed a Energy manager and was given outpatient referrals.  Pt's IVC papers rescinded.  ED Zackowski in agreement with disposition.  Updated assessment disposition, completed assessment notification and faxed to [facility] to log.     Disposition:  Disposition Disposition of Patient: Referred to;Outpatient treatment Type of inpatient treatment program: Adult Type of outpatient treatment: Adult Patient referred to: Outpatient clinic referral  On Site Evaluation by:   Reviewed with Physician:  Delorse Limber, Rennis Harding 11/27/2011 10:36 AM

## 2011-11-27 NOTE — H&P (Signed)
Patient was seen and agree with the assessment findings.

## 2012-05-04 ENCOUNTER — Emergency Department (HOSPITAL_COMMUNITY)
Admission: EM | Admit: 2012-05-04 | Discharge: 2012-05-05 | Disposition: A | Discharge status: Home / Self Care | Payer: Self-pay | Attending: Emergency Medicine | Admitting: Emergency Medicine

## 2012-05-04 ENCOUNTER — Encounter (HOSPITAL_COMMUNITY): Payer: Self-pay | Admitting: *Deleted

## 2012-05-04 DIAGNOSIS — R45851 Suicidal ideations: Secondary | ICD-10-CM | POA: Insufficient documentation

## 2012-05-04 DIAGNOSIS — F1010 Alcohol abuse, uncomplicated: Secondary | ICD-10-CM | POA: Insufficient documentation

## 2012-05-04 DIAGNOSIS — F1910 Other psychoactive substance abuse, uncomplicated: Secondary | ICD-10-CM | POA: Insufficient documentation

## 2012-05-04 DIAGNOSIS — F3289 Other specified depressive episodes: Secondary | ICD-10-CM | POA: Insufficient documentation

## 2012-05-04 DIAGNOSIS — R4789 Other speech disturbances: Secondary | ICD-10-CM | POA: Insufficient documentation

## 2012-05-04 DIAGNOSIS — F411 Generalized anxiety disorder: Secondary | ICD-10-CM | POA: Insufficient documentation

## 2012-05-04 DIAGNOSIS — F329 Major depressive disorder, single episode, unspecified: Secondary | ICD-10-CM | POA: Insufficient documentation

## 2012-05-04 DIAGNOSIS — Z79899 Other long term (current) drug therapy: Secondary | ICD-10-CM | POA: Insufficient documentation

## 2012-05-04 DIAGNOSIS — F319 Bipolar disorder, unspecified: Secondary | ICD-10-CM | POA: Insufficient documentation

## 2012-05-04 DIAGNOSIS — Z3202 Encounter for pregnancy test, result negative: Secondary | ICD-10-CM | POA: Insufficient documentation

## 2012-05-04 DIAGNOSIS — F17200 Nicotine dependence, unspecified, uncomplicated: Secondary | ICD-10-CM | POA: Insufficient documentation

## 2012-05-04 LAB — COMPREHENSIVE METABOLIC PANEL
ALT: 34 U/L (ref 0–35)
AST: 23 U/L (ref 0–37)
Albumin: 4.1 g/dL (ref 3.5–5.2)
CO2: 23 mEq/L (ref 19–32)
Calcium: 9 mg/dL (ref 8.4–10.5)
Sodium: 138 mEq/L (ref 135–145)
Total Protein: 7.9 g/dL (ref 6.0–8.3)

## 2012-05-04 LAB — RAPID URINE DRUG SCREEN, HOSP PERFORMED
Amphetamines: NOT DETECTED
Barbiturates: NOT DETECTED
Benzodiazepines: NOT DETECTED
Cocaine: NOT DETECTED
Tetrahydrocannabinol: NOT DETECTED

## 2012-05-04 LAB — CBC
MCH: 33.8 pg (ref 26.0–34.0)
MCHC: 35.2 g/dL (ref 30.0–36.0)
Platelets: 324 10*3/uL (ref 150–400)
RBC: 4.32 MIL/uL (ref 3.87–5.11)

## 2012-05-04 MED ORDER — ADULT MULTIVITAMIN W/MINERALS CH: 1.0000 | ORAL_TABLET | Freq: Every day | ORAL | Status: DC

## 2012-05-04 MED ORDER — LORAZEPAM 1 MG PO TABS: 1.0000 mg | ORAL_TABLET | Freq: Four times a day (QID) | ORAL | Status: DC | PRN

## 2012-05-04 MED ORDER — HALOPERIDOL LACTATE 5 MG/ML IJ SOLN
10.0000 mg | Freq: Once | INTRAMUSCULAR | Status: AC
  Administered 2012-05-04: 10 mg via INTRAMUSCULAR
  Filled 2012-05-04: qty 2

## 2012-05-04 MED ORDER — THIAMINE HCL 100 MG/ML IJ SOLN: 100.0000 mg | Freq: Every day | INTRAMUSCULAR | Status: DC

## 2012-05-04 MED ORDER — LORAZEPAM 2 MG/ML IJ SOLN: 1.0000 mg | Freq: Four times a day (QID) | INTRAMUSCULAR | Status: DC | PRN

## 2012-05-04 MED ORDER — VITAMIN B-1 100 MG PO TABS: 100.0000 mg | ORAL_TABLET | Freq: Every day | ORAL | Status: DC

## 2012-05-04 MED ORDER — FOLIC ACID 1 MG PO TABS: 1.0000 mg | ORAL_TABLET | Freq: Every day | ORAL | Status: DC

## 2012-05-04 NOTE — ED Notes (Signed)
Pt changed into blue scrubs.  Pt belongings placed in bags and behind nurses station.  Pt belongings:  Pair of black gym shoes, pair of white socks, pair of black underwear, pair of blue jeans, one black belt, one brown t-shirt, one white bra, one grey jacket, one tongue ring, two earrings with small balls attached, one ear ring with out the ball, one stud earring with a gem attached, one silver colored ring, one red stretch rubber bracelet.
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
## 2012-05-04 NOTE — ED Notes (Signed)
Pt says that she wants help with her alcoholism. Pt says that she "is suicidal, but I don't plan on acting on it".

## 2012-05-04 NOTE — ED Notes (Signed)
Pt sitting in triage room in wheelchair yelling profanities into the triage area.  Pt refused to stay seated in the wheel chair.  Attempted multiple times to redirect the pt to sit in the wheel chair.  Pt had to be escorted to her chair.  gpd and security called and gpd officer remained in the room.  Pt would yell out for nurses until they came into the room.

## 2012-05-04 NOTE — ED Notes (Signed)
Pt has continued to be obnoxious and cussing at staff, threatening staff,  [employer] at bedside and security at bedside,

## 2012-05-04 NOTE — ED Notes (Signed)
Pt assisted to bathroom with assistance due to unsteadiness after medication,  Pt also given blanket and pillow for comfort measure,

## 2012-05-05 ENCOUNTER — Emergency Department (HOSPITAL_COMMUNITY): Payer: Self-pay

## 2012-05-05 MED ORDER — IBUPROFEN 800 MG PO TABS
800.0000 mg | ORAL_TABLET | Freq: Once | ORAL | Status: AC
  Administered 2012-05-05: 800 mg via ORAL
  Filled 2012-05-05: qty 1

## 2012-05-05 MED ORDER — ACETAMINOPHEN 325 MG PO TABS: 650.0000 mg | ORAL_TABLET | Freq: Once | ORAL | Status: DC

## 2012-05-05 NOTE — ED Notes (Signed)
Pt has two belonging bags in locker # 36.

## 2012-05-05 NOTE — ED Notes (Signed)
Pt taken to front by Yolande Jolly, CNA;  Pt given belongings from locker 36

## 2012-05-05 NOTE — ED Notes (Signed)
Pt denies SI/HI and states she can find Detox assistance herself as an outpatient.

## 2012-05-05 NOTE — ED Notes (Signed)
Pt resting 10 mg of IM  Haldol  given. Sitter at the bedside will monitor.
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
## 2012-05-05 NOTE — ED Notes (Signed)
Discharged instructions explained to patient and pt provided w/ a copy of instructions.  Pt verbalized understanding discharge instructions.
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
## 2012-05-10 ENCOUNTER — Emergency Department (HOSPITAL_COMMUNITY)
Admission: EM | Admit: 2012-05-10 | Discharge: 2012-05-10 | Disposition: A | Discharge status: Home / Self Care | Payer: Self-pay | Attending: Emergency Medicine | Admitting: Emergency Medicine

## 2012-05-10 ENCOUNTER — Encounter (HOSPITAL_COMMUNITY): Payer: Self-pay | Admitting: Emergency Medicine

## 2012-05-10 DIAGNOSIS — F1910 Other psychoactive substance abuse, uncomplicated: Secondary | ICD-10-CM

## 2012-05-10 DIAGNOSIS — Z3202 Encounter for pregnancy test, result negative: Secondary | ICD-10-CM | POA: Insufficient documentation

## 2012-05-10 DIAGNOSIS — F411 Generalized anxiety disorder: Secondary | ICD-10-CM | POA: Insufficient documentation

## 2012-05-10 DIAGNOSIS — F319 Bipolar disorder, unspecified: Secondary | ICD-10-CM | POA: Insufficient documentation

## 2012-05-10 DIAGNOSIS — R45851 Suicidal ideations: Secondary | ICD-10-CM | POA: Insufficient documentation

## 2012-05-10 DIAGNOSIS — Z79899 Other long term (current) drug therapy: Secondary | ICD-10-CM | POA: Insufficient documentation

## 2012-05-10 DIAGNOSIS — F1010 Alcohol abuse, uncomplicated: Secondary | ICD-10-CM | POA: Insufficient documentation

## 2012-05-10 DIAGNOSIS — F17200 Nicotine dependence, unspecified, uncomplicated: Secondary | ICD-10-CM | POA: Insufficient documentation

## 2012-05-10 LAB — CBC
Platelets: 337 10*3/uL (ref 150–400)
RBC: 4.15 MIL/uL (ref 3.87–5.11)
WBC: 6.4 10*3/uL (ref 4.0–10.5)

## 2012-05-10 LAB — RAPID URINE DRUG SCREEN, HOSP PERFORMED
Amphetamines: NOT DETECTED
Barbiturates: NOT DETECTED
Benzodiazepines: POSITIVE — AB

## 2012-05-10 LAB — POCT PREGNANCY, URINE: Preg Test, Ur: NEGATIVE

## 2012-05-10 LAB — COMPREHENSIVE METABOLIC PANEL
ALT: 23 U/L (ref 0–35)
AST: 16 U/L (ref 0–37)
Alkaline Phosphatase: 69 U/L (ref 39–117)
CO2: 21 mEq/L (ref 19–32)
Chloride: 102 mEq/L (ref 96–112)
GFR calc non Af Amer: >90 mL/min (ref >90)
Sodium: 136 mEq/L (ref 135–145)
Total Bilirubin: 0.2 mg/dL — ABNORMAL LOW (ref 0.3–1.2)

## 2012-05-10 MED ORDER — ALUM & MAG HYDROXIDE-SIMETH 200-200-20 MG/5ML PO SUSP: 30.0000 mL | ORAL | Status: DC | PRN

## 2012-05-10 MED ORDER — LORAZEPAM 1 MG PO TABS
1.0000 mg | ORAL_TABLET | Freq: Three times a day (TID) | ORAL | Status: DC | PRN
  Administered 2012-05-10: 1 mg via ORAL
  Filled 2012-05-10: qty 1

## 2012-05-10 MED ORDER — IBUPROFEN 600 MG PO TABS: 600.0000 mg | ORAL_TABLET | Freq: Three times a day (TID) | ORAL | Status: DC | PRN

## 2012-05-10 MED ORDER — ACETAMINOPHEN 325 MG PO TABS: 650.0000 mg | ORAL_TABLET | ORAL | Status: DC | PRN

## 2012-05-10 MED ORDER — ONDANSETRON HCL 4 MG PO TABS: 4.0000 mg | ORAL_TABLET | Freq: Three times a day (TID) | ORAL | Status: DC | PRN

## 2012-05-10 NOTE — ED Notes (Signed)
Pt denies SI/HI. Pt states that she "was just partying", and does not want to hurt herself. Pt also reports that pills were taken over a 12 hour period, not at once. Pt refusing IV at this time. Pt in NAD and A&O

## 2012-05-10 NOTE — Progress Notes (Signed)
Pt without pcp and coverage noted CM spoke with pt who confirms self pay [facility] resident with no pcp.

## 2012-05-10 NOTE — ED Provider Notes (Addendum)
History     CSN: [id number]  Arrival date & time 05/10/12  1319   First MD Initiated Contact with Patient 05/10/12 1350      Chief Complaint  Patient presents with  . Medical Clearance    (Consider location/radiation/quality/duration/timing/severity/associated sxs/prior treatment) The history is provided by the patient and the police. The history is limited by the condition of the patient.   Patient here after drinking copious amounts of alcohol and taking Klonopin tablets. She denies that this was a suicide attempt. According to the police, the patient had a knife at home and she was threatening to cut herself with that. she admits to this but now denies that she is suicidal. Patient is hostile and combative and won't give any further information Past Medical History  Diagnosis Date  . Depression   . Bipolar 1 disorder   . Anxiety   . Drug abuse     History reviewed. No pertinent past surgical history.  No family history on file.  History  Substance Use Topics  . Smoking status: Current Every Day Smoker -- 1.0 packs/day    Types: Cigarettes  . Smokeless tobacco: Not on file  . Alcohol Use: 7.2 oz/week    12 Cans of beer per week     Comment: daily    OB History    Grav Para Term Preterm Abortions TAB SAB Ect Mult Living                  Review of Systems  Unable to perform ROS   Allergies  Review of patient's allergies indicates no known allergies.  Home Medications  No current outpatient prescriptions on file.  BP 113/40  Pulse 114  Temp 98.7 F (37.1 C) (Oral)  Resp 20  SpO2 96%  LMP 04/28/2012  Physical Exam  Nursing note and vitals reviewed. Constitutional: She is oriented to person, place, and time. She appears well-developed and well-nourished.  Non-toxic appearance. No distress.  HENT:  Head: Normocephalic and atraumatic.  Eyes: Conjunctivae normal, EOM and lids are normal. Pupils are equal, round, and reactive to light.  Neck: Normal  range of motion. Neck supple. No tracheal deviation present. No mass present.  Cardiovascular: Normal rate, regular rhythm and normal heart sounds.  Exam reveals no gallop.   No murmur heard. Pulmonary/Chest: Effort normal and breath sounds normal. No stridor. No respiratory distress. She has no decreased breath sounds. She has no wheezes. She has no rhonchi. She has no rales.  Abdominal: Soft. Normal appearance and bowel sounds are normal. She exhibits no distension. There is no tenderness. There is no rebound and no CVA tenderness.  Musculoskeletal: Normal range of motion. She exhibits no edema and no tenderness.  Neurological: She is alert and oriented to person, place, and time. She has normal strength. No cranial nerve deficit or sensory deficit. GCS eye subscore is 4. GCS verbal subscore is 5. GCS motor subscore is 6.  Skin: Skin is warm and dry. No abrasion and no rash noted.  Psychiatric: Her mood appears anxious. Her speech is rapid and/or pressured. She is agitated and aggressive. She expresses suicidal ideation.    ED Course  Procedures (including critical care time)   Labs Reviewed  CBC  COMPREHENSIVE METABOLIC PANEL  ETHANOL  ACETAMINOPHEN LEVEL  SALICYLATE LEVEL  URINE RAPID DRUG SCREEN (HOSP PERFORMED)   No results found.   No diagnosis found.    MDM  Patient has been place under involuntary commitment for safety. Concern that  patient may be suicidal. Have spoken with the behavior health assessment team and they will see the patient. She will be monitored for possible benzodiazepine overdose prior to going to the Psych ed   3:26 PM Care signed out to dr. Quay Burow, MD 05/10/12 1419  Toy Baker, MD 05/10/12 [phone]

## 2012-05-10 NOTE — ED Provider Notes (Addendum)
Patient is awake. Somewhat angry. She appears to medically cleared at this time. To be seen by the ACT team.  [name]. Rubin Payor, MD 05/10/12 2030  Patient's had a telemetry psych consult. She is awake and appropriate and no longer suicidal. The IVC has been reversed and patient's been cleared to go to home.  [name]. Rubin Payor, MD 05/10/12 2320
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
## 2012-05-10 NOTE — ED Notes (Addendum)
Pt presenting to ed with c/o I took 20 klonopin and I'm intoxicated and I was having suicidal thoughts but I'm not having any now. Pt states 20 klonopin are not going to allow me to kill myself it's not enough. Pt states "I was trying to get high". Pt states I'm not changing into those scrubs. Pt states her sponsor called 911. Pt states she also drank a 12 pack of beer and had one bootlegger. Pt is very uncooperative at this time. [employer] is at bedside without IVC paperwork at this time. Pt states "I'm here voluntarily and I'm not staying because I was just trying to get high"

## 2012-05-14 ENCOUNTER — Encounter (HOSPITAL_COMMUNITY): Payer: Self-pay | Admitting: Emergency Medicine

## 2012-05-14 ENCOUNTER — Emergency Department (HOSPITAL_COMMUNITY)
Admission: EM | Admit: 2012-05-14 | Discharge: 2012-05-15 | Disposition: A | Discharge status: Home / Self Care | Payer: Self-pay | Attending: Emergency Medicine | Admitting: Emergency Medicine

## 2012-05-14 DIAGNOSIS — F10929 Alcohol use, unspecified with intoxication, unspecified: Secondary | ICD-10-CM

## 2012-05-14 DIAGNOSIS — F3289 Other specified depressive episodes: Secondary | ICD-10-CM | POA: Insufficient documentation

## 2012-05-14 DIAGNOSIS — F1020 Alcohol dependence, uncomplicated: Secondary | ICD-10-CM | POA: Insufficient documentation

## 2012-05-14 DIAGNOSIS — Z3202 Encounter for pregnancy test, result negative: Secondary | ICD-10-CM | POA: Insufficient documentation

## 2012-05-14 DIAGNOSIS — F1010 Alcohol abuse, uncomplicated: Secondary | ICD-10-CM

## 2012-05-14 DIAGNOSIS — Z79899 Other long term (current) drug therapy: Secondary | ICD-10-CM | POA: Insufficient documentation

## 2012-05-14 DIAGNOSIS — F603 Borderline personality disorder: Secondary | ICD-10-CM | POA: Insufficient documentation

## 2012-05-14 DIAGNOSIS — F17200 Nicotine dependence, unspecified, uncomplicated: Secondary | ICD-10-CM | POA: Insufficient documentation

## 2012-05-14 DIAGNOSIS — F329 Major depressive disorder, single episode, unspecified: Secondary | ICD-10-CM | POA: Insufficient documentation

## 2012-05-14 DIAGNOSIS — F411 Generalized anxiety disorder: Secondary | ICD-10-CM | POA: Insufficient documentation

## 2012-05-14 DIAGNOSIS — R45851 Suicidal ideations: Secondary | ICD-10-CM | POA: Insufficient documentation

## 2012-05-14 DIAGNOSIS — F1310 Sedative, hypnotic or anxiolytic abuse, uncomplicated: Secondary | ICD-10-CM | POA: Insufficient documentation

## 2012-05-14 LAB — COMPREHENSIVE METABOLIC PANEL
Albumin: 3.8 g/dL (ref 3.5–5.2)
Alkaline Phosphatase: 62 U/L (ref 39–117)
BUN: 7 mg/dL (ref 6–23)
Potassium: 3.1 mEq/L — ABNORMAL LOW (ref 3.5–5.1)
Sodium: 141 mEq/L (ref 135–145)
Total Protein: 7 g/dL (ref 6.0–8.3)

## 2012-05-14 LAB — CBC
HCT: 37.4 % (ref 36.0–46.0)
MCHC: 34.8 g/dL (ref 30.0–36.0)
RDW: 13.5 % (ref 11.5–15.5)

## 2012-05-14 LAB — ETHANOL: Alcohol, Ethyl (B): 178 mg/dL — ABNORMAL HIGH (ref 0–11)

## 2012-05-14 MED ORDER — LORAZEPAM 1 MG PO TABS
1.0000 mg | ORAL_TABLET | Freq: Four times a day (QID) | ORAL | Status: DC | PRN
  Filled 2012-05-14: qty 1

## 2012-05-14 MED ORDER — LORAZEPAM 1 MG PO TABS: 0.0000 mg | ORAL_TABLET | Freq: Four times a day (QID) | ORAL | Status: DC

## 2012-05-14 MED ORDER — ADULT MULTIVITAMIN W/MINERALS CH: 1.0000 | ORAL_TABLET | Freq: Every day | ORAL | Status: DC

## 2012-05-14 MED ORDER — FOLIC ACID 1 MG PO TABS: 1.0000 mg | ORAL_TABLET | Freq: Every day | ORAL | Status: DC

## 2012-05-14 MED ORDER — ALUM & MAG HYDROXIDE-SIMETH 200-200-20 MG/5ML PO SUSP: 30.0000 mL | ORAL | Status: DC | PRN

## 2012-05-14 MED ORDER — ONDANSETRON HCL 4 MG PO TABS
4.0000 mg | ORAL_TABLET | Freq: Three times a day (TID) | ORAL | Status: DC | PRN
Start: 2012-05-14 — End: 2012-05-15

## 2012-05-14 MED ORDER — ACETAMINOPHEN 325 MG PO TABS: 650.0000 mg | ORAL_TABLET | ORAL | Status: DC | PRN

## 2012-05-14 MED ORDER — LORAZEPAM 2 MG/ML IJ SOLN: 1.0000 mg | Freq: Four times a day (QID) | INTRAMUSCULAR | Status: DC | PRN

## 2012-05-14 MED ORDER — QUETIAPINE FUMARATE ER 300 MG PO TB24: 300.0000 mg | ORAL_TABLET | Freq: Every evening | ORAL | Status: DC

## 2012-05-14 MED ORDER — LORAZEPAM 1 MG PO TABS: 0.0000 mg | ORAL_TABLET | Freq: Two times a day (BID) | ORAL | Status: DC

## 2012-05-14 MED ORDER — VITAMIN B-1 100 MG PO TABS: 100.0000 mg | ORAL_TABLET | Freq: Every day | ORAL | Status: DC

## 2012-05-14 MED ORDER — IBUPROFEN 600 MG PO TABS: 600.0000 mg | ORAL_TABLET | Freq: Three times a day (TID) | ORAL | Status: DC | PRN

## 2012-05-14 MED ORDER — THIAMINE HCL 100 MG/ML IJ SOLN: 100.0000 mg | Freq: Every day | INTRAMUSCULAR | Status: DC

## 2012-05-14 MED ORDER — LORAZEPAM 1 MG PO TABS
1.0000 mg | ORAL_TABLET | Freq: Once | ORAL | Status: AC
  Administered 2012-05-14: 1 mg via ORAL

## 2012-05-14 MED ORDER — NICOTINE 21 MG/24HR TD PT24: 21.0000 mg | MEDICATED_PATCH | Freq: Every day | TRANSDERMAL | Status: DC

## 2012-05-14 NOTE — ED Notes (Signed)
PA @ bedside.

## 2012-05-14 NOTE — ED Notes (Signed)
Pt alert, arrives from home, requesting detox from alcohol, states "i am suicidal", states "i have stared at bottle of pills", pt + ETOH, resp even unlabored, skin pwd

## 2012-05-14 NOTE — ED Notes (Signed)
Pt provided paper scrubs with instructions 

## 2012-05-14 NOTE — ED Provider Notes (Signed)
History      CSN: [id number]  Arrival date & time 05/14/12  2245   First MD Initiated Contact with Patient 05/14/12 2305      Chief Complaint  Patient presents with  . Drug / Alcohol Assessment  . Suicidal     (Consider location/radiation/quality/duration/timing/severity/associated sxs/prior Treatment) Level 5 caveat applies because pt mentally unstable, anxious, manic, and intoxicated. HPI [name] is a 32 y.o. female who presents to the Emergency Department complaining of drinking copious amounts of alcohol prior to arrival. Pt states she's an alcoholic and cannot stop drinking alcohol. Pt reports suicidal ideations possibly take pills or use a knife. Pt reports anxiety. Pt denies homicidal ideations. Pt states she has an appointment coming up with [facility] for detox, but can not wait until then. Pt states she takes benzodiazapine drugs without prescriptions.  Past Medical History  Diagnosis Date  . Depression   . Bipolar 1 disorder   . Anxiety   . Drug abuse     History reviewed. No pertinent past surgical history.  No family history on file.  History  Substance Use Topics  . Smoking status: Current Every Day Smoker -- 1.0 packs/day    Types: Cigarettes  . Smokeless tobacco: Not on file  . Alcohol Use: 7.2 oz/week    12 Cans of beer per week     Comment: daily    OB History    Grav Para Term Preterm Abortions TAB SAB Ect Mult Living                  Review of Systems  Constitutional: Negative.   HENT: Negative.   Respiratory: Negative.   Cardiovascular: Negative.   Gastrointestinal: Negative.   Musculoskeletal: Negative.   Skin: Negative.   Neurological: Negative.   Hematological: Negative.   Psychiatric/Behavioral: Positive for suicidal ideas. The patient is nervous/anxious.   All other systems reviewed and are negative.    A complete 10 system review of systems was obtained and all systems are negative except as noted in the HPI and PMH.    Allergies  Review of patient's allergies indicates no known allergies.  Home Medications   Current Outpatient Rx  Name  Route  Sig  Dispense  Refill  . QUETIAPINE FUMARATE ER 300 MG PO TB24   Oral   Take 300 mg by mouth every evening.           BP 131/64  Pulse 65  Temp 98.8 F (37.1 C) (Oral)  Resp 18  SpO2 100%  LMP 04/28/2012  Physical Exam  Nursing note and vitals reviewed. Constitutional: She is oriented to person, place, and time. She appears well-developed and well-nourished. No distress.  HENT:  Head: Normocephalic and atraumatic.  Eyes: Conjunctivae normal and EOM are normal.  Neck: Neck supple. No tracheal deviation present.  Cardiovascular: Normal rate.   Pulmonary/Chest: Effort normal. No respiratory distress.  Abdominal: Soft.  Musculoskeletal: Normal range of motion.  Neurological: She is alert and oriented to person, place, and time.  Skin: Skin is warm and dry.  Psychiatric: Her behavior is normal. Her mood appears anxious. She expresses suicidal ideation. She expresses no homicidal ideation. She expresses suicidal plans.       Intoxicated.    ED Course  Procedures (including critical care time)  Medications  QUEtiapine (SEROQUEL XR) 300 MG 24 hr tablet (not administered)  acetaminophen (TYLENOL) tablet 650 mg (not administered)  ibuprofen (ADVIL,MOTRIN) tablet 600 mg (not administered)  nicotine (NICODERM CQ -  dosed in mg/24 hours) patch 21 mg (not administered)  ondansetron (ZOFRAN) tablet 4 mg (not administered)  alum & mag hydroxide-simeth (MAALOX/MYLANTA) 200-200-20 MG/5ML suspension 30 mL (not administered)  LORazepam (ATIVAN) tablet 1 mg (not administered)    Or  LORazepam (ATIVAN) injection 1 mg (not administered)  thiamine (VITAMIN B-1) tablet 100 mg (not administered)    Or  thiamine (B-1) injection 100 mg (not administered)  folic acid (FOLVITE) tablet 1 mg (not administered)  multivitamin with minerals tablet 1 tablet (not  administered)  LORazepam (ATIVAN) tablet 0-4 mg (not administered)    Followed by  LORazepam (ATIVAN) tablet 0-4 mg (not administered)  QUEtiapine (SEROQUEL XR) 24 hr tablet 300 mg (not administered)  ziprasidone (GEODON) injection 20 mg (not administered)  LORazepam (ATIVAN) tablet 1 mg (1 mg Oral Given 05/14/12 2337)    Labs Reviewed  COMPREHENSIVE METABOLIC PANEL - Abnormal; Notable for the following:    Potassium 3.1 (*)     Glucose, Bld 139 (*)     Total Bilirubin 0.1 (*)     All other components within normal limits  ETHANOL - Abnormal; Notable for the following:    Alcohol, Ethyl (B) 178 (*)     All other components within normal limits  CBC  URINE RAPID DRUG SCREEN (HOSP PERFORMED)  PREGNANCY, URINE   No results found.   1. Suicidal ideation   2. Depression   3. Alcohol abuse   4. Alcohol intoxication       MDM    Medical screening examination/treatment/procedure(s) were conducted as a shared visit with non-physician practitioner(s) and myself.  I personally evaluated the patient during the encounter. Patient has been seen by telepsych and felt that she is safe for discharge. She presents of suicide ideation. Patient has history of borderline personality disorder, it has been seen by Dr. Jacky Kindle in the past. Patient discharged with followup information given         Olivia Mackie, MD 05/15/12 [phone]

## 2012-05-15 LAB — RAPID URINE DRUG SCREEN, HOSP PERFORMED
Amphetamines: NOT DETECTED
Benzodiazepines: NOT DETECTED
Cocaine: NOT DETECTED
Opiates: NOT DETECTED

## 2012-05-15 LAB — PREGNANCY, URINE: Preg Test, Ur: NEGATIVE

## 2012-05-15 MED ORDER — ZIPRASIDONE MESYLATE 20 MG IM SOLR: 20.0000 mg | Freq: Once | INTRAMUSCULAR | Status: DC
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
## 2012-05-15 NOTE — ED Notes (Signed)
Tele psych consult completed. Awaiting results to give to Dr Norlene Campbell. ED Doctor.

## 2012-05-15 NOTE — ED Notes (Signed)
[employer] and security outside to help calm patient down. Tele psych consult completed and called in.

## 2012-05-15 NOTE — ED Notes (Signed)
Patient refuse to sign chart and patient ambulatory with a steady gait. Respirations equal and unlabored. Skin warm and dry. Patient verbally abusive to staff as we prepare to discharge to home.

## 2012-09-16 ENCOUNTER — Emergency Department (HOSPITAL_COMMUNITY)
Admission: EM | Admit: 2012-09-16 | Discharge: 2012-09-16 | Discharge status: Against Medical Advice | Payer: Self-pay | Attending: Emergency Medicine | Admitting: Emergency Medicine

## 2012-09-16 ENCOUNTER — Encounter (HOSPITAL_COMMUNITY): Payer: Self-pay | Admitting: Emergency Medicine

## 2012-09-16 ENCOUNTER — Encounter (HOSPITAL_COMMUNITY): Payer: Self-pay | Admitting: Family Medicine

## 2012-09-16 ENCOUNTER — Emergency Department (HOSPITAL_COMMUNITY)
Admission: EM | Admit: 2012-09-16 | Discharge: 2012-09-16 | Disposition: A | Discharge status: Home / Self Care | Payer: Self-pay | Attending: Emergency Medicine | Admitting: Emergency Medicine

## 2012-09-16 ENCOUNTER — Emergency Department (HOSPITAL_COMMUNITY): Payer: Self-pay

## 2012-09-16 DIAGNOSIS — F17200 Nicotine dependence, unspecified, uncomplicated: Secondary | ICD-10-CM | POA: Insufficient documentation

## 2012-09-16 DIAGNOSIS — R000 Tachycardia, unspecified: Secondary | ICD-10-CM | POA: Insufficient documentation

## 2012-09-16 DIAGNOSIS — Z8659 Personal history of other mental and behavioral disorders: Secondary | ICD-10-CM | POA: Insufficient documentation

## 2012-09-16 DIAGNOSIS — S51809A Unspecified open wound of unspecified forearm, initial encounter: Secondary | ICD-10-CM | POA: Insufficient documentation

## 2012-09-16 DIAGNOSIS — Z23 Encounter for immunization: Secondary | ICD-10-CM | POA: Insufficient documentation

## 2012-09-16 DIAGNOSIS — F10229 Alcohol dependence with intoxication, unspecified: Secondary | ICD-10-CM | POA: Insufficient documentation

## 2012-09-16 DIAGNOSIS — S41111A Laceration without foreign body of right upper arm, initial encounter: Secondary | ICD-10-CM

## 2012-09-16 DIAGNOSIS — S51811A Laceration without foreign body of right forearm, initial encounter: Secondary | ICD-10-CM

## 2012-09-16 MED ORDER — SODIUM CHLORIDE 0.9 % IV SOLN: Freq: Once | INTRAVENOUS | Status: DC

## 2012-09-16 MED ORDER — OXYCODONE-ACETAMINOPHEN 5-325 MG PO TABS
1.0000 | ORAL_TABLET | Freq: Once | ORAL | Status: AC
  Administered 2012-09-16: 1 via ORAL
  Filled 2012-09-16: qty 1

## 2012-09-16 MED ORDER — TETANUS-DIPHTHERIA TOXOIDS TD 5-2 LFU IM INJ: 0.5000 mL | INJECTION | Freq: Once | INTRAMUSCULAR | Status: DC

## 2012-09-16 MED ORDER — TETANUS-DIPHTH-ACELL PERTUSSIS 5-2.5-18.5 LF-MCG/0.5 IM SUSP
0.5000 mL | Freq: Once | INTRAMUSCULAR | Status: AC
  Administered 2012-09-16: 0.5 mL via INTRAMUSCULAR
  Filled 2012-09-16: qty 0.5

## 2012-09-16 MED ORDER — CEFAZOLIN SODIUM 1-5 GM-% IV SOLN: 1.0000 g | Freq: Once | INTRAVENOUS | Status: DC

## 2012-09-16 MED ORDER — OXYCODONE-ACETAMINOPHEN 5-325 MG PO TABS: ORAL_TABLET | ORAL | Status: DC

## 2012-09-16 NOTE — ED Notes (Addendum)
PA sutured laceration placed non stick dressing and bacitracin on wound superficial wrapped with kerlex.  Tolerated without incident.
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
## 2012-09-16 NOTE — ED Provider Notes (Signed)
Medical screening examination/treatment/procedure(s) were performed by non-physician practitioner and as supervising physician I was immediately available for consultation/collaboration.  Olivia Mackie, MD 09/16/12 [phone]

## 2012-09-16 NOTE — ED Notes (Signed)
Asked patient if she wants to file a police report. Patient refused.
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
## 2012-09-16 NOTE — ED Notes (Signed)
Pt refuses lab work and IV.  St's she is not gonna get stitches.  Pt getting up off of stretcher st's she is going to bathroom.  When standing up pt fell forward but was caught before hitting the floor.  Instructed pt to get back on stretcher and not to get up again.

## 2012-09-16 NOTE — ED Notes (Addendum)
Patient is now awake and asking what happened. Explained to patient several times her circumstances. Patient does not understand. Pt states she is leaving. Explained to patient importance of seeing PA and getting arm looked at. Asked patient when was her last tetanus and stated "I don't know."  Mild bleeding noted to right arm. PA has been updated and is at bedside.

## 2012-09-16 NOTE — ED Notes (Addendum)
Patient has left against medical advice. Pt took cell phone and clothes that were at bedside.

## 2012-09-16 NOTE — ED Notes (Signed)
Pt on phone-calling for ride 
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
## 2012-09-16 NOTE — ED Notes (Signed)
Pt remains sleeping. Easily awakened. Refuses any tx's at this time.
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
## 2012-09-17 NOTE — ED Provider Notes (Signed)
Medical screening examination/treatment/procedure(s) were performed by non-physician practitioner and as supervising physician I was immediately available for consultation/collaboration.  Caylynn Minchew M Ondria Oswald, MD 09/17/12 0327 

## 2012-09-19 NOTE — ED Provider Notes (Signed)
History/physical exam/procedure(s) were performed by non-physician practitioner and as supervising physician I was immediately available for consultation/collaboration. I have reviewed all notes and am in agreement with care and plan.   Tadeusz Stahl S Jilberto Vanderwall, MD 09/19/12 1757 

## 2012-10-09 ENCOUNTER — Emergency Department (HOSPITAL_COMMUNITY)
Admission: EM | Admit: 2012-10-09 | Discharge: 2012-10-09 | Disposition: A | Discharge status: Home / Self Care | Payer: Self-pay | Attending: Emergency Medicine | Admitting: Emergency Medicine

## 2012-10-09 ENCOUNTER — Encounter (HOSPITAL_COMMUNITY): Payer: Self-pay

## 2012-10-09 ENCOUNTER — Emergency Department (HOSPITAL_COMMUNITY)
Admission: EM | Admit: 2012-10-09 | Discharge: 2012-10-10 | Disposition: A | Discharge status: Home / Self Care | Payer: Self-pay | Attending: Emergency Medicine | Admitting: Emergency Medicine

## 2012-10-09 ENCOUNTER — Encounter (HOSPITAL_COMMUNITY): Payer: Self-pay | Admitting: *Deleted

## 2012-10-09 DIAGNOSIS — F1010 Alcohol abuse, uncomplicated: Secondary | ICD-10-CM | POA: Insufficient documentation

## 2012-10-09 DIAGNOSIS — F1910 Other psychoactive substance abuse, uncomplicated: Secondary | ICD-10-CM

## 2012-10-09 DIAGNOSIS — F319 Bipolar disorder, unspecified: Secondary | ICD-10-CM | POA: Insufficient documentation

## 2012-10-09 DIAGNOSIS — R45851 Suicidal ideations: Secondary | ICD-10-CM | POA: Insufficient documentation

## 2012-10-09 DIAGNOSIS — F329 Major depressive disorder, single episode, unspecified: Secondary | ICD-10-CM | POA: Insufficient documentation

## 2012-10-09 DIAGNOSIS — R000 Tachycardia, unspecified: Secondary | ICD-10-CM | POA: Insufficient documentation

## 2012-10-09 DIAGNOSIS — F29 Unspecified psychosis not due to a substance or known physiological condition: Secondary | ICD-10-CM | POA: Insufficient documentation

## 2012-10-09 DIAGNOSIS — F17200 Nicotine dependence, unspecified, uncomplicated: Secondary | ICD-10-CM | POA: Insufficient documentation

## 2012-10-09 DIAGNOSIS — F1310 Sedative, hypnotic or anxiolytic abuse, uncomplicated: Secondary | ICD-10-CM | POA: Insufficient documentation

## 2012-10-09 DIAGNOSIS — F3289 Other specified depressive episodes: Secondary | ICD-10-CM | POA: Insufficient documentation

## 2012-10-09 DIAGNOSIS — F411 Generalized anxiety disorder: Secondary | ICD-10-CM | POA: Insufficient documentation

## 2012-10-09 DIAGNOSIS — F1110 Opioid abuse, uncomplicated: Secondary | ICD-10-CM | POA: Insufficient documentation

## 2012-10-09 DIAGNOSIS — F39 Unspecified mood [affective] disorder: Secondary | ICD-10-CM | POA: Insufficient documentation

## 2012-10-09 DIAGNOSIS — Z8659 Personal history of other mental and behavioral disorders: Secondary | ICD-10-CM | POA: Insufficient documentation

## 2012-10-09 DIAGNOSIS — Z3202 Encounter for pregnancy test, result negative: Secondary | ICD-10-CM | POA: Insufficient documentation

## 2012-10-09 LAB — COMPREHENSIVE METABOLIC PANEL
ALT: 22 U/L (ref 0–35)
AST: 23 U/L (ref 0–37)
Calcium: 9.7 mg/dL (ref 8.4–10.5)
Creatinine, Ser: 0.61 mg/dL (ref 0.50–1.10)
Sodium: 137 mEq/L (ref 135–145)
Total Protein: 8.4 g/dL — ABNORMAL HIGH (ref 6.0–8.3)

## 2012-10-09 LAB — CBC
MCH: 33.8 pg (ref 26.0–34.0)
MCHC: 34.7 g/dL (ref 30.0–36.0)
MCV: 97.3 fL (ref 78.0–100.0)
Platelets: 387 10*3/uL (ref 150–400)
RDW: 13.2 % (ref 11.5–15.5)

## 2012-10-09 LAB — RAPID URINE DRUG SCREEN, HOSP PERFORMED
Amphetamines: NOT DETECTED
Cocaine: NOT DETECTED
Opiates: NOT DETECTED
Tetrahydrocannabinol: NOT DETECTED

## 2012-10-09 LAB — SALICYLATE LEVEL: Salicylate Lvl: <2 mg/dL — ABNORMAL LOW (ref 2.8–20.0)

## 2012-10-09 NOTE — ED Notes (Signed)
Pt called ride, ride is to be here around 20 minutes, pt given back belongings.

## 2012-10-09 NOTE — ED Notes (Signed)
Per [employer] pts friend called them d/t pt taking pills and drinking all day, pt was also telling friend she wanted to hurt herself, pt was found on front porch intoxicated and high, pt was cussing at police and did not want to come to hospital. Pt ambulatory in triage.

## 2012-10-09 NOTE — ED Provider Notes (Signed)
History    CSN: [id number] Arrival date & time 10/09/12  0219  First MD Initiated Contact with Patient 10/09/12 0239     Chief Complaint  Patient presents with  . Medical Clearance   (Consider location/radiation/quality/duration/timing/severity/associated sxs/prior Treatment) HPI 32 year old female presents to emergency apartment in the company of police with reported polysubstance abuse, depression, and suicidal thoughts.  Patient denies having thoughts of suicide.  She reports her friend was concerned about her and called 911.  She denies making any suicidal threats.  She does report that "my life is shit".  But she denies having a plan in place for suicide.  She reports she has tried going to AA without success.  She reports that she has been drinking and abusing benzos throughout the day. She denies that this was an overdose attempt, only using to get high.  She does not want mental health help at this time  Past Medical History  Diagnosis Date  . Depression   . Bipolar 1 disorder   . Anxiety   . Drug abuse    History reviewed. No pertinent past surgical history. No family history on file. History  Substance Use Topics  . Smoking status: Current Every Day Smoker -- 1.00 packs/day    Types: Cigarettes  . Smokeless tobacco: Not on file  . Alcohol Use: 7.2 oz/week    12 Cans of beer per week     Comment: daily   OB History   Grav Para Term Preterm Abortions TAB SAB Ect Mult Living                 Review of Systems  Unable to perform ROS: Psychiatric disorder    Allergies  Review of patient's allergies indicates no known allergies.  Home Medications   Current Outpatient Rx  Name  Route  Sig  Dispense  Refill  . oxyCODONE-acetaminophen (PERCOCET/ROXICET) 5-325 MG per tablet      Take one or two tablets by mouth every 4 to 6 hours as needed for pain. Do not take with Tylenol as this tablet already contains Tylenol.   5 tablet   0    BP 109/83  Pulse 119   Temp(Src) 98.2 F (36.8 C) (Oral)  Resp 18  SpO2 100%  LMP 09/01/2012 Physical Exam  Nursing note and vitals reviewed. Constitutional: She is oriented to person, place, and time. She appears well-developed and well-nourished.  HENT:  Head: Normocephalic and atraumatic.  Nose: Nose normal.  Mouth/Throat: Oropharynx is clear and moist.  Eyes: Conjunctivae and EOM are normal. Pupils are equal, round, and reactive to light.  Neck: Normal range of motion. Neck supple. No JVD present. No tracheal deviation present. No thyromegaly present.  Cardiovascular: Normal rate, regular rhythm, normal heart sounds and intact distal pulses.  Exam reveals no gallop and no friction rub.   No murmur heard. Pulmonary/Chest: Effort normal and breath sounds normal. No stridor. No respiratory distress. She has no wheezes. She has no rales. She exhibits no tenderness.  Abdominal: Soft. Bowel sounds are normal. She exhibits no distension and no mass. There is no tenderness. There is no rebound and no guarding.  Musculoskeletal: Normal range of motion. She exhibits no edema and no tenderness.  Lymphadenopathy:    She has no cervical adenopathy.  Neurological: She is alert and oriented to person, place, and time. She exhibits normal muscle tone. Coordination normal.  Skin: Skin is dry. No rash noted. No erythema. No pallor.  Psychiatric:  Intoxicated, flat  affect poor insight but no SI/HI.    ED Course  Procedures (including critical care time) Labs Reviewed  COMPREHENSIVE METABOLIC PANEL - Abnormal; Notable for the following:    Total Protein 8.4 (*)    Total Bilirubin 0.2 (*)    All other components within normal limits  ETHANOL - Abnormal; Notable for the following:    Alcohol, Ethyl (B) 118 (*)    All other components within normal limits  SALICYLATE LEVEL - Abnormal; Notable for the following:    Salicylate Lvl <2.0 (*)    All other components within normal limits  ACETAMINOPHEN LEVEL  CBC  URINE  RAPID DRUG SCREEN (HOSP PERFORMED)  POCT PREGNANCY, URINE   No results found. 1. Polysubstance abuse   2. Depression     MDM  32 yo female with substance abuse, depression.  She is adamant she is not suicidal and does not want help at this time.  Will give outpatient resources.  Olivia Mackie, MD 10/09/12 [phone]
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
## 2012-10-09 NOTE — ED Notes (Signed)
Pt discharged home with friend "Geraldine Contras"; denies suicidal ideations; states was just trying to get "high"

## 2012-10-09 NOTE — ED Notes (Signed)
Pt denies SI/HI at this time.  

## 2012-10-10 LAB — COMPREHENSIVE METABOLIC PANEL
ALT: 19 U/L (ref 0–35)
Alkaline Phosphatase: 74 U/L (ref 39–117)
BUN: 7 mg/dL (ref 6–23)
CO2: 20 mEq/L (ref 19–32)
GFR calc Af Amer: >90 mL/min (ref >90)
GFR calc non Af Amer: >90 mL/min (ref >90)
Glucose, Bld: 103 mg/dL — ABNORMAL HIGH (ref 70–99)
Potassium: 3.6 mEq/L (ref 3.5–5.1)
Total Protein: 7.5 g/dL (ref 6.0–8.3)

## 2012-10-10 LAB — RAPID URINE DRUG SCREEN, HOSP PERFORMED
Amphetamines: NOT DETECTED
Cocaine: NOT DETECTED
Opiates: NOT DETECTED
Tetrahydrocannabinol: POSITIVE — AB

## 2012-10-10 LAB — CBC
HCT: 40.1 % (ref 36.0–46.0)
Hemoglobin: 13.8 g/dL (ref 12.0–15.0)
MCH: 33.4 pg (ref 26.0–34.0)
MCHC: 34.4 g/dL (ref 30.0–36.0)

## 2012-10-10 MED ORDER — FOLIC ACID 1 MG PO TABS: 1.0000 mg | ORAL_TABLET | Freq: Every day | ORAL | Status: DC

## 2012-10-10 MED ORDER — ALUM & MAG HYDROXIDE-SIMETH 200-200-20 MG/5ML PO SUSP: 30.0000 mL | ORAL | Status: DC | PRN

## 2012-10-10 MED ORDER — CLONIDINE HCL 0.1 MG PO TABS
0.1000 mg | ORAL_TABLET | ORAL | Status: DC
  Administered 2012-10-10: 0.1 mg via ORAL

## 2012-10-10 MED ORDER — NAPROXEN 500 MG PO TABS: 500.0000 mg | ORAL_TABLET | Freq: Two times a day (BID) | ORAL | Status: DC | PRN

## 2012-10-10 MED ORDER — ONDANSETRON 4 MG PO TBDP
4.0000 mg | ORAL_TABLET | Freq: Four times a day (QID) | ORAL | Status: DC | PRN
  Administered 2012-10-10: 4 mg via ORAL
  Filled 2012-10-10: qty 1

## 2012-10-10 MED ORDER — METHOCARBAMOL 500 MG PO TABS: 500.0000 mg | ORAL_TABLET | Freq: Three times a day (TID) | ORAL | Status: DC | PRN

## 2012-10-10 MED ORDER — VITAMIN B-1 100 MG PO TABS: 100.0000 mg | ORAL_TABLET | Freq: Every day | ORAL | Status: DC

## 2012-10-10 MED ORDER — IBUPROFEN 200 MG PO TABS: 600.0000 mg | ORAL_TABLET | Freq: Three times a day (TID) | ORAL | Status: DC | PRN

## 2012-10-10 MED ORDER — LORAZEPAM 2 MG/ML IJ SOLN: 1.0000 mg | Freq: Four times a day (QID) | INTRAMUSCULAR | Status: DC | PRN

## 2012-10-10 MED ORDER — THIAMINE HCL 100 MG/ML IJ SOLN: 100.0000 mg | Freq: Every day | INTRAMUSCULAR | Status: DC

## 2012-10-10 MED ORDER — CLONIDINE HCL 0.1 MG PO TABS: 0.1000 mg | ORAL_TABLET | Freq: Every day | ORAL | Status: DC

## 2012-10-10 MED ORDER — CLONIDINE HCL 0.1 MG PO TABS
0.1000 mg | ORAL_TABLET | Freq: Four times a day (QID) | ORAL | Status: DC
  Filled 2012-10-10: qty 1

## 2012-10-10 MED ORDER — HYDROXYZINE HCL 25 MG PO TABS
25.0000 mg | ORAL_TABLET | Freq: Four times a day (QID) | ORAL | Status: DC | PRN
  Administered 2012-10-10: 25 mg via ORAL
  Filled 2012-10-10: qty 1

## 2012-10-10 MED ORDER — ZOLPIDEM TARTRATE 5 MG PO TABS
5.0000 mg | ORAL_TABLET | Freq: Every evening | ORAL | Status: DC | PRN
  Administered 2012-10-10: 5 mg via ORAL
  Filled 2012-10-10: qty 1

## 2012-10-10 MED ORDER — NICOTINE 21 MG/24HR TD PT24: 21.0000 mg | MEDICATED_PATCH | Freq: Every day | TRANSDERMAL | Status: DC

## 2012-10-10 MED ORDER — DICYCLOMINE HCL 20 MG PO TABS: 20.0000 mg | ORAL_TABLET | Freq: Four times a day (QID) | ORAL | Status: DC | PRN

## 2012-10-10 MED ORDER — ADULT MULTIVITAMIN W/MINERALS CH: 1.0000 | ORAL_TABLET | Freq: Every day | ORAL | Status: DC

## 2012-10-10 MED ORDER — ONDANSETRON HCL 4 MG PO TABS: 4.0000 mg | ORAL_TABLET | Freq: Three times a day (TID) | ORAL | Status: DC | PRN

## 2012-10-10 MED ORDER — LORAZEPAM 1 MG PO TABS
1.0000 mg | ORAL_TABLET | Freq: Four times a day (QID) | ORAL | Status: DC | PRN
  Administered 2012-10-10: 1 mg via ORAL
  Filled 2012-10-10: qty 1

## 2012-10-10 MED ORDER — LOPERAMIDE HCL 2 MG PO CAPS: 2.0000 mg | ORAL_CAPSULE | ORAL | Status: DC | PRN

## 2012-10-10 NOTE — [facility] Counselor (Signed)
Patient reports that did not want to go to [facility] due to distance. Patient reports that she would prefer to go to [facility], [facility] or [facility] because she felt as if she was able to obtain the most help at those facilities. Writer informed patient that referrals would be made to programs that accept patients that do not have insurance; however, every effort will be made to locate a facility that she felt would benefit her and decrease the likelihood of relapsing again.

## 2012-10-10 NOTE — ED Notes (Signed)
ACT team at bedside speaking with patient.

## 2012-10-10 NOTE — ED Provider Notes (Addendum)
Medical screening examination/treatment/procedure(s) were performed by non-physician practitioner and as supervising physician I was immediately available for consultation/collaboration.  Sunnie Nielsen, MD 10/10/12 0250  5:39 AM PT evlauated by ACT team, she now wants to leave, no longer wants detox, requesting to be discharged. Drug and alcohol program referrals provided.  Results for orders placed during the hospital encounter of 10/09/12  COMPREHENSIVE METABOLIC PANEL      Result Value Range   Sodium 134 (*) 135 - 145 mEq/L   Potassium 3.6  3.5 - 5.1 mEq/L   Chloride 99  96 - 112 mEq/L   CO2 20  19 - 32 mEq/L   Glucose, Bld 103 (*) 70 - 99 mg/dL   BUN 7  6 - 23 mg/dL   Creatinine, Ser 1.61  0.50 - 1.10 mg/dL   Calcium 9.5  8.4 - 09.6 mg/dL   Total Protein 7.5  6.0 - 8.3 g/dL   Albumin 4.0  3.5 - 5.2 g/dL   AST 19  0 - 37 U/L   ALT 19  0 - 35 U/L   Alkaline Phosphatase 74  39 - 117 U/L   Total Bilirubin 0.3  0.3 - 1.2 mg/dL   GFR calc non Af Amer >90  >90 mL/min   GFR calc Af Amer >90  >90 mL/min  URINE RAPID DRUG SCREEN (HOSP PERFORMED)      Result Value Range   Opiates NONE DETECTED  NONE DETECTED   Cocaine NONE DETECTED  NONE DETECTED   Benzodiazepines NONE DETECTED  NONE DETECTED   Amphetamines NONE DETECTED  NONE DETECTED   Tetrahydrocannabinol POSITIVE (*) NONE DETECTED   Barbiturates NONE DETECTED  NONE DETECTED  SALICYLATE LEVEL      Result Value Range   Salicylate Lvl <2.0 (*) 2.8 - 20.0 mg/dL  ACETAMINOPHEN LEVEL      Result Value Range   Acetaminophen (Tylenol), Serum <15.0  10 - 30 ug/mL  CBC      Result Value Range   WBC 7.6  4.0 - 10.5 K/uL   RBC 4.13  3.87 - 5.11 MIL/uL   Hemoglobin 13.8  12.0 - 15.0 g/dL   HCT 04.5  40.9 - 81.1 %   MCV 97.1  78.0 - 100.0 fL   MCH 33.4  26.0 - 34.0 pg   MCHC 34.4  30.0 - 36.0 g/dL   RDW 91.4  78.2 - 95.6 %   Platelets 388  150 - 400 K/uL  POCT PREGNANCY, URINE      Result Value Range   Preg Test, Ur NEGATIVE   NEGATIVE      Sunnie Nielsen, MD 10/10/12 [phone]

## 2012-10-10 NOTE — ED Notes (Signed)
Pt transferred from triage, presents for Detox from Alcohol, Klonopin, Heroin, Adderall, Xanax and Marijuana.  Denies SI or HI.  No AV hallucinations.  Pt reports she has hx of PTSD, Bipolar, Anxiety, Depression and Borderline Personality.  Pt cooperative but anxious.

## 2012-10-10 NOTE — ED Provider Notes (Signed)
History    CSN: [id number] Arrival date & time 10/09/12  2339  First MD Initiated Contact with Patient 10/09/12 2354     Chief Complaint  Patient presents with  . Medical Clearance   (Consider location/radiation/quality/duration/timing/severity/associated sxs/prior Treatment) HPI Comments: 32 y/o female with a PMHx of polysubstance abuse, bipolar disorder, depression and anxiety requesting detox from alcohol, klonopin and heroine. She was seen in the ED 6/1, discharged 6/2 in the morning. Initially brought in on 6/1 due to expressing suicidal ideations to friends via text messages, denies SI or HI at this time. Left the ED 6/2 once she was sober saying she did not need help. She returns today because "her life sucks and all she wants to do is get high". Unsure how much alcohol she drank tonight, took about 10 klonopin and unknown amount of heroine. Went through treatment program at [facility] 4-5 months ago, stayed sober up until 1 month ago. Friends present with her today state patient asked to be brought in. She is here voluntarily. No hx of withdrawal seizures.  The history is provided by the patient and a friend.   Past Medical History  Diagnosis Date  . Depression   . Bipolar 1 disorder   . Anxiety   . Drug abuse    History reviewed. No pertinent past surgical history. History reviewed. No pertinent family history. History  Substance Use Topics  . Smoking status: Current Every Day Smoker -- 1.00 packs/day    Types: Cigarettes  . Smokeless tobacco: Not on file  . Alcohol Use: 7.2 oz/week    12 Cans of beer per week     Comment: daily   OB History   Grav Para Term Preterm Abortions TAB SAB Ect Mult Living                 Review of Systems  Psychiatric/Behavioral: Positive for dysphoric mood. Negative for suicidal ideas and self-injury.  All other systems reviewed and are negative.    Allergies  Review of patient's allergies indicates no known allergies.  Home  Medications   Current Outpatient Rx  Name  Route  Sig  Dispense  Refill  . oxyCODONE-acetaminophen (PERCOCET/ROXICET) 5-325 MG per tablet      Take one or two tablets by mouth every 4 to 6 hours as needed for pain. Do not take with Tylenol as this tablet already contains Tylenol.   5 tablet   0    BP 112/76  Pulse 102  Temp(Src) 98 F (36.7 C) (Oral)  Resp 22  SpO2 96%  LMP 09/01/2012 Physical Exam  Nursing note and vitals reviewed. Constitutional: She is oriented to person, place, and time. She appears well-developed and well-nourished. No distress.  HENT:  Head: Normocephalic and atraumatic.  Mouth/Throat: Oropharynx is clear and moist.  Eyes: Conjunctivae are normal.  Neck: Normal range of motion. Neck supple.  Cardiovascular: Regular rhythm and normal heart sounds.  Tachycardia present.   Pulmonary/Chest: Effort normal and breath sounds normal. No respiratory distress.  Abdominal: Soft. Normal appearance and bowel sounds are normal. She exhibits no distension. There is no tenderness.  Musculoskeletal: Normal range of motion.  Neurological: She is alert and oriented to person, place, and time.  Skin: Skin is warm and dry. She is not diaphoretic.  Psychiatric: Her behavior is normal. Her affect is blunt. Her speech is slurred. She exhibits a depressed mood. She expresses no homicidal and no suicidal ideation.    ED Course  Procedures (including critical  care time) Labs Reviewed  CBC  COMPREHENSIVE METABOLIC PANEL  URINE RAPID DRUG SCREEN (HOSP PERFORMED)  SALICYLATE LEVEL  ACETAMINOPHEN LEVEL   No results found. No diagnosis found.  MDM  Detox from alcohol, klonopin, heroine. Currently intoxicated and high. Here voluntarily. Denies SI/HI. I spoke with ACT team who will evaluate once she begins to sober up. Psych labs ordered, CIWA protocol, psych holding orders.  Trevor Mace, PA-C 10/10/12 [phone]
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
## 2012-11-07 ENCOUNTER — Encounter (HOSPITAL_COMMUNITY): Payer: Self-pay | Admitting: *Deleted

## 2012-11-07 ENCOUNTER — Emergency Department (HOSPITAL_COMMUNITY)
Admission: EM | Admit: 2012-11-07 | Discharge: 2012-11-07 | Discharge status: Against Medical Advice | Payer: Self-pay | Attending: Emergency Medicine | Admitting: Emergency Medicine

## 2012-11-07 ENCOUNTER — Emergency Department (HOSPITAL_COMMUNITY)
Admission: EM | Admit: 2012-11-07 | Discharge: 2012-11-07 | Disposition: A | Discharge status: Home / Self Care | Payer: Self-pay | Attending: Emergency Medicine | Admitting: Emergency Medicine

## 2012-11-07 DIAGNOSIS — Y929 Unspecified place or not applicable: Secondary | ICD-10-CM | POA: Insufficient documentation

## 2012-11-07 DIAGNOSIS — F1310 Sedative, hypnotic or anxiolytic abuse, uncomplicated: Secondary | ICD-10-CM | POA: Insufficient documentation

## 2012-11-07 DIAGNOSIS — F1910 Other psychoactive substance abuse, uncomplicated: Secondary | ICD-10-CM | POA: Insufficient documentation

## 2012-11-07 DIAGNOSIS — Z3202 Encounter for pregnancy test, result negative: Secondary | ICD-10-CM | POA: Insufficient documentation

## 2012-11-07 DIAGNOSIS — F17200 Nicotine dependence, unspecified, uncomplicated: Secondary | ICD-10-CM | POA: Insufficient documentation

## 2012-11-07 DIAGNOSIS — R110 Nausea: Secondary | ICD-10-CM | POA: Insufficient documentation

## 2012-11-07 DIAGNOSIS — Z8659 Personal history of other mental and behavioral disorders: Secondary | ICD-10-CM | POA: Insufficient documentation

## 2012-11-07 DIAGNOSIS — F1010 Alcohol abuse, uncomplicated: Secondary | ICD-10-CM | POA: Insufficient documentation

## 2012-11-07 DIAGNOSIS — F10220 Alcohol dependence with intoxication, uncomplicated: Secondary | ICD-10-CM

## 2012-11-07 DIAGNOSIS — Y939 Activity, unspecified: Secondary | ICD-10-CM | POA: Insufficient documentation

## 2012-11-07 DIAGNOSIS — F1110 Opioid abuse, uncomplicated: Secondary | ICD-10-CM | POA: Insufficient documentation

## 2012-11-07 DIAGNOSIS — IMO0001 Reserved for inherently not codable concepts without codable children: Secondary | ICD-10-CM | POA: Insufficient documentation

## 2012-11-07 DIAGNOSIS — R5381 Other malaise: Secondary | ICD-10-CM | POA: Insufficient documentation

## 2012-11-07 DIAGNOSIS — M2550 Pain in unspecified joint: Secondary | ICD-10-CM | POA: Insufficient documentation

## 2012-11-07 DIAGNOSIS — T424X1A Poisoning by benzodiazepines, accidental (unintentional), initial encounter: Secondary | ICD-10-CM | POA: Insufficient documentation

## 2012-11-07 LAB — URINALYSIS, ROUTINE W REFLEX MICROSCOPIC
Ketones, ur: NEGATIVE mg/dL
Leukocytes, UA: NEGATIVE
Nitrite: NEGATIVE
Protein, ur: NEGATIVE mg/dL
pH: 6 (ref 5.0–8.0)

## 2012-11-07 LAB — COMPREHENSIVE METABOLIC PANEL
ALT: 21 U/L (ref 0–35)
AST: 23 U/L (ref 0–37)
Calcium: 9.2 mg/dL (ref 8.4–10.5)
GFR calc Af Amer: >90 mL/min (ref >90)
Glucose, Bld: 98 mg/dL (ref 70–99)
Sodium: 139 mEq/L (ref 135–145)
Total Protein: 7 g/dL (ref 6.0–8.3)

## 2012-11-07 LAB — CBC WITH DIFFERENTIAL/PLATELET
Basophils Absolute: 0 10*3/uL (ref 0.0–0.1)
Eosinophils Absolute: 0.1 10*3/uL (ref 0.0–0.7)
Eosinophils Relative: 1 % (ref 0–5)
MCH: 34.5 pg — ABNORMAL HIGH (ref 26.0–34.0)
MCHC: 35.7 g/dL (ref 30.0–36.0)
MCV: 96.6 fL (ref 78.0–100.0)
Platelets: 322 10*3/uL (ref 150–400)
RDW: 12.7 % (ref 11.5–15.5)

## 2012-11-07 LAB — RAPID URINE DRUG SCREEN, HOSP PERFORMED
Amphetamines: NOT DETECTED
Cocaine: NOT DETECTED
Opiates: NOT DETECTED

## 2012-11-07 LAB — ACETAMINOPHEN LEVEL: Acetaminophen (Tylenol), Serum: <15 ug/mL (ref 10–30)

## 2012-11-07 MED ORDER — LORAZEPAM 1 MG PO TABS
0.0000 mg | ORAL_TABLET | Freq: Four times a day (QID) | ORAL | Status: DC
  Administered 2012-11-07: 1 mg via ORAL
  Filled 2012-11-07: qty 1

## 2012-11-07 MED ORDER — LORAZEPAM 2 MG/ML IJ SOLN: 1.0000 mg | Freq: Four times a day (QID) | INTRAMUSCULAR | Status: DC | PRN

## 2012-11-07 MED ORDER — THIAMINE HCL 100 MG/ML IJ SOLN: 100.0000 mg | Freq: Every day | INTRAMUSCULAR | Status: DC

## 2012-11-07 MED ORDER — LORAZEPAM 1 MG PO TABS: 0.0000 mg | ORAL_TABLET | Freq: Two times a day (BID) | ORAL | Status: DC

## 2012-11-07 MED ORDER — ADULT MULTIVITAMIN W/MINERALS CH: 1.0000 | ORAL_TABLET | Freq: Every day | ORAL | Status: DC

## 2012-11-07 MED ORDER — FOLIC ACID 1 MG PO TABS: 1.0000 mg | ORAL_TABLET | Freq: Every day | ORAL | Status: DC

## 2012-11-07 MED ORDER — ACTIDOSE WITH SORBITOL 50 GM/240ML PO LIQD
50.0000 g | Freq: Once | ORAL | Status: AC
  Administered 2012-11-07: 50 g via ORAL
  Filled 2012-11-07: qty 240

## 2012-11-07 MED ORDER — ONDANSETRON HCL 4 MG PO TABS
4.0000 mg | ORAL_TABLET | Freq: Three times a day (TID) | ORAL | Status: DC | PRN
  Administered 2012-11-07: 4 mg via ORAL
  Filled 2012-11-07: qty 1

## 2012-11-07 MED ORDER — LORAZEPAM 1 MG PO TABS: 1.0000 mg | ORAL_TABLET | Freq: Four times a day (QID) | ORAL | Status: DC | PRN

## 2012-11-07 MED ORDER — LORAZEPAM 1 MG PO TABS: 0.0000 mg | ORAL_TABLET | Freq: Four times a day (QID) | ORAL | Status: DC

## 2012-11-07 MED ORDER — VITAMIN B-1 100 MG PO TABS: 100.0000 mg | ORAL_TABLET | Freq: Every day | ORAL | Status: DC

## 2012-11-07 MED ORDER — NICOTINE 21 MG/24HR TD PT24
21.0000 mg | MEDICATED_PATCH | Freq: Every day | TRANSDERMAL | Status: DC
  Administered 2012-11-07: 21 mg via TRANSDERMAL
  Filled 2012-11-07: qty 1

## 2012-11-07 NOTE — ED Notes (Signed)
Pt was placed back in bed by ed tech after walking to restroom, tech left and then patient unable to be located on [facility], Security paged, Friends were at the bedside when we were unable to locate pt.

## 2012-11-07 NOTE — ED Notes (Signed)
Dr. Manus Gunning came and spoke with patient.  She said she wanted to leave.  Rancour advised he would get her papers together.   Patient belongings given back to her.

## 2012-11-07 NOTE — ED Notes (Signed)
Pt here for etoh and benzo detox.  Walked out of hospital to smoke and was taken out of system.

## 2012-11-07 NOTE — ED Notes (Signed)
Phlebotomy at bedside.

## 2012-11-07 NOTE — ED Notes (Signed)
The pt is here for detox from multiple drugs xanax klonopin heroine and alcohol.   She took 10 xanax earlier today and  A number of klonopin also.  She is unsteady on her feet slurred speech and requesting water.

## 2012-11-07 NOTE — ED Notes (Addendum)
Pt sts taking heroin taking total of 4 ounces snorted. 3-4 klonopin PO and 14 xanax PO. Pt sts smoking 1-2 blunts today. sts drinking at least a 24 ounce of beer. Pt sts last drink was 30 minutes ago.

## 2012-11-07 NOTE — ED Notes (Signed)
She last ate yesterday
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
## 2012-11-07 NOTE — ED Notes (Signed)
Patient at desk. States that we dont understand that she is having a hard time doing this. States we need to get her orders together and give her some medicine.

## 2012-11-07 NOTE — ED Notes (Signed)
Pt seen by staff walking out to exit. Pt not found by this RN. Discussed with Dr. Preston Fleeting who agrees pt should be discharged as eloped.

## 2012-11-07 NOTE — ED Provider Notes (Signed)
CSN: [id number]     Arrival date & time 11/07/12  0223 History     First MD Initiated Contact with Patient 11/07/12 0231     Chief Complaint  Patient presents with  . Drug Overdose   (Consider location/radiation/quality/duration/timing/severity/associated sxs/prior Treatment) Patient is a 32 y.o. female presenting with Overdose. The history is provided by the patient.  Drug Overdose  She states that she is here to be referred for detox from alcohol, benzodiazepines, and narcotics. She says that she has been drinking a lot and taking a lot of alprazolam, clonazepam, and using heroin. She states that just before she came to the ED, she used some heroin and took about 10 Xanax. She denies suicidal intent although she has been depressed. She denies crying spells and early morning awakening. She denies hallucinations. She is unable to quantitate the amount that she drank today and she's not sure how many pills he is taking over the course of the day. Heroin use has been both intravenous and intranasal. She's also been smoking marijuana. She denies other drug use.  Past Medical History  Diagnosis Date  . Depression   . Bipolar 1 disorder   . Anxiety   . Drug abuse    No past surgical history on file. No family history on file. History  Substance Use Topics  . Smoking status: Current Every Day Smoker -- 1.00 packs/day    Types: Cigarettes  . Smokeless tobacco: Not on file  . Alcohol Use: 7.2 oz/week    12 Cans of beer per week     Comment: daily   OB History   Grav Para Term Preterm Abortions TAB SAB Ect Mult Living                 Review of Systems  All other systems reviewed and are negative.    Allergies  Review of patient's allergies indicates no known allergies.  Home Medications   Current Outpatient Rx  Name  Route  Sig  Dispense  Refill  . oxyCODONE-acetaminophen (PERCOCET/ROXICET) 5-325 MG per tablet      Take one or two tablets by mouth every 4 to 6 hours as  needed for pain. Do not take with Tylenol as this tablet already contains Tylenol.   5 tablet   0    BP 110/70  Pulse 92  Temp(Src) 98.7 F (37.1 C) (Oral)  Resp 18  SpO2 98%  LMP 11/05/2012 Physical Exam  Nursing note and vitals reviewed.  32 year old female, resting comfortably and in no acute distress. Vital signs are normal. Oxygen saturation is 98%, which is normal. Head is normocephalic and atraumatic. PERRLA, EOMI. Oropharynx is clear. Neck is nontender and supple without adenopathy or JVD. Back is nontender and there is no CVA tenderness. Lungs are clear without rales, wheezes, or rhonchi. Chest is nontender. Heart has regular rate and rhythm without murmur. Abdomen is soft, flat, nontender without masses or hepatosplenomegaly and peristalsis is normoactive. Extremities have no cyanosis or edema, full range of motion is present. Skin is warm and dry without rash. Neurologic: She is clinically intoxicated with slightly slurred speech, cranial nerves are intact, there are no motor or sensory deficits. Psychiatric: She does not appear overtly depressed.  ED Course   Procedures (including critical care time)  Results for orders placed during the hospital encounter of 11/07/12  CBC WITH DIFFERENTIAL      Result Value Range   WBC 6.4  4.0 - 10.5 K/uL  RBC 3.80 (*) 3.87 - 5.11 MIL/uL   Hemoglobin 13.1  12.0 - 15.0 g/dL   HCT 16.1  09.6 - 04.5 %   MCV 96.6  78.0 - 100.0 fL   MCH 34.5 (*) 26.0 - 34.0 pg   MCHC 35.7  30.0 - 36.0 g/dL   RDW 40.9  81.1 - 91.4 %   Platelets 322  150 - 400 K/uL   Neutrophils Relative % 55  43 - 77 %   Neutro Abs 3.5  1.7 - 7.7 K/uL   Lymphocytes Relative 38  12 - 46 %   Lymphs Abs 2.4  0.7 - 4.0 K/uL   Monocytes Relative 6  3 - 12 %   Monocytes Absolute 0.4  0.1 - 1.0 K/uL   Eosinophils Relative 1  0 - 5 %   Eosinophils Absolute 0.1  0.0 - 0.7 K/uL   Basophils Relative 0  0 - 1 %   Basophils Absolute 0.0  0.0 - 0.1 K/uL  COMPREHENSIVE  METABOLIC PANEL      Result Value Range   Sodium 139  135 - 145 mEq/L   Potassium 3.5  3.5 - 5.1 mEq/L   Chloride 107  96 - 112 mEq/L   CO2 21  19 - 32 mEq/L   Glucose, Bld 98  70 - 99 mg/dL   BUN 10  6 - 23 mg/dL   Creatinine, Ser 7.82  0.50 - 1.10 mg/dL   Calcium 9.2  8.4 - 95.6 mg/dL   Total Protein 7.0  6.0 - 8.3 g/dL   Albumin 3.9  3.5 - 5.2 g/dL   AST 23  0 - 37 U/L   ALT 21  0 - 35 U/L   Alkaline Phosphatase 70  39 - 117 U/L   Total Bilirubin 0.2 (*) 0.3 - 1.2 mg/dL   GFR calc non Af Amer >90  >90 mL/min   GFR calc Af Amer >90  >90 mL/min  ETHANOL      Result Value Range   Alcohol, Ethyl (B) 102 (*) 0 - 11 mg/dL  SALICYLATE LEVEL      Result Value Range   Salicylate Lvl <2.0 (*) 2.8 - 20.0 mg/dL  ACETAMINOPHEN LEVEL      Result Value Range   Acetaminophen (Tylenol), Serum <15.0  10 - 30 ug/mL  URINALYSIS, ROUTINE W REFLEX MICROSCOPIC      Result Value Range   Color, Urine YELLOW  YELLOW   APPearance CLEAR  CLEAR   Specific Gravity, Urine 1.004 (*) 1.005 - 1.030   pH 6.0  5.0 - 8.0   Glucose, UA NEGATIVE  NEGATIVE mg/dL   Hgb urine dipstick NEGATIVE  NEGATIVE   Bilirubin Urine NEGATIVE  NEGATIVE   Ketones, ur NEGATIVE  NEGATIVE mg/dL   Protein, ur NEGATIVE  NEGATIVE mg/dL   Urobilinogen, UA 0.2  0.0 - 1.0 mg/dL   Nitrite NEGATIVE  NEGATIVE   Leukocytes, UA NEGATIVE  NEGATIVE  URINE RAPID DRUG SCREEN (HOSP PERFORMED)      Result Value Range   Opiates NONE DETECTED  NONE DETECTED   Cocaine NONE DETECTED  NONE DETECTED   Benzodiazepines NONE DETECTED  NONE DETECTED   Amphetamines NONE DETECTED  NONE DETECTED   Tetrahydrocannabinol POSITIVE (*) NONE DETECTED   Barbiturates NONE DETECTED  NONE DETECTED  POCT PREGNANCY, URINE      Result Value Range   Preg Test, Ur NEGATIVE  NEGATIVE     1. Benzodiazepine abuse   2. 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 Heroin abuse     CRITICAL CARE Performed by: Dione Booze Total critical care  time: 60 minutes Critical care time was exclusive of separately billable procedures and treating other patients. Critical care was necessary to treat or prevent imminent or life-threatening deterioration. Critical care was time spent personally by me on the following activities: development of treatment plan with patient and/or surrogate as well as nursing, discussions with consultants, evaluation of patient's response to treatment, examination of patient, obtaining history from patient or surrogate, ordering and performing treatments and interventions, ordering and review of laboratory studies, ordering and review of radiographic studies, pulse oximetry and re-evaluation of patient's condition.  MDM  Polydrug abuse including marijuana, ethanol, heroin, and benzodiazepines. Because of large amount that she took just before coming to the ED, she will be given activated charcoal and IV started appears to be kept on a cardiac monitor. Screening labs will be obtained. Old records were reviewed and she has prior ED visits for drug abuse.  Ethanol level is only 100 so most of her somnolence needs to be prescribed to her benzodiazepines. Drug screen is curiously negative for benzodiazepines and opiates in spite of the history of taking alprazolam and clonazepam as well as heroine. I went in to reevaluate the patient and she was still very somnolent, so consultation to try and arrange for detox was delayed until she was more coherent.  At all times in the emergency department, she has been protecting her airway well and maintaining adequate oxygen saturations on room air. She'll need to be observed for several more hours before consultation can be obtained with ACT Team.  Dione Booze, MD 11/07/12 [phone]

## 2012-11-07 NOTE — ED Notes (Signed)
Pt states she last used ETOH/ benzos/ heroine at 0130 am.  Placed in blue scrubs.  Friend sitting at bedside.  States nausea and thirst.  Breakfast ordered.

## 2012-11-07 NOTE — ED Provider Notes (Signed)
CSN: [id number]     Arrival date & time 11/07/12  6213 History     First MD Initiated Contact with Patient 11/07/12 0815     No chief complaint on file.  (Consider location/radiation/quality/duration/timing/severity/associated sxs/prior Treatment) HPI Comments: Patient checked back in about 10 minutes after eloping. She was seen last night for alcohol, benzodiazepine narcotic abuse. She states she is withdrawing and wants detox. She denies any SI or HI. She is unable to help much alcohol to drink last night, until she took over the day. She's been using heroin and marijuana as well. She denies any prescribed medications  The history is provided by the patient.    Past Medical History  Diagnosis Date  . Depression   . Bipolar 1 disorder   . Anxiety   . Drug abuse    No past surgical history on file. No family history on file. History  Substance Use Topics  . Smoking status: Current Every Day Smoker -- 1.00 packs/day    Types: Cigarettes  . Smokeless tobacco: Not on file  . Alcohol Use: 7.2 oz/week    12 Cans of beer per week     Comment: daily   OB History   Grav Para Term Preterm Abortions TAB SAB Ect Mult Living                 Review of Systems  Constitutional: Negative for fever.  HENT: Negative for congestion and rhinorrhea.   Respiratory: Negative for chest tightness and shortness of breath.   Gastrointestinal: Positive for nausea. Negative for vomiting and abdominal pain.  Genitourinary: Negative for vaginal bleeding and vaginal discharge.  Musculoskeletal: Positive for myalgias and arthralgias.  Skin: Negative for rash.  Neurological: Positive for weakness. Negative for dizziness and headaches.  A complete 10 system review of systems was obtained and all systems are negative except as noted in the HPI and PMH.    Allergies  Review of patient's allergies indicates no known allergies.  Home Medications  No current outpatient prescriptions on file. BP 108/76   Pulse 94  Temp(Src) 98 F (36.7 C) (Oral)  Resp 18  SpO2 98%  LMP 11/05/2012 Physical Exam  Constitutional: She is oriented to person, place, and time. She appears well-developed and well-nourished. No distress.  HENT:  Head: Normocephalic and atraumatic.  Mouth/Throat: Oropharynx is clear and moist. No oropharyngeal exudate.  Eyes: Conjunctivae and EOM are normal. Pupils are equal, round, and reactive to light.  Dilated pupils  Neck: Normal range of motion. Neck supple.  Cardiovascular: Normal rate, regular rhythm and normal heart sounds.   No murmur heard. Pulmonary/Chest: Effort normal and breath sounds normal. No respiratory distress.  Abdominal: Soft. There is no tenderness. There is no rebound and no guarding.  Musculoskeletal: Normal range of motion. She exhibits no edema and no tenderness.  Neurological: She is alert and oriented to person, place, and time. No cranial nerve deficit. She exhibits normal muscle tone. Coordination normal.  Skin: Skin is warm.    ED Course   Procedures (including critical care time)  Labs Reviewed - No data to display No results found. No diagnosis found.  MDM  poly substance abuse requesting detox from alcohol and benzodiazepines. Denies any suicidal ideation or homicidal ideation.  Labs reviewed from previous visits are unremarkable. Discuss with act team who is going to see patient. Patient however decided she no longer wanted to wait and wanted to leave.  She is capable of making her own medical decisions  and will leave against medical advice. She is Not suicidal or homicidal.  Glynn Octave, MD 11/07/12 1133

## 2012-11-07 NOTE — ED Notes (Signed)
Spoke with Baxter Hire, ACT team.  Advised her that patient wants to leave.    Kristen called Dr. Manus Gunning and he advised that he is on the way to talk with patient.

## 2012-11-07 NOTE — ED Notes (Signed)
Dr. Preston Fleeting made aware of CIWA and COWS scores. sts do not give ativan.

## 2012-11-09 ENCOUNTER — Encounter (HOSPITAL_COMMUNITY): Payer: Self-pay | Admitting: *Deleted

## 2012-11-09 ENCOUNTER — Emergency Department (HOSPITAL_COMMUNITY)
Admission: EM | Admit: 2012-11-09 | Discharge: 2012-11-09 | Discharge status: Against Medical Advice | Payer: Self-pay | Attending: Emergency Medicine | Admitting: Emergency Medicine

## 2012-11-09 DIAGNOSIS — R7402 Elevation of levels of lactic acid dehydrogenase (LDH): Secondary | ICD-10-CM | POA: Insufficient documentation

## 2012-11-09 DIAGNOSIS — R7401 Elevation of levels of liver transaminase levels: Secondary | ICD-10-CM | POA: Insufficient documentation

## 2012-11-09 DIAGNOSIS — F1310 Sedative, hypnotic or anxiolytic abuse, uncomplicated: Secondary | ICD-10-CM

## 2012-11-09 DIAGNOSIS — F1110 Opioid abuse, uncomplicated: Secondary | ICD-10-CM | POA: Insufficient documentation

## 2012-11-09 DIAGNOSIS — Z8659 Personal history of other mental and behavioral disorders: Secondary | ICD-10-CM | POA: Insufficient documentation

## 2012-11-09 DIAGNOSIS — F1320 Sedative, hypnotic or anxiolytic dependence, uncomplicated: Secondary | ICD-10-CM | POA: Insufficient documentation

## 2012-11-09 DIAGNOSIS — F1010 Alcohol abuse, uncomplicated: Secondary | ICD-10-CM | POA: Insufficient documentation

## 2012-11-09 DIAGNOSIS — F1210 Cannabis abuse, uncomplicated: Secondary | ICD-10-CM | POA: Insufficient documentation

## 2012-11-09 DIAGNOSIS — F17200 Nicotine dependence, unspecified, uncomplicated: Secondary | ICD-10-CM | POA: Insufficient documentation

## 2012-11-09 DIAGNOSIS — Z3202 Encounter for pregnancy test, result negative: Secondary | ICD-10-CM | POA: Insufficient documentation

## 2012-11-09 HISTORY — DX: Alcohol abuse, uncomplicated: F10.10

## 2012-11-09 LAB — COMPREHENSIVE METABOLIC PANEL
AST: 58 U/L — ABNORMAL HIGH (ref 0–37)
Albumin: 4 g/dL (ref 3.5–5.2)
BUN: 8 mg/dL (ref 6–23)
Calcium: 9.2 mg/dL (ref 8.4–10.5)
Creatinine, Ser: 0.65 mg/dL (ref 0.50–1.10)
GFR calc non Af Amer: >90 mL/min (ref >90)

## 2012-11-09 LAB — URINE MICROSCOPIC-ADD ON

## 2012-11-09 LAB — CBC WITH DIFFERENTIAL/PLATELET
Basophils Absolute: 0 10*3/uL (ref 0.0–0.1)
Basophils Relative: 0 % (ref 0–1)
Eosinophils Relative: 1 % (ref 0–5)
HCT: 38.2 % (ref 36.0–46.0)
MCH: 34.9 pg — ABNORMAL HIGH (ref 26.0–34.0)
MCHC: 36.1 g/dL — ABNORMAL HIGH (ref 30.0–36.0)
MCV: 96.7 fL (ref 78.0–100.0)
Monocytes Absolute: 0.6 10*3/uL (ref 0.1–1.0)
RDW: 12.6 % (ref 11.5–15.5)

## 2012-11-09 LAB — URINALYSIS, ROUTINE W REFLEX MICROSCOPIC
Glucose, UA: NEGATIVE mg/dL
Leukocytes, UA: NEGATIVE
Nitrite: NEGATIVE
Protein, ur: NEGATIVE mg/dL

## 2012-11-09 LAB — RAPID URINE DRUG SCREEN, HOSP PERFORMED
Amphetamines: NOT DETECTED
Opiates: NOT DETECTED

## 2012-11-09 LAB — ETHANOL: Alcohol, Ethyl (B): 117 mg/dL — ABNORMAL HIGH (ref 0–11)

## 2012-11-09 MED ORDER — IBUPROFEN 400 MG PO TABS: 600.0000 mg | ORAL_TABLET | Freq: Three times a day (TID) | ORAL | Status: DC | PRN

## 2012-11-09 MED ORDER — DIPHENHYDRAMINE-ZINC ACETATE 2-0.1 % EX CREA
TOPICAL_CREAM | Freq: Once | CUTANEOUS | Status: DC
  Filled 2012-11-09: qty 28

## 2012-11-09 MED ORDER — DIPHENHYDRAMINE HCL 25 MG PO CAPS: 50.0000 mg | ORAL_CAPSULE | ORAL | Status: DC | PRN

## 2012-11-09 MED ORDER — ACETAMINOPHEN 325 MG PO TABS
650.0000 mg | ORAL_TABLET | Freq: Once | ORAL | Status: AC
  Administered 2012-11-09: 650 mg via ORAL
  Filled 2012-11-09: qty 2

## 2012-11-09 MED ORDER — NICOTINE 14 MG/24HR TD PT24
14.0000 mg | MEDICATED_PATCH | Freq: Once | TRANSDERMAL | Status: DC
  Administered 2012-11-09: 14 mg via TRANSDERMAL
  Filled 2012-11-09: qty 1

## 2012-11-09 MED ORDER — ALUM & MAG HYDROXIDE-SIMETH 200-200-20 MG/5ML PO SUSP: 30.0000 mL | ORAL | Status: DC | PRN

## 2012-11-09 MED ORDER — DIPHENHYDRAMINE HCL 25 MG PO CAPS
25.0000 mg | ORAL_CAPSULE | Freq: Once | ORAL | Status: AC
  Administered 2012-11-09: 25 mg via ORAL
  Filled 2012-11-09: qty 1

## 2012-11-09 MED ORDER — DIPHENHYDRAMINE HCL 25 MG PO CAPS
25.0000 mg | ORAL_CAPSULE | Freq: Four times a day (QID) | ORAL | Status: DC | PRN
  Administered 2012-11-09: 25 mg via ORAL
  Filled 2012-11-09: qty 1

## 2012-11-09 MED ORDER — NICOTINE 14 MG/24HR TD PT24: 14.0000 mg | MEDICATED_PATCH | Freq: Every day | TRANSDERMAL | Status: DC

## 2012-11-09 MED ORDER — ZOLPIDEM TARTRATE 5 MG PO TABS: 5.0000 mg | ORAL_TABLET | Freq: Every evening | ORAL | Status: DC | PRN

## 2012-11-09 MED ORDER — ONDANSETRON HCL 4 MG PO TABS: 4.0000 mg | ORAL_TABLET | Freq: Three times a day (TID) | ORAL | Status: DC | PRN

## 2012-11-09 MED ORDER — ACETAMINOPHEN 325 MG PO TABS: 650.0000 mg | ORAL_TABLET | ORAL | Status: DC | PRN

## 2012-11-09 MED ORDER — ONDANSETRON 4 MG PO TBDP
8.0000 mg | ORAL_TABLET | Freq: Once | ORAL | Status: DC
  Filled 2012-11-09: qty 1

## 2012-11-09 NOTE — ED Notes (Signed)
Multiple visits this past week for etoh/benz. Detox evaluation.  Pt. Left AMA each visit.

## 2012-11-09 NOTE — ED Provider Notes (Signed)
CSN: [id number]     Arrival date & time 11/09/12  0344 History     First MD Initiated Contact with Patient 11/09/12 [phone]     Chief Complaint  Patient presents with  . Medical Clearance  . Drug / Alcohol Assessment   (Consider location/radiation/quality/duration/timing/severity/associated sxs/prior Treatment) The history is provided by the patient.   32 year old female comes in requesting detox. She is an abuser of benzodiazepines, colon, and alcohol. She states that her last use of drugs was more than 24 hours ago. She denies suicidal ideation or homicidal ideation.  Past Medical History  Diagnosis Date  . Depression   . Bipolar 1 disorder   . Anxiety   . Drug abuse    No past surgical history on file. No family history on file. History  Substance Use Topics  . Smoking status: Current Every Day Smoker -- 2.00 packs/day    Types: Cigarettes  . Smokeless tobacco: Not on file  . Alcohol Use: 7.2 oz/week    12 Cans of beer per week     Comment: daily   OB History   Grav Para Term Preterm Abortions TAB SAB Ect Mult Living                 Review of Systems  All other systems reviewed and are negative.    Allergies  Review of patient's allergies indicates no known allergies.  Home Medications  No current outpatient prescriptions on file. BP 106/70  Pulse 104  Temp(Src) 98.1 F (36.7 C) (Oral)  Resp 22  Ht 5\' 2"  (1.575 m)  Wt 195 lb (88.451 kg)  BMI 35.66 kg/m2  SpO2 97%  LMP 11/05/2012 Physical Exam  Nursing note and vitals reviewed.  32 year old female, resting comfortably and in no acute distress. Vital signs are significant for borderline tachycardia with heart rate 104, and mild tachypnea with respiratory rate of 22. Oxygen saturation is 97%, which is normal. Head is normocephalic and atraumatic. PERRLA, EOMI. Oropharynx is clear. Neck is nontender and supple without adenopathy or JVD. Back is nontender and there is no CVA tenderness. Lungs are clear  without rales, wheezes, or rhonchi. Chest is nontender. Heart has regular rate and rhythm without murmur. Abdomen is soft, flat, nontender without masses or hepatosplenomegaly and peristalsis is normoactive. Extremities have no cyanosis or edema, full range of motion is present. Skin is warm and dry without rash. Neurologic: Mental status is normal, cranial nerves are intact, there are no motor or sensory deficits.  ED Course   Procedures (including critical care time)  Results for orders placed during the hospital encounter of 11/09/12  CBC WITH DIFFERENTIAL      Result Value Range   WBC 9.1  4.0 - 10.5 K/uL   RBC 3.95  3.87 - 5.11 MIL/uL   Hemoglobin 13.8  12.0 - 15.0 g/dL   HCT 11.9  14.7 - 82.9 %   MCV 96.7  78.0 - 100.0 fL   MCH 34.9 (*) 26.0 - 34.0 pg   MCHC 36.1 (*) 30.0 - 36.0 g/dL   RDW 56.2  13.0 - 86.5 %   Platelets 353  150 - 400 K/uL   Neutrophils Relative % 52  43 - 77 %   Neutro Abs 4.8  1.7 - 7.7 K/uL   Lymphocytes Relative 40  12 - 46 %   Lymphs Abs 3.7  0.7 - 4.0 K/uL   Monocytes Relative 7  3 - 12 %   Monocytes Absolute 0.6  0.1 - 1.0 K/uL   Eosinophils Relative 1  0 - 5 %   Eosinophils Absolute 0.1  0.0 - 0.7 K/uL   Basophils Relative 0  0 - 1 %   Basophils Absolute 0.0  0.0 - 0.1 K/uL  COMPREHENSIVE METABOLIC PANEL      Result Value Range   Sodium 142  135 - 145 mEq/L   Potassium 3.7  3.5 - 5.1 mEq/L   Chloride 108  96 - 112 mEq/L   CO2 19  19 - 32 mEq/L   Glucose, Bld 83  70 - 99 mg/dL   BUN 8  6 - 23 mg/dL   Creatinine, Ser 7.82  0.50 - 1.10 mg/dL   Calcium 9.2  8.4 - 95.6 mg/dL   Total Protein 7.7  6.0 - 8.3 g/dL   Albumin 4.0  3.5 - 5.2 g/dL   AST 58 (*) 0 - 37 U/L   ALT 53 (*) 0 - 35 U/L   Alkaline Phosphatase 76  39 - 117 U/L   Total Bilirubin 0.2 (*) 0.3 - 1.2 mg/dL   GFR calc non Af Amer >90  >90 mL/min   GFR calc Af Amer >90  >90 mL/min  ETHANOL      Result Value Range   Alcohol, Ethyl (B) 117 (*) 0 - 11 mg/dL  URINALYSIS, ROUTINE W  REFLEX MICROSCOPIC      Result Value Range   Color, Urine YELLOW  YELLOW   APPearance CLEAR  CLEAR   Specific Gravity, Urine 1.007  1.005 - 1.030   pH 5.5  5.0 - 8.0   Glucose, UA NEGATIVE  NEGATIVE mg/dL   Hgb urine dipstick TRACE (*) NEGATIVE   Bilirubin Urine NEGATIVE  NEGATIVE   Ketones, ur NEGATIVE  NEGATIVE mg/dL   Protein, ur NEGATIVE  NEGATIVE mg/dL   Urobilinogen, UA 1.0  0.0 - 1.0 mg/dL   Nitrite NEGATIVE  NEGATIVE   Leukocytes, UA NEGATIVE  NEGATIVE  URINE RAPID DRUG SCREEN (HOSP PERFORMED)      Result Value Range   Opiates NONE DETECTED  NONE DETECTED   Cocaine NONE DETECTED  NONE DETECTED   Benzodiazepines NONE DETECTED  NONE DETECTED   Amphetamines NONE DETECTED  NONE DETECTED   Tetrahydrocannabinol POSITIVE (*) NONE DETECTED   Barbiturates NONE DETECTED  NONE DETECTED  URINE MICROSCOPIC-ADD ON      Result Value Range   Squamous Epithelial / LPF RARE  RARE   WBC, UA 0-2  <3 WBC/hpf   RBC / HPF 0-2  <3 RBC/hpf  POCT PREGNANCY, URINE      Result Value Range   Preg Test, Ur NEGATIVE  NEGATIVE   1. Benzodiazepine abuse, continuous   2. Opiate abuse, continuous   3. Marijuana abuse   4. Alcohol abuse   5. Elevated transaminase level     MDM  Polysubstance abuse to including alcohol, tobacco, benzodiazepines, and opiates. Old records are reviewed and she has multiple ED visits where she comes in stating that she wants detox and has consistently had changed her mind and left before getting placement. This occurred twice in the last 2 days. Patient was asked if she was actually going to stay long enough to get appropriate placement at which point she became offended and asked me if I do anything at all about addiction. She will be given the benefit of the doubt and screening labs are obtained. In past visits, she has left stating that she wanted to smoke so she  is placed on a nicotine patch immediately.  Slight elevation of transaminases is noted. Alcohol level is  consistent with intoxication but not exceedingly high. ACT Team is consulted  Dione Booze, MD 11/09/12 [phone]

## 2012-11-09 NOTE — ED Notes (Signed)
Pt. Feeling anxious, wants to flip out-"go off on someone." encouraging pt. to relax and concentrate on breathing. Friend is at bedside. Pt. Refusing medications - "what are these meds. Going to do for me."

## 2012-11-09 NOTE — ED Notes (Signed)
Pt. Took meds. After refusing medications.

## 2012-11-09 NOTE — ED Notes (Signed)
[place] [employer] at bedside.

## 2012-11-09 NOTE — ED Notes (Signed)
Chronic etoh/benzo. Abuse. Last intake of benzo. 3mg  last Thursday. Unknown total pills. Drinks beers and shots of hard liquor.

## 2012-11-09 NOTE — ED Notes (Signed)
Pt. Refusing care. Leaves AMA with [employer] escorting out of hospital. Dr. Preston Fleeting aware.

## 2012-11-09 NOTE — ED Provider Notes (Signed)
Patient was advised of her lab results and also advised that ACT Team has been consulted to help set her up for a appropriate detox. Shortly after she was informed of this, she left AGAINST MEDICAL ADVICE before I could speak with her again.  Dione Booze, MD 11/09/12 [phone]

## 2012-12-21 ENCOUNTER — Encounter (HOSPITAL_COMMUNITY): Payer: Self-pay | Admitting: Emergency Medicine

## 2012-12-21 ENCOUNTER — Emergency Department (HOSPITAL_COMMUNITY)
Admission: EM | Admit: 2012-12-21 | Discharge: 2012-12-21 | Disposition: A | Discharge status: Home / Self Care | Payer: PRIVATE HEALTH INSURANCE | Attending: Emergency Medicine | Admitting: Emergency Medicine

## 2012-12-21 DIAGNOSIS — Z3202 Encounter for pregnancy test, result negative: Secondary | ICD-10-CM | POA: Insufficient documentation

## 2012-12-21 DIAGNOSIS — F1010 Alcohol abuse, uncomplicated: Secondary | ICD-10-CM | POA: Insufficient documentation

## 2012-12-21 DIAGNOSIS — F10929 Alcohol use, unspecified with intoxication, unspecified: Secondary | ICD-10-CM

## 2012-12-21 DIAGNOSIS — F3289 Other specified depressive episodes: Secondary | ICD-10-CM | POA: Insufficient documentation

## 2012-12-21 DIAGNOSIS — F329 Major depressive disorder, single episode, unspecified: Secondary | ICD-10-CM | POA: Insufficient documentation

## 2012-12-21 DIAGNOSIS — F1910 Other psychoactive substance abuse, uncomplicated: Secondary | ICD-10-CM | POA: Insufficient documentation

## 2012-12-21 DIAGNOSIS — F17200 Nicotine dependence, unspecified, uncomplicated: Secondary | ICD-10-CM | POA: Insufficient documentation

## 2012-12-21 DIAGNOSIS — F411 Generalized anxiety disorder: Secondary | ICD-10-CM | POA: Insufficient documentation

## 2012-12-21 DIAGNOSIS — R45851 Suicidal ideations: Secondary | ICD-10-CM | POA: Insufficient documentation

## 2012-12-21 LAB — CBC WITH DIFFERENTIAL/PLATELET
Basophils Relative: 1 % (ref 0–1)
Eosinophils Absolute: 0 10*3/uL (ref 0.0–0.7)
Hemoglobin: 13.9 g/dL (ref 12.0–15.0)
Lymphs Abs: 2 10*3/uL (ref 0.7–4.0)
MCH: 34.2 pg — ABNORMAL HIGH (ref 26.0–34.0)
MCHC: 35.5 g/dL (ref 30.0–36.0)
Neutro Abs: 3.2 10*3/uL (ref 1.7–7.7)
Neutrophils Relative %: 58 % (ref 43–77)
Platelets: 70 10*3/uL — ABNORMAL LOW (ref 150–400)
RBC: 4.06 MIL/uL (ref 3.87–5.11)

## 2012-12-21 LAB — RAPID URINE DRUG SCREEN, HOSP PERFORMED
Amphetamines: NOT DETECTED
Barbiturates: NOT DETECTED
Benzodiazepines: NOT DETECTED
Tetrahydrocannabinol: NOT DETECTED

## 2012-12-21 LAB — COMPREHENSIVE METABOLIC PANEL
ALT: 21 U/L (ref 0–35)
Albumin: 4 g/dL (ref 3.5–5.2)
Alkaline Phosphatase: 65 U/L (ref 39–117)
Chloride: 107 mEq/L (ref 96–112)
Potassium: 4.2 mEq/L (ref 3.5–5.1)
Sodium: 141 mEq/L (ref 135–145)
Total Bilirubin: 0.2 mg/dL — ABNORMAL LOW (ref 0.3–1.2)
Total Protein: 7.7 g/dL (ref 6.0–8.3)

## 2012-12-21 LAB — PREGNANCY, URINE: Preg Test, Ur: NEGATIVE

## 2012-12-21 MED ORDER — HALOPERIDOL LACTATE 5 MG/ML IJ SOLN
5.0000 mg | Freq: Once | INTRAMUSCULAR | Status: AC
  Administered 2012-12-21: 5 mg via INTRAMUSCULAR

## 2012-12-21 MED ORDER — LORAZEPAM 2 MG/ML IJ SOLN
1.0000 mg | Freq: Once | INTRAMUSCULAR | Status: AC
  Administered 2012-12-21: 1 mg via INTRAMUSCULAR
  Filled 2012-12-21: qty 1

## 2012-12-21 NOTE — ED Notes (Signed)
Patient placed in paper scrubs, personal belongings in bags at this time.  [employer] in triage room at this time.

## 2012-12-21 NOTE — ED Notes (Signed)
TELEPSYCH COMPLETED 

## 2012-12-21 NOTE — ED Notes (Signed)
Pt escalated.  Wanting to smoke and will not remain in her room.  Velna Hatchet EMT has bonded with patient and has attempted to de-escalate her unsuccessfully.  Panic button pushed, police/security arrived.  Lorazepam 1mg  IM given as ordered.  Pyxis down, Menno Cellar RN witnessed waste if 1mg  ativan.

## 2012-12-21 NOTE — ED Notes (Signed)
Patient brought to ED by [employer] for suicidal ideations.  Patient states that she has had ETOH, heroin, klonopin and other drugs on board.  Patient asked for help and to come to [facility] for the help.

## 2012-12-21 NOTE — ED Notes (Signed)
Breakfast ordred for pt 

## 2012-12-21 NOTE — ED Notes (Signed)
Medications have taken effect.  Pt sleeping.  IVC faxed from [facility].  Dr Redgie Grayer to sign with notary to witness.

## 2012-12-21 NOTE — ED Notes (Signed)
AC notified this RN she did not have a sitter available.  Charge ED RN notified.

## 2012-12-21 NOTE — ED Notes (Signed)
DR BELFI IN TO [facility]

## 2012-12-21 NOTE — ED Provider Notes (Signed)
CSN: [id number]     Arrival date & time 12/21/12  0258 History   First MD Initiated Contact with Patient 12/21/12 0346     Chief Complaint  Patient presents with  . Suicidal   (Consider location/radiation/quality/duration/timing/severity/associated sxs/prior Treatment) HPI Comments: 32 yo wf presents with [employer] with cc of EtOH use, drug use, and suicidal ideations.  Pt called the police from her residence.  She reportedly had a gun in her lap and had been contemplating suicide for hours.  She used EtOH, benzos, and heroin recently.     Pt currently denies SI, but she is intoxicated.  Pt states she took benzos, had done heroin in the past two days, and has been drinking "heavy".     Pt is alert and speaking w/o issue, but is confused.  She follows commands.    Patient is a 32 y.o. female presenting with mental health disorder. The history is provided by the patient and the police. The history is limited by the condition of the patient.  Mental Health Problem Presenting symptoms: agitation, disorganized speech and suicidal thoughts   Presenting symptoms: no hallucinations and no self mutilation   Degree of incapacity (severity):  Moderate Onset quality:  Gradual Duration:  1 day Timing:  Constant Chronicity:  New Context: alcohol use, drug abuse and noncompliance   Context: not medication   Treatment compliance:  Some of the time Relieved by:  None tried Worsened by:  Nothing tried Ineffective treatments:  None tried Associated symptoms: anxiety     Past Medical History  Diagnosis Date  . Depression   . Bipolar 1 disorder   . Anxiety   . Drug abuse   . ETOH abuse    History reviewed. No pertinent past surgical history. History reviewed. No pertinent family history. History  Substance Use Topics  . Smoking status: Current Every Day Smoker -- 2.00 packs/day    Types: Cigarettes  . Smokeless tobacco: Not on file  . Alcohol Use: 7.2 oz/week    12 Cans of beer per week   Comment: daily   OB History   Grav Para Term Preterm Abortions TAB SAB Ect Mult Living                 Review of Systems  Unable to perform ROS: Psychiatric disorder  HENT: Negative.   Eyes: Negative.   Respiratory: Negative.   Cardiovascular: Negative.   Gastrointestinal: Negative.   Endocrine: Negative.   Genitourinary: Negative.   Musculoskeletal: Negative.   Skin: Negative.   Psychiatric/Behavioral: Positive for suicidal ideas, behavioral problems, confusion and agitation. Negative for hallucinations, sleep disturbance, self-injury, dysphoric mood and decreased concentration. The patient is nervous/anxious. The patient is not hyperactive.     Allergies  Review of patient's allergies indicates no known allergies.  Home Medications  No current outpatient prescriptions on file. BP 92/58  Pulse 103  Temp(Src) 97.7 F (36.5 C) (Oral)  Resp 16  SpO2 96% Physical Exam  Constitutional: She is oriented to person, place, and time. She appears well-developed and well-nourished.  HENT:  Head: Normocephalic and atraumatic.  Eyes: Conjunctivae and EOM are normal. Pupils are equal, round, and reactive to light. Right eye exhibits no chemosis, no discharge and no exudate. Left eye exhibits no chemosis, no discharge and no exudate.    Neck: Normal range of motion. Neck supple. Normal carotid pulses, no hepatojugular reflux and no JVD present. No tracheal tenderness present. Carotid bruit is not present. No tracheal deviation present. No mass  and no thyromegaly present.  Cardiovascular: Normal rate and regular rhythm.   Pulmonary/Chest: Effort normal and breath sounds normal. No stridor.  Abdominal: Soft. Bowel sounds are normal. She exhibits no distension. There is no tenderness. There is no rebound and no guarding.  Musculoskeletal: Normal range of motion.  Neurological: She is alert and oriented to person, place, and time. She has normal reflexes. No cranial nerve deficit.  Coordination normal.  Normal stance  Psychiatric: Her mood appears anxious. Her affect is angry, labile and inappropriate. Her affect is not blunt. Her speech is rapid and/or pressured and tangential. Her speech is not delayed and not slurred. She is agitated, aggressive and combative. She is not slowed and not withdrawn. Thought content is delusional. Cognition and memory are impaired. She expresses impulsivity and inappropriate judgment. She does not exhibit a depressed mood. She expresses suicidal ideation. She is communicative. She is attentive.    ED Course  Procedures (including critical care time) Labs Review Labs Reviewed  CBC WITH DIFFERENTIAL - Abnormal; Notable for the following:    MCH 34.2 (*)    Platelets 70 (*)    All other components within normal limits  COMPREHENSIVE METABOLIC PANEL - Abnormal; Notable for the following:    Glucose, Bld 103 (*)    Total Bilirubin 0.2 (*)    All other components within normal limits  ETHANOL - Abnormal; Notable for the following:    Alcohol, Ethyl (B) 205 (*)    All other components within normal limits  URINE RAPID DRUG SCREEN (HOSP PERFORMED)  URINE RAPID DRUG SCREEN (HOSP PERFORMED)   Imaging Review No results found.  Results for orders placed during the hospital encounter of 12/21/12  CBC WITH DIFFERENTIAL      Result Value Range   WBC 5.5  4.0 - 10.5 K/uL   RBC 4.06  3.87 - 5.11 MIL/uL   Hemoglobin 13.9  12.0 - 15.0 g/dL   HCT 16.1  09.6 - 04.5 %   MCV 96.3  78.0 - 100.0 fL   MCH 34.2 (*) 26.0 - 34.0 pg   MCHC 35.5  30.0 - 36.0 g/dL   RDW 40.9  81.1 - 91.4 %   Platelets 70 (*) 150 - 400 K/uL   Neutrophils Relative % 58  43 - 77 %   Neutro Abs 3.2  1.7 - 7.7 K/uL   Lymphocytes Relative 36  12 - 46 %   Lymphs Abs 2.0  0.7 - 4.0 K/uL   Monocytes Relative 6  3 - 12 %   Monocytes Absolute 0.3  0.1 - 1.0 K/uL   Eosinophils Relative 0  0 - 5 %   Eosinophils Absolute 0.0  0.0 - 0.7 K/uL   Basophils Relative 1  0 - 1 %    Basophils Absolute 0.0  0.0 - 0.1 K/uL  COMPREHENSIVE METABOLIC PANEL      Result Value Range   Sodium 141  135 - 145 mEq/L   Potassium 4.2  3.5 - 5.1 mEq/L   Chloride 107  96 - 112 mEq/L   CO2 20  19 - 32 mEq/L   Glucose, Bld 103 (*) 70 - 99 mg/dL   BUN 7  6 - 23 mg/dL   Creatinine, Ser 7.82  0.50 - 1.10 mg/dL   Calcium 9.0  8.4 - 95.6 mg/dL   Total Protein 7.7  6.0 - 8.3 g/dL   Albumin 4.0  3.5 - 5.2 g/dL   AST 30  0 - 37 U/L  ALT 21  0 - 35 U/L   Alkaline Phosphatase 65  39 - 117 U/L   Total Bilirubin 0.2 (*) 0.3 - 1.2 mg/dL   GFR calc non Af Amer >90  >90 mL/min   GFR calc Af Amer >90  >90 mL/min  ETHANOL      Result Value Range   Alcohol, Ethyl (B) 205 (*) 0 - 11 mg/dL  URINE RAPID DRUG SCREEN (HOSP PERFORMED)      Result Value Range   Opiates NONE DETECTED  NONE DETECTED   Cocaine NONE DETECTED  NONE DETECTED   Benzodiazepines NONE DETECTED  NONE DETECTED   Amphetamines NONE DETECTED  NONE DETECTED   Tetrahydrocannabinol NONE DETECTED  NONE DETECTED   Barbiturates NONE DETECTED  NONE DETECTED    MDM   1. Suicidal ideation   2. Alcohol intoxication    32 yo wf with intoxication and SI. Called [employer] to bring her to the ER.  Pt needs to be medically cleared. Will obtain labwork, give Ativan, and observe.  Will need to consult TTS after labs return.  Pt continued to escalate during ER stay.  Attempts to calm her were unsuccessful.  She attempted to elope three times.  Haldol IM was given. Pt was calm and cooperative after treatment.   TTS was consulted.  TTS was paged twice without call back.  IVC paperwork was completed by me.    ER workup negative except for EtOH of 205.  UDS neg.   8:27 AM Pt transferred to C pod.  Awaiting HCG.  Will add [facility].  Care t/o to Dr. Fredderick Phenix pending TTS evaluation and dispositon.     Darlys Gales, MD 12/21/12 [phone]

## 2012-12-21 NOTE — ED Notes (Signed)
telepsych in progress 

## 2012-12-21 NOTE — ED Notes (Signed)
Pt sitting on stretcher, drinking coffee.  Noted to be jittery and restless.  Unable to do physical assessment as pt not allowing at this time.

## 2012-12-21 NOTE — ED Notes (Signed)
ivc papers refaxed to [employer]

## 2012-12-21 NOTE — ED Notes (Signed)
Pt reports she called the police tonight because she sat at home with a gun on her lap contemplating suicide for over an hour.  Reports she did heroin earlier in the day Friday, has done several klonipin, and other drugs today along with a lot of alcohol.  Stated "I just want to go out and get more messed up:"  Pt cooperative but noted to be quite on edge.  Police at bedside.  Dr into room to examine pt

## 2012-12-21 NOTE — ED Notes (Signed)
4540 no harm contract signed
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
## 2012-12-21 NOTE — ED Provider Notes (Signed)
Patient history of polysubstance abuse at present last night intoxicated and was found by police with a gun in her lap. She is currently alert and oriented and not clinically intoxicated. She is denying any suicidal ideations. She was assessed by TSS. who did not feel that she needed inpatient hospitalization. The patient has contracted for safety. She has agreed to followup with [facility] on Monday.  Rolan Bucco, MD 12/21/12 1011

## 2013-02-16 ENCOUNTER — Encounter (HOSPITAL_COMMUNITY): Payer: Self-pay | Admitting: Emergency Medicine

## 2013-02-16 ENCOUNTER — Emergency Department (HOSPITAL_COMMUNITY)
Admission: EM | Admit: 2013-02-16 | Discharge: 2013-02-16 | Discharge status: Against Medical Advice | Payer: PRIVATE HEALTH INSURANCE | Attending: Emergency Medicine | Admitting: Emergency Medicine

## 2013-02-16 DIAGNOSIS — F1110 Opioid abuse, uncomplicated: Secondary | ICD-10-CM | POA: Insufficient documentation

## 2013-02-16 DIAGNOSIS — F1010 Alcohol abuse, uncomplicated: Secondary | ICD-10-CM | POA: Insufficient documentation

## 2013-02-16 DIAGNOSIS — F1210 Cannabis abuse, uncomplicated: Secondary | ICD-10-CM | POA: Insufficient documentation

## 2013-02-16 DIAGNOSIS — F17200 Nicotine dependence, unspecified, uncomplicated: Secondary | ICD-10-CM | POA: Insufficient documentation

## 2013-02-16 DIAGNOSIS — F1310 Sedative, hypnotic or anxiolytic abuse, uncomplicated: Secondary | ICD-10-CM | POA: Insufficient documentation

## 2013-02-16 HISTORY — DX: Other psychoactive substance abuse, uncomplicated: F19.10

## 2013-02-16 LAB — CBC WITH DIFFERENTIAL/PLATELET
Basophils Absolute: 0.1 10*3/uL (ref 0.0–0.1)
Basophils Relative: 1 % (ref 0–1)
Eosinophils Absolute: 0 10*3/uL (ref 0.0–0.7)
Eosinophils Relative: 1 % (ref 0–5)
HCT: 39.1 % (ref 36.0–46.0)
Lymphocytes Relative: 46 % (ref 12–46)
MCH: 34.2 pg — ABNORMAL HIGH (ref 26.0–34.0)
MCHC: 35.5 g/dL (ref 30.0–36.0)
MCV: 96.3 fL (ref 78.0–100.0)
Monocytes Absolute: 0.2 10*3/uL (ref 0.1–1.0)
RDW: 12.5 % (ref 11.5–15.5)

## 2013-02-16 LAB — COMPREHENSIVE METABOLIC PANEL
AST: 21 U/L (ref 0–37)
CO2: 22 mEq/L (ref 19–32)
Calcium: 9.1 mg/dL (ref 8.4–10.5)
Creatinine, Ser: 0.57 mg/dL (ref 0.50–1.10)
GFR calc non Af Amer: >90 mL/min (ref >90)
Total Protein: 7.8 g/dL (ref 6.0–8.3)

## 2013-02-16 LAB — ETHANOL: Alcohol, Ethyl (B): 140 mg/dL — ABNORMAL HIGH (ref 0–11)

## 2013-02-16 LAB — RAPID URINE DRUG SCREEN, HOSP PERFORMED
Amphetamines: NOT DETECTED
Benzodiazepines: NOT DETECTED
Cocaine: NOT DETECTED

## 2013-02-16 NOTE — ED Notes (Signed)
Pt. requesting detox for alcohol abuse , marijuana , Xanax , Klonopin and Heroin , denies suicidal ideation , no visual or auditory hallucinations .

## 2013-02-16 NOTE — ED Notes (Signed)
Pt. wanded by security at triage .  

## 2013-02-16 NOTE — ED Notes (Signed)
Paper scrubs given to pt. At triage , nurse first notified security to wand pt. at triage .

## 2013-02-16 NOTE — ED Notes (Signed)
Pt out at desk- screaming that she wants to leave and needs her clothes.  Pt is not IVC- pt given belongings and escorted out by security.

## 2013-02-16 NOTE — ED Notes (Signed)
Pt to ED for detox from ETOH, heroine, and benzo's.  Pt admits to drinking alcohol and taking pills before coming to ED- admits to drinking everyday and taking pills "when I can get them".  Pt has had 5 malt liquors and 5 beers today.  Pt also admits to using heroine earlier today- states she only uses this every once in awhile.  When asked pt denies SI but states "I feel like I'm going to flip out and hurt myself sometimes", pt also states "At points I wish I could just be dead"- house coverage called for sitter.  Pt currently calm and cooperative.  Security aware of pt situation.

## 2013-02-17 NOTE — ED Provider Notes (Signed)
This patient eloped from her room and the ED before I saw her. I went to her room and the stretcher was being cleaned.   Brandt Loosen, MD 02/17/13 [phone]

## 2013-02-20 ENCOUNTER — Encounter (HOSPITAL_COMMUNITY): Payer: Self-pay | Admitting: *Deleted

## 2013-02-20 ENCOUNTER — Emergency Department (HOSPITAL_COMMUNITY)
Admission: EM | Admit: 2013-02-20 | Discharge: 2013-02-20 | Disposition: A | Discharge status: Other Acute Inpatient Hospital | Payer: PRIVATE HEALTH INSURANCE | Attending: Emergency Medicine | Admitting: Emergency Medicine

## 2013-02-20 ENCOUNTER — Encounter (HOSPITAL_COMMUNITY): Payer: Self-pay | Admitting: Emergency Medicine

## 2013-02-20 ENCOUNTER — Inpatient Hospital Stay (HOSPITAL_COMMUNITY)
Admission: AD | Admit: 2013-02-20 | Discharge: 2013-02-26 | DRG: 885 | Disposition: A | Discharge status: Home / Self Care | Payer: Federal, State, Local not specified - Other | Source: Intra-hospital | Attending: Psychiatry | Admitting: Psychiatry

## 2013-02-20 DIAGNOSIS — Z3202 Encounter for pregnancy test, result negative: Secondary | ICD-10-CM | POA: Insufficient documentation

## 2013-02-20 DIAGNOSIS — F1910 Other psychoactive substance abuse, uncomplicated: Secondary | ICD-10-CM

## 2013-02-20 DIAGNOSIS — F1210 Cannabis abuse, uncomplicated: Secondary | ICD-10-CM | POA: Insufficient documentation

## 2013-02-20 DIAGNOSIS — F1220 Cannabis dependence, uncomplicated: Secondary | ICD-10-CM

## 2013-02-20 DIAGNOSIS — F332 Major depressive disorder, recurrent severe without psychotic features: Secondary | ICD-10-CM

## 2013-02-20 DIAGNOSIS — F3110 Bipolar disorder, current episode manic without psychotic features, unspecified: Principal | ICD-10-CM | POA: Diagnosis present

## 2013-02-20 DIAGNOSIS — F17200 Nicotine dependence, unspecified, uncomplicated: Secondary | ICD-10-CM | POA: Insufficient documentation

## 2013-02-20 DIAGNOSIS — F1320 Sedative, hypnotic or anxiolytic dependence, uncomplicated: Secondary | ICD-10-CM

## 2013-02-20 DIAGNOSIS — Z79899 Other long term (current) drug therapy: Secondary | ICD-10-CM

## 2013-02-20 DIAGNOSIS — F603 Borderline personality disorder: Secondary | ICD-10-CM | POA: Diagnosis present

## 2013-02-20 DIAGNOSIS — F1010 Alcohol abuse, uncomplicated: Secondary | ICD-10-CM | POA: Diagnosis present

## 2013-02-20 DIAGNOSIS — IMO0002 Reserved for concepts with insufficient information to code with codable children: Secondary | ICD-10-CM | POA: Diagnosis present

## 2013-02-20 DIAGNOSIS — F1110 Opioid abuse, uncomplicated: Secondary | ICD-10-CM | POA: Insufficient documentation

## 2013-02-20 DIAGNOSIS — R45851 Suicidal ideations: Secondary | ICD-10-CM | POA: Insufficient documentation

## 2013-02-20 DIAGNOSIS — F1020 Alcohol dependence, uncomplicated: Secondary | ICD-10-CM

## 2013-02-20 DIAGNOSIS — F1310 Sedative, hypnotic or anxiolytic abuse, uncomplicated: Secondary | ICD-10-CM | POA: Insufficient documentation

## 2013-02-20 DIAGNOSIS — Z8659 Personal history of other mental and behavioral disorders: Secondary | ICD-10-CM | POA: Insufficient documentation

## 2013-02-20 LAB — COMPREHENSIVE METABOLIC PANEL
ALT: 16 U/L (ref 0–35)
AST: 18 U/L (ref 0–37)
CO2: 21 mEq/L (ref 19–32)
Chloride: 102 mEq/L (ref 96–112)
Creatinine, Ser: 0.56 mg/dL (ref 0.50–1.10)
GFR calc non Af Amer: >90 mL/min (ref >90)
Sodium: 138 mEq/L (ref 135–145)
Total Bilirubin: 0.3 mg/dL (ref 0.3–1.2)

## 2013-02-20 LAB — CBC
MCV: 97 fL (ref 78.0–100.0)
Platelets: 361 10*3/uL (ref 150–400)
RBC: 3.98 MIL/uL (ref 3.87–5.11)
WBC: 7.2 10*3/uL (ref 4.0–10.5)

## 2013-02-20 LAB — POCT PREGNANCY, URINE: Preg Test, Ur: NEGATIVE

## 2013-02-20 LAB — RAPID URINE DRUG SCREEN, HOSP PERFORMED
Amphetamines: NOT DETECTED
Barbiturates: NOT DETECTED
Tetrahydrocannabinol: POSITIVE — AB

## 2013-02-20 MED ORDER — ACETAMINOPHEN 325 MG PO TABS
650.0000 mg | ORAL_TABLET | ORAL | Status: DC | PRN
  Administered 2013-02-20: 650 mg via ORAL
  Filled 2013-02-20: qty 2

## 2013-02-20 MED ORDER — ACETAMINOPHEN 325 MG PO TABS: 650.0000 mg | ORAL_TABLET | Freq: Four times a day (QID) | ORAL | Status: DC | PRN

## 2013-02-20 MED ORDER — NICOTINE 21 MG/24HR TD PT24
21.0000 mg | MEDICATED_PATCH | Freq: Every day | TRANSDERMAL | Status: DC
  Administered 2013-02-21 – 2013-02-26 (×6): 21 mg via TRANSDERMAL
  Filled 2013-02-20 (×9): qty 1

## 2013-02-20 MED ORDER — OLANZAPINE 5 MG PO TBDP
5.0000 mg | ORAL_TABLET | Freq: Three times a day (TID) | ORAL | Status: DC | PRN
  Administered 2013-02-20 – 2013-02-21 (×2): 5 mg via ORAL
  Filled 2013-02-20 (×2): qty 1

## 2013-02-20 MED ORDER — ONDANSETRON HCL 4 MG PO TABS: 4.0000 mg | ORAL_TABLET | Freq: Three times a day (TID) | ORAL | Status: DC | PRN

## 2013-02-20 MED ORDER — HYDROXYZINE HCL 25 MG PO TABS
25.0000 mg | ORAL_TABLET | Freq: Four times a day (QID) | ORAL | Status: DC | PRN
  Administered 2013-02-22 – 2013-02-26 (×6): 25 mg via ORAL
  Filled 2013-02-20 (×6): qty 1

## 2013-02-20 MED ORDER — IBUPROFEN 200 MG PO TABS: 600.0000 mg | ORAL_TABLET | Freq: Three times a day (TID) | ORAL | Status: DC | PRN

## 2013-02-20 MED ORDER — NICOTINE 21 MG/24HR TD PT24
21.0000 mg | MEDICATED_PATCH | Freq: Every day | TRANSDERMAL | Status: DC
  Administered 2013-02-20: 21 mg via TRANSDERMAL
  Filled 2013-02-20: qty 1

## 2013-02-20 MED ORDER — LORAZEPAM 1 MG PO TABS
1.0000 mg | ORAL_TABLET | Freq: Three times a day (TID) | ORAL | Status: DC | PRN
  Administered 2013-02-20: 1 mg via ORAL
  Filled 2013-02-20: qty 1

## 2013-02-20 MED ORDER — CHLORDIAZEPOXIDE HCL 25 MG PO CAPS
25.0000 mg | ORAL_CAPSULE | Freq: Four times a day (QID) | ORAL | Status: AC | PRN
  Administered 2013-02-20 – 2013-02-23 (×7): 25 mg via ORAL
  Filled 2013-02-20 (×7): qty 1

## 2013-02-20 MED ORDER — TRAZODONE HCL 100 MG PO TABS
100.0000 mg | ORAL_TABLET | Freq: Every evening | ORAL | Status: DC | PRN
  Administered 2013-02-20 – 2013-02-23 (×4): 100 mg via ORAL
  Filled 2013-02-20 (×4): qty 1

## 2013-02-20 MED ORDER — ALUM & MAG HYDROXIDE-SIMETH 200-200-20 MG/5ML PO SUSP
30.0000 mL | ORAL | Status: DC | PRN
  Administered 2013-02-22 – 2013-02-26 (×5): 30 mL via ORAL

## 2013-02-20 MED ORDER — ALUM & MAG HYDROXIDE-SIMETH 200-200-20 MG/5ML PO SUSP: 30.0000 mL | ORAL | Status: DC | PRN

## 2013-02-20 MED ORDER — MAGNESIUM HYDROXIDE 400 MG/5ML PO SUSP: 30.0000 mL | Freq: Every day | ORAL | Status: DC | PRN
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
## 2013-02-20 NOTE — ED Notes (Signed)
Presents requesting detox due to "the cops made me come or were going to take me to jail. My friend called the cops on me and they told me to come here or I go to jail. I guess I am want ing detox from benzo's and alcohol. Mostly benzos.  I smoked a lot of pot tooo and I occasionally due heroin" Last drink at 2AM, took a klonopin at 5 pm.

## 2013-02-20 NOTE — ED Notes (Signed)
In to speak to pt at her request. Pt cursing stating i still dont understand why i am "fu*&^%" here. Pt voice excelating. Explained to patient that she is ivc. Pt states that she is not suicidal. States she wants to leave. " i dont want no [id number]" help. States i am impatient. Told pt when she is able to speak in a calm voice i will be happy to address her concerns.

## 2013-02-20 NOTE — ED Notes (Signed)
Pt has been placed in blue paper scrubs, and wanded by security

## 2013-02-20 NOTE — ED Notes (Signed)
OFFICER ISLEY WITH [employer] HERE TO TRANSPORT PT TO [facility]

## 2013-02-20 NOTE — ED Notes (Signed)
House coverage notified of need for sitter 

## 2013-02-20 NOTE — ED Notes (Signed)
[facility] called to transport pt to behavioral health

## 2013-02-20 NOTE — ED Notes (Signed)
Per [employer] Pt texted friend with statements of SI and picture of weapon she was going to use. Picture of Bowie knife

## 2013-02-20 NOTE — Progress Notes (Signed)
[name], MHT provided therapeutic intervention with patient who was becoming agitated and irritable after tele assessment had been completed. Patient was shouting profanity at security, [employer], and nurse while in POD-C, demanding to leave that she was not suicidal nor homicidal and did not want any help. Writer provided supportive guidance to patient and after moments she was able and willing to calm down. Patient expressed to this writer that she did not want inpatient treatment and was as we speak craving drugs along with smoking sensation. Writer informed patient that he would inform her of the recommendation that has been given and provide her with that recommendation. Patient is currently on IVC but examination and recommendation has not been completed. Writer will assist with completion of this process at this time.

## 2013-02-20 NOTE — Progress Notes (Signed)
Patient ID: [name], female   DOB: 1980/05/19, 32 y.o.   MRN: [id number] D:  32 year old Caucasian female admitted for detox.  She is here on an involuntary commitment.  Patient states she uses benzos and THC from the time she wakes up to the time she passes out at night.  Also uses alcohol several times per week.  States she has been using benzos and alcohol at work and has not been caught with alcohol yet, but is scared it may happen.  She is concerned about her job (works at [employer]).  States she started using at the age of 32 and the longest sobriety she has had was 6 months in 2000 or 2001 when she was going to church and AA on a regular basis.  She is irritable.  She does not plan on going to rehab from here.  States she wants to get back into AA.   A:  Admission completed.  Patient oriented to the unit, to her room, and to the unit schedule.  She was given food and a drink from the cafeteria.  She consumed most of the meal after stating she had not eaten in days and had no appetite.  Skin assessment was completed in the search room.  She has a few well healed tattoos and scars.  She also has a plantar wart on her right foot, otherwise intact.  R:  Cooperative with the interview process, but irritable at times.  States she just wants to leave here so that she can get high.

## 2013-02-20 NOTE — Progress Notes (Signed)
D:Patient in bed at the beginning of the shift. She appeared sad, irritable and somewhat agitated. She reported that she wants to be here and she does not want to be here;"If I was no sent here involuntarily, I will just go home" . She said she is loosing a lot of money by been here.  She reported she has been doing drugs and drinking alcohol at work; and has been stealing to support her habits. She denied SI/HI and denied hallucinations. She endorsed feeling angry and irritable. A: Writer offered patient Zyprexa and Librium for irritability, agitation and withdrawal symptoms. Writer encouraged patient to attend Karaoke.  R: Patient received her medications without complaint or difficulty. Q 15 minute check continues to maintain safety.
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
## 2013-02-20 NOTE — ED Notes (Signed)
Patient admits to getting drunk last night and doing something "stupid i didn't mean"

## 2013-02-20 NOTE — ED Notes (Signed)
Spoke to eric at [facility]. He has assigned pt a room, he asks that the pt come after 330

## 2013-02-20 NOTE — ED Notes (Signed)
telepsych in progress 

## 2013-02-20 NOTE — ED Notes (Signed)
Security paged to wand pt 

## 2013-02-20 NOTE — ED Provider Notes (Signed)
CSN: [id number]     Arrival date & time 02/20/13  0245 History   First MD Initiated Contact with Patient 02/20/13 0341     Chief Complaint  Patient presents with  . detox    (Consider location/radiation/quality/duration/timing/severity/associated sxs/prior Treatment) HPI Comments: 32 yo female brought in by police for polysubstance abuse and suicidal plan. Pt was sending pictures and saying she would kill herself to a friend, she even showed the weapon.  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  Etoh today.  Denies new drug ingestion.  Nothing improves thoughts of suicide.  No other medical sxs.    The history is provided by the patient.    Past Medical History  Diagnosis Date  . Depression   . Bipolar 1 disorder   . Anxiety   . Drug abuse   . ETOH abuse   . Polysubstance abuse    History reviewed. No pertinent past surgical history. History reviewed. No pertinent family history. History  Substance Use Topics  . Smoking status: Current Every Day Smoker -- 2.00 packs/day    Types: Cigarettes  . Smokeless tobacco: Not on file  . Alcohol Use: 7.2 oz/week    12 Cans of beer per week     Comment: daily   OB History   Grav Para Term Preterm Abortions TAB SAB Ect Mult Living                 Review of Systems  Constitutional: Negative for fever and chills.  HENT: Negative for congestion.   Eyes: Negative for visual disturbance.  Respiratory: Negative for shortness of breath.   Cardiovascular: Negative for chest pain.  Gastrointestinal: Negative for vomiting and abdominal pain.  Genitourinary: Negative for dysuria and flank pain.  Musculoskeletal: Negative for back pain, neck pain and neck stiffness.  Skin: Negative for rash.  Neurological: Negative for light-headedness and headaches.  Psychiatric/Behavioral: Positive for suicidal ideas.    Allergies  Review of patient's allergies indicates no known allergies.  Home Medications   Current Outpatient  Rx  Name  Route  Sig  Dispense  Refill  . Aspirin-Acetaminophen-Caffeine (GOODY HEADACHE PO)   Oral   Take 1 packet by mouth 2 (two) times daily as needed.         . Diphenhydramine-APAP, sleep, (GOODY PM PO)   Oral   Take 1-2 packets by mouth at bedtime as needed (for sleep).         Marland Kitchen ibuprofen (ADVIL,MOTRIN) 200 MG tablet   Oral   Take 600 mg by mouth every 6 (six) hours as needed.          BP 109/82  Pulse 112  Temp(Src) 97.4 F (36.3 C) (Oral)  Resp 16  Wt 205 lb (92.987 kg)  SpO2 95%  LMP 02/12/2013 Physical Exam  Nursing note and vitals reviewed. Constitutional: She is oriented to person, place, and time. She appears well-developed and well-nourished.  HENT:  Head: Normocephalic and atraumatic.  Eyes: Conjunctivae are normal. Right eye exhibits no discharge. Left eye exhibits no discharge.  Neck: Normal range of motion. Neck supple. No tracheal deviation present.  Cardiovascular: Regular rhythm.   Pulmonary/Chest: Effort normal and breath sounds normal.  Abdominal: Soft. She exhibits no distension. There is no tenderness. There is no guarding.  Musculoskeletal: She exhibits no edema.  Neurological: She is alert and oriented to person, place, and time.  Skin: Skin is warm. No rash noted.  Psychiatric: Her affect is blunt. She expresses suicidal  ideation. She expresses no homicidal ideation. She expresses suicidal plans. She expresses no homicidal plans.    ED Course  Procedures (including critical care time) Labs Review Labs Reviewed  CBC - Abnormal; Notable for the following:    MCH 34.7 (*)    All other components within normal limits  ETHANOL - Abnormal; Notable for the following:    Alcohol, Ethyl (B) 100 (*)    All other components within normal limits  SALICYLATE LEVEL - Abnormal; Notable for the following:    Salicylate Lvl <2.0 (*)    All other components within normal limits  URINE RAPID DRUG SCREEN (HOSP PERFORMED) - Abnormal; Notable for the  following:    Tetrahydrocannabinol POSITIVE (*)    All other components within normal limits  ACETAMINOPHEN LEVEL  COMPREHENSIVE METABOLIC PANEL  POCT PREGNANCY, URINE   Imaging Review No results found.  EKG Interpretation   None       MDM  No diagnosis found. Patient is moderate to high risk with discussing and showing pictures of suicidal plan. Medically clear at this time.  TTS consult pending. Patient is not to be sent home until psychiatrist evaluates.   Police filled IVC paperwork.   Suicidal plan/ ideation, Polysubstance abuse     Enid Skeens, MD 02/20/13 [phone]

## 2013-02-20 NOTE — ED Notes (Signed)
Security called to escort pt to her room. Pt at desk raising her voice. "i want to get my s%^& and get up out of here". Have explained to pt that we are we are willing to work with her but she cannot yell at the desk. She has been reevaluated by a [facility] provider. We are awaiting a disposition note

## 2013-02-20 NOTE — Tx Team (Signed)
Initial Interdisciplinary Treatment Plan  PATIENT STRENGTHS: (choose at least two) Average or above average intelligence Capable of independent living Communication skills General fund of knowledge Physical Health  PATIENT STRESSORS: Financial difficulties Legal issue Occupational concerns Substance abuse   PROBLEM LIST: Problem List/Patient Goals Date to be addressed Date deferred Reason deferred Estimated date of resolution  "I want to learn healthier ways to control my anger." 20 Feb 2013     "I want help to stop using." 20 Feb 2013     "I want to feel some relief from depression." 20 Feb 2013                                          DISCHARGE CRITERIA:  Ability to meet basic life and health needs Adequate post-discharge living arrangements Improved stabilization in mood, thinking, and/or behavior Motivation to continue treatment in a less acute level of care  PRELIMINARY DISCHARGE PLAN: Attend aftercare/continuing care group Attend 12-step recovery group Outpatient therapy  PATIENT/FAMIILY INVOLVEMENT: This treatment plan has been presented to and reviewed with the patient, [name], and/or family member.  The patient and family have been given the opportunity to ask questions and make suggestions.  Izola Price Mae 02/20/2013, 7:39 PM

## 2013-02-20 NOTE — ED Notes (Signed)
PT CONSULT WITH PSYCH PROVIDER WILL NOT BE UNTIL LATER THIS AFTERNOON.

## 2013-02-21 DIAGNOSIS — IMO0002 Reserved for concepts with insufficient information to code with codable children: Secondary | ICD-10-CM | POA: Diagnosis present

## 2013-02-21 MED ORDER — PANTOPRAZOLE SODIUM 40 MG PO TBEC
40.0000 mg | DELAYED_RELEASE_TABLET | Freq: Every day | ORAL | Status: DC
  Administered 2013-02-21 – 2013-02-26 (×6): 40 mg via ORAL
  Filled 2013-02-21 (×3): qty 1
  Filled 2013-02-21: qty 14
  Filled 2013-02-21 (×5): qty 1

## 2013-02-21 MED ORDER — IBUPROFEN 200 MG PO TABS
400.0000 mg | ORAL_TABLET | Freq: Four times a day (QID) | ORAL | Status: DC | PRN
  Administered 2013-02-21 – 2013-02-26 (×5): 400 mg via ORAL
  Filled 2013-02-21 (×5): qty 2

## 2013-02-21 MED ORDER — CARBAMAZEPINE ER 200 MG PO TB12
200.0000 mg | ORAL_TABLET | Freq: Two times a day (BID) | ORAL | Status: DC
  Administered 2013-02-21 – 2013-02-26 (×10): 200 mg via ORAL
  Filled 2013-02-21 (×10): qty 1
  Filled 2013-02-21: qty 28
  Filled 2013-02-21: qty 1
  Filled 2013-02-21: qty 28
  Filled 2013-02-21: qty 1

## 2013-02-21 MED ORDER — OLANZAPINE 10 MG PO TBDP
10.0000 mg | ORAL_TABLET | Freq: Three times a day (TID) | ORAL | Status: DC | PRN
  Administered 2013-02-21 – 2013-02-23 (×5): 10 mg via ORAL
  Filled 2013-02-21 (×5): qty 1

## 2013-02-21 MED ORDER — ONDANSETRON HCL 4 MG PO TABS
4.0000 mg | ORAL_TABLET | Freq: Three times a day (TID) | ORAL | Status: DC | PRN
  Administered 2013-02-22: 4 mg via ORAL
  Filled 2013-02-21 (×3): qty 1

## 2013-02-21 MED ORDER — FLUOXETINE HCL 20 MG PO CAPS
20.0000 mg | ORAL_CAPSULE | Freq: Every day | ORAL | Status: DC
  Administered 2013-02-21 – 2013-02-26 (×6): 20 mg via ORAL
  Filled 2013-02-21 (×2): qty 1
  Filled 2013-02-21: qty 14
  Filled 2013-02-21 (×6): qty 1

## 2013-02-21 NOTE — Progress Notes (Signed)
Patient ID: [name], female   DOB: 08/24/1980, 32 y.o.   MRN: [id number] 02-21-13 nursing shift note: D: pt stated she was having a "hard morning" due to anxiety/agitation. She is a fall risk and was having some stomach pain. A: pt gave her a Zyprexa prn for  agitation, ibuprofen prn for stomach pain and a librium prn for anxiety/withdrawal symptoms. R: after lunch she went to her room and went to sleep.  on her inventory sheet she wrote: slept poor and requested medication, appetite poor, energy low, attention poor with her depression at 8 and hopelessness at 9. W/d symptoms have been cravings, agitation sedation and chilling. She denied any thoughts about suicide or hurting herself. Physical problems in the last 24 hours have been headaches and pain, she said. RN will monitor and Q 15 min ck's continue.
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
## 2013-02-21 NOTE — [facility] Group Notes (Signed)
[facility] LCSW Group Therapy  02/21/2013 2:43 PM  Type of Therapy:  Group Therapy   Participation Level:  Did not attend.    Summary of Progress/Problems:  Chaplain was here to lead a group on themes of hope and courage.   [name]   02/21/2013  2:43 PM

## 2013-02-21 NOTE — Tx Team (Signed)
  Interdisciplinary Treatment Plan Update   Date Reviewed:  02/21/2013  Time Reviewed:  8:02 AM  Progress in Treatment:   Attending groups: Yes Participating in groups: Yes Taking medication as prescribed: Yes  Tolerating medication: Yes Family/Significant other contact made: No Patient understands diagnosis: Yes  As evidenced by asking for help with detox, mood stabilization Discussing patient identified problems/goals with staff: Yes Medical problems stabilized or resolved: Yes Denies suicidal/homicidal ideation: Yes  In tx team Patient has not harmed self or others: Yes  For review of initial/current patient goals, please see plan of care.  Estimated Length of Stay:  4-5 days  Reason for Continuation of Hospitalization: Delusions  Depression Medication stabilization Withdrawal symptoms Other; describe Mood stabilization  New Problems/Goals identified:  N/A  Discharge Plan or Barriers:   return home, follow up outpt  Additional Comments:  [name] is an 32 y.o. female.  She sts she doesn't remember what she did last night b/c "I was messed up".Pt's BAL was 100 upon arrival and UDS was + for THC. Pt sts she drank five bottles of Mad Dawg and 4 beers last night. She also smoked $20 of THC. Pt sts she uses THC and Klonopin daily and drinks twice a week.   Per [employer] Pt texted friend with statements of SI and picture of weapon she was going to use. Picture of Bowie knife   Attendees:  Signature: Thedore Mins, MD 02/21/2013 8:02 AM   Signature: Richelle Ito, LCSW 02/21/2013 8:02 AM  Signature: Fransisca Kaufmann, NP 02/21/2013 8:02 AM  Signature: Joslyn Devon, RN 02/21/2013 8:02 AM  Signature: Liborio Nixon, RN 02/21/2013 8:02 AM  Signature:  02/21/2013 8:02 AM  Signature:   02/21/2013 8:02 AM  Signature:    Signature:    Signature:    Signature:    Signature:    Signature:      Scribe for Treatment Team:   Richelle Ito, LCSW  02/21/2013 8:02 AM

## 2013-02-21 NOTE — Progress Notes (Signed)
NUTRITION ASSESSMENT  Pt identified as at risk on the Malnutrition Screen Tool  INTERVENTION: 1. Educated patient on the importance of nutrition and encouraged intake of food and beverages. 2. Discussed weight goals.   NUTRITION DIAGNOSIS: Unintentional weight loss related to sub-optimal intake as evidenced by pt report.   Goal: Pt to meet >/= 90% of their estimated nutrition needs.  Monitor:  PO intake  Assessment:  Patient admitted with major depression, recurrent severe (SI) and substance abuse (THC, Klonopin, etoh).  Patient reports decreased appetite for the past 2 weeks.  UBW 200-210 lbs with decrease to 197 lbs.  Currently states that she has a decreased intake due to stomach cramping.  Discussed that she should discuss this with MD if this continues.  32 y.o. female  Height: Ht Readings from Last 1 Encounters:  02/20/13 5\' 2"  (1.575 m)    Weight: Wt Readings from Last 1 Encounters:  02/20/13 197 lb (89.359 kg)    Weight Hx: Wt Readings from Last 10 Encounters:  02/20/13 197 lb (89.359 kg)  02/20/13 205 lb (92.987 kg)  02/16/13 203 lb (92.08 kg)  11/09/12 195 lb (88.451 kg)  11/22/11 205 lb (92.987 kg)  11/21/11 205 lb (92.987 kg)    BMI:  Body mass index is 36.02 kg/(m^2). Pt meets criteria for obesity grade 2 based on current BMI.  Estimated Nutritional Needs: Kcal: 25-30 kcal/kg Protein: > 1 gram protein/kg Fluid: 1 ml/kcal  Diet Order: General Pt is also offered choice of unit snacks mid-morning and mid-afternoon.  Pt is eating as desired.   Lab results and medications reviewed.   Oran Rein, RD, LDN Clinical Inpatient Dietitian Pager:  [phone] Weekend and after hours pager:  [phone]
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
## 2013-02-21 NOTE — [facility] Suicide Risk Assessment (Signed)
Suicide Risk Assessment  Admission Assessment     Nursing information obtained from:  Patient Demographic factors:  Caucasian;Gay, lesbian, or bisexual orientation;Low socioeconomic status Current Mental Status:  Suicidal ideation indicated by patient Loss Factors:  Financial problems / change in socioeconomic status Historical Factors:  Prior suicide attempts;Family history of suicide;Family history of mental illness or substance abuse;Domestic violence in family of origin Risk Reduction Factors:  Employed;Religious beliefs about death;Living with another person, especially a relative  CLINICAL FACTORS:   Severe Anxiety and/or Agitation Depression:   Aggression Anhedonia Comorbid alcohol abuse/dependence Hopelessness Impulsivity Insomnia Alcohol/Substance Abuse/Dependencies Previous Psychiatric Diagnoses and Treatments  COGNITIVE FEATURES THAT CONTRIBUTE TO RISK:  Closed-mindedness Polarized thinking    SUICIDE RISK:   Mild:  Suicidal ideation of limited frequency, intensity, duration, and specificity.  There are no identifiable plans, no associated intent, mild dysphoria and related symptoms, good self-control (both objective and subjective assessment), few other risk factors, and identifiable protective factors, including available and accessible social support.  PLAN OF CARE:1. Admit for crisis management and stabilization. 2. Medication management to reduce current symptoms to base line and improve the     patient's overall level of functioning 3. Treat health problems as indicated. 4. Develop treatment plan to decrease risk of relapse upon discharge and the need for     readmission. 5. Psycho-social education regarding relapse prevention and self care. 6. Health care follow up as needed for medical problems. 7. Restart home medications where appropriate.   I certify that inpatient services furnished can reasonably be expected to improve the patient's condition.  Galen Russman,  Marilin Kofman,MD 02/21/2013, 11:23 AM

## 2013-02-21 NOTE — Progress Notes (Signed)
Patient ID: [name], female   DOB: October 10, 1980, 32 y.o.   MRN: [id number] Patient observed sleeping. Upon waking patient reported irritation and anxiety. Denies SI, HI, AVH. Contracts for safety. Patient stated that she attended some groups but spent a lot of the day sleeping d/t c/o cramps, aching, and agitation. Patient plans to work on depression and anger, and stopping drug use while at [facility].   Encouragement offered. Patient received zyprexa and trazodone. Patient relaxing in dayroom.  Patient safety maintained. Q 15 minute checks continue.

## 2013-02-21 NOTE — [facility] Suicide Risk Assessment (Signed)
[facility] INPATIENT:  Family/Significant Other Suicide Prevention Education  Suicide Prevention Education:  Education Completed; Moses Manners [phone] has been identified by the patient as the family member/significant other with whom the patient will be residing, and identified as the person(s) who will aid the patient in the event of a mental health crisis (suicidal ideations/suicide attempt).  With written consent from the patient, the family member/significant other has been provided the following suicide prevention education, prior to the and/or following the discharge of the patient.  The suicide prevention education provided includes the following:  Suicide risk factors  Suicide prevention and interventions  National Suicide Hotline telephone number  [facility] assessment telephone number  [facility] Emergency Assistance 911  [facility] and/or Residential Mobile Crisis Unit telephone number  Request made of family/significant other to:  Remove weapons (e.g., guns, rifles, knives), all items previously/currently identified as safety concern.    Remove drugs/medications (over-the-counter, prescriptions, illicit drugs), all items previously/currently identified as a safety concern.  The family member/significant other verbalizes understanding of the suicide prevention education information provided.  The family member/significant other agrees to remove the items of safety concern listed above.  Simona Huh 02/21/2013, 3:25 PM

## 2013-02-21 NOTE — [facility] Group Notes (Signed)
[facility] LCSW Aftercare Discharge Planning Group Note   02/21/2013 8:02 AM  Participation Quality:  Engaged  Mood/Affect:  Depressed  Depression Rating:  8  Anxiety Rating:  8  Thoughts of Suicide:  No Will you contract for safety?   NA  Current AVH:  No  Plan for Discharge/Comments:  [name] states the police made her come in.  "It was thought or going to jail.  I was set up."  Eventually admits to sending a txt indicating SI, but denies means or intent.  "I just needed help."  States she is here for detox and help with mood stabilization.  Will return home at d/c, follow up [facility] and AA.  "I have a potential sponsor.  She told me to come here first."  Transportation Means: roomate  Supports: [employer], [place]

## 2013-02-21 NOTE — Progress Notes (Signed)
Recreation Therapy Notes  Date: 11.14.2014 Time: 9:30am Location: [address] Dayroom  Group Topic: Self-Expression  Goal Area(s) Addresses:  Patient will identify use art as a means of self-expression.  Patient will identify why they chose to draw what they did.  Patient will identify benefit of using self-expression.   Behavioral Response: Disengaged  Intervention: Art  Activity: LRT played instrumental music and asked patients to draw freely. Patients were provided paper, coloring pencils and crayons to complete their drawing.   Education:  Pharmacologist, Building control surveyor.   Education Outcome: Acknowledges understanding  Clinical Observations/Feedback: Patient attended group session, but failed to engaged in activity. Patient remained in group session for entire group, but did not draw anything on her paper.    Marykay Lex Zakee Deerman, LRT/CTRS  Jearl Klinefelter 02/21/2013 12:42 PM

## 2013-02-22 NOTE — [facility] Group Notes (Signed)
[facility] Group Notes:  (Clinical Social Work)  02/22/2013  11:15-11:45AM  Summary of Progress/Problems:   The main focus of today's process group was for the patient to identify ways in which they have in the past sabotaged their own recovery and reasons they may have done this/what they received from doing it.  We then worked to identify a specific plan to avoid doing this when discharged from the hospital for this admission.  The patient expressed that the biggest thing that keeps her coming back to the hospital is not taking her medication, which happens because (1) she does not like the side effects, (2) she starts to believe she doesn't need the medication, and (3) she hates to go to her provider at [facility] because it takes so long to be seen.  Each of these elements was discussed and a variety of suggestions were given that she endorsed a willingness to try.  Type of Therapy:  Group Therapy - Process  Participation Level:  Active  Participation Quality:  Attentive, Sharing and Supportive  Affect:  Blunted  Cognitive:  Appropriate and Oriented  Insight:  Engaged  Engagement in Therapy:  Engaged  Modes of Intervention:  Clarification, Education, Exploration, Discussion  Ambrose Mantle, LCSW 02/22/2013, 12:44 PM

## 2013-02-22 NOTE — Progress Notes (Signed)
Patient ID: [name], female   DOB: 11-Feb-1981, 32 y.o.   MRN: [id number] D)  Programmed on the [address] hall this evening.  Stated she was upset about being here at first, but she had stopped attended AA groups for several months and had gotten into a bad pattern and had stopped caring.  Now that she is here and going to groups again, she states she realizes she needed to get back on track.  States she has a new sponsor and feels she wants to do well, feels this is a good time to be here, before the holidays.  Affect is flat, sad, but seems sfocused on making the changes necessary.  Denies thoughts of self harm. A)  Support, encouragement, continue to monitor for safety, continue POC R)  Receptive, positive, safety maintained.

## 2013-02-22 NOTE — Progress Notes (Signed)
[facility] MD Progress Note  02/22/2013 11:03 AM [name]  MRN:  [id number]  Subjective:  Met with the patient 1:1 today. She notes that she is feeling better now, and stated that her appetite is improving, her mood is "less crappy", her depression is down to a 6/10 down from an 8-9/10 upon admission and she feels better physically since her admission.       She is tolerating her medication better, attending groups today and is thinking about the future. She hopes to return to AA and is hopefull that she will be able to get her sponsor on board.  She would like to attend AA meetings on the [address] [place] if possible.  Diagnosis:   DSM5: Assessment:  AXIS I: Bipolar, Manic and Substance Abuse  AXIS II: Borderline Personality Dis.  AXIS III: obesity  Past Medical History   Diagnosis  Date   .  Depression    .  Bipolar 1 disorder    .  Anxiety    .  Drug abuse    .  ETOH abuse    .  Polysubstance abuse     AXIS IV: economic problems and other psychosocial or environmental problems  AXIS V: 21-30 behavior considerably influenced by delusions or hallucinations OR serious impairment in judgment, communication OR inability to function in almost all areas  ADL's:  Intact  Sleep: Good  Appetite:  Good  Suicidal Ideation:  denies Homicidal Ideation:  denies AEB (as evidenced by):  Psychiatric Specialty Exam: Review of Systems  Constitutional: Negative.  Negative for fever, chills, weight loss, malaise/fatigue and diaphoresis.  HENT: Negative for congestion and sore throat.   Eyes: Negative for blurred vision, double vision and photophobia.  Respiratory: Negative for cough, shortness of breath and wheezing.   Cardiovascular: Negative for chest pain, palpitations and PND.  Gastrointestinal: Negative for heartburn, nausea, vomiting, abdominal pain, diarrhea and constipation.  Musculoskeletal: Negative for falls, joint pain and myalgias.  Neurological: Negative for dizziness,  tingling, tremors, sensory change, speech change, focal weakness, seizures, loss of consciousness, weakness and headaches.  Endo/Heme/Allergies: Negative for polydipsia. Does not bruise/bleed easily.  Psychiatric/Behavioral: Negative for depression, suicidal ideas, hallucinations, memory loss and substance abuse. The patient is not nervous/anxious and does not have insomnia.     Blood pressure 108/76, pulse 102, temperature 98.2 F (36.8 C), temperature source Oral, resp. rate 18, height 5\' 2"  (1.575 m), weight 89.359 kg (197 lb), last menstrual period 02/12/2013.Body mass index is 36.02 kg/(m^2).  General Appearance: Fairly Groomed  Patent attorney::  Good  Speech:  Clear and Coherent  Volume:  Normal  Mood:  Dysphoric  Affect:  Congruent  Thought Process:  Goal Directed  Orientation:  Full (Time, Place, and Person)  Thought Content:  WDL  Suicidal Thoughts:  No  Homicidal Thoughts:  No  Memory:  Immediate;   Fair  Judgement:  Fair  Insight:  Shallow  Psychomotor Activity:  Decreased  Concentration:  Fair  Recall:  Fair  Akathisia:  No  Handed:  Right  AIMS (if indicated):     Assets:  Communication Skills Physical Health  Sleep:  Number of Hours: 6   Current Medications: Current Facility-Administered Medications  Medication Dose Route Frequency Provider Last Rate Last Dose  . alum & mag hydroxide-simeth (MAALOX/MYLANTA) 200-200-20 MG/5ML suspension 30 mL  30 mL Oral Q4H PRN Fransisca Kaufmann, NP      . carbamazepine (TEGRETOL XR) 12 hr tablet 200 mg  200 mg Oral BID Vernona Rieger  Earlene Plater, NP   200 mg at 02/22/13 0806  . chlordiazePOXIDE (LIBRIUM) capsule 25 mg  25 mg Oral Q6H PRN Fransisca Kaufmann, NP   25 mg at 02/22/13 0807  . FLUoxetine (PROZAC) capsule 20 mg  20 mg Oral Daily Mojeed Akintayo   20 mg at 02/22/13 0807  . hydrOXYzine (ATARAX/VISTARIL) tablet 25 mg  25 mg Oral Q6H PRN Fransisca Kaufmann, NP   25 mg at 02/22/13 0807  . ibuprofen (ADVIL,MOTRIN) tablet 400 mg  400 mg Oral Q6H PRN Mojeed Akintayo    400 mg at 02/22/13 1008  . magnesium hydroxide (MILK OF MAGNESIA) suspension 30 mL  30 mL Oral Daily PRN Fransisca Kaufmann, NP      . nicotine (NICODERM CQ - dosed in mg/24 hours) patch 21 mg  21 mg Transdermal Daily Fransisca Kaufmann, NP   21 mg at 02/22/13 0807  . OLANZapine zydis (ZYPREXA) disintegrating tablet 10 mg  10 mg Oral Q8H PRN Mojeed Akintayo   10 mg at 02/22/13 0807  . ondansetron (ZOFRAN) tablet 4 mg  4 mg Oral Q8H PRN Fransisca Kaufmann, NP      . pantoprazole (PROTONIX) EC tablet 40 mg  40 mg Oral Daily Fransisca Kaufmann, NP   40 mg at 02/22/13 0806  . traZODone (DESYREL) tablet 100 mg  100 mg Oral QHS PRN Fransisca Kaufmann, NP   100 mg at 02/21/13 2208    Lab Results: No results found for this or any previous visit (from the past 48 hour(s)).  Physical Findings: AIMS: Facial and Oral Movements Muscles of Facial Expression: None, normal Lips and Perioral Area: None, normal Jaw: None, normal Tongue: None, normal,Extremity Movements Upper (arms, wrists, hands, fingers): None, normal Lower (legs, knees, ankles, toes): None, normal, Trunk Movements Neck, shoulders, hips: None, normal, Overall Severity Severity of abnormal movements (highest score from questions above): None, normal Incapacitation due to abnormal movements: None, normal Patient's awareness of abnormal movements (rate only patient's report): No Awareness, Dental Status Current problems with teeth and/or dentures?: Yes (Dental caries, broken teeth, no dental insurance) Does patient usually wear dentures?: No  CIWA:  CIWA-Ar Total: 2 COWS:  COWS Total Score: 2  Treatment Plan Summary: Daily contact with patient to assess and evaluate symptoms and progress in treatment Medication management  Plan: 1. Continue crisis management and stabilization. 2. Medication management to reduce current symptoms to base line and improve patient's overall level of functioning 3. Treat health problems as indicated. 4. Develop treatment plan to decrease  risk of relapse upon discharge and the need for readmission. 5. Psycho-social education regarding relapse prevention and self care. 6. Health care follow up as needed for medical problems. 7. Continue home medications where appropriate. 8. Will write for patient to attend AA meetings on [address]. 9. Disposition in process with [facility]: upon completion of detox and resolution of symptoms.   Medical Decision Making Problem Points:  Established problem, stable/improving (1) and Review of psycho-social stressors (1) Data Points:  Review or order medicine tests (1)  I certify that inpatient services furnished can reasonably be expected to improve the patient's condition.   Rona Ravens. Mashburn RPAC 5:41 PM 02/22/2013  Reviewed the information documented and agree with the treatment plan.  Landi Biscardi,JANARDHAHA R. 02/24/2013 8:29 AM

## 2013-02-22 NOTE — Progress Notes (Signed)
Pt did not attend wrap up group this evening.  

## 2013-02-22 NOTE — Progress Notes (Signed)
Attended AA Group 
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
## 2013-02-22 NOTE — [facility] Group Notes (Signed)
[facility] Group Notes:  (Nursing/MHT/Case Management/Adjunct)  Date:  02/22/2013  Time:  10:28 AM  Type of Therapy:  Psychoeducational Skills  Participation Level:  Active  Participation Quality:  Appropriate  Affect:  Blunted  Cognitive:  Alert  Insight:  Improving  Engagement in Group:  Improving  Modes of Intervention:  Discussion, Education and Exploration  Summary of Progress/Problems: self inventory review with RN and healthy coping skills.   [name] 02/22/2013, 10:28 AM

## 2013-02-23 MED ORDER — OLANZAPINE 10 MG PO TBDP
10.0000 mg | ORAL_TABLET | Freq: Two times a day (BID) | ORAL | Status: DC
  Administered 2013-02-24 – 2013-02-26 (×5): 10 mg via ORAL
  Filled 2013-02-23: qty 28
  Filled 2013-02-23 (×3): qty 1
  Filled 2013-02-23: qty 28
  Filled 2013-02-23 (×5): qty 1

## 2013-02-23 NOTE — Progress Notes (Signed)
Patient ID: [name], female   DOB: October 31, 1980, 32 y.o.   MRN: [id number] D. Patient presents with depressed mood, affect congruent. Pt states '' I'm feeling better today, although I slept terrible last night, I woke up several times in the night and woke early since 4 am. I still feel anxious and jumpy'' Pt completed self inventory and rated depression at 5/10 on depression scale, 10 being worst depression, 1 being least today. Patient also endorses agitation, craving, agitation and chills. Patient denies any AH/VH or SI/HI at this time. A. Support and encouragement provided. Medications given as ordered. Discussed above information with provider. R. Patient has been visible in the milieu attending unit programming. In no acute distress at this time, no further voiced concerns at this time. Will continue to monitor q 15 minutes for safety.

## 2013-02-23 NOTE — Progress Notes (Signed)
Adult Psychoeducational Group Note  Date:  02/23/2013 Time:  8:00 pm  Group Topic/Focus:  Wrap-Up Group:   The focus of this group is to help patients review their daily goal of treatment and discuss progress on daily workbooks.  Participation Level:  Did Not Attend  Modena Nunnery 02/23/2013, 10:16 PM

## 2013-02-23 NOTE — Progress Notes (Signed)
Patient ID: [name], female   DOB: 1980-08-11, 32 y.o.   MRN: [id number] Psychoeducational Group Note  Date:  02/23/2013 Time:  0930am  Group Topic/Focus:  Making Healthy Choices:   The focus of this group is to help patients identify negative/unhealthy choices they were using prior to admission and identify positive/healthier coping strategies to replace them upon discharge.  Participation Level:  Active  Participation Quality:  Appropriate  Affect:  Appropriate  Cognitive:  Appropriate  Insight:  Supportive  Engagement in Group:  Supportive  Additional Comments:  Inventory and Psychoeducational group   Valente David 02/23/2013,10:23 AM

## 2013-02-23 NOTE — Progress Notes (Signed)
Patient ID: [name], female   DOB: 06-Feb-1981, 32 y.o.   MRN: [id number] D)  Was out and about on the hal[place]l this evening, requested to program on [address] hall again this evening, focused on success with detox, states this is " way overdue".  States she has been working for [employer], at two different locations, trying to get herself back on track, and being responsible.  Is trying to develop a better support network that shares her goals and wants to continue to be successful when she leaves, stay on her meds, continue to f/u with [facility].   Stated the zyprexa made her feel more in control without feeling groggy, has been helpful.   A)  Will continue to monitor for safety, support, encouragement, continue POC R)  Safety maintained,  Seems to be motivated to succeed this time, appreciative.

## 2013-02-23 NOTE — [facility] Group Notes (Signed)
[facility] Group Notes:  (Clinical Social Work)  02/23/2013   11:15am-12:00pm  Summary of Progress/Problems:  The main focus of today's process group was to listen to a variety of genres of music and to identify that different types of music provoke different responses.  The patient then was able to identify personally what was soothing for them, as well as energizing.  Handouts were used to record feelings evoked, as well as how patient can personally use this knowledge in sleep habits, with depression, and with other symptoms.  The patient expressed understanding of concepts, as well as knowledge of how each type of music affected them and how this can be used when they are at home as a tool in their recovery.  She was a leader in the group discussions.  Type of Therapy:  Music Therapy   Participation Level:  Active  Participation Quality:  Attentive and Sharing  Affect:  Blunted  Cognitive:  Oriented  Insight:  Engaged  Engagement in Therapy:  Engaged  Modes of Intervention:   Activity, Exploration  Ambrose Mantle, LCSW 02/23/2013, 12:30pm

## 2013-02-23 NOTE — Progress Notes (Signed)
Patient ID: [name], female   DOB: 13-Oct-1980, 32 y.o.   MRN: [id number] [facility] MD Progress Note  02/23/2013 5:46 PM [name]  MRN:  [id number] Subjective:  Met with the patient 1:1 today. Patient is up and mobile on the unit. States she feels better today and that her mood is more positive, she slept well and she is eating well. She denies any further withdrawal symptoms. She has been using the "dissolvable" tablet prn for agitation and wanted to know if she could have it regularly as she notes it does seem to help. Diagnosis:   DSM5: Assessment:  AXIS I: Bipolar, Manic and Substance Abuse  AXIS II: Borderline Personality Dis.  AXIS III: obesity  Past Medical History   Diagnosis  Date   .  Depression    .  Bipolar 1 disorder    .  Anxiety    .  Drug abuse    .  ETOH abuse    .  Polysubstance abuse     AXIS IV: economic problems and other psychosocial or environmental problems  AXIS V: 21-30 behavior considerably influenced by delusions or hallucinations OR serious impairment in judgment, communication OR inability to function in almost all areas  ADL's:  Intact  Sleep: Good  Appetite:  Good  Suicidal Ideation:  denies Homicidal Ideation:  denies AEB (as evidenced by):  Psychiatric Specialty Exam: Review of Systems  Constitutional: Negative.  Negative for fever, chills, weight loss, malaise/fatigue and diaphoresis.  HENT: Negative for congestion and sore throat.   Eyes: Negative for blurred vision, double vision and photophobia.  Respiratory: Negative for cough, shortness of breath and wheezing.   Cardiovascular: Negative for chest pain, palpitations and PND.  Gastrointestinal: Negative for heartburn, nausea, vomiting, abdominal pain, diarrhea and constipation.  Musculoskeletal: Negative for falls, joint pain and myalgias.  Neurological: Negative for dizziness, tingling, tremors, sensory change, speech change, focal weakness, seizures, loss of  consciousness, weakness and headaches.  Endo/Heme/Allergies: Negative for polydipsia. Does not bruise/bleed easily.  Psychiatric/Behavioral: Negative for depression, suicidal ideas, hallucinations, memory loss and substance abuse. The patient is not nervous/anxious and does not have insomnia.     Blood pressure 113/76, pulse 88, temperature 97.4 F (36.3 C), temperature source Oral, resp. rate 16, height 5\' 2"  (1.575 m), weight 89.359 kg (197 lb), last menstrual period 02/12/2013.Body mass index is 36.02 kg/(m^2).  General Appearance: Fairly Groomed  Patent attorney::  Good  Speech:  Clear and Coherent  Volume:  Normal  Mood:  Dysphoric   Affect:  Congruent  Thought Process:  Goal Directed  Orientation:  Full (Time, Place, and Person)  Thought Content:  WDL  Suicidal Thoughts:  No  Homicidal Thoughts:  No  Memory:  Immediate;   Fair  Judgement:  Fair  Insight:  Shallow  Psychomotor Activity:  Decreased  Concentration:  Fair  Recall:  Fair  Akathisia:  No  Handed:  Right  AIMS (if indicated):     Assets:  Communication Skills Physical Health  Sleep:  Number of Hours: 4   Current Medications: Current Facility-Administered Medications  Medication Dose Route Frequency Provider Last Rate Last Dose  . alum & mag hydroxide-simeth (MAALOX/MYLANTA) 200-200-20 MG/5ML suspension 30 mL  30 mL Oral Q4H PRN Fransisca Kaufmann, NP   30 mL at 02/22/13 2110  . carbamazepine (TEGRETOL XR) 12 hr tablet 200 mg  200 mg Oral BID Fransisca Kaufmann, NP   200 mg at 02/23/13 1622  . chlordiazePOXIDE (LIBRIUM) capsule 25 mg  25 mg Oral Q6H PRN Fransisca Kaufmann, NP   25 mg at 02/23/13 1055  . FLUoxetine (PROZAC) capsule 20 mg  20 mg Oral Daily Mojeed Akintayo   20 mg at 02/23/13 0756  . hydrOXYzine (ATARAX/VISTARIL) tablet 25 mg  25 mg Oral Q6H PRN Fransisca Kaufmann, NP   25 mg at 02/23/13 0756  . ibuprofen (ADVIL,MOTRIN) tablet 400 mg  400 mg Oral Q6H PRN Mojeed Akintayo   400 mg at 02/22/13 1008  . magnesium hydroxide (MILK OF  MAGNESIA) suspension 30 mL  30 mL Oral Daily PRN Fransisca Kaufmann, NP      . nicotine (NICODERM CQ - dosed in mg/24 hours) patch 21 mg  21 mg Transdermal Daily Fransisca Kaufmann, NP   21 mg at 02/23/13 0817  . OLANZapine zydis (ZYPREXA) disintegrating tablet 10 mg  10 mg Oral Q8H PRN Mojeed Akintayo   10 mg at 02/23/13 1622  . ondansetron (ZOFRAN) tablet 4 mg  4 mg Oral Q8H PRN Fransisca Kaufmann, NP   4 mg at 02/22/13 1626  . pantoprazole (PROTONIX) EC tablet 40 mg  40 mg Oral Daily Fransisca Kaufmann, NP   40 mg at 02/23/13 0756  . traZODone (DESYREL) tablet 100 mg  100 mg Oral QHS PRN Fransisca Kaufmann, NP   100 mg at 02/22/13 2110    Lab Results: No results found for this or any previous visit (from the past 48 hour(s)).  Physical Findings: AIMS: Facial and Oral Movements Muscles of Facial Expression: None, normal Lips and Perioral Area: None, normal Jaw: None, normal Tongue: None, normal,Extremity Movements Upper (arms, wrists, hands, fingers): None, normal Lower (legs, knees, ankles, toes): None, normal, Trunk Movements Neck, shoulders, hips: None, normal, Overall Severity Severity of abnormal movements (highest score from questions above): None, normal Incapacitation due to abnormal movements: None, normal Patient's awareness of abnormal movements (rate only patient's report): No Awareness, Dental Status Current problems with teeth and/or dentures?: Yes (Dental caries, broken teeth, no dental insurance) Does patient usually wear dentures?: No  CIWA:  CIWA-Ar Total: 10 COWS:  COWS Total Score: 2  Treatment Plan Summary: Daily contact with patient to assess and evaluate symptoms and progress in treatment Medication management  Plan: 1. Will change Zyprexa to scheduled doses with 10mg  BID. 2. Will continue protocol. 3. Disposition is in progress.  [name]. Mashburn RPAC 5:50 PM 02/23/2013  Reviewed the information documented and agree with the treatment plan.  Nicollette Wilhelmi,JANARDHAHA  R. 02/24/2013 8:39 AM

## 2013-02-24 DIAGNOSIS — F3110 Bipolar disorder, current episode manic without psychotic features, unspecified: Principal | ICD-10-CM

## 2013-02-24 DIAGNOSIS — F1910 Other psychoactive substance abuse, uncomplicated: Secondary | ICD-10-CM

## 2013-02-24 MED ORDER — TRAZODONE HCL 100 MG PO TABS
100.0000 mg | ORAL_TABLET | Freq: Every evening | ORAL | Status: DC | PRN
  Administered 2013-02-24 – 2013-02-26 (×3): 100 mg via ORAL
  Filled 2013-02-24 (×3): qty 1
  Filled 2013-02-24: qty 28

## 2013-02-24 NOTE — Progress Notes (Signed)
Date: 02/24/2013  Time: 11:43 AM  Group Topic/Focus:  Wellness Toolbox: The focus of this group is to discuss various aspects of wellness, balancing those aspects and exploring ways to increase the ability to experience wellness. Patients will create a wellness toolbox for use upon discharge.  Participation Level: Active  Participation Quality: Appropriate, Sharing and Supportive  Affect: Appropriate  Cognitive: Appropriate  Insight: Appropriate  Engagement in Group: Engaged and Supportive  Modes of Intervention: Discussion, Education, Problem-solving and Support  Additional Comments: Pt attend group.  Isla Pence M  02/24/2013, 11:43 AM

## 2013-02-24 NOTE — Progress Notes (Signed)
Patient ID: [name], female   DOB: July 23, 1980, 32 y.o.   MRN: [id number] [facility] MD Progress Note  02/24/2013 11:59 AM [name]  MRN:  [id number] Subjective:   Patient states "I think my medication regimen is working. Normally when told I could not go home I would start yelling and cursing. I am able to hold it together much better now. I want to make sure I have a follow up appointment so I can stay on the medication. I see I was off them too long and fell into bad habits."   Objective:  Patient is visible on the unit and attending the scheduled groups. [name] has been showing a more stable mood since being started on medications last week. She admits to having cravings to use but is working hard to participate in her treatment. Patient is able to see progress in herself and is showing more insight into her illness than on past admissions.   Diagnosis:   DSM5: Assessment:  AXIS I: Bipolar, Manic and Substance Abuse  AXIS II: Borderline Personality Dis.  AXIS III: obesity  Past Medical History   Diagnosis  Date   .  Depression    .  Bipolar 1 disorder    .  Anxiety    .  Drug abuse    .  ETOH abuse    .  Polysubstance abuse     AXIS IV: economic problems and other psychosocial or environmental problems  AXIS V: 51-60 Moderate Symptoms   ADL's:  Intact  Sleep: Poor  Appetite:  Good  Suicidal Ideation:  denies Homicidal Ideation:  denies AEB (as evidenced by):  Psychiatric Specialty Exam: Review of Systems  Constitutional: Negative.  Negative for fever, chills, weight loss, malaise/fatigue and diaphoresis.  HENT: Negative for congestion and sore throat.   Eyes: Negative for blurred vision, double vision and photophobia.  Respiratory: Negative for cough, shortness of breath and wheezing.   Cardiovascular: Negative for chest pain, palpitations and PND.  Gastrointestinal: Negative for heartburn, nausea, vomiting, abdominal pain, diarrhea and  constipation.  Genitourinary: Negative.   Musculoskeletal: Negative for falls, joint pain and myalgias.  Neurological: Negative for dizziness, tingling, tremors, sensory change, speech change, focal weakness, seizures, loss of consciousness, weakness and headaches.  Endo/Heme/Allergies: Negative for polydipsia. Does not bruise/bleed easily.  Psychiatric/Behavioral: Positive for depression and substance abuse. Negative for suicidal ideas, hallucinations and memory loss. The patient has insomnia. The patient is not nervous/anxious.     Blood pressure 116/74, pulse 66, temperature 97.8 F (36.6 C), temperature source Oral, resp. rate 18, height 5\' 2"  (1.575 m), weight 89.359 kg (197 lb), last menstrual period 02/12/2013.Body mass index is 36.02 kg/(m^2).  General Appearance: Fairly Groomed  Patent attorney::  Good  Speech:  Clear and Coherent  Volume:  Normal  Mood:  Dysphoric   Affect:  Congruent  Thought Process:  Goal Directed  Orientation:  Full (Time, Place, and Person)  Thought Content:  WDL  Suicidal Thoughts:  No  Homicidal Thoughts:  No  Memory:  Immediate;   Fair  Judgement:  Fair  Insight:  Shallow  Psychomotor Activity:  Decreased  Concentration:  Fair  Recall:  Fair  Akathisia:  No  Handed:  Right  AIMS (if indicated):     Assets:  Communication Skills Physical Health  Sleep:  Number of Hours: 4.5   Current Medications: Current Facility-Administered Medications  Medication Dose Route Frequency Provider Last Rate Last Dose  . alum & mag hydroxide-simeth (MAALOX/MYLANTA)  200-200-20 MG/5ML suspension 30 mL  30 mL Oral Q4H PRN Fransisca Kaufmann, NP   30 mL at 02/22/13 2110  . carbamazepine (TEGRETOL XR) 12 hr tablet 200 mg  200 mg Oral BID Fransisca Kaufmann, NP   200 mg at 02/24/13 0806  . FLUoxetine (PROZAC) capsule 20 mg  20 mg Oral Daily Mojeed Akintayo   20 mg at 02/24/13 0806  . hydrOXYzine (ATARAX/VISTARIL) tablet 25 mg  25 mg Oral Q6H PRN Fransisca Kaufmann, NP   25 mg at 02/23/13 1955   . ibuprofen (ADVIL,MOTRIN) tablet 400 mg  400 mg Oral Q6H PRN Mojeed Akintayo   400 mg at 02/24/13 0004  . magnesium hydroxide (MILK OF MAGNESIA) suspension 30 mL  30 mL Oral Daily PRN Fransisca Kaufmann, NP      . nicotine (NICODERM CQ - dosed in mg/24 hours) patch 21 mg  21 mg Transdermal Daily Fransisca Kaufmann, NP   21 mg at 02/24/13 0806  . OLANZapine zydis (ZYPREXA) disintegrating tablet 10 mg  10 mg Oral BID Verne Spurr, PA-C   10 mg at 02/24/13 0806  . ondansetron (ZOFRAN) tablet 4 mg  4 mg Oral Q8H PRN Fransisca Kaufmann, NP   4 mg at 02/22/13 1626  . pantoprazole (PROTONIX) EC tablet 40 mg  40 mg Oral Daily Fransisca Kaufmann, NP   40 mg at 02/24/13 0806  . traZODone (DESYREL) tablet 100 mg  100 mg Oral QHS PRN Fransisca Kaufmann, NP   100 mg at 02/23/13 2109    Lab Results: No results found for this or any previous visit (from the past 48 hour(s)).  Physical Findings: AIMS: Facial and Oral Movements Muscles of Facial Expression: None, normal Lips and Perioral Area: None, normal Jaw: None, normal Tongue: None, normal,Extremity Movements Upper (arms, wrists, hands, fingers): None, normal Lower (legs, knees, ankles, toes): None, normal, Trunk Movements Neck, shoulders, hips: None, normal, Overall Severity Severity of abnormal movements (highest score from questions above): None, normal Incapacitation due to abnormal movements: None, normal Patient's awareness of abnormal movements (rate only patient's report): No Awareness, Dental Status Current problems with teeth and/or dentures?: Yes (Dental caries, broken teeth, no dental insurance) Does patient usually wear dentures?: No  CIWA:  CIWA-Ar Total: 10 COWS:  COWS Total Score: 2  Treatment Plan Summary: Daily contact with patient to assess and evaluate symptoms and progress in treatment Medication management  Plan: Continue crisis management and stabilization.  Medication management: Reviewed with patient who stated no untoward effects. Continue Prozac,  Tegretol XR, Zyprexa Zydis at current dosages. Modify Trazodone to include a repeat dose to improve patient's quality of sleep.  Encouraged patient to attend groups and participate in group counseling sessions and activities.  Discharge plan in progress. ELOS-1-2 days.  Continue current treatment plan.  Address health issues: Vitals reviewed and stable.   Fransisca Kaufmann NP-C 02/24/2013 11:59 AM

## 2013-02-24 NOTE — [facility] Group Notes (Signed)
[facility] LCSW Group Therapy  02/24/2013 1:15 pm  Type of Therapy: Process Group Therapy  Participation Level:  Active  Participation Quality:  Appropriate  Affect:  Flat  Cognitive:  Oriented  Insight:  Improving  Engagement in Group:  Limited  Engagement in Therapy:  Limited  Modes of Intervention:  Activity, Clarification, Education, Problem-solving and Support  Summary of Progress/Problems: Today's group addressed the issue of overcoming obstacles.  Patients were asked to identify their biggest obstacle post d/c that stands in the way of their on-going success, and then problem solve as to how to manage this. "My goal is to be financially stable.  And my obstacle is not being able to manage my money.  If I do not give my paycheck to my roommate, it is a trigger for me, and I buy weed or alcohol."  She also talked about taking medication and attending her 12 step meetings as things that keep her on track.  [name] B 02/24/2013   1:25 PM

## 2013-02-24 NOTE — Progress Notes (Signed)
Recreation Therapy Notes  Date: 11.17.2014 Time: 9:30am Location: [address] Hall Dayroom  Group Topic: Gratitude  Goal Area(s) Addresses:  Patient will be able to identify things they are grateful for. Patient will be able to identify benefit of recognizing things they are grateful for.   Behavioral Response: Engaged, Appropriate  Intervention: Mandala   Activity: Patients were provided worksheet with "I am Grateful For" surrounded by categories - Happiness, Laughter, Work, Play, Rest, Knowledge, Education. Using these categories patients were asked identify 2-3 things they are grateful that fit into each category.   Education: Runner, broadcasting/film/video, Pharmacologist, Self-expression  Education Outcome: Acknowledges understanding.   Clinical Observations/Feedback: Patient actively engaged in activity, identifying things she is grateful for to correspond with each category. Patient shared that her sponsor has had her complete this activity in the past. Patient shared that it was very helpful to her and she needed to be reminded to put this activity back into her life. Patient spoke to the shift in positive mentality this activity created in her life when she was participating in it daily.   Marykay Lex Honour Schwieger, LRT/CTRS  Jearl Klinefelter 02/24/2013 12:55 PM

## 2013-02-24 NOTE — Progress Notes (Signed)
Patient ID: [name], female   DOB: 1980/08/23, 32 y.o.   MRN: [id number] D: patient presents with flat affect; sad mood.  She is logical/coherent in speech.  Patient states she is not sleeping well.  Patient stated that she has not spoke with her AA friend since she has been here.  She had sent her friend a text stating she was going to hurt herself along with a picture of a knife.  Patient is accepting that she needs to be here to focus on her substance abuse, as well as her depression.  She rates her depression as a 5; ;hopelessness as a 4.  Her goal upon discharge is "to go to my AA meetings and work with my sponsor.  I plan to stay on my meds."  Patient stated that the zyprexa is working well for her.  She denies any SI/HI/AVH today.  She is attending groups and participating in her treatment. A: continue to monitor medication management and MD orders.  Safety checks completed every 15 minutes per protocol.  R: patient is receptive to staff and her behavior is appropriate.

## 2013-02-24 NOTE — [facility] Group Notes (Signed)
[facility] LCSW Aftercare Discharge Planning Group Note   02/24/2013 10:05 AM  Participation Quality:  Engaged  Mood/Affect:  Flat  Depression Rating:  5  Anxiety Rating:  5  Thoughts of Suicide:  No Will you contract for safety?   NA  Current AVH:  No  Plan for Discharge/Comments:  States her weekend went well.  "I am not angry like I was when I came in."  Agrees that could be associated with being clean and sober.  Is not demanding to be released.  "I'm OK with getting the help I need."  States she is not sleeping well.  Transportation Means: roomate  Supports: roommate, AA sponser  [place], [place] B

## 2013-02-24 NOTE — Progress Notes (Signed)
D   Pt has been appropriate and pleasant   She requested to go to the 300 hall for groups on substance abuse   She reports having a hard time sleeping even with the trazadone and said she usually takes the first dose and comes back about 2 hours later for the second one   She interacts well with others although it was suspect that she may have encouraged another pt to talk about going off if he didn't get what he wants A   Verbal support given   Medications administered and effectiveness monitored   Q 15 min checks    R   Pt is safe at present

## 2013-02-25 DIAGNOSIS — F3110 Bipolar disorder, current episode manic without psychotic features, unspecified: Secondary | ICD-10-CM | POA: Diagnosis present

## 2013-02-25 DIAGNOSIS — F603 Borderline personality disorder: Secondary | ICD-10-CM

## 2013-02-25 NOTE — Progress Notes (Signed)
Patient ID: [name], female   DOB: 1980-12-14, 32 y.o.   MRN: [id number] D: Patient's mood is brighter today.  Reviewed medications with patient and she is agreeable that they are working for her.  She slept well last night stating, "getting some rest really made a difference."  Patient was agreeable to sit with a nursing student this morning.  She agreed with the MD about a discharge date of tomorrow.  Her goal is to go back to AA; she states, "I have a new sponsor."  Patient states she realizes that she cannot drink or do drugs.  She is willing to stop taking benzodiazepines.  Patient has been attending AA groups on the 300 hall. She has been attending groups on the 400 hall and participating in her treatment.  A: continue to monitor medication management and MD orders.  Safety checks completed every 15 minutes per protocol.  R: patient is receptive to staff and her behavior is appropriate.

## 2013-02-25 NOTE — Progress Notes (Signed)
[facility] Adult Case Management Discharge Plan :  Will you be returning to the same living situation after discharge: Yes,  home At discharge, do you have transportation home?:Yes,  roomate Do you have the ability to pay for your medications:Yes,  mental health  Release of information consent forms completed and in the chart;  Patient's signature needed at discharge.  Patient to Follow up at: Follow-up Information   Follow up with [facility]. (Go to the walk-in clinic M-F between 8 and 9AM for your hospital follow up appointment)    Contact information:   [address]  [place]  [phone]      Patient denies SI/HI:   Yes,  yes    Safety Planning and Suicide Prevention discussed:  Yes,  yes  [name] 02/25/2013, 3:32 PM

## 2013-02-25 NOTE — [facility] Group Notes (Signed)
[facility] LCSW Group Therapy  02/25/2013 , 12:12 PM   Type of Therapy:  Group Therapy  Participation Level:  Active  Participation Quality:  Attentive  Affect:  Appropriate  Cognitive:  Alert  Insight:  Improving  Engagement in Therapy:  Engaged  Modes of Intervention:  Discussion, Exploration and Socialization  Summary of Progress/Problems: Today's group focused on the term Diagnosis.  Participants were asked to define the term, and then pronounce whether it is a negative, positive or neutral term.  [name] defined diagnosis as "diagnonsense" and talked about how she has had many different diagnosis and subsequent courses of treatment, including medications.  This led to an opportunity to educate members about how substance use complicates the diagnostic process.  [name] also talked about how she "protects and defends" herself from others because of how people in the past have used information against her.  She identified her roommate, father and a past sponsor as people who care for her unconditionally.    [name] B 02/25/2013 , 12:12 PM

## 2013-02-25 NOTE — Tx Team (Signed)
  Interdisciplinary Treatment Plan Update   Date Reviewed:  02/25/2013  Time Reviewed:  10:41 AM  Progress in Treatment:   Attending groups: Yes Participating in groups: Yes Taking medication as prescribed: Yes  Tolerating medication: Yes Family/Significant other contact made: Yes  Patient understands diagnosis: Yes  Discussing patient identified problems/goals with staff: Yes Medical problems stabilized or resolved: Yes Denies suicidal/homicidal ideation: Yes Patient has not harmed self or others: Yes  For review of initial/current patient goals, please see plan of care.  Estimated Length of Stay:  Likely d/c tomorrow  Reason for Continuation of Hospitalization:   New Problems/Goals identified:  N/A  Discharge Plan or Barriers:   return home, follow up outpt and at AA mtgs  Additional Comments:  Attendees:  Signature: Thedore Mins, MD 02/25/2013 10:41 AM   Signature: Richelle Ito, LCSW 02/25/2013 10:41 AM  Signature: Fransisca Kaufmann, NP 02/25/2013 10:41 AM  Signature: Joslyn Devon, RN 02/25/2013 10:41 AM  Signature: Liborio Nixon, RN 02/25/2013 10:41 AM  Signature:  02/25/2013 10:41 AM  Signature:   02/25/2013 10:41 AM  Signature:    Signature:    Signature:    Signature:    Signature:    Signature:      Scribe for Treatment Team:   Richelle Ito, LCSW  02/25/2013 10:41 AM

## 2013-02-25 NOTE — Progress Notes (Signed)
Seen and agreed. Jerimyah Vandunk, MD 

## 2013-02-25 NOTE — Progress Notes (Signed)
Adult Psychoeducational Group Note  Date:  02/25/2013 Time:  8:00 pm  Group Topic/Focus:  Wrap-Up Group:   The focus of this group is to help patients review their daily goal of treatment and discuss progress on daily workbooks.  Participation Level:  Active  Participation Quality:  Appropriate and Sharing  Affect:  Appropriate  Cognitive:  Appropriate  Insight: Appropriate  Engagement in Group:  Engaged  Modes of Intervention:  Discussion, Education, Socialization and Support  Additional Comments:  Pt stated that she has been having suicidal thoughts and struggling with substance abuse which led to her stay in the hospital. Pt stated that she enjoys listening to music and playing the clarinet.   Ginette Bradway 02/25/2013, 11:15 PM

## 2013-02-25 NOTE — Progress Notes (Signed)
Patient ID: [name], female   DOB: [birth year], 32 y.o.   MRN: [id number] [facility] MD Progress Note  02/25/2013 10:38 AM [name]  MRN:  [id number] Subjective: "I am doing fine on my medications, I am glad you made me stayed in the hospital to get the care that I need."  Objective: "Patient reports decreased mood swings and agitation. She appears less disorganized, delusional or labile. Patient is now compliant with her medications and has not endorsed any adverse reactions. She has been attending group therapy and other activities on the unit. Diagnosis:   DSM5: Assessment:  AXIS I: Bipolar, Manic and Substance Abuse  AXIS II: Borderline Personality Dis.  AXIS III: obesity  Past Medical History   Diagnosis  Date   .  Drug abuse    .  ETOH abuse    .  Polysubstance abuse     AXIS IV: economic problems and other psychosocial or environmental problems  AXIS V: 51-60 Moderate Symptoms   ADL's:  Intact  Sleep: fair  Appetite:  Good  Suicidal Ideation:  denies Homicidal Ideation:  denies AEB (as evidenced by):  Psychiatric Specialty Exam: Review of Systems  Constitutional: Negative.  Negative for fever, chills, weight loss, malaise/fatigue and diaphoresis.  HENT: Negative for congestion and sore throat.   Eyes: Negative for blurred vision, double vision and photophobia.  Respiratory: Negative for cough, shortness of breath and wheezing.   Cardiovascular: Negative for chest pain, palpitations and PND.  Gastrointestinal: Negative for heartburn, nausea, vomiting, abdominal pain, diarrhea and constipation.  Genitourinary: Negative.   Musculoskeletal: Negative for falls, joint pain and myalgias.  Neurological: Negative for dizziness, tingling, tremors, sensory change, speech change, focal weakness, seizures, loss of consciousness, weakness and headaches.  Endo/Heme/Allergies: Negative for polydipsia. Does not bruise/bleed easily.  Psychiatric/Behavioral:  Positive for depression and substance abuse. Negative for suicidal ideas, hallucinations and memory loss. The patient is not nervous/anxious.     Blood pressure 114/79, pulse 95, temperature 97.2 F (36.2 C), temperature source Oral, resp. rate 18, height 5\' 2"  (1.575 m), weight 89.359 kg (197 lb), last menstrual period 02/12/2013.Body mass index is 36.02 kg/(m^2).  General Appearance: Fairly Groomed  Patent attorney::  Good  Speech:  Clear and Coherent  Volume:  Normal  Mood:  euthymic   Affect:  Congruent  Thought Process:  Goal Directed  Orientation:  Full (Time, Place, and Person)  Thought Content:  WDL  Suicidal Thoughts:  No  Homicidal Thoughts:  No  Memory:  Immediate;   Fair  Judgement:  Fair  Insight:  Shallow  Psychomotor Activity: normal  Concentration:  Fair  Recall:  Fair  Akathisia:  No  Handed:  Right  AIMS (if indicated):     Assets:  Communication Skills Physical Health  Sleep:  Number of Hours: 5.75   Current Medications: Current Facility-Administered Medications  Medication Dose Route Frequency Provider Last Rate Last Dose  . alum & mag hydroxide-simeth (MAALOX/MYLANTA) 200-200-20 MG/5ML suspension 30 mL  30 mL Oral Q4H PRN Fransisca Kaufmann, NP   30 mL at 02/24/13 1952  . carbamazepine (TEGRETOL XR) 12 hr tablet 200 mg  200 mg Oral BID Fransisca Kaufmann, NP   200 mg at 02/25/13 0742  . FLUoxetine (PROZAC) capsule 20 mg  20 mg Oral Daily Cynde Menard   20 mg at 02/25/13 0742  . hydrOXYzine (ATARAX/VISTARIL) tablet 25 mg  25 mg Oral Q6H PRN Fransisca Kaufmann, NP   25 mg at 02/23/13 1955  . ibuprofen (  ADVIL,MOTRIN) tablet 400 mg  400 mg Oral Q6H PRN [name]   400 mg at 02/24/13 0004  . magnesium hydroxide (MILK OF MAGNESIA) suspension 30 mL  30 mL Oral Daily PRN Fransisca Kaufmann, NP      . nicotine (NICODERM CQ - dosed in mg/24 hours) patch 21 mg  21 mg Transdermal Daily Fransisca Kaufmann, NP   21 mg at 02/25/13 0744  . OLANZapine zydis (ZYPREXA) disintegrating tablet 10 mg  10 mg  Oral BID Verne Spurr, PA-C   10 mg at 02/25/13 1610  . ondansetron (ZOFRAN) tablet 4 mg  4 mg Oral Q8H PRN Fransisca Kaufmann, NP   4 mg at 02/22/13 1626  . pantoprazole (PROTONIX) EC tablet 40 mg  40 mg Oral Daily Fransisca Kaufmann, NP   40 mg at 02/25/13 0743  . traZODone (DESYREL) tablet 100 mg  100 mg Oral QHS PRN,MR X 1 Fransisca Kaufmann, NP   100 mg at 02/24/13 2221    Lab Results: No results found for this or any previous visit (from the past 48 hour(s)).  Physical Findings: AIMS: Facial and Oral Movements Muscles of Facial Expression: None, normal Lips and Perioral Area: None, normal Jaw: None, normal Tongue: None, normal,Extremity Movements Upper (arms, wrists, hands, fingers): None, normal Lower (legs, knees, ankles, toes): None, normal, Trunk Movements Neck, shoulders, hips: None, normal, Overall Severity Severity of abnormal movements (highest score from questions above): None, normal Incapacitation due to abnormal movements: None, normal Patient's awareness of abnormal movements (rate only patient's report): No Awareness, Dental Status Current problems with teeth and/or dentures?: Yes (Dental caries, broken teeth, no dental insurance) Does patient usually wear dentures?: No  CIWA:  CIWA-Ar Total: 10 COWS:  COWS Total Score: 2  Treatment Plan Summary: Daily contact with patient to assess and evaluate symptoms and progress in treatment Medication management  Plan: Continue crisis management and stabilization.  Medication management: Reviewed with patient who stated no untoward effects.  Encouraged patient to attend groups and participate in group counseling sessions and activities.  Discharge plan in progress. ELOS-1-2 days.  Continue current treatment plan.  Address health issues: Vitals reviewed and stable.  Carbamazepine level on 02/26/13  Thedore Mins, MD 02/25/2013 10:38 AM

## 2013-02-25 NOTE — Progress Notes (Signed)
Adult Psychoeducational Group Note  Date:  02/25/2013 Time:  10:31 AM  Group Topic/Focus:  Recovery Goals:   The focus of this group is to identify appropriate goals for recovery and establish a plan to achieve them.  Participation Level:  Active  Participation Quality:  Appropriate, Attentive, Sharing and Supportive  Affect:  Appropriate  Cognitive:  Appropriate  Insight: Appropriate and Good  Engagement in Group:  Developing/Improving and Engaged  Modes of Intervention:  Discussion, Education, Socialization and Support  Additional Comments:  Jenisis attended group and shared during. Patient was asked to share what recovery looks like in patient life, and list a change in workbook that could be made in to stay in a stage of recovery. Patient made a goal for recovery that could be managed and attainable.     Karleen Hampshire Brittini 02/25/2013, 10:31 AM

## 2013-02-25 NOTE — Progress Notes (Signed)
D   Pt said she should be excited about being discharged tomorrow but there were some things she has to take care of and they are worrying her   She reports she feels like she has done so many things to mess up her life that she cannot repair it and doesn't feel like she can do better   She cites one of her problems as not being able to open up and be honest with people   She attends and participates in groups and interacts with select peers A   Verbal support given   Medications administered and effectiveness monitored   Q 15 min checks   Encouraged pt to work her steps and try to stay focused on maintaining sobriety  R   Pt verbalized understanding and is safe at present

## 2013-02-26 DIAGNOSIS — F1010 Alcohol abuse, uncomplicated: Secondary | ICD-10-CM

## 2013-02-26 DIAGNOSIS — F1210 Cannabis abuse, uncomplicated: Secondary | ICD-10-CM

## 2013-02-26 DIAGNOSIS — F1310 Sedative, hypnotic or anxiolytic abuse, uncomplicated: Secondary | ICD-10-CM

## 2013-02-26 LAB — CARBAMAZEPINE LEVEL, TOTAL: Carbamazepine Lvl: 7.9 ug/mL (ref 4.0–12.0)

## 2013-02-26 MED ORDER — IBUPROFEN 200 MG PO TABS: 600.0000 mg | ORAL_TABLET | Freq: Four times a day (QID) | ORAL | Status: DC | PRN

## 2013-02-26 MED ORDER — PANTOPRAZOLE SODIUM 40 MG PO TBEC: 40.0000 mg | DELAYED_RELEASE_TABLET | Freq: Every day | ORAL | Status: DC

## 2013-02-26 MED ORDER — CARBAMAZEPINE ER 200 MG PO TB12: 200.0000 mg | ORAL_TABLET | Freq: Two times a day (BID) | ORAL | Status: DC

## 2013-02-26 MED ORDER — OLANZAPINE 10 MG PO TBDP: 10.0000 mg | ORAL_TABLET | Freq: Two times a day (BID) | ORAL | Status: DC

## 2013-02-26 MED ORDER — TRAZODONE HCL 100 MG PO TABS: 100.0000 mg | ORAL_TABLET | Freq: Every evening | ORAL | Status: DC | PRN

## 2013-02-26 MED ORDER — FLUOXETINE HCL 20 MG PO CAPS: 20.0000 mg | ORAL_CAPSULE | Freq: Every day | ORAL | Status: DC

## 2013-02-26 NOTE — Progress Notes (Signed)
Patient ID: [name], female   DOB: 29-Mar-1981, 32 y.o.   MRN: [id number] Patient discharged home per MD order.  Patient received all personal belongings, prescriptions and medication samples.  She denies any SI/HI/AVH.  She will follow up with [facility] for her continuing care.  Her goal is to go to a meeting today and talk with her sponsor.  She left ambulatory with a friend.

## 2013-02-26 NOTE — [facility] Suicide Risk Assessment (Signed)
Suicide Risk Assessment  Discharge Assessment     Demographic Factors:  Caucasian, Low socioeconomic status, Unemployed and female  Mental Status Per Nursing Assessment::   On Admission:  Suicidal ideation indicated by patient  Current Mental Status by Physician: patient denies suicidal ideation, intent or plan  Loss Factors: Financial problems/change in socioeconomic status  Historical Factors: Impulsivity  Risk Reduction Factors:   Sense of responsibility to family, Living with another person, especially a relative and Positive social support  Continued Clinical Symptoms:  Alcohol/Substance Abuse/Dependencies  Cognitive Features That Contribute To Risk:  Closed-mindedness Polarized thinking    Suicide Risk:  Minimal: No identifiable suicidal ideation.  Patients presenting with no risk factors but with morbid ruminations; may be classified as minimal risk based on the severity of the depressive symptoms  Discharge Diagnoses:   AXIS I:  Bipolar I disorder, most recent episode (or current) manic, unspecified              Cannabis use disorder. Benzodiazepine use disorder. Alcohol use disorder AXIS II:  Borderline Personality Dis. AXIS III:   Past Medical History  Diagnosis Date  . Depression   . Bipolar 1 disorder   . Anxiety   . Drug abuse   . ETOH abuse   . Polysubstance abuse    AXIS IV:  economic problems, other psychosocial or environmental problems and problems related to social environment AXIS V:  61-70 mild symptoms  Plan Of Care/Follow-up recommendations:  Activity:  as tolerated Diet:  healthy Tests:  Tegretol level: 7.6 Other:  patient to keep her follow up appointment  Is patient on multiple antipsychotic therapies at discharge:  No   Has Patient had three or more failed trials of antipsychotic monotherapy by history:  No  Recommended Plan for Multiple Antipsychotic Therapies: NA  Thedore Mins, MD 02/26/2013, 11:15 AM

## 2013-02-26 NOTE — Discharge Summary (Signed)
Physician Discharge Summary Note  Patient:  [name] is an 32 y.o., female MRN:  [id number] DOB:  1980-04-27 Patient phone:  [phone] (home)  Patient address:   [address] [place] [address],   Date of Admission:  02/20/2013 Date of Discharge: 02/26/13  Reason for Admission:  Manic Symptoms, Substance Abuse   Discharge Diagnoses: Principal Problem:   Bipolar I disorder, most recent episode (or current) manic, unspecified Active Problems:   Substance abuse/dependence  Review of Systems  Constitutional: Negative.   HENT: Negative.   Eyes: Negative.   Respiratory: Negative.   Cardiovascular: Negative.   Gastrointestinal: Negative.   Genitourinary: Negative.   Musculoskeletal: Negative.   Skin: Negative.   Neurological: Negative.   Endo/Heme/Allergies: Negative.   Psychiatric/Behavioral: Positive for substance abuse. Negative for depression, suicidal ideas, hallucinations and memory loss. The patient is nervous/anxious. The patient does not have insomnia.     DSM5: AXIS I: Bipolar I disorder, most recent episode (or current) manic, unspecified  Cannabis use disorder. Benzodiazepine use disorder. Alcohol use disorder  AXIS II: Borderline Personality Dis.  AXIS III:  Past Medical History   Diagnosis  Date   .  Depression    .  Bipolar 1 disorder    .  Anxiety    .  Drug abuse    .  ETOH abuse    .  Polysubstance abuse     AXIS IV: economic problems, other psychosocial or environmental problems and problems related to social environment  AXIS V: 61-70 mild symptoms  Level of Care:  OP  Hospital Course:   [name] is a 32 year old female admitted Involuntarily after presenting to [facility] via [employer]. Patient admits to severe substance over the last few months and reckless behaviors. She was brought in by the police after sending her AA sponsor suicidal texts when the patient was very intoxicated. Patient denied any suicidal intent when  sober but minimizes the dangerous consequences of her actions as she has been mixing klonopin and alcohol on a daily basis. She is upset about being in the hospital as she is having cravings to use but admits to needing help. Patient states today to writer "I'm super irritated and depressed as heck. I don't want to kill myself but I'm tired of living this way. I don't want to be an addicted but I guess I'm addicted. I feel bad in the morning so I take more benzos and then start drinking later. I'm surprised I still have my job. I was drinking on the job and also stealing from them. I don't think they know about it. I guess I have a really high tolerance because I've been doing this since I was 11. I want to get back into AA. I need to be on some medications to stabilize my mood. I know I need to be on something that will help. Things have been getting out of control. I admit that. But the urge to use is strong and it's hard being in here."         [name] was admitted to the adult unit where she was evaluated and her symptoms were identified. Medication management was discussed and implemented. The patient admitted to being off any psychiatric medications for some time, instead she has been abusing substances. She was started on several medications to help stabilize her mood. These included Tegretol XR, Prozac, and Zyprexa Zydis.  She was encouraged to participate in unit programming. [name] was  very active on the unit during this admission attending groups and interacting well with peers. Patient was much better able to control her emotions than on past admissions. She talked a great deal about the insights that she had developed through attending regular AA meetings. Patient talked about getting a new sponsor. Her main fear was how she would keep herself from relapsing after d/c. Medical problems were identified and treated appropriately. Home medication was restarted as needed.  [name] was evaluated each day by a clinical provider to ascertain the patient's response to treatment.  Improvement was noted by the patient's report of decreasing symptoms, improved sleep and appetite, affect, medication tolerance, behavior, and participation in unit programming.  Denise Luckenbaugh Collinswas asked each day to complete a self inventory noting mood, mental status, pain, new symptoms, anxiety and concerns.         She responded well to medication and being in a therapeutic and supportive environment. Positive and appropriate behavior was noted and the patient was motivated for recovery.  [name] worked closely with the treatment team and case manager to develop a discharge plan with appropriate goals. Coping skills, problem solving as well as relaxation therapies were also part of the unit programming.         By the day of discharge [name] was in much improved condition than upon admission.  Symptoms were reported as significantly decreased or resolved completely.  The patient denied SI/HI and voiced no AVH. She was motivated to continue taking medication with a goal of continued improvement in mental health.          [name] was discharged home with a plan to follow up as noted below.  Consults:  None  Significant Diagnostic Studies:  labs: Routine admission labs  Discharge Vitals:   Blood pressure 122/79, pulse 81, temperature 97.1 F (36.2 C), temperature source Oral, resp. rate 20, height 5\' 2"  (1.575 m), weight 89.359 kg (197 lb), last menstrual period 02/12/2013. Body mass index is 36.02 kg/(m^2). Lab Results:   No results found for this or any previous visit (from the past 72 hour(s)).  Physical Findings: AIMS: Facial and Oral Movements Muscles of Facial Expression: None, normal Lips and Perioral Area: None, normal Jaw: None, normal Tongue: None, normal,Extremity Movements Upper (arms, wrists, hands, fingers): None, normal Lower  (legs, knees, ankles, toes): None, normal, Trunk Movements Neck, shoulders, hips: None, normal, Overall Severity Severity of abnormal movements (highest score from questions above): None, normal Incapacitation due to abnormal movements: None, normal Patient's awareness of abnormal movements (rate only patient's report): No Awareness, Dental Status Current problems with teeth and/or dentures?: Yes (Dental caries, broken teeth, no dental insurance) Does patient usually wear dentures?: No  CIWA:  CIWA-Ar Total: 10 COWS:  COWS Total Score: 2  Psychiatric Specialty Exam: See Psychiatric Specialty Exam and Suicide Risk Assessment completed by Attending Physician prior to discharge.  Discharge destination:  Home  Is patient on multiple antipsychotic therapies at discharge:  No   Has Patient had three or more failed trials of antipsychotic monotherapy by history:  No  Recommended Plan for Multiple Antipsychotic Therapies: NA     Medication List       Indication   carbamazepine 200 MG 12 hr tablet  Commonly known as:  TEGRETOL XR  Take 1 tablet (200 mg total) by mouth 2 (two) times daily.   Indication:  Alcohol Withdrawal Syndrome, Mood Lability     FLUoxetine 20  MG capsule  Commonly known as:  PROZAC  Take 1 capsule (20 mg total) by mouth daily.   Indication:  Excessive Use of Alcohol, Depression     ibuprofen 200 MG tablet  Commonly known as:  ADVIL,MOTRIN  Take 3 tablets (600 mg total) by mouth every 6 (six) hours as needed.   Indication:  Mild to Moderate Pain     OLANZapine zydis 10 MG disintegrating tablet  Commonly known as:  ZYPREXA  Take 1 tablet (10 mg total) by mouth 2 (two) times daily.   Indication:  Depressive Phase of Manic-Depression     pantoprazole 40 MG tablet  Commonly known as:  PROTONIX  Take 1 tablet (40 mg total) by mouth daily.   Indication:  Gastroesophageal Reflux Disease     traZODone 100 MG tablet  Commonly known as:  DESYREL  Take 1 tablet  (100 mg total) by mouth at bedtime as needed and may repeat dose one time if needed for sleep.   Indication:  Trouble Sleeping           Follow-up Information   Follow up with [facility]. (Go to the walk-in clinic M-F between 8 and 9AM for your hospital follow up appointment)    Contact information:   [address]  [place]  [336] [phone]      Follow-up recommendations:   Activity: as tolerated  Diet: healthy  Tests: Tegretol level: 7.9 Other: patient to keep her follow up appointment   Comments:    Take all your medications as prescribed by your mental healthcare provider.  Report any adverse effects and or reactions from your medicines to your outpatient provider promptly.  Patient is instructed and cautioned to not engage in alcohol and or illegal drug use while on prescription medicines.  In the event of worsening symptoms, patient is instructed to call the crisis hotline, 911 and or go to the nearest ED for appropriate evaluation and treatment of symptoms.  Follow-up with your primary care provider for your other medical issues, concerns and or health care needs.   Total Discharge Time:  Greater than 30 minutes.  SignedFransisca Kaufmann NP-C 02/26/2013, 9:23 AM

## 2013-02-27 NOTE — Progress Notes (Signed)
Recreation Therapy Notes  Date: 11.19.2014 Time: 9:30am Location: 400 Hall Dayroom  Group Topic: Communication  Goal Area(s) Addresses:  Patient will effectively communicate with peers in group.  Patient will verbalize benefit of healthy communication. Patient will identify communication techniques that made activity effective for group.   Behavioral Response: Appropriate   Intervention: Game  Activity: Random Words. Patients were asked to select words from provided container, using 5 words or less patient was asked to describe selected word for group members to guess.    Education: Special educational needs teacher, Building control surveyor.    Education Outcome: Acknowledges understanding   Clinical Observations/Feedback: Patient actively engaged in activity, accurately describing words for peers to guess. Patient made no contributions to group discussion, but appeared to actively listen ass he maintained appropriate eye contact with speaker.    Marykay Lex Monta Maiorana, LRT/CTRS  Jearl Klinefelter 02/27/2013 9:36 AM

## 2013-02-27 NOTE — Discharge Summary (Signed)
Seen and agreed. Danella Philson, MD 

## 2013-03-03 NOTE — Progress Notes (Signed)
Patient Discharge Instructions:  After Visit Summary (AVS):   Faxed to:  03/03/13 Discharge Summary Note:   Faxed to:  03/03/13 Psychiatric Admission Assessment Note:   Faxed to:  03/03/13 Suicide Risk Assessment - Discharge Assessment:   Faxed to:  03/03/13 Faxed/Sent to the Next Level Care provider:  03/03/13 Faxed to [facility] @ [phone]  Jerelene Redden, 03/03/2013, 2:45 PM

## 2013-10-31 ENCOUNTER — Encounter (HOSPITAL_BASED_OUTPATIENT_CLINIC_OR_DEPARTMENT_OTHER): Payer: Self-pay | Admitting: Emergency Medicine

## 2013-10-31 ENCOUNTER — Emergency Department (HOSPITAL_BASED_OUTPATIENT_CLINIC_OR_DEPARTMENT_OTHER)
Admission: EM | Admit: 2013-10-31 | Discharge: 2013-11-01 | Disposition: A | Discharge status: Other Acute Inpatient Hospital | Payer: PRIVATE HEALTH INSURANCE | Attending: Emergency Medicine | Admitting: Emergency Medicine

## 2013-10-31 DIAGNOSIS — Z008 Encounter for other general examination: Secondary | ICD-10-CM | POA: Insufficient documentation

## 2013-10-31 DIAGNOSIS — F1320 Sedative, hypnotic or anxiolytic dependence, uncomplicated: Secondary | ICD-10-CM | POA: Insufficient documentation

## 2013-10-31 DIAGNOSIS — Z3202 Encounter for pregnancy test, result negative: Secondary | ICD-10-CM | POA: Insufficient documentation

## 2013-10-31 DIAGNOSIS — F17200 Nicotine dependence, unspecified, uncomplicated: Secondary | ICD-10-CM | POA: Insufficient documentation

## 2013-10-31 DIAGNOSIS — F411 Generalized anxiety disorder: Secondary | ICD-10-CM | POA: Insufficient documentation

## 2013-10-31 DIAGNOSIS — F1210 Cannabis abuse, uncomplicated: Secondary | ICD-10-CM | POA: Insufficient documentation

## 2013-10-31 DIAGNOSIS — F3289 Other specified depressive episodes: Secondary | ICD-10-CM | POA: Insufficient documentation

## 2013-10-31 DIAGNOSIS — F329 Major depressive disorder, single episode, unspecified: Secondary | ICD-10-CM | POA: Insufficient documentation

## 2013-10-31 DIAGNOSIS — R259 Unspecified abnormal involuntary movements: Secondary | ICD-10-CM | POA: Insufficient documentation

## 2013-10-31 LAB — CBC WITH DIFFERENTIAL/PLATELET
BASOS ABS: 0 10*3/uL (ref 0.0–0.1)
Basophils Relative: 0 % (ref 0–1)
EOS PCT: 1 % (ref 0–5)
Eosinophils Absolute: 0.1 10*3/uL (ref 0.0–0.7)
HEMATOCRIT: 42.6 % (ref 36.0–46.0)
Hemoglobin: 14.5 g/dL (ref 12.0–15.0)
Lymphocytes Relative: 37 % (ref 12–46)
Lymphs Abs: 3.3 10*3/uL (ref 0.7–4.0)
MCH: 33.4 pg (ref 26.0–34.0)
MCHC: 34 g/dL (ref 30.0–36.0)
MCV: 98.2 fL (ref 78.0–100.0)
MONO ABS: 0.8 10*3/uL (ref 0.1–1.0)
Monocytes Relative: 9 % (ref 3–12)
Neutro Abs: 4.8 10*3/uL (ref 1.7–7.7)
Neutrophils Relative %: 54 % (ref 43–77)
Platelets: 341 10*3/uL (ref 150–400)
RBC: 4.34 MIL/uL (ref 3.87–5.11)
RDW: 13 % (ref 11.5–15.5)
WBC: 8.9 10*3/uL (ref 4.0–10.5)

## 2013-10-31 MED ORDER — LORAZEPAM 1 MG PO TABS: 0.0000 mg | ORAL_TABLET | Freq: Two times a day (BID) | ORAL | Status: DC

## 2013-10-31 MED ORDER — LORAZEPAM 1 MG PO TABS
0.0000 mg | ORAL_TABLET | Freq: Four times a day (QID) | ORAL | Status: DC
  Administered 2013-11-01: 1 mg via ORAL
  Filled 2013-10-31: qty 1

## 2013-10-31 NOTE — ED Notes (Signed)
Pt states having withdrawals from alcohol, xanax's, klonpins, herion; states has had help before, denies SI

## 2013-10-31 NOTE — ED Provider Notes (Signed)
CSN: [id number]     Arrival date & time 10/31/13  2324 History  This chart was scribed for Loren Racer, MD by Carl Best, ED Scribe. This patient was seen in room MH01/MH01 and the patient's care was started at 11:44 PM.     Chief Complaint  Patient presents with  . Medical Clearance   The history is provided by the patient. No language interpreter was used.   HPI Comments: [name] is a 33 y.o. female with a history of depression, anxiety, bipolar 1 disorder, and polysubstance abuse who presents to the Emergency Department seeking detox from xanax and clonopin.  She states that she has been taking those medications for a couple of months and she last had a benzodiazapine an hour ago.  The patient states that she has been through detox from benzodiazapines in the past and did not experience any tremors.  She states that she used heroin a 6 days ago.  She states that she has detoxed from heroin in the past and had trouble keeping fluids down.  The patient states that she last used alcohol at 3-4 AM this morning.  She states that she drinks everyday.  The patient states that she has experienced shakes while detoxing from alcohol.  She denies taking any medication to treat her psychiatric disorders.  The patient states that she has been through inpatient detox in the past but does not remember when she was last admitted.  She denies SI, nausea, and hallucinations as associated symptoms and states that she "feels fine".  She has mild tremors but states this improved since taking the Klonopin. Patient denies any suicidal or homicidal ideation. She has no visual or auditory hallucinations.   Past Medical History  Diagnosis Date  . Depression   . Bipolar 1 disorder   . Anxiety   . Drug abuse   . ETOH abuse   . Polysubstance abuse    History reviewed. No pertinent past surgical history. No family history on file. History  Substance Use Topics  . Smoking status: Current Every Day  Smoker -- 2.00 packs/day for 17 years    Types: Cigarettes  . Smokeless tobacco: Never Used  . Alcohol Use: 7.2 oz/week    12 Cans of beer per week     Comment: 12 pack of beer per day plus liquor or wine, whatever is available and fits in her pocket at work   OB History   Grav Para Term Preterm Abortions TAB SAB Ect Mult Living                 Review of Systems  Constitutional: Negative for fever and chills.  Respiratory: Negative for cough and shortness of breath.   Cardiovascular: Negative for chest pain.  Gastrointestinal: Negative for nausea, vomiting, abdominal pain, diarrhea and constipation.  Musculoskeletal: Negative for back pain, neck pain and neck stiffness.  Skin: Negative for rash and wound.  Neurological: Positive for tremors. Negative for dizziness, weakness, light-headedness, numbness and headaches.  Psychiatric/Behavioral: Negative for suicidal ideas, hallucinations, self-injury and agitation. The patient is not nervous/anxious.   All other systems reviewed and are negative.     Allergies  Review of patient's allergies indicates no known allergies.  Home Medications   Prior to Admission medications   Medication Sig Start Date End Date Taking? Authorizing Provider  carbamazepine (TEGRETOL XR) 200 MG 12 hr tablet Take 1 tablet (200 mg total) by mouth 2 (two) times daily. 02/26/13   Fransisca Kaufmann, NP  FLUoxetine (PROZAC) 20 MG capsule Take 1 capsule (20 mg total) by mouth daily. 02/26/13   Fransisca KaufmannLaura Davis, NP  ibuprofen (ADVIL,MOTRIN) 200 MG tablet Take 3 tablets (600 mg total) by mouth every 6 (six) hours as needed. 02/26/13   Fransisca KaufmannLaura Davis, NP  OLANZapine zydis (ZYPREXA) 10 MG disintegrating tablet Take 1 tablet (10 mg total) by mouth 2 (two) times daily. 02/26/13   Fransisca KaufmannLaura Davis, NP  pantoprazole (PROTONIX) 40 MG tablet Take 1 tablet (40 mg total) by mouth daily. 02/26/13   Fransisca KaufmannLaura Davis, NP  traZODone (DESYREL) 100 MG tablet Take 1 tablet (100 mg total) by mouth at  bedtime as needed and may repeat dose one time if needed for sleep. 02/26/13   Fransisca KaufmannLaura Davis, NP   Triage Vitals: BP 138/92  Pulse 89  Temp(Src) 98 F (36.7 C)  SpO2 96%  LMP 10/24/2013  Physical Exam  Nursing note and vitals reviewed. Constitutional: She is oriented to person, place, and time. She appears well-developed and well-nourished. No distress.  HENT:  Head: Normocephalic and atraumatic.  Mouth/Throat: Oropharynx is clear and moist.  Eyes: EOM are normal. Pupils are equal, round, and reactive to light.  Pupils 4 mm and equally reactive.  Neck: Normal range of motion. Neck supple.  Cardiovascular: Normal rate and regular rhythm.   Pulmonary/Chest: Effort normal and breath sounds normal. No respiratory distress. She has no wheezes. She has no rales. She exhibits no tenderness.  Abdominal: Soft. Bowel sounds are normal. She exhibits no distension and no mass. There is no tenderness. There is no rebound and no guarding.  Musculoskeletal: Normal range of motion. She exhibits no edema and no tenderness.  Neurological: She is alert and oriented to person, place, and time.  Patient is alert and oriented x3 with clear, goal oriented speech. Patient has 5/5 motor in all extremities. Sensation is intact to light touch. Patient has a normal gait and walks without assistance. Fine tremor   Skin: Skin is warm and dry. No rash noted. No erythema.  Psychiatric: She has a normal mood and affect. Her behavior is normal.    ED Course  Procedures (including critical care time)  DIAGNOSTIC STUDIES: Oxygen Saturation is 96% on room air, adequate by my interpretation.    COORDINATION OF CARE: 11:50 PM- Advised the patient that she will be evaluated by a psychiatrist and the patient agreed to the treatment plan.    Labs Review Labs Reviewed - No data to display  Imaging Review No results found.   EKG Interpretation None      MDM   Final diagnoses:  None    I personally  performed the services described in this documentation, which was scribed in my presence. The recorded information has been reviewed and is accurate.  Good insight. Will discuss with TSS.  Patient is medically cleared for psychiatric evaluation. We'll keep on CIWA protocol while awaiting inpatient placement.  Loren Raceravid Lorre Opdahl, MD 11/02/13 [phone]

## 2013-11-01 ENCOUNTER — Encounter (HOSPITAL_COMMUNITY): Payer: Self-pay | Admitting: *Deleted

## 2013-11-01 ENCOUNTER — Inpatient Hospital Stay (HOSPITAL_COMMUNITY)
Admission: EM | Admit: 2013-11-01 | Discharge: 2013-11-05 | DRG: 897 | Disposition: A | Discharge status: Home / Self Care | Payer: Federal, State, Local not specified - Other | Source: Intra-hospital | Attending: Psychiatry | Admitting: Psychiatry

## 2013-11-01 DIAGNOSIS — F411 Generalized anxiety disorder: Secondary | ICD-10-CM | POA: Diagnosis present

## 2013-11-01 DIAGNOSIS — F319 Bipolar disorder, unspecified: Secondary | ICD-10-CM | POA: Diagnosis present

## 2013-11-01 DIAGNOSIS — F1110 Opioid abuse, uncomplicated: Secondary | ICD-10-CM | POA: Diagnosis present

## 2013-11-01 DIAGNOSIS — Z598 Other problems related to housing and economic circumstances: Secondary | ICD-10-CM

## 2013-11-01 DIAGNOSIS — F1994 Other psychoactive substance use, unspecified with psychoactive substance-induced mood disorder: Secondary | ICD-10-CM

## 2013-11-01 DIAGNOSIS — F320 Major depressive disorder, single episode, mild: Secondary | ICD-10-CM | POA: Diagnosis present

## 2013-11-01 DIAGNOSIS — F1210 Cannabis abuse, uncomplicated: Secondary | ICD-10-CM | POA: Diagnosis present

## 2013-11-01 DIAGNOSIS — IMO0002 Reserved for concepts with insufficient information to code with codable children: Secondary | ICD-10-CM | POA: Diagnosis not present

## 2013-11-01 DIAGNOSIS — G4700 Insomnia, unspecified: Secondary | ICD-10-CM | POA: Diagnosis present

## 2013-11-01 DIAGNOSIS — F3110 Bipolar disorder, current episode manic without psychotic features, unspecified: Secondary | ICD-10-CM

## 2013-11-01 DIAGNOSIS — F17200 Nicotine dependence, unspecified, uncomplicated: Secondary | ICD-10-CM | POA: Diagnosis present

## 2013-11-01 DIAGNOSIS — Z5987 Material hardship due to limited financial resources, not elsewhere classified: Secondary | ICD-10-CM

## 2013-11-01 DIAGNOSIS — F1320 Sedative, hypnotic or anxiolytic dependence, uncomplicated: Secondary | ICD-10-CM | POA: Diagnosis present

## 2013-11-01 DIAGNOSIS — F1910 Other psychoactive substance abuse, uncomplicated: Secondary | ICD-10-CM | POA: Diagnosis present

## 2013-11-01 DIAGNOSIS — Z5989 Other problems related to housing and economic circumstances: Secondary | ICD-10-CM | POA: Diagnosis not present

## 2013-11-01 LAB — RAPID URINE DRUG SCREEN, HOSP PERFORMED
Amphetamines: NOT DETECTED
Barbiturates: NOT DETECTED
Benzodiazepines: POSITIVE — AB
Cocaine: NOT DETECTED
Opiates: NOT DETECTED
Tetrahydrocannabinol: POSITIVE — AB

## 2013-11-01 LAB — COMPREHENSIVE METABOLIC PANEL
ALT: 24 U/L (ref 0–35)
AST: 18 U/L (ref 0–37)
Albumin: 4.5 g/dL (ref 3.5–5.2)
Alkaline Phosphatase: 77 U/L (ref 39–117)
Anion gap: 16 — ABNORMAL HIGH (ref 5–15)
BUN: 13 mg/dL (ref 6–23)
CALCIUM: 10 mg/dL (ref 8.4–10.5)
CO2: 23 meq/L (ref 19–32)
CREATININE: 0.7 mg/dL (ref 0.50–1.10)
Chloride: 102 mEq/L (ref 96–112)
GLUCOSE: 105 mg/dL — AB (ref 70–99)
Potassium: 4.2 mEq/L (ref 3.7–5.3)
Sodium: 141 mEq/L (ref 137–147)
Total Bilirubin: 0.3 mg/dL (ref 0.3–1.2)
Total Protein: 8.2 g/dL (ref 6.0–8.3)

## 2013-11-01 LAB — URINALYSIS, ROUTINE W REFLEX MICROSCOPIC
Bilirubin Urine: NEGATIVE
GLUCOSE, UA: NEGATIVE mg/dL
Ketones, ur: NEGATIVE mg/dL
LEUKOCYTES UA: NEGATIVE
Nitrite: NEGATIVE
Protein, ur: NEGATIVE mg/dL
SPECIFIC GRAVITY, URINE: 1.024 (ref 1.005–1.030)
Urobilinogen, UA: 0.2 mg/dL (ref 0.0–1.0)
pH: 5 (ref 5.0–8.0)

## 2013-11-01 LAB — PREGNANCY, URINE: PREG TEST UR: NEGATIVE

## 2013-11-01 LAB — ETHANOL

## 2013-11-01 LAB — CARBAMAZEPINE LEVEL, TOTAL

## 2013-11-01 LAB — URINE MICROSCOPIC-ADD ON

## 2013-11-01 MED ORDER — METHOCARBAMOL 500 MG PO TABS
500.0000 mg | ORAL_TABLET | Freq: Three times a day (TID) | ORAL | Status: DC | PRN
  Administered 2013-11-01 – 2013-11-04 (×6): 500 mg via ORAL
  Filled 2013-11-01 (×3): qty 1
  Filled 2013-11-01: qty 30
  Filled 2013-11-01 (×3): qty 1

## 2013-11-01 MED ORDER — FLUOXETINE HCL 20 MG PO CAPS
20.0000 mg | ORAL_CAPSULE | Freq: Every day | ORAL | Status: DC
  Filled 2013-11-01: qty 1

## 2013-11-01 MED ORDER — NICOTINE 21 MG/24HR TD PT24
MEDICATED_PATCH | TRANSDERMAL | Status: AC
  Filled 2013-11-01: qty 1

## 2013-11-01 MED ORDER — LORAZEPAM 1 MG PO TABS
0.0000 mg | ORAL_TABLET | Freq: Four times a day (QID) | ORAL | Status: DC
  Administered 2013-11-01: 2 mg via ORAL
  Administered 2013-11-01 – 2013-11-02 (×4): 1 mg via ORAL
  Filled 2013-11-01: qty 2
  Filled 2013-11-01: qty 1
  Filled 2013-11-01: qty 2
  Filled 2013-11-01: qty 1
  Filled 2013-11-01: qty 2

## 2013-11-01 MED ORDER — LOPERAMIDE HCL 2 MG PO CAPS: 2.0000 mg | ORAL_CAPSULE | ORAL | Status: DC | PRN

## 2013-11-01 MED ORDER — MAGNESIUM HYDROXIDE 400 MG/5ML PO SUSP: 30.0000 mL | Freq: Every day | ORAL | Status: DC | PRN

## 2013-11-01 MED ORDER — CARBAMAZEPINE ER 200 MG PO TB12
200.0000 mg | ORAL_TABLET | Freq: Two times a day (BID) | ORAL | Status: DC
  Administered 2013-11-01 – 2013-11-05 (×9): 200 mg via ORAL
  Filled 2013-11-01 (×13): qty 1

## 2013-11-01 MED ORDER — ALUM & MAG HYDROXIDE-SIMETH 200-200-20 MG/5ML PO SUSP: 30.0000 mL | ORAL | Status: DC | PRN

## 2013-11-01 MED ORDER — TRAZODONE HCL 100 MG PO TABS
100.0000 mg | ORAL_TABLET | Freq: Every evening | ORAL | Status: DC | PRN
  Administered 2013-11-01 – 2013-11-05 (×7): 100 mg via ORAL
  Filled 2013-11-01 (×4): qty 1
  Filled 2013-11-01: qty 28
  Filled 2013-11-01 (×3): qty 1

## 2013-11-01 MED ORDER — OLANZAPINE 10 MG PO TBDP
10.0000 mg | ORAL_TABLET | Freq: Two times a day (BID) | ORAL | Status: DC
  Filled 2013-11-01: qty 1

## 2013-11-01 MED ORDER — CARBAMAZEPINE ER 200 MG PO TB12
200.0000 mg | ORAL_TABLET | Freq: Two times a day (BID) | ORAL | Status: DC
  Filled 2013-11-01: qty 1

## 2013-11-01 MED ORDER — PANTOPRAZOLE SODIUM 40 MG PO TBEC: 40.0000 mg | DELAYED_RELEASE_TABLET | Freq: Every day | ORAL | Status: DC

## 2013-11-01 MED ORDER — PANTOPRAZOLE SODIUM 40 MG PO TBEC
40.0000 mg | DELAYED_RELEASE_TABLET | Freq: Every day | ORAL | Status: DC
  Administered 2013-11-01 – 2013-11-05 (×5): 40 mg via ORAL
  Filled 2013-11-01 (×7): qty 1

## 2013-11-01 MED ORDER — TRAZODONE HCL 100 MG PO TABS
100.0000 mg | ORAL_TABLET | Freq: Every evening | ORAL | Status: DC | PRN
  Filled 2013-11-01: qty 1

## 2013-11-01 MED ORDER — ACETAMINOPHEN 325 MG PO TABS
650.0000 mg | ORAL_TABLET | Freq: Four times a day (QID) | ORAL | Status: DC | PRN
  Administered 2013-11-01: 650 mg via ORAL
  Filled 2013-11-01: qty 2

## 2013-11-01 MED ORDER — FLUOXETINE HCL 20 MG PO CAPS
20.0000 mg | ORAL_CAPSULE | Freq: Every day | ORAL | Status: DC
  Administered 2013-11-01 – 2013-11-04 (×4): 20 mg via ORAL
  Filled 2013-11-01 (×7): qty 1

## 2013-11-01 MED ORDER — NAPROXEN 500 MG PO TABS
500.0000 mg | ORAL_TABLET | Freq: Two times a day (BID) | ORAL | Status: DC | PRN
  Administered 2013-11-01 – 2013-11-04 (×8): 500 mg via ORAL
  Filled 2013-11-01 (×8): qty 1

## 2013-11-01 MED ORDER — DICYCLOMINE HCL 20 MG PO TABS
20.0000 mg | ORAL_TABLET | Freq: Four times a day (QID) | ORAL | Status: DC | PRN
  Administered 2013-11-01 – 2013-11-02 (×3): 20 mg via ORAL
  Filled 2013-11-01 (×3): qty 1

## 2013-11-01 MED ORDER — HYDROXYZINE HCL 25 MG PO TABS
25.0000 mg | ORAL_TABLET | Freq: Four times a day (QID) | ORAL | Status: DC | PRN
  Administered 2013-11-01 – 2013-11-02 (×2): 25 mg via ORAL
  Filled 2013-11-01 (×2): qty 1

## 2013-11-01 MED ORDER — ONDANSETRON 4 MG PO TBDP
4.0000 mg | ORAL_TABLET | Freq: Four times a day (QID) | ORAL | Status: DC | PRN
  Administered 2013-11-01 – 2013-11-05 (×3): 4 mg via ORAL
  Filled 2013-11-01 (×3): qty 1

## 2013-11-01 MED ORDER — LORAZEPAM 1 MG PO TABS
0.0000 mg | ORAL_TABLET | Freq: Two times a day (BID) | ORAL | Status: DC
  Filled 2013-11-01: qty 2

## 2013-11-01 MED ORDER — NICOTINE 21 MG/24HR TD PT24
21.0000 mg | MEDICATED_PATCH | Freq: Once | TRANSDERMAL | Status: DC
  Administered 2013-11-01: 21 mg via TRANSDERMAL

## 2013-11-01 MED ORDER — OLANZAPINE 10 MG PO TBDP
10.0000 mg | ORAL_TABLET | Freq: Two times a day (BID) | ORAL | Status: DC
  Administered 2013-11-01 – 2013-11-05 (×9): 10 mg via ORAL
  Filled 2013-11-01 (×13): qty 1

## 2013-11-01 NOTE — [facility] Group Notes (Signed)
[facility] Group Notes:  (Clinical Social Work) 300 hall group  11/01/2013     10-11AM  Summary of Progress/Problems:   The main focus of today's process group was for the patient to identify ways in which they have in the past sabotaged their own recovery. Motivational Interviewing and a worksheet were utilized to help patients explore in depth the perceived benefits and costs of their substance use, as well as the potential benefits and costs of stopping.  The Stages of Change were explained using a handout, with an emphasis on making plans to deal with sabotaging behaviors proactively.  The patient came about halfway through group, starting to program on 300 hall.  She sat down and immediately went to sleep.  She slept awhile, then got up and left group, then returned and fell asleep again.   Type of Therapy:  Group Therapy - Process   Participation Level:  None  Participation Quality:  Drowsy  Affect:  Flat  Cognitive:  Lacking  Insight:  None  Engagement in Therapy:  None  Modes of Intervention:  Education, Support and Processing, Motivational Interviewing  Ambrose MantleMareida Grossman-Orr, LCSW 11/01/2013, 11:55 AM

## 2013-11-01 NOTE — ED Notes (Signed)
[facility] notified of need for transported to behavioral health care

## 2013-11-01 NOTE — Progress Notes (Signed)
Patient ID: [name], female   DOB: 11-May-1980, 33 y.o.   MRN: [id number] Pt alert and oriented. Pt intrusive and med seeking, requesting to get anything she can have. Pt interacting with peer and staff. Denies SI/HI, -A/hall, +V/hall reports seeing clouds.[name]. Verbally contracts for safety. C/o stomach cramps, restless legs, sore back and tremors.  Medication given as ordered. Monitored Q 15min. Will continue to monitor and evaluate for stabilization.

## 2013-11-01 NOTE — [facility] Suicide Risk Assessment (Signed)
Suicide Risk Assessment  Admission Assessment     Nursing information obtained from:  Patient Demographic factors:  Caucasian;Gay, lesbian, or bisexual orientation;Low socioeconomic status Current Mental Status:  NA Loss Factors:  Financial problems / change in socioeconomic status Historical Factors:  Prior suicide attempts;Family history of mental illness or substance abuse Risk Reduction Factors:  Living with another person, especially a relative;Positive therapeutic relationship Total Time spent with patient: 45 minutes  CLINICAL FACTORS:   Dysthymia Alcohol/Substance Abuse/Dependencies More than one psychiatric diagnosis Unstable or Poor Therapeutic Relationship Previous Psychiatric Diagnoses and Treatments  Psychiatric Specialty Exam:     Blood pressure 119/78, pulse 98, temperature 97.3 F (36.3 C), temperature source Oral, resp. rate 18, height 5\' 2"  (1.575 m), weight 202 lb 8 oz (91.853 kg), last menstrual period 10/24/2013, SpO2 99.00%.Body mass index is 37.03 kg/(m^2).  General Appearance: Casual  Eye Contact::  Fair  Speech:  Slow  Volume:  Decreased  Mood:  Dysphoric  Affect:  Constricted  Thought Process:  Coherent  Orientation:  Full (Time, Place, and Person)  Thought Content:  Rumination  Suicidal Thoughts:  No  Homicidal Thoughts:  No  Memory:  Immediate;   Fair Recent;   Fair  Judgement:  Poor  Insight:  Shallow  Psychomotor Activity:  Decreased  Concentration:  Fair  Recall:  FiservFair  Fund of Knowledge:Fair  Language: Fair  Akathisia:  Negative  Handed:  Right  AIMS (if indicated):     Assets:  Desire for Improvement Financial Resources/Insurance Leisure Time  Sleep:      Musculoskeletal: Strength & Muscle Tone: within normal limits Gait & Station: normal Patient leans: N/A  COGNITIVE FEATURES THAT CONTRIBUTE TO RISK:  Closed-mindedness Polarized thinking    SUICIDE RISK:   Moderate:  Frequent suicidal ideation with limited intensity, and  duration, some specificity in terms of plans, no associated intent, good self-control, limited dysphoria/symptomatology, some risk factors present, and identifiable protective factors, including available and accessible social support.  PLAN OF CARE:  I certify that inpatient services furnished can reasonably be expected to improve the patient's condition.  [name] 11/01/2013, 9:41 AM

## 2013-11-01 NOTE — Progress Notes (Signed)
D.  Pt. Has been anxious and irritable today.  Reports that she is not thinking as she should because of her detox.  A.  Medications given as ordered.   Support given.  R.  Pt. Receptive.

## 2013-11-01 NOTE — BHH Counselor (Signed)
Adult Psychosocial Assessment Update Interdisciplinary Team  [facility]:  Admissions Discharges  Date:  02/20/13 Date:  Date:  12/29/10 Date:  Date: Date:  Date: Date:  Date: Date:   Changes since the last Psychosocial Assessment (including adherence to outpatient mental health and/or substance abuse treatment, situational issues contributing to decompensation and/or relapse). 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  She had not been to AA in 1-1/2 years, returned 3 days ago and found good supports.  She has been using opiates (new for her) in addition to benzos and alcohol.  She lives with a roommate, who does not use.  [facility], but has not been there for about 1 year.  She wants to program on the [address].             Discharge Plan 1. Will you be returning to the same living situation after discharge?   Yes:  XX No:      If no, what is your plan?    With roommate       2. Would you like a referral for services when you are discharged? Yes: XX    If yes, for what services?  No:       [facility]       Summary and Recommendations (to be completed by the evaluator) This is a 33yo Caucasian female who was hospitalized with depression and for detox from alcohol, benzos, and opiates.  She will go back to live with her current roommate.  [facility].  She would benefit from safety monitoring, medication evaluation, psychoeducation, group therapy, and discharge planning to link with ongoing resources.                        Signature:  [name], [name], 11/01/2013 12:44 PM

## 2013-11-01 NOTE — Progress Notes (Signed)
[facility] Group Notes:  (Nursing/MHT/Case Management/Adjunct)  Date:  11/01/2013  Time:  6:48 PM  Type of Therapy:  Psychoeducational Skills  Participation Level:  Active  Participation Quality:  Appropriate and Attentive  Affect:  Appropriate  Cognitive:  Appropriate  Insight:  Appropriate  Engagement in Group:  Engaged and Supportive  Modes of Intervention:  Activity  Summary of Progress/Problems: Pts played an activity of Pictionary using coping skills. Pts enjoyed playing while learning new coping skills.  Kardell Virgil C 11/01/2013, 6:48 PM 

## 2013-11-01 NOTE — BH Assessment (Signed)
Assessment completed. Consulted Susann GivensFran Hobson,NP who recommended inpatient treatment. [facility] Dr. Ranae PalmsYelverton has been notified of the [facility] and [facility].

## 2013-11-01 NOTE — ED Notes (Signed)
[facility]

## 2013-11-01 NOTE — H&P (Signed)
Psychiatric Admission Assessment Adult  Patient Identification:  [name] Date of Evaluation:  11/01/2013 Chief Complaint:  opiate use disorder moderate alcohol use disorder moderate History of Present Illness: Patient notes that she returned to [facility] 3 days ago after being out of it for 1 1/2 years. She made the decision to get clean and came to the hospital. Elements:  Location:  Adult unit [facility]. Quality:  chronic. Severity:  moderate to severe. Timing:  6-8 months. Duration:  years. Context:  patient relapsed shortly after her last discharge from [facility] in 02/2013. Associated Signs/Synptoms: Depression Symptoms:  denies (Hypo) Manic Symptoms:  denies Anxiety Symptoms:  denies Psychotic Symptoms:  denies PTSD Symptoms:  denies Total Time spent with patient: 30 minutes  Psychiatric Specialty Exam: Physical Exam  Psychiatric: Her speech is normal and behavior is normal. Judgment and thought content normal. Her mood appears anxious. Cognition and memory are normal.  Patient is seen and the chart is reviewed. I agree with the findings of the exam completed in the ED with no exceptions.    Review of Systems  Constitutional: Positive for chills, malaise/fatigue and diaphoresis.  Eyes: Negative.   Respiratory: Negative.   Cardiovascular: Negative.   Gastrointestinal: Positive for nausea and diarrhea.  Genitourinary: Negative.   Musculoskeletal: Negative.   Neurological: Positive for tingling, weakness and headaches.  Endo/Heme/Allergies: Negative.   Psychiatric/Behavioral: The patient is nervous/anxious.     Blood pressure 134/110, pulse 80, temperature 97.3 F (36.3 C), temperature source Oral, resp. rate 18, height 5' 2"  (1.575 m), weight 91.853 kg (202 lb 8 oz), last menstrual period 10/24/2013, SpO2 99.00%.Body mass index is 37.03 kg/(m^2).  General Appearance: Disheveled  Eye Contact::  Good  Speech:  Clear and Coherent  Volume:  Normal  Mood:  Anxious  Affect:   Congruent  Thought Process:  Goal Directed  Orientation:  Full (Time, Place, and Person)  Thought Content:  WDL  Suicidal Thoughts:  No  Homicidal Thoughts:  No  Memory:  Immediate;   Fair  Judgement:  Good  Insight:  Present  Psychomotor Activity:  Restlessness  Concentration:  Fair  Recall:  [place] of Knowledge:NA  Language: Good  Akathisia:  No  Handed:  Right  AIMS (if indicated):     Assets:  Communication Skills Desire for Improvement Housing  Sleep:       Musculoskeletal: Strength & Muscle Tone: within normal limits Gait & Station: normal Patient leans: N/A  Past Psychiatric History: Diagnosis:  Hospitalizations:  Outpatient Care:  Substance Abuse Care:  Self-Mutilation:  Suicidal Attempts:  Violent Behaviors:   Past Medical History:   Past Medical History  Diagnosis Date  . Depression   . Bipolar 1 disorder   . Anxiety   . Drug abuse   . ETOH abuse   . Polysubstance abuse    None. Allergies:  No Known Allergies PTA Medications: Prescriptions prior to admission  Medication Sig Dispense Refill  . carbamazepine (TEGRETOL XR) 200 MG 12 hr tablet Take 1 tablet (200 mg total) by mouth 2 (two) times daily.  60 tablet  0  . FLUoxetine (PROZAC) 20 MG capsule Take 1 capsule (20 mg total) by mouth daily.  30 capsule  0  . ibuprofen (ADVIL,MOTRIN) 200 MG tablet Take 3 tablets (600 mg total) by mouth every 6 (six) hours as needed.  30 tablet  0  . OLANZapine zydis (ZYPREXA) 10 MG disintegrating tablet Take 1 tablet (10 mg total) by mouth 2 (two) times daily.  60 tablet  0  . pantoprazole (PROTONIX) 40 MG tablet Take 1 tablet (40 mg total) by mouth daily.  30 tablet  0  . traZODone (DESYREL) 100 MG tablet Take 1 tablet (100 mg total) by mouth at bedtime as needed and may repeat dose one time if needed for sleep.  60 tablet  0    Previous Psychotropic Medications:  Medication/Dose                 Substance Abuse History in the last 12 months:  Yes.     Consequences of Substance Abuse: Withdrawal Symptoms:   Cramps Diaphoresis Diarrhea Nausea Tremors  Social History:  reports that she has been smoking Cigarettes.  She has a 34 pack-year smoking history. She has never used smokeless tobacco. She reports that she drinks about 7.2 ounces of alcohol per week. She reports that she uses illicit drugs (Heroin, Benzodiazepines, and Marijuana) about 7 times per week. Additional Social History: Pain Medications: denies Prescriptions: denies Over the Counter: denies Longest period of sobriety (when/how long): 4 months  Negative Consequences of Use: Financial;Legal;Personal relationships;Work / Youth worker Withdrawal Symptoms: Tremors;Nausea / Vomiting Name of Substance 1: Benzodiazepines 1 - Age of First Use: 11 1 - Amount (size/oz): 10-12 pills (xanax or Klonopin)  1 - Frequency: daily  1 - Duration: 2-3 months  1 - Last Use / Amount: 10-31-13  Name of Substance 2: Alcohol  2 - Age of First Use: 14 2 - Amount (size/oz): "at least a 12pk" 2 - Frequency: daily  2 - Last Use / Amount: 10-30-13 "at least a 12pk"  Name of Substance 3: THC  3 - Age of First Use: 15 3 - Amount (size/oz): "1 gram" 3 - Frequency: daily  3 - Duration: 1 year 3 - Last Use / Amount: 10-30-13 Name of Substance 4: Heroin  4 - Age of First Use: 31 4 - Amount (size/oz): "1 gram" 4 - Frequency: weekly  4 - Duration: 6 mos.  4 - Last Use / Amount: 10-25-13            Current Place of Residence:   Place of Birth:   Family Members: Marital Status:  Single Children:  Sons:  Daughters: Relationships: Education:   Educational Problems/Performance: Religious Beliefs/Practices: History of Abuse (Emotional/Phsycial/Sexual) Occupational Experiences; Military History:  None. Legal History: Hobbies/Interests:  Family History:  History reviewed. No pertinent family history.  Results for orders placed during the hospital encounter of 10/31/13 (from the past 72  hour(s))  CARBAMAZEPINE LEVEL, TOTAL     Status: Abnormal   Collection Time    10/31/13 11:39 PM      Result Value Ref Range   Carbamazepine Lvl <0.5 (*) 4.0 - 12.0 ug/mL   Comment: Performed at [facility]  CBC WITH DIFFERENTIAL     Status: None   Collection Time    10/31/13 11:45 PM      Result Value Ref Range   WBC 8.9  4.0 - 10.5 K/uL   RBC 4.34  3.87 - 5.11 MIL/uL   Hemoglobin 14.5  12.0 - 15.0 g/dL   HCT 42.6  36.0 - 46.0 %   MCV 98.2  78.0 - 100.0 fL   MCH 33.4  26.0 - 34.0 pg   MCHC 34.0  30.0 - 36.0 g/dL   RDW 13.0  11.5 - 15.5 %   Platelets 341  150 - 400 K/uL   Neutrophils Relative % 54  43 - 77 %  Neutro Abs 4.8  1.7 - 7.7 K/uL   Lymphocytes Relative 37  12 - 46 %   Lymphs Abs 3.3  0.7 - 4.0 K/uL   Monocytes Relative 9  3 - 12 %   Monocytes Absolute 0.8  0.1 - 1.0 K/uL   Eosinophils Relative 1  0 - 5 %   Eosinophils Absolute 0.1  0.0 - 0.7 K/uL   Basophils Relative 0  0 - 1 %   Basophils Absolute 0.0  0.0 - 0.1 K/uL  COMPREHENSIVE METABOLIC PANEL     Status: Abnormal   Collection Time    10/31/13 11:45 PM      Result Value Ref Range   Sodium 141  137 - 147 mEq/L   Potassium 4.2  3.7 - 5.3 mEq/L   Chloride 102  96 - 112 mEq/L   CO2 23  19 - 32 mEq/L   Glucose, Bld 105 (*) 70 - 99 mg/dL   BUN 13  6 - 23 mg/dL   Creatinine, Ser 0.70  0.50 - 1.10 mg/dL   Calcium 10.0  8.4 - 10.5 mg/dL   Total Protein 8.2  6.0 - 8.3 g/dL   Albumin 4.5  3.5 - 5.2 g/dL   AST 18  0 - 37 U/L   ALT 24  0 - 35 U/L   Alkaline Phosphatase 77  39 - 117 U/L   Total Bilirubin 0.3  0.3 - 1.2 mg/dL   GFR calc non Af Amer >90  >90 mL/min   GFR calc Af Amer >90  >90 mL/min   Comment: (NOTE)     The eGFR has been calculated using the CKD EPI equation.     This calculation has not been validated in all clinical situations.     eGFR's persistently <90 mL/min signify possible Chronic Kidney     Disease.   Anion gap 16 (*) 5 - 15  ETHANOL     Status: None   Collection Time     10/31/13 11:45 PM      Result Value Ref Range   Alcohol, Ethyl (B) <11  0 - 11 mg/dL   Comment:            LOWEST DETECTABLE LIMIT FOR     SERUM ALCOHOL IS 11 mg/dL     FOR MEDICAL PURPOSES ONLY  URINE RAPID DRUG SCREEN (HOSP PERFORMED)     Status: Abnormal   Collection Time    10/31/13 11:45 PM      Result Value Ref Range   Opiates NONE DETECTED  NONE DETECTED   Cocaine NONE DETECTED  NONE DETECTED   Benzodiazepines POSITIVE (*) NONE DETECTED   Amphetamines NONE DETECTED  NONE DETECTED   Tetrahydrocannabinol POSITIVE (*) NONE DETECTED   Barbiturates NONE DETECTED  NONE DETECTED   Comment:            DRUG SCREEN FOR MEDICAL PURPOSES     ONLY.  IF CONFIRMATION IS NEEDED     FOR ANY PURPOSE, NOTIFY LAB     WITHIN 5 DAYS.                LOWEST DETECTABLE LIMITS     FOR URINE DRUG SCREEN     Drug Class       Cutoff (ng/mL)     Amphetamine      1000     Barbiturate      200     Benzodiazepine   818     Tricyclics  300     Opiates          300     Cocaine          300     THC              50  URINALYSIS, ROUTINE W REFLEX MICROSCOPIC     Status: Abnormal   Collection Time    10/31/13 11:45 PM      Result Value Ref Range   Color, Urine YELLOW  YELLOW   APPearance CLOUDY (*) CLEAR   Specific Gravity, Urine 1.024  1.005 - 1.030   pH 5.0  5.0 - 8.0   Glucose, UA NEGATIVE  NEGATIVE mg/dL   Hgb urine dipstick MODERATE (*) NEGATIVE   Bilirubin Urine NEGATIVE  NEGATIVE   Ketones, ur NEGATIVE  NEGATIVE mg/dL   Protein, ur NEGATIVE  NEGATIVE mg/dL   Urobilinogen, UA 0.2  0.0 - 1.0 mg/dL   Nitrite NEGATIVE  NEGATIVE   Leukocytes, UA NEGATIVE  NEGATIVE  PREGNANCY, URINE     Status: None   Collection Time    10/31/13 11:45 PM      Result Value Ref Range   Preg Test, Ur NEGATIVE  NEGATIVE   Comment:            THE SENSITIVITY OF THIS     METHODOLOGY IS >20 mIU/mL.  URINE MICROSCOPIC-ADD ON     Status: Abnormal   Collection Time    10/31/13 11:45 PM      Result Value Ref  Range   Squamous Epithelial / LPF MANY (*) RARE   WBC, UA 0-2  <3 WBC/hpf   RBC / HPF 0-2  <3 RBC/hpf   Bacteria, UA FEW (*) RARE   Psychological Evaluations:  Assessment:   DSM5:  Schizophrenia Disorders:   Obsessive-Compulsive Disorders:   Trauma-Stressor Disorders:   Substance/Addictive Disorders:  Opioid Disorder - Moderate (304.00) Depressive Disorders:  Major Depressive Disorder - Mild (296.21)  AXIS I:  Benzodiazepine dependence, opiate abuse, SIMDO AXIS II:  Deferred AXIS III:   Past Medical History  Diagnosis Date  . Depression   . Bipolar 1 disorder   . Anxiety   . Drug abuse   . ETOH abuse   . Polysubstance abuse    AXIS IV:  occupational problems, problems related to social environment and problems with primary support group AXIS V:  41-50 serious symptoms  Treatment Plan/Recommendations:   1. Admit for crisis management and stabilization. 2. Medication management to reduce current symptoms to base line and improve the patient's overall level of functioning. 3. Treat health problems as indicated. 4. Develop treatment plan to decrease risk of relapse upon discharge and to reduce the need for readmission. 5. Psycho-social education regarding relapse prevention and self care. 6. Health care follow up as needed for medical problems. 7. Restart home medications where appropriate. 8. Detox protocol for opiates and Benzodiazepine withdrawal. 9. Move to [address] when bed is available.  Treatment Plan Summary: Daily contact with patient to assess and evaluate symptoms and progress in treatment Medication management Current Medications:  Current Facility-Administered Medications  Medication Dose Route Frequency Provider Last Rate Last Dose  . acetaminophen (TYLENOL) tablet 650 mg  650 mg Oral Q6H PRN Lurena Nida, NP   650 mg at 11/01/13 0309  . carbamazepine (TEGRETOL XR) 12 hr tablet 200 mg  200 mg Oral BID Lurena Nida, NP   200 mg at 11/01/13 1719  .  dicyclomine (BENTYL)  tablet 20 mg  20 mg Oral Q6H PRN Nena Polio, PA-C   20 mg at 11/01/13 1724  . FLUoxetine (PROZAC) capsule 20 mg  20 mg Oral Daily Lurena Nida, NP   20 mg at 11/01/13 6950  . hydrOXYzine (ATARAX/VISTARIL) tablet 25 mg  25 mg Oral Q6H PRN Nena Polio, PA-C   25 mg at 11/01/13 1008  . loperamide (IMODIUM) capsule 2-4 mg  2-4 mg Oral PRN Nena Polio, PA-C      . LORazepam (ATIVAN) tablet 0-4 mg  0-4 mg Oral 4 times per day Lurena Nida, NP   2 mg at 11/01/13 1159   Followed by  . [START ON 11/03/2013] LORazepam (ATIVAN) tablet 0-4 mg  0-4 mg Oral Q12H Lurena Nida, NP      . magnesium hydroxide (MILK OF MAGNESIA) suspension 30 mL  30 mL Oral Daily PRN Lurena Nida, NP      . magnesium hydroxide (MILK OF MAGNESIA) suspension 30 mL  30 mL Oral Daily PRN Nena Polio, PA-C      . methocarbamol (ROBAXIN) tablet 500 mg  500 mg Oral Q8H PRN Nena Polio, PA-C   500 mg at 11/01/13 1008  . naproxen (NAPROSYN) tablet 500 mg  500 mg Oral BID PRN Nena Polio, PA-C   500 mg at 11/01/13 1507  . OLANZapine zydis (ZYPREXA) disintegrating tablet 10 mg  10 mg Oral BID Lurena Nida, NP   10 mg at 11/01/13 7225  . ondansetron (ZOFRAN-ODT) disintegrating tablet 4 mg  4 mg Oral Q6H PRN Nena Polio, PA-C   4 mg at 11/01/13 1507  . pantoprazole (PROTONIX) EC tablet 40 mg  40 mg Oral Daily Lurena Nida, NP   40 mg at 11/01/13 7505  . traZODone (DESYREL) tablet 100 mg  100 mg Oral QHS PRN,MR X 1 Lurena Nida, NP        Observation Level/Precautions:  detox  Laboratory:  reviewed  Psychotherapy:  groups  Medications:   As written  Consultations:  If needed  Discharge Concerns:  Not at this time  Estimated LOS:3-5 days  Other:     I certify that inpatient services furnished can reasonably be expected to improve the patient's condition.   Marlane Hatcher. [place] RPAC 5:52 PM 11/01/2013 I have examined the patient and agreed with the findings of H&P and treatment plan. I have also done  suicide assesment.
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
## 2013-11-01 NOTE — [facility] Group Notes (Addendum)
[facility] Group Notes:  (Nursing/MHT/Case Management/Adjunct)  Date:  11/01/2013  Time:  3:25 PM  Type of Therapy:  Psychoeducational Skills  Participation Level:  Active  Participation Quality:  Appropriate  Affect:  Appropriate  Cognitive:  Appropriate  Insight:  Appropriate  Engagement in Group:  Engaged  Modes of Intervention:  Discussion  Summary of Progress/Problems: Pt did attend healthy coping skills , pt reported that she was negative SI/HI, no AH/VH noted. Pt rated her depression as a 7, and her helplessness/hopelessness as a 6.     Pt reported concerns about severe anxiety and agitation, patient made aware that doctor will be informed.   [name], [name] 11/01/2013, 3:25 PM

## 2013-11-01 NOTE — BH Assessment (Signed)
Received a call for a tele-assessment. Spoke with Dr. Ranae PalmsYelverton who reported that patient has a history of polysubstance abuse. No SI reported. Pt's last inpatient detox was in 2014. Pt is requesting detox. Assessment will be initiated.

## 2013-11-01 NOTE — ED Notes (Signed)
TTS talking with pt by satilite

## 2013-11-01 NOTE — Progress Notes (Signed)
Patient ID: [name], female   DOB: 07/01/1980, 33 y.o.   MRN: [id number] [facility] Polysubstance Dependence and depression.  Pt alert, oriented, anxious but cooperative. Pt reported that she uses benzodiazepines, alcohol, heroin and marijuana. [facility] substance [facility]. Denies SI/HI, -A/V hall. Verbally contracts for safety. Pt has a long psychiatric history with multiple hospitalizations and treatments at rehab facilities. Pt c/o anxiety, depression, insomnia and irritability. [facility] that pt is med seeking, reluctant to being hosipitalized and encouragement provided by sponsor. Pt will be monitored closely and evaluated for stabilization.

## 2013-11-02 DIAGNOSIS — F1320 Sedative, hypnotic or anxiolytic dependence, uncomplicated: Principal | ICD-10-CM

## 2013-11-02 DIAGNOSIS — F1910 Other psychoactive substance abuse, uncomplicated: Secondary | ICD-10-CM

## 2013-11-02 DIAGNOSIS — F3110 Bipolar disorder, current episode manic without psychotic features, unspecified: Secondary | ICD-10-CM

## 2013-11-02 MED ORDER — LORAZEPAM 1 MG PO TABS
1.0000 mg | ORAL_TABLET | Freq: Every day | ORAL | Status: AC
  Administered 2013-11-05: 1 mg via ORAL
  Filled 2013-11-02: qty 1

## 2013-11-02 MED ORDER — NICOTINE 21 MG/24HR TD PT24
21.0000 mg | MEDICATED_PATCH | Freq: Every day | TRANSDERMAL | Status: DC
  Administered 2013-11-02 – 2013-11-05 (×4): 21 mg via TRANSDERMAL
  Filled 2013-11-02 (×5): qty 1

## 2013-11-02 MED ORDER — HYDROXYZINE HCL 25 MG PO TABS
25.0000 mg | ORAL_TABLET | Freq: Four times a day (QID) | ORAL | Status: DC | PRN
  Administered 2013-11-02 – 2013-11-03 (×2): 25 mg via ORAL
  Filled 2013-11-02 (×2): qty 1

## 2013-11-02 MED ORDER — CLONIDINE HCL 0.1 MG PO TABS
0.1000 mg | ORAL_TABLET | Freq: Four times a day (QID) | ORAL | Status: AC
  Administered 2013-11-02 – 2013-11-04 (×8): 0.1 mg via ORAL
  Filled 2013-11-02 (×11): qty 1

## 2013-11-02 MED ORDER — LORAZEPAM 1 MG PO TABS
1.0000 mg | ORAL_TABLET | Freq: Three times a day (TID) | ORAL | Status: AC
  Administered 2013-11-03 (×3): 1 mg via ORAL
  Filled 2013-11-02 (×3): qty 1

## 2013-11-02 MED ORDER — CLONIDINE HCL 0.1 MG PO TABS: 0.1000 mg | ORAL_TABLET | Freq: Every day | ORAL | Status: DC

## 2013-11-02 MED ORDER — VITAMIN B-1 100 MG PO TABS
100.0000 mg | ORAL_TABLET | Freq: Every day | ORAL | Status: DC
  Administered 2013-11-03 – 2013-11-05 (×3): 100 mg via ORAL
  Filled 2013-11-02 (×5): qty 1

## 2013-11-02 MED ORDER — ADULT MULTIVITAMIN W/MINERALS CH
1.0000 | ORAL_TABLET | Freq: Every day | ORAL | Status: DC
  Administered 2013-11-02 – 2013-11-05 (×4): 1 via ORAL
  Filled 2013-11-02 (×6): qty 1

## 2013-11-02 MED ORDER — LORAZEPAM 1 MG PO TABS
1.0000 mg | ORAL_TABLET | Freq: Four times a day (QID) | ORAL | Status: AC | PRN
  Administered 2013-11-02 – 2013-11-05 (×9): 1 mg via ORAL
  Filled 2013-11-02 (×8): qty 1

## 2013-11-02 MED ORDER — LORAZEPAM 1 MG PO TABS
1.0000 mg | ORAL_TABLET | Freq: Two times a day (BID) | ORAL | Status: AC
  Administered 2013-11-04 (×2): 1 mg via ORAL
  Filled 2013-11-02 (×3): qty 1

## 2013-11-02 MED ORDER — LORAZEPAM 1 MG PO TABS
1.0000 mg | ORAL_TABLET | Freq: Four times a day (QID) | ORAL | Status: AC
  Administered 2013-11-02 (×3): 1 mg via ORAL
  Filled 2013-11-02 (×2): qty 1

## 2013-11-02 MED ORDER — CLONIDINE HCL 0.1 MG PO TABS
0.1000 mg | ORAL_TABLET | ORAL | Status: DC
  Administered 2013-11-04 – 2013-11-05 (×2): 0.1 mg via ORAL
  Filled 2013-11-02 (×4): qty 1

## 2013-11-02 NOTE — [facility] Group Notes (Signed)
[facility] LCSW Group Therapy  11/02/2013 4:15 PM  Type of Therapy:  Group Therapy  Participation Level:  Active  Participation Quality:  Sharing and Supportive  Affect:  Appropriate  Cognitive:  Oriented  Insight:  Developing/Improving  Engagement in Therapy:  Developing/Improving  Modes of Intervention:  Discussion, Education, Exploration, Rapport Building, Socialization and Support  Summary of Progress/Problems: Pt was engaged and was able to support others sharing in the group.  Pt did not express or identify any behaviors, however was engaged in conversation showed by body language.   Seabron SpatesVaughn, Wandalene Abrams Anne 11/02/2013, 4:15 PM

## 2013-11-02 NOTE — Progress Notes (Signed)
[facility] Group Notes:  (Nursing/MHT/Case Management/Adjunct)  Date:  11/02/2013  Time:  10:55 PM  Type of Therapy:  Psychoeducational Skills  Participation Level:  Active  Participation Quality:  Appropriate  Affect:  Labile  Cognitive:  Appropriate  Insight:  Appropriate  Engagement in Group:  Developing/Improving  Modes of Intervention:  Education  Summary of Progress/Problems: The patient was very honest in group this evening as she described why she had a bad day. The patient mentioned that her emotions have been "up and down" all day long. In addition, she spoke of having difficulty with the withdrawal symptoms. As a goal for tomorrow, the patient would like to work on controlling her anger and finding a more constructive way of dealing with situations.   Hazle CocaGOODMAN, Candance Bohlman S 11/02/2013, 10:55 PM
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
## 2013-11-02 NOTE — Progress Notes (Signed)
Writer spoke with patient at medication window and she appeared agitated. Writer informed her of scheduled 2000 med and she informed Clinical research associatewriter of her having a difficult day. She reported that she felt that her withdrawal symptoms are worst today than yesterday. Writer spoke with patient about reasons/causes for her being so upset. She informed Clinical research associatewriter of incident that happened earlier. Writer encouraged patient to focus on her treatment and not get side tracked with other distractions. She received a prn ativan and was encouraged to attend group on 500 d/t conflict with a few patients on 300 hall. Support and encouragement given, patient attended group. Safety maintained on unit with 15 min checks.

## 2013-11-02 NOTE — Progress Notes (Signed)
Psychoeducational Group Note  Date: 11/02/2013 Time: 1015  Group Topic/Focus:  Making Healthy Choices:   The focus of this group is to help patients identify negative/unhealthy choices they were using prior to admission and identify positive/healthier coping strategies to replace them upon discharge.  Participation Level:  Active  Participation Quality:  Appropriate  Affect:  Appropriate  Cognitive:  Appropriate  Insight:  Engaged  Engagement in Group:  Engaged  Additional Comments:    11/02/2013,11:11 AM Kateri Mcuke, Joie BimlerPatricia Lynn

## 2013-11-03 DIAGNOSIS — F1120 Opioid dependence, uncomplicated: Secondary | ICD-10-CM

## 2013-11-03 MED ORDER — HYDROXYZINE HCL 50 MG PO TABS
50.0000 mg | ORAL_TABLET | Freq: Four times a day (QID) | ORAL | Status: AC | PRN
Start: 2013-11-03 — End: 2013-11-05
  Administered 2013-11-03 – 2013-11-05 (×3): 50 mg via ORAL
  Filled 2013-11-03 (×4): qty 1

## 2013-11-03 NOTE — Progress Notes (Addendum)
D:  Patient's self inventory sheet, patient slept good last night, sleep medication was helpful.  Appetite up and down.  High energy level, poor concentration.  Rated depression 4, hopeless 2, anxiety 9.  Withdrawals include tremors, cravings, cramping, agitation, nausea, irritable.  Denied SI today, SI in the past, contracts for safety, no plan.  Has had blurred vision, sees hazy/mist occasionally.  Has experienced muscle spasms today.  Will work on Pharmacologistcoping skills today.  Plans to return home after discharge.  [facility] or will need financial assistance with medications. A:  Medications administered per MD orders.  Emotional support and encouragement given patient. R:  Denied SI and HI, contracts for safety.  Does sees haze/mist at times.  Denied verbal hallucinations.  Safety maintained with 15 minute checks.  Patient has continually been asking about medications this morning, scheduled and prn's.  Gave patient a list of all medications given and scheduled today and also list of prn's.  Patient confused, continued to ask about medications with paper in her hand, apologized that she could not remember or understood what she had been told.  Patient complained of dry mouth, fluids/ice given patient throughout morning.  Much encouragement given patient to continue with her detox and to work with MD/staff.  Patient stated she could not go back on street and use again, knew that she would die.   1700  Pt's BP on dinamap was 161/130 P 80.   Patient's scheduled 1700 medications were given, 30 minutes later BP 110/80 P86 sitting left arm; BP 98/72 P86 standing Right arm.   Patient stated she was feeling better, stated she could feel the medicine "kick in".  NP aware of BP and medications.  Patient had been very anxious and became calmer after receiving her medications.

## 2013-11-03 NOTE — Progress Notes (Signed)
Patient stated she needed her second ativan 1 mg prn for severe anxiety.  Stated she did not want to go off and cause problems on the unit.  Stated she would like to attend AA/NA meetings on 300 hall.  Stated she regretted that she got upset last night with someone on 300 hall.

## 2013-11-03 NOTE — Progress Notes (Signed)
Pt attended spiritual care group on grief and loss facilitated by chaplain Burnis KingfisherMatthew Linday Rhodes. Group opened with brief discussion and psycho-social ed around grief and loss in relationships and in relation to self - identifying life patterns, circumstances, changes that cause losses. Established group norm of speaking from own life experience. Group goal of establishing open and affirming space for members to share loss and experience with grief, normalize grief experience and provide psycho social education and grief support.  Lorene DyChristie was present throughout group.  She was somewhat restless and anxious at moments.  Chaplain noticed this and Lorene DyChristie spoke with group about hearing other's stories as both helpful in that she feels less alone, and difficult in that she recalls her own grief.    Belva CromeStalnaker, Jaycub Noorani Wayne MDiv

## 2013-11-03 NOTE — Progress Notes (Signed)
NUTRITION ASSESSMENT  Pt identified as at risk on the Malnutrition Screen Tool  INTERVENTION: Educated patient on the importance of nutrition and encouraged intake of food and beverages.  NUTRITION DIAGNOSIS: Unintentional weight loss related to sub-optimal intake as evidenced by pt report.   Goal: Pt to meet >/= 90% of their estimated nutrition needs.  Monitor:  PO intake  Assessment:  Patient notes that she returned to [place] [facility] 3 days ago after being out of it for 1 1/2 years. She made the decision to get clean and came to the hospital. Abusing benzodiazepines, alcohol (12 pack/day), THC, and heroin.   - Met with pt who reports on/off intake but poor appetite for the past 2 weeks - States she's lost 15 pounds unintentionally in the past 2 months - Reports still not eating great in the hospital but "enough" to not need any nutritional supplements    33 y.o. female  Height: Ht Readings from Last 1 Encounters:  11/01/13 5' 2"  (1.575 m)    Weight: Wt Readings from Last 1 Encounters:  11/01/13 202 lb 8 oz (91.853 kg)    Weight Hx: Wt Readings from Last 10 Encounters:  11/01/13 202 lb 8 oz (91.853 kg)  02/20/13 197 lb (89.359 kg)  02/20/13 205 lb (92.987 kg)  02/16/13 203 lb (92.08 kg)  11/09/12 195 lb (88.451 kg)  11/22/11 205 lb (92.987 kg)  11/21/11 205 lb (92.987 kg)    BMI:  Body mass index is 37.03 kg/(m^2). Pt meets criteria for class II obesity based on current BMI.  Estimated Nutritional Needs: Kcal: 15-20 kcal/kg Protein: > 1 gram protein/kg Fluid: 1 ml/kcal  Diet Order: General Pt is also offered choice of unit snacks mid-morning and mid-afternoon.  Pt is eating as desired.   Lab results and medications reviewed. Getting multivitamin and thiamine.   Carlis Stable MS, Corinne, LDN [phone] Pager [phone] Weekend/After Hours Pager

## 2013-11-03 NOTE — Progress Notes (Signed)
Chaplain followed up with [name] in afternoon.  Denise DyChristie spoke with chaplain about desire to cease using heroin and other substances.  Spoke briefly about specific losses in her life (grandmother at 34y/o, mother's mental health) and how she used substances to cope with these.  Stated that she was fearful of stepping into this grief without coping through substances.  Finds support in N/A and A/A groups and spoke of a church community where she felt at home over a number of years.  [name]'s faith is a resource in her life and she spoke with chaplain about areas of "newness" and finding a new identity outside of drugs.   Chaplain will continue to follow for support during admission.   [name], [name] Wayne MDiv

## 2013-11-03 NOTE — Progress Notes (Signed)
Patient ID: [name], female   DOB: 17-Feb-1981, 33 y.o.   MRN: [id number] [facility] MD Progress Note  11/03/2013 12:10 PM Avigail Pilling  MRN:  [id number] Subjective:  Patient seen and chart reviewed. Pt denies SI, HI, and AVH, contracts for safety but affirms very high anxiety of 9/10 and depression at 7/10. Pt reports that she feels her medications are not working and is asking for Ativan. Later, after this conversation, pt reports that she feels great after taking her clonidine and that it is working well. Increased Vistaril to 45m.   Objective: Patient is up and active on the unit. Still has some w/d sx, and appears more agitated. Moods are up and down, Cows is coming down appropriately. She is definitely restless today, but her behavior is appropriate to the situation.  Diagnosis:  Benzo dependence DSM5: Schizophrenia Disorders:   Obsessive-Compulsive Disorders:   Trauma-Stressor Disorders:   Substance/Addictive Disorders:  Opioid Disorder - Severe (304.00) Depressive Disorders:   Total Time spent with patient: 25 minutes    ADL's:  Intact  Sleep: Fair  Appetite:  Fair  Suicidal Ideation:  denies Homicidal Ideation:  denies AEB (as evidenced by):  Psychiatric Specialty Exam: Physical Exam  Review of Systems  Constitutional: Negative.   HENT: Negative.   Eyes: Negative.   Respiratory: Negative.   Cardiovascular: Negative.   Gastrointestinal: Negative.   Genitourinary: Negative.   Musculoskeletal: Negative.   Skin: Negative.   Neurological: Negative.   Endo/Heme/Allergies: Negative.   Psychiatric/Behavioral: Positive for depression. The patient is nervous/anxious and has insomnia.     Blood pressure 119/78, pulse 109, temperature 97.6 F (36.4 C), temperature source Oral, resp. rate 18, height 5' 2" (1.575 m), weight 91.853 kg (202 lb 8 oz), last menstrual period 10/24/2013, SpO2 100.00%.Body mass index is 37.03 kg/(m^2).  General Appearance: Casual   Eye Contact::  Good  Speech:  Clear and Coherent  Volume:  Normal  Mood:  Anxious  Affect:  Congruent and restless  Thought Process:  Goal Directed  Orientation:  Full (Time, Place, and Person)  Thought Content:  WDL  Suicidal Thoughts:  No  Homicidal Thoughts:  No  Memory:  NA  Judgement:  Good  Insight:  Good  Psychomotor Activity:  Restlessness  Concentration:  Good  Recall:  Good  Fund of Knowledge:Good  Language: Good  Akathisia:  No  Handed:  Left  AIMS (if indicated):     Assets:  Communication Skills Desire for Improvement Resilience Social Support  Sleep:  Number of Hours: 6.75   Musculoskeletal: Strength & Muscle Tone: within normal limits Gait & Station: normal Patient leans: na  Current Medications: Current Facility-Administered Medications  Medication Dose Route Frequency Provider Last Rate Last Dose  . acetaminophen (TYLENOL) tablet 650 mg  650 mg Oral Q6H PRN FLurena Nida NP   650 mg at 11/01/13 0309  . carbamazepine (TEGRETOL XR) 12 hr tablet 200 mg  200 mg Oral BID FLurena Nida NP   200 mg at 11/02/13 0806  . dicyclomine (BENTYL) tablet 20 mg  20 mg Oral Q6H PRN NNena Polio PA-C   20 mg at 11/02/13 0401  . FLUoxetine (PROZAC) capsule 20 mg  20 mg Oral Daily FLurena Nida NP   20 mg at 11/02/13 08916 . hydrOXYzine (ATARAX/VISTARIL) tablet 25 mg  25 mg Oral Q6H PRN INicholaus Bloom MD      . loperamide (IMODIUM) capsule 2-4 mg  2-4 mg Oral PRN NNena Polio  PA-C      . LORazepam (ATIVAN) tablet 1 mg  1 mg Oral Q6H PRN Nicholaus Bloom, MD   1 mg at 11/02/13 1454  . LORazepam (ATIVAN) tablet 1 mg  1 mg Oral QID Nicholaus Bloom, MD   1 mg at 11/02/13 1126   Followed by  . [START ON 11/03/2013] LORazepam (ATIVAN) tablet 1 mg  1 mg Oral TID Nicholaus Bloom, MD       Followed by  . [START ON 11/04/2013] LORazepam (ATIVAN) tablet 1 mg  1 mg Oral BID Nicholaus Bloom, MD       Followed by  . [START ON 11/05/2013] LORazepam (ATIVAN) tablet 1 mg  1 mg Oral Daily  Nicholaus Bloom, MD      . magnesium hydroxide (MILK OF MAGNESIA) suspension 30 mL  30 mL Oral Daily PRN Nena Polio, PA-C      . methocarbamol (ROBAXIN) tablet 500 mg  500 mg Oral Q8H PRN Nena Polio, PA-C   500 mg at 11/02/13 1454  . multivitamin with minerals tablet 1 tablet  1 tablet Oral Daily Nicholaus Bloom, MD      . naproxen (NAPROSYN) tablet 500 mg  500 mg Oral BID PRN Nena Polio, PA-C   500 mg at 11/02/13 1454  . nicotine (NICODERM CQ - dosed in mg/24 hours) patch 21 mg  21 mg Transdermal Daily Nicholaus Bloom, MD   21 mg at 11/02/13 0809  . OLANZapine zydis (ZYPREXA) disintegrating tablet 10 mg  10 mg Oral BID Lurena Nida, NP   10 mg at 11/02/13 7341  . ondansetron (ZOFRAN-ODT) disintegrating tablet 4 mg  4 mg Oral Q6H PRN Nena Polio, PA-C   4 mg at 11/01/13 1507  . pantoprazole (PROTONIX) EC tablet 40 mg  40 mg Oral Daily Lurena Nida, NP   40 mg at 11/02/13 0807  . [START ON 11/03/2013] thiamine (VITAMIN B-1) tablet 100 mg  100 mg Oral Daily Nicholaus Bloom, MD      . traZODone (DESYREL) tablet 100 mg  100 mg Oral QHS PRN,MR X 1 Lurena Nida, NP   100 mg at 11/01/13 2216    Lab Results:  Results for orders placed during the hospital encounter of 10/31/13 (from the past 48 hour(s))  CARBAMAZEPINE LEVEL, TOTAL     Status: Abnormal   Collection Time    10/31/13 11:39 PM      Result Value Ref Range   Carbamazepine Lvl <0.5 (*) 4.0 - 12.0 ug/mL   Comment: Performed at [facility]  CBC WITH DIFFERENTIAL     Status: None   Collection Time    10/31/13 11:45 PM      Result Value Ref Range   WBC 8.9  4.0 - 10.5 K/uL   RBC 4.34  3.87 - 5.11 MIL/uL   Hemoglobin 14.5  12.0 - 15.0 g/dL   HCT 42.6  36.0 - 46.0 %   MCV 98.2  78.0 - 100.0 fL   MCH 33.4  26.0 - 34.0 pg   MCHC 34.0  30.0 - 36.0 g/dL   RDW 13.0  11.5 - 15.5 %   Platelets 341  150 - 400 K/uL   Neutrophils Relative % 54  43 - 77 %   Neutro Abs 4.8  1.7 - 7.7 K/uL   Lymphocytes Relative 37  12 - 46 %   Lymphs  Abs 3.3  0.7 - 4.0 K/uL   Monocytes Relative  9  3 - 12 %   Monocytes Absolute 0.8  0.1 - 1.0 K/uL   Eosinophils Relative 1  0 - 5 %   Eosinophils Absolute 0.1  0.0 - 0.7 K/uL   Basophils Relative 0  0 - 1 %   Basophils Absolute 0.0  0.0 - 0.1 K/uL  COMPREHENSIVE METABOLIC PANEL     Status: Abnormal   Collection Time    10/31/13 11:45 PM      Result Value Ref Range   Sodium 141  137 - 147 mEq/L   Potassium 4.2  3.7 - 5.3 mEq/L   Chloride 102  96 - 112 mEq/L   CO2 23  19 - 32 mEq/L   Glucose, Bld 105 (*) 70 - 99 mg/dL   BUN 13  6 - 23 mg/dL   Creatinine, Ser 0.70  0.50 - 1.10 mg/dL   Calcium 10.0  8.4 - 10.5 mg/dL   Total Protein 8.2  6.0 - 8.3 g/dL   Albumin 4.5  3.5 - 5.2 g/dL   AST 18  0 - 37 U/L   ALT 24  0 - 35 U/L   Alkaline Phosphatase 77  39 - 117 U/L   Total Bilirubin 0.3  0.3 - 1.2 mg/dL   GFR calc non Af Amer >90  >90 mL/min   GFR calc Af Amer >90  >90 mL/min   Comment: (NOTE)     The eGFR has been calculated using the CKD EPI equation.     This calculation has not been validated in all clinical situations.     eGFR's persistently <90 mL/min signify possible Chronic Kidney     Disease.   Anion gap 16 (*) 5 - 15  ETHANOL     Status: None   Collection Time    10/31/13 11:45 PM      Result Value Ref Range   Alcohol, Ethyl (B) <11  0 - 11 mg/dL   Comment:            LOWEST DETECTABLE LIMIT FOR     SERUM ALCOHOL IS 11 mg/dL     FOR MEDICAL PURPOSES ONLY  URINE RAPID DRUG SCREEN (HOSP PERFORMED)     Status: Abnormal   Collection Time    10/31/13 11:45 PM      Result Value Ref Range   Opiates NONE DETECTED  NONE DETECTED   Cocaine NONE DETECTED  NONE DETECTED   Benzodiazepines POSITIVE (*) NONE DETECTED   Amphetamines NONE DETECTED  NONE DETECTED   Tetrahydrocannabinol POSITIVE (*) NONE DETECTED   Barbiturates NONE DETECTED  NONE DETECTED   Comment:            DRUG SCREEN FOR MEDICAL PURPOSES     ONLY.  IF CONFIRMATION IS NEEDED     FOR ANY PURPOSE, NOTIFY LAB      WITHIN 5 DAYS.                LOWEST DETECTABLE LIMITS     FOR URINE DRUG SCREEN     Drug Class       Cutoff (ng/mL)     Amphetamine      1000     Barbiturate      200     Benzodiazepine   729     Tricyclics       021     Opiates          300     Cocaine  300     THC              50  URINALYSIS, ROUTINE W REFLEX MICROSCOPIC     Status: Abnormal   Collection Time    10/31/13 11:45 PM      Result Value Ref Range   Color, Urine YELLOW  YELLOW   APPearance CLOUDY (*) CLEAR   Specific Gravity, Urine 1.024  1.005 - 1.030   pH 5.0  5.0 - 8.0   Glucose, UA NEGATIVE  NEGATIVE mg/dL   Hgb urine dipstick MODERATE (*) NEGATIVE   Bilirubin Urine NEGATIVE  NEGATIVE   Ketones, ur NEGATIVE  NEGATIVE mg/dL   Protein, ur NEGATIVE  NEGATIVE mg/dL   Urobilinogen, UA 0.2  0.0 - 1.0 mg/dL   Nitrite NEGATIVE  NEGATIVE   Leukocytes, UA NEGATIVE  NEGATIVE  PREGNANCY, URINE     Status: None   Collection Time    10/31/13 11:45 PM      Result Value Ref Range   Preg Test, Ur NEGATIVE  NEGATIVE   Comment:            THE SENSITIVITY OF THIS     METHODOLOGY IS >20 mIU/mL.  URINE MICROSCOPIC-ADD ON     Status: Abnormal   Collection Time    10/31/13 11:45 PM      Result Value Ref Range   Squamous Epithelial / LPF MANY (*) RARE   WBC, UA 0-2  <3 WBC/hpf   RBC / HPF 0-2  <3 RBC/hpf   Bacteria, UA FEW (*) RARE    Physical Findings: AIMS: Facial and Oral Movements Muscles of Facial Expression: None, normal Lips and Perioral Area: None, normal Jaw: None, normal Tongue: None, normal,Extremity Movements Upper (arms, wrists, hands, fingers): None, normal Lower (legs, knees, ankles, toes): None, normal, Trunk Movements Neck, shoulders, hips: None, normal, Overall Severity Severity of abnormal movements (highest score from questions above): None, normal Incapacitation due to abnormal movements: None, normal Patient's awareness of abnormal movements (rate only patient's report): No  Awareness, Dental Status Current problems with teeth and/or dentures?: No Does patient usually wear dentures?: No  CIWA:  CIWA-Ar Total: 11 COWS:  COWS Total Score: 12  Treatment Plan Summary: Daily contact with patient to assess and evaluate symptoms and progress in treatment Medication management  Plan: 1. Continue crisis management and stabilization. 2. Medication management to reduce current symptoms to base line and improve patient's overall level of functioning 3. Treat health problems as indicated. 4. Develop treatment plan to decrease risk of relapse upon discharge and the need for     readmission. 5. Psycho-social education regarding relapse prevention and self care. 6. Health care follow up as needed for medical problems. 7. Continue home medications where appropriate.  *Increase Vistaril to 72m for anxiety  Medical Decision Making Problem Points:  Established problem, stable/improving (1) Data Points:  Review of medication regiment & side effects (2)  I certify that inpatient services furnished can reasonably be expected to improve the patient's condition.   JBenjamine Mola FNP-BC 12:10 PM 11/03/2013

## 2013-11-03 NOTE — [facility] Group Notes (Signed)
[facility] LCSW Group Therapy  11/03/2013 1:15pm   Type of Therapy: Group Therapy- Overcoming Obstacles  Participation Level: Active   Participation Quality:  Monopolizing  Affect:  Flat   Cognitive: Alert and Oriented   Insight:  Developing/Improving   Engagement in Therapy: Developing/Improving and Engaged   Modes of Intervention: Clarification, Confrontation, Discussion, Education, Exploration, Limit-setting, Orientation, Problem-solving, Rapport Building, Dance movement psychotherapisteality Testing, Socialization and Support   Description of Group:   In this group patients will be encouraged to explore what they see as obstacles to their own wellness and recovery. They will be guided to discuss their thoughts, feelings, and behaviors related to these obstacles. The group will process together ways to cope with barriers, with attention given to specific choices patients can make. Each patient will be challenged to identify changes they are motivated to make in order to overcome their obstacles. This group will be process-oriented, with patients participating in exploration of their own experiences as well as giving and receiving support and challenge from other group members. Pt was monopolizing during group conversation, however her comments were relevant and appropriate.  Pt exhibited a fast rate of speech and poor attention and concentration.  Pt identified several obstacles in her wellness and sobriety including: lack of support, difficulty asking for help, family members who continue to use, and broken trust.  Pt described the importance of willpower and "just doing what she needs to do" to overcome these obstacles.  In order to overcome the lack of support obstacle, Pt reports that she plans to be more active in AA and NA, introducing herself to more members and reaching out when she needs help.     Therapeutic Modalities:   Cognitive Behavioral Therapy Solution Focused Therapy Motivational Interviewing Relapse  Prevention Therapy    Chad CordialLauren Carter, LCSWA 11/03/2013 3:06 PM

## 2013-11-03 NOTE — [facility] Group Notes (Signed)
[facility]  11/03/2013 8:45 AM  Participation Quality: Alert, Appropriate and Oriented  Mood/Affect: "okay" mood; flat and depressed affect  Depression Rating: 5  Anxiety Rating: 9  Thoughts of Suicide: Pt denies SI/HI  Will you contract for safety? Yes  Current AVH: Pt denies  Plan for Discharge/Comments: Pt attended discharge planning group and actively participated in group. CSW provided pt with today's workbook. Pt reports increased anxiety due to withdrawal symptoms and underlying anxiety disorder.  Pt reports that she slept well last night.  Pt follows up an [facility] and can return home at discharge.  Pt reports her daily goal as wanting to learn something new during groups.  Transportation Means: Pt reports access to transportation  Supports: No supports mentioned at this time  Chad CordialLauren Carter, LCSWA 11/03/2013 10:07 AM

## 2013-11-04 DIAGNOSIS — F411 Generalized anxiety disorder: Secondary | ICD-10-CM

## 2013-11-04 MED ORDER — LORAZEPAM 1 MG PO TABS
1.0000 mg | ORAL_TABLET | Freq: Once | ORAL | Status: AC
  Administered 2013-11-04: 1 mg via ORAL

## 2013-11-04 MED ORDER — FLUOXETINE HCL 20 MG PO CAPS
40.0000 mg | ORAL_CAPSULE | Freq: Every day | ORAL | Status: DC
  Administered 2013-11-05: 40 mg via ORAL
  Filled 2013-11-04 (×2): qty 2

## 2013-11-04 MED ORDER — LORAZEPAM 1 MG PO TABS
ORAL_TABLET | ORAL | Status: AC
Start: 2013-11-04 — End: 2013-11-04
  Filled 2013-11-04: qty 1

## 2013-11-04 NOTE — Progress Notes (Signed)
[name] reports she slept about 5 hours. States she feels better than yesterday . She request p.r.n. Ativan for anxiety before breakfast. Cooperative and less irritable.

## 2013-11-04 NOTE — Progress Notes (Signed)
Attended group on 300 NA

## 2013-11-04 NOTE — Progress Notes (Signed)
At the beginning of the shift, pt was sitting in a chair in the hallway crying.  Pt had received a call from her girlfriend who told her that the patient's parents had been in a MVA, but that she did not know how they were.  Pt was feeling helpless as she was in the hospital and could not be with them to help.  Staff spent time with her until she was able to find out more information.  Pt states she wants to be discharged tomorrow.  She denies SI/HI/AV.  She says she has attended some groups, but she did not go to evening group.  Pt has been appropriate with writer this evening.  She received an extra dose of Ativan 1 mg per MD order for her anxiety.  Pt makes her needs known to staff.  Support and encouragement offered.  Safety maintained with q15 minute checks.

## 2013-11-04 NOTE — [facility] Group Notes (Signed)
[facility] LCSW Group Therapy      Feelings About Diagnosis 1:15 - 2:30 PM         11/04/2013    Type of Therapy:  Group Therapy  Participation Level:  Active  Participation Quality:  Appropriate  Affect: Depressed  Cognitive:  Alert and Appropriate  Insight:  Developing/Improving and Engaged  Engagement in Therapy:  Developing/Improving and Engaged  Modes of Intervention:  Discussion, Education, Exploration, Problem-Solving, Rapport Building, Support  Summary of Progress/Problems:  Patient actively participated in group. Patient discussed past and present diagnosis and the effects it has had on  life.  Patient talked about family and society being judgmental and the stigma associated with having a mental health diagnosis.  Patient shared she feels like a piece of "crap" because of her diagnosis. She talked about how she has used her money to buy drug rather than pay her rent.  Patient able to state that she can do better if she follows up with outpatient services at discharge.  Wynn BankerHodnett, Helyn Schwan Hairston 11/04/2013

## 2013-11-04 NOTE — Tx Team (Signed)
Interdisciplinary Treatment Plan Update   Date Reviewed:  11/04/2013  Time Reviewed:  9:00 AM  Progress in Treatment:   Attending groups: Yes Participating in groups: Yes Taking medication as prescribed: Yes  Tolerating medication: Yes Family/Significant other contact made:  No, but will ask patient for consent for collateral contact Patient understands diagnosis: Yes  Discussing patient identified problems/goals with staff: Yes Medical problems stabilized or resolved: Yes Denies suicidal/homicidal ideation: Yes Patient has not harmed self or others: Yes  For review of initial/current patient goals, please see plan of care.  Estimated Length of Stay:  3-5 days  Reasons for Continued Hospitalization:  Anxiety Depression Medication stabilization  New Problems/Goals identified:    Discharge Plan or Barriers:   Home with outpatient follow up to be determined  Additional Comments:  Attendees:  Patient:  11/04/2013 9:00 AM   Signature:  Sallyanne HaversF. Cobos, MD 11/04/2013 9:00 AM  Signature:  11/04/2013 9:00 AM  Signature:   11/04/2013 9:00 AM  Signature:Beverly Terrilee CroakKnight, RN 11/04/2013 9:00 AM  Signature:  Neill Loftarol Davis RN 11/04/2013 9:00 AM  Signature:  Horace PorteousQuylle Maleiah Dula, LCSW 11/04/2013 9:00 AM  Signature:  Chad CordialLauren Carter LCSW-A 11/04/2013 9:00 AM  Signature:  Leisa LenzValerie Enoch, Care Coordinator [facility] 11/04/2013 9:00 AM  Signature:  11/04/2013 9:00 AM  Signature:  11/04/2013  9:00 AM  Signature:   Onnie BoerJennifer Clark, RN [facility]CM 11/04/2013  9:00 AM  Signature: 11/04/2013  9:00 AM    Scribe for Treatment Team:   Juline PatchQuylle Kelcey Wickstrom,  11/04/2013 9:00 AM

## 2013-11-04 NOTE — Progress Notes (Signed)
D: Patient denies SI/HI/AVH. Patient rates hopelessness as 8,  depression as 4, and anxiety as 5.  Patient affect is sad. Mood is anxious and depressed. Patient states, "I've had anxiety and irritability.  I'm aggravated and irritated as hell.  I did good though.  I didn't go off on anybody or punch any walls."  Patient did attend evening group. Patient visible on the milieu. No distress noted. A: Support and encouragement offered. Scheduled medications given to pt. Q 15 min checks continued for patient safety. R: Patient receptive. Patient remains safe on the unit.

## 2013-11-04 NOTE — Progress Notes (Signed)
Recreation Therapy Notes  Animal-Assisted Activity/Therapy (AAA/T) Program Checklist/Progress Notes Patient Eligibility Criteria Checklist & Daily Group note for Rec Tx Intervention  Date: 07.28.2015 Time: 2:45pm Location: [address] Dayroom    AAA/T Program Assumption of Risk Form signed by Patient/ or Parent Legal Guardian yes  Patient is free of allergies or sever asthma yes  Patient reports no fear of animals yes  Patient reports no history of cruelty to animals yes   Patient understands his/her participation is voluntary yes  Patient washes hands before animal contact yes  Patient washes hands after animal contact yes  Behavioral Response: Appropriate   Education: Hand Washing, Appropriate Animal Interaction   Education Outcome: Acknowledges understanding   Clinical Observations/Feedback: Patient interacted appropriately with therapeutic dog team and peers during session.   Domenic Schoenberger L Kazaria Gaertner, LRT/CTRS  Tareek Sabo L 11/04/2013 4:36 PM 
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
## 2013-11-04 NOTE — Progress Notes (Signed)
The focus of this group is to educate the patient on the purpose and policies of crisis stabilization and provide a format to answer questions about their admission.  The group details unit policies and expectations of patients while admitted.  Patient attended morning nurse education orientation group.  Patient actively participated, appropriate affect, alert, appropriate insight and engagement.  Today patient will work on 3 goals for discharge.  

## 2013-11-04 NOTE — Progress Notes (Addendum)
Patient ID: [name], female   DOB: 11/29/1980, 33 y.o.   MRN: [id number] [facility] MD Progress Note  11/04/2013 11:18 AM [name]  MRN:  [id number] Subjective:  Patient reports partial improvement. Reports chronic and ongoing difficulties with anxiety.  Objective: 33 year old woman who has a history of substance dependence, particularly  BZD dependence, but also Alcohol and  Cannabis. States that she remains anxious, and describes long history of anxiety, which she describes as panic and some degree of agoraphobia, as well as some social phobia symptoms. At this time she is not presenting with tremors or diaphoresis or any acute distress, but does feel subjectively anxious and jittery. Vitals are stable (+) cravings for BZDs and states she feels that " they are the only medications that work for my anxiety".  Responds well to support , encouragement , and review of rationale why to avoid BZDs as long term treatment in her case, due to abuse potential.  Diagnosis:  Benzo dependence, Anxiety Disorder NOS, consider Social Phobia  Total Time spent with patient: 25 minutes    ADL's:  Intact  Sleep: Fair  Appetite:  Fair  Suicidal Ideation:  denies Homicidal Ideation:  denies AEB (as evidenced by):  Psychiatric Specialty Exam: Physical Exam  Review of Systems  Constitutional: Negative.   HENT: Negative.   Eyes: Negative.   Respiratory: Negative.   Cardiovascular: Negative.   Gastrointestinal: Negative.   Genitourinary: Negative.   Musculoskeletal: Negative.   Skin: Negative.   Neurological: Negative.   Endo/Heme/Allergies: Negative.   Psychiatric/Behavioral: Positive for depression. The patient is nervous/anxious and has insomnia.     Blood pressure 109/80, pulse 96, temperature 97.6 F (36.4 C), temperature source Oral, resp. rate 17, height _0  (1.575 m), weight 91.853 kg (202 lb 8 oz), last menstrual period 10/24/2013, SpO2 100.00%.Body mass index  is 37.03 kg/(m^2).  General Appearance: Casual  Eye Contact::  Good  Speech:  Clear and Coherent  Volume:  Normal  Mood:  Anxious  Affect:  Mildly anxious, but reactive   Thought Process:  Goal Directed  Orientation:  Full (Time, Place, and Person)  Thought Content:  Denies hallucinations, no delusions  Suicidal Thoughts:  No- currently no suicidal or homicidal ideations  Homicidal Thoughts:  No  Memory:  NA  Judgement:  Good  Insight:  Good  Psychomotor Activity:  Restlessness  Concentration:  Good  Recall:  Good  Fund of Knowledge:Good  Language: Good  Akathisia:  No  Handed:  Left  AIMS (if indicated):     Assets:  Communication Skills Desire for Improvement Resilience Social Support  Sleep:  Number of Hours: 6.5   Musculoskeletal: Strength & Muscle Tone: within normal limits Gait & Station: normal Patient leans: na  Current Medications: Current Facility-Administered Medications  Medication Dose Route Frequency Provider Last Rate Last Dose  . acetaminophen (TYLENOL) tablet 650 mg  650 mg Oral Q6H PRN Lurena Nida, NP   650 mg at 11/01/13 0309  . carbamazepine (TEGRETOL XR) 12 hr tablet 200 mg  200 mg Oral BID Lurena Nida, NP   200 mg at 11/02/13 0806  . dicyclomine (BENTYL) tablet 20 mg  20 mg Oral Q6H PRN Nena Polio, PA-C   20 mg at 11/02/13 0401  . FLUoxetine (PROZAC) capsule 20 mg  20 mg Oral Daily Lurena Nida, NP   20 mg at 11/02/13 0806  . hydrOXYzine (ATARAX/VISTARIL) tablet 25 mg  25 mg Oral Q6H PRN Woodroe Chen  Sabra Heck, MD      . loperamide (IMODIUM) capsule 2-4 mg  2-4 mg Oral PRN Nena Polio, PA-C      . LORazepam (ATIVAN) tablet 1 mg  1 mg Oral Q6H PRN Nicholaus Bloom, MD   1 mg at 11/02/13 1454  . LORazepam (ATIVAN) tablet 1 mg  1 mg Oral QID Nicholaus Bloom, MD   1 mg at 11/02/13 1126   Followed by  . [START ON 11/03/2013] LORazepam (ATIVAN) tablet 1 mg  1 mg Oral TID Nicholaus Bloom, MD       Followed by  . [START ON 11/04/2013] LORazepam (ATIVAN) tablet 1  mg  1 mg Oral BID Nicholaus Bloom, MD       Followed by  . [START ON 11/05/2013] LORazepam (ATIVAN) tablet 1 mg  1 mg Oral Daily Nicholaus Bloom, MD      . magnesium hydroxide (MILK OF MAGNESIA) suspension 30 mL  30 mL Oral Daily PRN Nena Polio, PA-C      . methocarbamol (ROBAXIN) tablet 500 mg  500 mg Oral Q8H PRN Nena Polio, PA-C   500 mg at 11/02/13 1454  . multivitamin with minerals tablet 1 tablet  1 tablet Oral Daily Nicholaus Bloom, MD      . naproxen (NAPROSYN) tablet 500 mg  500 mg Oral BID PRN Nena Polio, PA-C   500 mg at 11/02/13 1454  . nicotine (NICODERM CQ - dosed in mg/24 hours) patch 21 mg  21 mg Transdermal Daily Nicholaus Bloom, MD   21 mg at 11/02/13 0809  . OLANZapine zydis (ZYPREXA) disintegrating tablet 10 mg  10 mg Oral BID Lurena Nida, NP   10 mg at 11/02/13 0630  . ondansetron (ZOFRAN-ODT) disintegrating tablet 4 mg  4 mg Oral Q6H PRN Nena Polio, PA-C   4 mg at 11/01/13 1507  . pantoprazole (PROTONIX) EC tablet 40 mg  40 mg Oral Daily Lurena Nida, NP   40 mg at 11/02/13 0807  . [START ON 11/03/2013] thiamine (VITAMIN B-1) tablet 100 mg  100 mg Oral Daily Nicholaus Bloom, MD      . traZODone (DESYREL) tablet 100 mg  100 mg Oral QHS PRN,MR X 1 Lurena Nida, NP   100 mg at 11/01/13 2216    Lab Results:  Results for orders placed during the hospital encounter of 10/31/13 (from the past 48 hour(s))  CARBAMAZEPINE LEVEL, TOTAL     Status: Abnormal   Collection Time    10/31/13 11:39 PM      Result Value Ref Range   Carbamazepine Lvl <0.5 (*) 4.0 - 12.0 ug/mL   Comment: Performed at [facility]     Status: None   Collection Time    10/31/13 11:45 PM      Result Value Ref Range   WBC 8.9  4.0 - 10.5 K/uL   RBC 4.34  3.87 - 5.11 MIL/uL   Hemoglobin 14.5  12.0 - 15.0 g/dL   HCT 42.6  36.0 - 46.0 %   MCV 98.2  78.0 - 100.0 fL   MCH 33.4  26.0 - 34.0 pg   MCHC 34.0  30.0 - 36.0 g/dL   RDW 13.0  11.5 - 15.5 %   Platelets 341  150 -  400 K/uL   Neutrophils Relative % 54  43 - 77 %   Neutro Abs 4.8  1.7 - 7.7 K/uL   Lymphocytes Relative  37  12 - 46 %   Lymphs Abs 3.3  0.7 - 4.0 K/uL   Monocytes Relative 9  3 - 12 %   Monocytes Absolute 0.8  0.1 - 1.0 K/uL   Eosinophils Relative 1  0 - 5 %   Eosinophils Absolute 0.1  0.0 - 0.7 K/uL   Basophils Relative 0  0 - 1 %   Basophils Absolute 0.0  0.0 - 0.1 K/uL  COMPREHENSIVE METABOLIC PANEL     Status: Abnormal   Collection Time    10/31/13 11:45 PM      Result Value Ref Range   Sodium 141  137 - 147 mEq/L   Potassium 4.2  3.7 - 5.3 mEq/L   Chloride 102  96 - 112 mEq/L   CO2 23  19 - 32 mEq/L   Glucose, Bld 105 (*) 70 - 99 mg/dL   BUN 13  6 - 23 mg/dL   Creatinine, Ser 0.70  0.50 - 1.10 mg/dL   Calcium 10.0  8.4 - 10.5 mg/dL   Total Protein 8.2  6.0 - 8.3 g/dL   Albumin 4.5  3.5 - 5.2 g/dL   AST 18  0 - 37 U/L   ALT 24  0 - 35 U/L   Alkaline Phosphatase 77  39 - 117 U/L   Total Bilirubin 0.3  0.3 - 1.2 mg/dL   GFR calc non Af Amer >90  >90 mL/min   GFR calc Af Amer >90  >90 mL/min   Comment: (NOTE)     The eGFR has been calculated using the CKD EPI equation.     This calculation has not been validated in all clinical situations.     eGFR's persistently <90 mL/min signify possible Chronic Kidney     Disease.   Anion gap 16 (*) 5 - 15  ETHANOL     Status: None   Collection Time    10/31/13 11:45 PM      Result Value Ref Range   Alcohol, Ethyl (B) <11  0 - 11 mg/dL   Comment:            LOWEST DETECTABLE LIMIT FOR     SERUM ALCOHOL IS 11 mg/dL     FOR MEDICAL PURPOSES ONLY  URINE RAPID DRUG SCREEN (HOSP PERFORMED)     Status: Abnormal   Collection Time    10/31/13 11:45 PM      Result Value Ref Range   Opiates NONE DETECTED  NONE DETECTED   Cocaine NONE DETECTED  NONE DETECTED   Benzodiazepines POSITIVE (*) NONE DETECTED   Amphetamines NONE DETECTED  NONE DETECTED   Tetrahydrocannabinol POSITIVE (*) NONE DETECTED   Barbiturates NONE DETECTED  NONE  DETECTED   Comment:            DRUG SCREEN FOR MEDICAL PURPOSES     ONLY.  IF CONFIRMATION IS NEEDED     FOR ANY PURPOSE, NOTIFY LAB     WITHIN 5 DAYS.                LOWEST DETECTABLE LIMITS     FOR URINE DRUG SCREEN     Drug Class       Cutoff (ng/mL)     Amphetamine      1000     Barbiturate      200     Benzodiazepine   001     Tricyclics       749     Opiates  300     Cocaine          300     THC              50  URINALYSIS, ROUTINE W REFLEX MICROSCOPIC     Status: Abnormal   Collection Time    10/31/13 11:45 PM      Result Value Ref Range   Color, Urine YELLOW  YELLOW   APPearance CLOUDY (*) CLEAR   Specific Gravity, Urine 1.024  1.005 - 1.030   pH 5.0  5.0 - 8.0   Glucose, UA NEGATIVE  NEGATIVE mg/dL   Hgb urine dipstick MODERATE (*) NEGATIVE   Bilirubin Urine NEGATIVE  NEGATIVE   Ketones, ur NEGATIVE  NEGATIVE mg/dL   Protein, ur NEGATIVE  NEGATIVE mg/dL   Urobilinogen, UA 0.2  0.0 - 1.0 mg/dL   Nitrite NEGATIVE  NEGATIVE   Leukocytes, UA NEGATIVE  NEGATIVE  PREGNANCY, URINE     Status: None   Collection Time    10/31/13 11:45 PM      Result Value Ref Range   Preg Test, Ur NEGATIVE  NEGATIVE   Comment:            THE SENSITIVITY OF THIS     METHODOLOGY IS >20 mIU/mL.  URINE MICROSCOPIC-ADD ON     Status: Abnormal   Collection Time    10/31/13 11:45 PM      Result Value Ref Range   Squamous Epithelial / LPF MANY (*) RARE   WBC, UA 0-2  <3 WBC/hpf   RBC / HPF 0-2  <3 RBC/hpf   Bacteria, UA FEW (*) RARE    Physical Findings: AIMS: Facial and Oral Movements Muscles of Facial Expression: None, normal Lips and Perioral Area: None, normal Jaw: None, normal Tongue: None, normal,Extremity Movements Upper (arms, wrists, hands, fingers): None, normal Lower (legs, knees, ankles, toes): None, normal, Trunk Movements Neck, shoulders, hips: None, normal, Overall Severity Severity of abnormal movements (highest score from questions above): None,  normal Incapacitation due to abnormal movements: None, normal Patient's awareness of abnormal movements (rate only patient's report): No Awareness, Dental Status Current problems with teeth and/or dentures?: No Does patient usually wear dentures?: No  CIWA:  CIWA-Ar Total: 2 COWS:  COWS Total Score: 6  Assessment: At this time patient is improving, but remains anxious and somewhat depressed. No severe WDL symptoms currently and vitals are stable.   Treatment Plan Summary: Daily contact with patient to assess and evaluate symptoms and progress in treatment Medication management  Plan: 1. Continue crisis management and stabilization. Continue Prozac 20 mgrs daily, continue Tegretol 200 mgrs BID,  continue Ativan detoxification protocol to minimize risk of BZD WDL, and Clonidine detoxification protocol due to recent opiate abuse .  We reviewed side effects, to include carbamazepine teratogenic effects/risk.  Of note patient reports recent unprotected sexual activity prior to admission and is worried that she might be pregnant, although admission pregnancy test was negative. She also requests HIV and Viral Hepatitis testing. I explained that based on seroconversion latency, obtaining labs at this time would establish a base line but should be followed with retesting in several weeks by her outpatient provider- she expressed understanding. Encourage patient to consider inpatient rehabilitation program after discharge as a disposition option. Will follow     Medical Decision Making Problem Points:  Established problem, stable/improving (1) Data Points:  Review or order clinical lab tests (1) Review of medication regiment & side effects (2)  I  certify that inpatient services furnished can reasonably be expected to improve the patient's condition.   Benjamine Mola, FNP-BC 11:18 AM 11/04/2013

## 2013-11-05 DIAGNOSIS — F1220 Cannabis dependence, uncomplicated: Secondary | ICD-10-CM

## 2013-11-05 DIAGNOSIS — F3160 Bipolar disorder, current episode mixed, unspecified: Secondary | ICD-10-CM

## 2013-11-05 DIAGNOSIS — F1020 Alcohol dependence, uncomplicated: Secondary | ICD-10-CM

## 2013-11-05 LAB — HCG, SERUM, QUALITATIVE: Preg, Serum: NEGATIVE

## 2013-11-05 LAB — HIV ANTIBODY (ROUTINE TESTING W REFLEX): HIV 1&2 Ab, 4th Generation: NONREACTIVE

## 2013-11-05 LAB — HEPATITIS C ANTIBODY: HCV Ab: NEGATIVE

## 2013-11-05 LAB — HEPATITIS B SURFACE ANTIGEN: Hepatitis B Surface Ag: NEGATIVE

## 2013-11-05 MED ORDER — OMEPRAZOLE 20 MG PO CPDR: 20.0000 mg | DELAYED_RELEASE_CAPSULE | Freq: Every day | ORAL | Status: DC

## 2013-11-05 MED ORDER — PANTOPRAZOLE SODIUM 40 MG PO TBEC: 40.0000 mg | DELAYED_RELEASE_TABLET | Freq: Every day | ORAL | Status: DC

## 2013-11-05 MED ORDER — METHOCARBAMOL 500 MG PO TABS: 500.0000 mg | ORAL_TABLET | Freq: Three times a day (TID) | ORAL | Status: DC | PRN

## 2013-11-05 MED ORDER — FLUOXETINE HCL 40 MG PO CAPS: 40.0000 mg | ORAL_CAPSULE | Freq: Every day | ORAL | Status: DC

## 2013-11-05 MED ORDER — TRAZODONE HCL 100 MG PO TABS: 100.0000 mg | ORAL_TABLET | Freq: Every evening | ORAL | Status: DC | PRN

## 2013-11-05 MED ORDER — OLANZAPINE 10 MG PO TBDP: 10.0000 mg | ORAL_TABLET | Freq: Two times a day (BID) | ORAL | Status: DC

## 2013-11-05 MED ORDER — CARBAMAZEPINE ER 200 MG PO TB12: 200.0000 mg | ORAL_TABLET | Freq: Two times a day (BID) | ORAL | Status: DC

## 2013-11-05 NOTE — Progress Notes (Signed)
Discharge Note: Discharge instructions and prescriptions given to patient. Patient verbalized understanding of discharge instructions and prescriptions. Returned belongings to patient. Denies SI/HI/AVH. Patient d/c without incident to the lobby and transported home by her roommate.
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
## 2013-11-06 NOTE — Progress Notes (Signed)
[facility] :  Late Entry for 01/06/14  Will you be returning to the same living situation after discharge: Yes,  Patient returned to her home. At discharge, do you have transportation home?:Yes,  Patient arranged transportation. Do you have the ability to pay for your medications:No.  Patient needs assistance with indigent medications   Release of information consent forms completed and in the chart;  Patient's signature needed at discharge.  Patient to Follow up at: Follow-up Information   [facility] ([facility] go to [facility] walk [facility] on Tuesday, November 11, 2013 or any weekday between 8AM - 12:00 or 1:00-3PM for medication/counseling)    Contact information:   [address] [place], [place]   [id number]  [phone]      Patient denies SI/HI: Patient will no longer endorse SI/other thought self harm    Safety Planning and Suicide Prevention discussed: .Reviewed with all patients during discharge planning group   Avaiyah Strubel, Joesph JulyQuylle Hairston 11/06/2013, 9:23 AM

## 2013-11-10 NOTE — Progress Notes (Signed)
Patient Discharge Instructions:  After Visit Summary (AVS):   Faxed to:  11/10/13 Psychiatric Admission Assessment Note:   Faxed to:  11/10/13 Suicide Risk Assessment - Discharge Assessment:   Faxed to:  11/10/13 Faxed/Sent to the Next Level Care provider:  11/10/13 [facility] to [facility] @ [phone] Denise ReddenSheena E [place], 11/10/2013, 3:12 PM
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
## 2013-11-23 ENCOUNTER — Emergency Department (HOSPITAL_COMMUNITY)
Admission: EM | Admit: 2013-11-23 | Discharge: 2013-11-23 | Discharge status: Against Medical Advice | Payer: PRIVATE HEALTH INSURANCE | Attending: Emergency Medicine | Admitting: Emergency Medicine

## 2013-11-23 ENCOUNTER — Encounter (HOSPITAL_COMMUNITY): Payer: Self-pay | Admitting: Emergency Medicine

## 2013-11-23 DIAGNOSIS — R000 Tachycardia, unspecified: Secondary | ICD-10-CM | POA: Insufficient documentation

## 2013-11-23 DIAGNOSIS — F1310 Sedative, hypnotic or anxiolytic abuse, uncomplicated: Secondary | ICD-10-CM | POA: Insufficient documentation

## 2013-11-23 DIAGNOSIS — F1010 Alcohol abuse, uncomplicated: Secondary | ICD-10-CM | POA: Insufficient documentation

## 2013-11-23 DIAGNOSIS — F1910 Other psychoactive substance abuse, uncomplicated: Secondary | ICD-10-CM

## 2013-11-23 DIAGNOSIS — F17200 Nicotine dependence, unspecified, uncomplicated: Secondary | ICD-10-CM | POA: Insufficient documentation

## 2013-11-23 NOTE — ED Provider Notes (Signed)
CSN: [id number]     Arrival date & time 11/23/13  0122 History   First MD Initiated Contact with Patient 11/23/13 0133     Chief Complaint  Patient presents with  . Alcohol Intoxication     (Consider location/radiation/quality/duration/timing/severity/associated sxs/prior Treatment) HPI Comments: Pt is a 33 y/o female who was brought in by EMS with cc of altered mental status. Per Police, who escorted patient to the ER, pt was noted to be slumping, and confused, and couldn't walk when they arrived. Friend had placed the EMS call. Pt is currently aox3, but appears to be under influence. She admits to me that she used some "xanax and stuff" and smoked cracked and frank alcohol. She recalls EMS coming to pick her up. She is not sure why EMS was called to help. Pt has no si/hi/psychoses or hallucinations.   Patient is a 33 y.o. female presenting with intoxication. The history is provided by the patient.  Alcohol Intoxication Pertinent negatives include no chest pain, no abdominal pain, no headaches and no shortness of breath.    Past Medical History  Diagnosis Date  . Depression   . Bipolar 1 disorder   . Anxiety   . Drug abuse   . ETOH abuse   . Polysubstance abuse    History reviewed. No pertinent past surgical history. No family history on file. History  Substance Use Topics  . Smoking status: Current Every Day Smoker -- 2.00 packs/day for 17 years    Types: Cigarettes  . Smokeless tobacco: Never Used  . Alcohol Use: 7.2 oz/week    12 Cans of beer per week     Comment: 12 pack of beer per day plus liquor or wine, whatever is available and fits in her pocket at work   OB History   Grav Para Term Preterm Abortions TAB SAB Ect Mult Living                 Review of Systems  Constitutional: Positive for activity change.  Respiratory: Negative for shortness of breath.   Cardiovascular: Negative for chest pain.  Gastrointestinal: Negative for nausea, vomiting and abdominal  pain.  Genitourinary: Negative for dysuria.  Neurological: Negative for headaches.  Psychiatric/Behavioral: Positive for confusion. Negative for suicidal ideas, hallucinations and self-injury.      Allergies  Review of patient's allergies indicates no known allergies.  Home Medications   Prior to Admission medications   Medication Sig Start Date End Date Taking? Authorizing Provider  carbamazepine (TEGRETOL XR) 200 MG 12 hr tablet Take 1 tablet (200 mg total) by mouth 2 (two) times daily. 11/05/13   Beau FannyJohn C Withrow, FNP  FLUoxetine (PROZAC) 40 MG capsule Take 1 capsule (40 mg total) by mouth daily. 11/05/13   Beau FannyJohn C Withrow, FNP  methocarbamol (ROBAXIN) 500 MG tablet Take 1 tablet (500 mg total) by mouth every 8 (eight) hours as needed for muscle spasms. 11/05/13   Beau FannyJohn C Withrow, FNP  OLANZapine zydis (ZYPREXA) 10 MG disintegrating tablet Take 1 tablet (10 mg total) by mouth 2 (two) times daily. 11/05/13   Beau FannyJohn C Withrow, FNP  omeprazole (PRILOSEC) 20 MG capsule Take 1 capsule (20 mg total) by mouth daily. 11/05/13   Beau FannyJohn C Withrow, FNP  traZODone (DESYREL) 100 MG tablet Take 1 tablet (100 mg total) by mouth at bedtime as needed and may repeat dose one time if needed for sleep. 11/05/13   Everardo AllJohn C Withrow, FNP   BP 129/75  Pulse 115  Temp(Src) 98.2  F (36.8 C) (Oral)  Ht 5\' 2"  (1.575 m)  Wt 207 lb (93.895 kg)  BMI 37.85 kg/m2  SpO2 96%  LMP 11/23/2013 Physical Exam  Nursing note and vitals reviewed. Constitutional: She is oriented to person, place, and time. She appears well-developed and well-nourished.  HENT:  Head: Normocephalic and atraumatic.  Eyes: EOM are normal. Pupils are equal, round, and reactive to light.  Pupils are 4 mm and equal  Neck: Neck supple.  Cardiovascular: Regular rhythm and normal heart sounds.   Pulmonary/Chest: Effort normal. No respiratory distress.  Abdominal: Soft. She exhibits no distension. There is no tenderness. There is no rebound and no guarding.   Neurological: She is alert and oriented to person, place, and time.  GCS - 15  Skin: Skin is warm and dry.    ED Course  Procedures (including critical care time) Labs Review Labs Reviewed  CBC WITH DIFFERENTIAL  ACETAMINOPHEN LEVEL  URINE RAPID DRUG SCREEN (HOSP PERFORMED)  COMPREHENSIVE METABOLIC PANEL    Imaging Review No results found.   EKG Interpretation None      MDM   Final diagnoses:  None  Polysubstance abuse  Pt comes in with suspected overdose and resultant AMS.  Pt was on the phone when i went to see her, trying to reach her friend. Pt is aox 3, GCS 15, and was able to cooperate with the hx and exam.  Plan was to get EKG, as she had used crack and other substances and was tachycardic, and observe in the ER prior to discharge - but it appears that patient left prior to the evaluation as done.    Derwood Kaplan, MD 11/23/13 [phone]

## 2013-11-23 NOTE — ED Notes (Signed)
Patient here with EMS after them being called regarding possible patient overdose. Patient openly admits to poly substance abuse and states use of cocaine, heroine, ETOH, and xanax today. EMS reported that patient was drowsy and slow to respond upon EMS arrival but recovered en route. Patient A+O x4 upon arrival, communicating needs, and ambulatory. Denies SI/HI/AVH.

## 2013-11-28 ENCOUNTER — Encounter (HOSPITAL_BASED_OUTPATIENT_CLINIC_OR_DEPARTMENT_OTHER): Payer: Self-pay | Admitting: Emergency Medicine

## 2013-11-28 ENCOUNTER — Emergency Department (HOSPITAL_BASED_OUTPATIENT_CLINIC_OR_DEPARTMENT_OTHER)
Admission: EM | Admit: 2013-11-28 | Discharge: 2013-11-28 | Disposition: A | Discharge status: Home / Self Care | Payer: PRIVATE HEALTH INSURANCE | Attending: Emergency Medicine | Admitting: Emergency Medicine

## 2013-11-28 ENCOUNTER — Emergency Department (HOSPITAL_COMMUNITY)
Admission: EM | Admit: 2013-11-28 | Discharge: 2013-11-28 | Discharge status: Against Medical Advice | Payer: PRIVATE HEALTH INSURANCE | Attending: Emergency Medicine | Admitting: Emergency Medicine

## 2013-11-28 ENCOUNTER — Encounter (HOSPITAL_COMMUNITY): Payer: Self-pay | Admitting: Emergency Medicine

## 2013-11-28 DIAGNOSIS — F1910 Other psychoactive substance abuse, uncomplicated: Secondary | ICD-10-CM

## 2013-11-28 DIAGNOSIS — F1110 Opioid abuse, uncomplicated: Secondary | ICD-10-CM | POA: Insufficient documentation

## 2013-11-28 DIAGNOSIS — F1210 Cannabis abuse, uncomplicated: Secondary | ICD-10-CM | POA: Insufficient documentation

## 2013-11-28 DIAGNOSIS — Z008 Encounter for other general examination: Secondary | ICD-10-CM | POA: Insufficient documentation

## 2013-11-28 DIAGNOSIS — F411 Generalized anxiety disorder: Secondary | ICD-10-CM | POA: Insufficient documentation

## 2013-11-28 DIAGNOSIS — F1310 Sedative, hypnotic or anxiolytic abuse, uncomplicated: Secondary | ICD-10-CM | POA: Insufficient documentation

## 2013-11-28 DIAGNOSIS — F17200 Nicotine dependence, unspecified, uncomplicated: Secondary | ICD-10-CM | POA: Insufficient documentation

## 2013-11-28 DIAGNOSIS — F1010 Alcohol abuse, uncomplicated: Secondary | ICD-10-CM | POA: Insufficient documentation

## 2013-11-28 DIAGNOSIS — F319 Bipolar disorder, unspecified: Secondary | ICD-10-CM | POA: Insufficient documentation

## 2013-11-28 DIAGNOSIS — Z3202 Encounter for pregnancy test, result negative: Secondary | ICD-10-CM | POA: Insufficient documentation

## 2013-11-28 DIAGNOSIS — Z79899 Other long term (current) drug therapy: Secondary | ICD-10-CM | POA: Insufficient documentation

## 2013-11-28 LAB — CBC WITH DIFFERENTIAL/PLATELET
Basophils Absolute: 0 10*3/uL (ref 0.0–0.1)
Basophils Relative: 0 % (ref 0–1)
Eosinophils Absolute: 0 10*3/uL (ref 0.0–0.7)
Eosinophils Relative: 0 % (ref 0–5)
HEMATOCRIT: 41.3 % (ref 36.0–46.0)
Hemoglobin: 14.3 g/dL (ref 12.0–15.0)
LYMPHS PCT: 27 % (ref 12–46)
Lymphs Abs: 2.6 10*3/uL (ref 0.7–4.0)
MCH: 33.7 pg (ref 26.0–34.0)
MCHC: 34.6 g/dL (ref 30.0–36.0)
MCV: 97.4 fL (ref 78.0–100.0)
MONO ABS: 0.6 10*3/uL (ref 0.1–1.0)
Monocytes Relative: 6 % (ref 3–12)
Neutro Abs: 6.4 10*3/uL (ref 1.7–7.7)
Neutrophils Relative %: 66 % (ref 43–77)
Platelets: 377 10*3/uL (ref 150–400)
RBC: 4.24 MIL/uL (ref 3.87–5.11)
RDW: 12.5 % (ref 11.5–15.5)
WBC: 9.6 10*3/uL (ref 4.0–10.5)

## 2013-11-28 LAB — COMPREHENSIVE METABOLIC PANEL
ALK PHOS: 89 U/L (ref 39–117)
ALT: 25 U/L (ref 0–35)
ANION GAP: 19 — AB (ref 5–15)
AST: 20 U/L (ref 0–37)
Albumin: 4.1 g/dL (ref 3.5–5.2)
BILIRUBIN TOTAL: 0.3 mg/dL (ref 0.3–1.2)
BUN: 7 mg/dL (ref 6–23)
CO2: 19 meq/L (ref 19–32)
CREATININE: 0.6 mg/dL (ref 0.50–1.10)
Calcium: 9.9 mg/dL (ref 8.4–10.5)
Chloride: 101 mEq/L (ref 96–112)
GFR calc Af Amer: >90 mL/min (ref >90)
GFR calc non Af Amer: >90 mL/min (ref >90)
Glucose, Bld: 107 mg/dL — ABNORMAL HIGH (ref 70–99)
POTASSIUM: 3.9 meq/L (ref 3.7–5.3)
Sodium: 139 mEq/L (ref 137–147)
Total Protein: 8.1 g/dL (ref 6.0–8.3)

## 2013-11-28 LAB — URINALYSIS, ROUTINE W REFLEX MICROSCOPIC
BILIRUBIN URINE: NEGATIVE
GLUCOSE, UA: NEGATIVE mg/dL
KETONES UR: NEGATIVE mg/dL
Leukocytes, UA: NEGATIVE
Nitrite: NEGATIVE
PROTEIN: NEGATIVE mg/dL
Specific Gravity, Urine: 1.003 — ABNORMAL LOW (ref 1.005–1.030)
Urobilinogen, UA: 0.2 mg/dL (ref 0.0–1.0)
pH: 6 (ref 5.0–8.0)

## 2013-11-28 LAB — RAPID URINE DRUG SCREEN, HOSP PERFORMED
AMPHETAMINES: NOT DETECTED
BARBITURATES: NOT DETECTED
Benzodiazepines: NOT DETECTED
COCAINE: NOT DETECTED
OPIATES: NOT DETECTED
TETRAHYDROCANNABINOL: POSITIVE — AB

## 2013-11-28 LAB — URINE MICROSCOPIC-ADD ON

## 2013-11-28 LAB — PREGNANCY, URINE: Preg Test, Ur: NEGATIVE

## 2013-11-28 LAB — ETHANOL: Alcohol, Ethyl (B): 26 mg/dL — ABNORMAL HIGH (ref 0–11)

## 2013-11-28 MED ORDER — LORAZEPAM 1 MG PO TABS: 0.0000 mg | ORAL_TABLET | Freq: Two times a day (BID) | ORAL | Status: DC

## 2013-11-28 MED ORDER — ONDANSETRON 4 MG PO TBDP
4.0000 mg | ORAL_TABLET | Freq: Once | ORAL | Status: AC
  Administered 2013-11-28: 4 mg via ORAL
  Filled 2013-11-28: qty 1

## 2013-11-28 MED ORDER — NICOTINE 21 MG/24HR TD PT24
21.0000 mg | MEDICATED_PATCH | Freq: Once | TRANSDERMAL | Status: DC
  Administered 2013-11-28: 21 mg via TRANSDERMAL
  Filled 2013-11-28: qty 1

## 2013-11-28 MED ORDER — LORAZEPAM 1 MG PO TABS
0.0000 mg | ORAL_TABLET | Freq: Four times a day (QID) | ORAL | Status: DC
  Administered 2013-11-28 (×2): 2 mg via ORAL
  Filled 2013-11-28 (×2): qty 2

## 2013-11-28 MED ORDER — LORAZEPAM 1 MG PO TABS
1.0000 mg | ORAL_TABLET | Freq: Once | ORAL | Status: AC
  Administered 2013-11-28: 1 mg via ORAL
  Filled 2013-11-28: qty 1

## 2013-11-28 NOTE — BH Assessment (Signed)
[facility] Assessment Progress Note  [facility] @ [facility]32, nurse in ED, and scheduled pt's tele assessment with this clinician for 0745.  [facility] from [facility] @ [phone].  Casimer LaniusKristen Ladarian Bonczek, MS, [facility] Licensed Professional Counselor Triage Specialist
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
## 2013-11-28 NOTE — ED Notes (Signed)
Pt talking with tele psych

## 2013-11-28 NOTE — ED Notes (Signed)
Pt is requesting to sign out AMA. Expl[facility] to pt [facility] Dr Littie DeedsGentry in to see pt

## 2013-11-28 NOTE — ED Notes (Signed)
Pt given ice chips

## 2013-11-28 NOTE — ED Notes (Signed)
PT at nursing station asking for ink pen to write phone # down.

## 2013-11-28 NOTE — ED Notes (Signed)
[facility]  Inquired regarding bed assignment.  No beds available at this time.  Informed him that the pt is wanting to sign out AMA.  Dr. Littie DeedsGentry notified.

## 2013-11-28 NOTE — ED Notes (Signed)
Pt observed taking her BP again while bouncing her body infront of monitor. This RN went in room to take pt BP. Pt wants to know when her room will be available. Instructed unsure at this time. All BP cuffs locked in cabinets and and all cabinets in room locked.

## 2013-11-28 NOTE — ED Notes (Signed)
Explained to pt about talking with Tele psych personal about clonidine. Pt verbilized understanding.

## 2013-11-28 NOTE — ED Notes (Signed)
Pt was alert, speaking in full, coherent sentences, and walked out of department w/o diffculty or assistance.

## 2013-11-28 NOTE — ED Notes (Addendum)
Pt presents intoxicated wanting detox from ETOH, heroin, benzos, and marijuana and would like R foot assessed for multiple complaints.  [facility] and per notes, decided to leave.  Pt reports "that told me that I would have to come here anyway."   Pt admits to drinking several types of ETOH and taking up to "15 Xanax."  Pt could not give me a time of last ingestion.  However, ingestion was prior to [facility].  Denies SI, HI and hallucinations.  Pt would not initially answer the question regarding SI and HI.  This RN informed her that we would have to assume that she was having SI thoughts, if she did not answer. Pt asked if she [facility] straight [facility] and was informed that she would probably go back to the [facility] first.  Pt stated that she wants to leave.  This RN informed her that she could not leave if she was suicidal. Pt began getting very agitated and stated "I never said anything and if you put that in there, then you are lying.  I'm not suicidal, I'm not suicidal, I'm not suicidal.  I don't trust you people.  You all hate me."  This RN informed the Pt that she did not know her.  Pt began cussing loudly.  This RN asked the Pt to stop cussing.  The Pt yelled "f*ck." and proceeded to call this RN a "b*tch."  This RN walked out of the room.  Pt, then, decided to leave and was followed out by M. Hamby RN.

## 2013-11-28 NOTE — BH Assessment (Signed)
[facility] Assessment Progress Note  Update:  See note below by Glorious PeachNajah Presley, MS, LCASA.  [facility] by Dr. Lucianne MussKumar @ 1150, but decided to leave ED per note in EPIC and stated [facility].  Will update staff there.  Casimer LaniusKristen Teyona Nichelson, MS, [facility] Licensed Professional Counselor Triage Specialist

## 2013-11-28 NOTE — BH Assessment (Signed)
Called and spoke with  ED nurse Asencion IslamMarva who informed this writer that patient changed her mind about getting treatment and pt informed nurse that [facility]. [facility].   Glorious PeachNajah Lulubelle Simcoe, MS, LCASA Assessment Counselor

## 2013-11-28 NOTE — ED Provider Notes (Signed)
CSN: [id number]     Arrival date & time 11/28/13  0252 History   First MD Initiated Contact with Patient 11/28/13 0316     Chief Complaint  Patient presents with  . Medical Clearance     (Consider location/radiation/quality/duration/timing/severity/associated sxs/prior Treatment) HPI Patient presents for polysubstance abuse and wanting to detox. Was recently admitted to inpatient detox. She states she's been using benzodiazepines, heroin, alcohol and cocaine today. She denies homicidal or suicidal ideation. Denies nausea, vomiting, diarrhea. She's ambulatory without assistance. Past Medical History  Diagnosis Date  . Depression   . Bipolar 1 disorder   . Anxiety   . Drug abuse   . ETOH abuse   . Polysubstance abuse    History reviewed. No pertinent past surgical history. History reviewed. No pertinent family history. History  Substance Use Topics  . Smoking status: Current Every Day Smoker -- 2.00 packs/day for 17 years    Types: Cigarettes  . Smokeless tobacco: Never Used  . Alcohol Use: 7.2 oz/week    12 Cans of beer per week     Comment: 12 pack of beer per day plus liquor or wine, whatever is available and fits in her pocket at work   OB History   Grav Para Term Preterm Abortions TAB SAB Ect Mult Living                 Review of Systems  Constitutional: Negative for fever and chills.  Respiratory: Negative for shortness of breath.   Cardiovascular: Negative for chest pain.  Gastrointestinal: Negative for nausea, vomiting, abdominal pain and diarrhea.  Skin: Negative for rash and wound.  Neurological: Negative for dizziness, weakness, light-headedness, numbness and headaches.  Psychiatric/Behavioral: Negative for suicidal ideas and hallucinations. The patient is not nervous/anxious.   All other systems reviewed and are negative.     Allergies  Review of patient's allergies indicates no known allergies.  Home Medications   Prior to Admission medications    Medication Sig Start Date End Date Taking? Authorizing Provider  carbamazepine (TEGRETOL XR) 200 MG 12 hr tablet Take 1 tablet (200 mg total) by mouth 2 (two) times daily. 11/05/13  Yes Beau FannyJohn C Withrow, FNP  FLUoxetine (PROZAC) 40 MG capsule Take 1 capsule (40 mg total) by mouth daily. 11/05/13  Yes Beau FannyJohn C Withrow, FNP  methocarbamol (ROBAXIN) 500 MG tablet Take 1 tablet (500 mg total) by mouth every 8 (eight) hours as needed for muscle spasms. 11/05/13  Yes Beau FannyJohn C Withrow, FNP  OLANZapine zydis (ZYPREXA) 10 MG disintegrating tablet Take 1 tablet (10 mg total) by mouth 2 (two) times daily. 11/05/13  Yes Beau FannyJohn C Withrow, FNP  omeprazole (PRILOSEC) 20 MG capsule Take 1 capsule (20 mg total) by mouth daily. 11/05/13  Yes Beau FannyJohn C Withrow, FNP  traZODone (DESYREL) 100 MG tablet Take 1 tablet (100 mg total) by mouth at bedtime as needed and may repeat dose one time if needed for sleep. 11/05/13  Yes Beau FannyJohn C Withrow, FNP   BP 130/85  Pulse 127  Temp(Src) 98.1 F (36.7 C) (Oral)  Resp 20  Ht 5\' 2"  (1.575 m)  Wt 205 lb (92.987 kg)  BMI 37.49 kg/m2  SpO2 98%  LMP 11/23/2013 Physical Exam  Nursing note and vitals reviewed. Constitutional: She is oriented to person, place, and time. She appears well-developed and well-nourished. No distress.  HENT:  Head: Normocephalic and atraumatic.  Mouth/Throat: Oropharynx is clear and moist.  Eyes: EOM are normal. Pupils are equal, round, and reactive to  light.  Neck: Normal range of motion. Neck supple.  Cardiovascular: Regular rhythm.   Tachycardia  Pulmonary/Chest: Effort normal and breath sounds normal. No respiratory distress. She has no wheezes. She has no rales.  Abdominal: Soft. Bowel sounds are normal. She exhibits no distension and no mass. There is no tenderness. There is no rebound and no guarding.  Musculoskeletal: Normal range of motion. She exhibits no edema and no tenderness.  Neurological: She is alert and oriented to person, place, and time.   Patient is alert and oriented x3 with clear, goal oriented speech. Patient has 5/5 motor in all extremities. Sensation is intact to light touch. Patient has a normal gait and walks without assistance.   Skin: Skin is warm and dry. No rash noted. No erythema.  Psychiatric:  Calm appearing. No current SI/HI.    ED Course  Procedures (including critical care time) Labs Review Labs Reviewed  CBC WITH DIFFERENTIAL  COMPREHENSIVE METABOLIC PANEL  URINALYSIS, ROUTINE W REFLEX MICROSCOPIC  PREGNANCY, URINE  ETHANOL    Imaging Review No results found.   EKG Interpretation None      MDM   Final diagnoses:  None   Patient is medically cleared for psychiatric evaluation. Pulse is improved in the emergency department. Alcohol/benzodiazepine withdrawal protocol initiated.     Loren Racer, MD 11/28/13 [phone]

## 2013-11-28 NOTE — ED Provider Notes (Signed)
Assumed care of patient from Dr. Ranae PalmsYelverton. [facility]  Pt was transferred in stable condition.  Medically stable throughout my care.   Denise MoMatthew Kimyah Frein, MD 11/28/13 [phone]

## 2013-11-28 NOTE — ED Notes (Signed)
Pt out at nurses station stating she wants the clonidine that the telepsyche person said she would give us.  Informed pt that the staff has not called us yet and informed us regarding this info and we will bring her medication once it is ordered.

## 2013-11-28 NOTE — ED Notes (Addendum)
Pt very demanding, calling out to nurses station frequently for numerous questions/demands.  Insisting on calling behavioral health.  Pt wanting to eat and drink.  Water given, instructed pt she can eat after tts.  Pt instructed that the process was long and to expect to wait for things to be processed.

## 2013-11-28 NOTE — ED Notes (Addendum)
Pt informed Dr. Littie DeedsGentry that she did not want to wait any longer and that she changed her mind.  Pt also relating a possibility [facility].  Informed pt that [facility] was [facility] and that a bed was being worked on.  Pt felt that it would be better if she left.  Pt denies SI or HI ideations.

## 2013-11-28 NOTE — ED Notes (Signed)
Pt reports wanting to detox from ETOH, opiates, benzo's. Reports she last used them today. Pt reports going to rehab recently. Denies SI, HI at this time.

## 2013-11-28 NOTE — ED Notes (Signed)
Pt was given coffee and graham crackers.  Immediately went to trashcan and vomitted.  Pt states she is detoxing from benzos.  Informed pt that her drug screen was negative and that she was given lorazepam at 6 am.

## 2013-11-28 NOTE — Discharge Instructions (Signed)
Opioid Withdrawal Opioids are a group of narcotic drugs. They include the street drug heroin. They also include pain medicines, such as morphine, hydrocodone, oxycodone, and fentanyl. Opioid withdrawal is a group of characteristic physical and mental signs and symptoms. It typically occurs if you have been using opioids daily for several weeks or longer and stop using or rapidly decrease use. Opioid withdrawal can also occur if you have used opioids daily for a long time and are given a medicine to block the effect.  SIGNS AND SYMPTOMS Opioid withdrawal includes three or more of the following symptoms:   Depressed, anxious, or irritable mood.  Nausea or vomiting.  Muscle aches or spasms.   Watery eyes.   Runny nose.  Dilated pupils, sweating, or hairs standing on end.  Diarrhea or intestinal cramping.  Yawning.   Fever.  Increased blood pressure.  Fast pulse.  Restlessness or trouble sleeping. These signs and symptoms occur within several hours of stopping or reducing short-acting opioids, such as heroin. They can occur within 3 days of stopping or reducing long-acting opioids, such as methadone. Withdrawal begins within minutes of receiving a drug that blocks the effects of opioids, such as naltrexone or naloxone. DIAGNOSIS  Opioid use disorder is diagnosed by your health care provider. You will be asked about your symptoms, drug and alcohol use, medical history, and use of medicines. A physical exam may be done. Lab tests may be ordered. Your health care provider may have you see a mental health professional.  TREATMENT  The treatment for opioid withdrawal is usually provided by medical doctors with special training in substance use disorders (addiction specialists). The following medicines may be included in treatment:  Opioids given in place of the abused opioid. They turn on opioid receptors in the brain and lessen or prevent withdrawal symptoms. They are gradually  decreased (opioid substitution and taper).  Non-opioids that can lessen certain opioid withdrawal symptoms. They may be used alone or with opioid substitution and taper. Successful long-term recovery usually requires medicine, counseling, and group support. HOME CARE INSTRUCTIONS   Take medicines only as directed by your health care provider.  Check with your health care provider before starting new medicines.  Keep all follow-up visits as directed by your health care provider. SEEK MEDICAL CARE IF:  You are not able to take your medicines as directed.  Your symptoms get worse.  You relapse. SEEK IMMEDIATE MEDICAL CARE IF:  You have serious thoughts about hurting yourself or others.  You have a seizure.  You lose consciousness. Document Released: 03/30/2003 Document Revised: 08/11/2013 Document Reviewed: 04/09/2013 [facility] Patient Information 2015 [place] CaveExitCare, MarylandLL[place]. This information is not intended to replace advice given to you by your health care provider. Make sure you discuss any questions you have with your health care provider.  Chemical Dependency Chemical dependency is an addiction to drugs or alcohol. It is characterized by the repeated behavior of seeking out and using drugs and alcohol despite harmful consequences to the health and safety of ones self and others.  RISK FACTORS There are certain situations or behaviors that increase a person's risk for chemical dependency. These include:  A family history of chemical dependency.  A history of mental health issues, including depression and anxiety.  A home environment where drugs and alcohol are easily available to you.  Drug or alcohol use at a young age. SYMPTOMS  The following symptoms can indicate chemical dependency:  Inability to limit the use of drugs or alcohol.  Nausea,  sweating, shakiness, and anxiety that occurs when alcohol or drugs are not being used. °· An increase in amount of drugs or alcohol that  is necessary to get drunk or high. °People who experience these symptoms can assess their use of drugs and alcohol by asking themselves the following questions: °· Have you been told by friends or family that they are worried about your use of alcohol or drugs? °· Do friends and family ever tell you about things you did while drinking alcohol or using drugs that you do not remember? °· Do you lie about using alcohol or drugs or about the amounts you use? °· Do you have difficulty completing daily tasks unless you use alcohol or drugs? °· Is the level of your work or school performance lower because of your drug or alcohol use? °· Do you get sick from using drugs or alcohol but keep using anyway? °· Do you feel uncomfortable in social situations unless you use alcohol or drugs? °· Do you use drugs or alcohol to help forget problems?  °An answer of yes to any of these questions may indicate chemical dependency. Professional evaluation is suggested. °Document Released: 03/21/2001 Document Revised: 06/19/2011 Document Reviewed: 06/02/2010 °ExitCare® Patient Information ©2015 ExitCare, LLC. This information is not intended to replace advice given to you by your health care provider. Make sure you discuss any questions you have with your health care provider. ° °Emergency Department Resource Guide °1) Find a Doctor and Pay Out of Pocket °Although you won't have to find out who is covered by your insurance plan, it is a good idea to ask around and get recommendations. You will then need to call the office and see if the doctor you have chosen will accept you as a new patient and what types of options they offer for patients who are self-pay. Some doctors offer discounts or will set up payment plans for their patients who do not have insurance, but you will need to ask so you aren't surprised when you get to your appointment. ° °2) Contact Your Local Health Department °Not all health departments have doctors that can see  patients for sick visits, but many do, so it is worth a call to see if yours does. If you don't know where your local health department is, you can check in your phone book. The CDC also has a tool to help you locate your state's health department, and many state websites also have listings of all of their local health departments. ° °3) Find a Walk-in Clinic °If your illness is not likely to be very severe or complicated, you may want to try a walk in clinic. These are popping up all over the country in pharmacies, drugstores, and shopping centers. They're usually staffed by nurse practitioners or physician assistants that have been trained to treat common illnesses and complaints. They're usually fairly quick and inexpensive. However, if you have serious medical issues or chronic medical problems, these are probably not your best option. ° °No Primary Care Doctor: °- Call Health Connect at  [phone] - they can help you locate a primary care doctor that  accepts your insurance, provides certain services, etc. °- Physician Referral Service- 1-800-533-3463 ° °Chronic Pain Problems: °Organization         Address  Phone   Notes  °[facility]  [phone] Patients need to be referred by their primary care doctor.  ° °Medication Assistance: °Organization           Address  Phone   Notes  [facility] Medication [facility] [address] [place], [place] [address] [phone] --Must be a resident of [facility] -- Must have NO insurance coverage whatsoever (no Medicaid/ Medicare, etc.) -- The pt. MUST have a primary care doctor that directs their care regularly and follows them in the community   [facility]ist  [phone]   [employer]  [phone]    Agencies that provide inexpensive medical care: Organization         Address  Phone   Notes  [place]  [phone]   [facility]    [phone]   [facility] [place], [place] [address] [phone]   [place] [address] [place] [phone]   Planned Parenthood    [phone]   [facility]    [phone]   [place] and [place] [address], [place] Phone:  [phone], Fax:  [phone] Hours of Operation:  9 am - 6 pm, M-F.  Also accepts Medicaid/Medicare and self-pay.  [facility] for [place], [address], [place] Phone: [phone], Fax: [phone]. Hours of Operation:  8:30 am - 5:30 pm, M-F.  Also accepts Medicaid and self-pay.  [facility] [address], [place] Phone: [phone]   [place], [place], [place] [phone], Ext. [phone] Mondays & Thursdays: 7-9 AM.  First 15 patients are seen on a first come, first serve basis.    [place] Providers:  Organization         Address  Phone   Notes  [facility] [address], [place] [phone] Also accepts self-pay patients.  [facility] [address], [place]  [phone]   [place], [address], [place] [phone]   [facility] [address] [place] [phone]   Lucianne Lei [address], [place]   [phone] Only accepts [place] Access [place] patients after they have their name applied to their card.   Self-Pay (no insurance) in [facility]:  Organization         Address  Phone   Notes  Sickle Cell Patients, [facility] [place] [phone]   [facility] Urgent Care [place] [phone]   [facility] [place]  [place], [place], [place] [phone]   [facility]/Dr. Osei-Bonsu   [address], [place] or [address], [place] [phone] Phone number for both [place] and [place] locations is the same.  Urgent Medical and [facility] [address] [place] [phone]   [facility] [address] [place] or [address] [phone]   [facility] [address], [place] [phone], phone; [phone], fax Sees patients 1st and 3rd Saturday of every month.  Must not qualify for public or private insurance (i.e. Medicaid, Medicare,  Health Choice, Veterans' Benefits)  Household income should be no more than 200% of the poverty level The clinic cannot treat you if you are pregnant or think you  are pregnant  Sexually transmitted diseases are not treated at the clinic.    Dental Care: Organization         Address  Phone  Notes  [facility] Department of [facility] [phone] Accepts children up to age 1 who are enrolled in [place] or [place]; pregnant women with a Medicaid card; and children who have applied for Medicaid or [place] Health Choice, but were declined, whose parents can pay a reduced fee at time of service.  [facility] of [facility]  [address], [place] [phone] Accepts children up to age 81 who are enrolled in [place] or [place]; pregnant women with a Medicaid card; and children who have applied for Medicaid or [place] Health Choice, but were declined, whose parents can pay a reduced fee at time of service.  [place] Adult [facility] Access PROGRAM  [place] [phone] Patients are seen by appointment only. Walk-ins are not accepted. [place] will see patients 21 years of age and older. Monday - Tuesday (8am-5pm) Most Wednesdays (8:30-5pm) $30 per visit, cash only  [facility] Adult Dental Access  PROGRAM  [address], [facility] [phone] Patients are seen by appointment only. Walk-ins are not accepted. [place] will see patients 41 years of age and older. One Wednesday Evening (Monthly: Volunteer Based).  $30 per visit, cash only  [place]  [phone] for adults; Children under age 40, call Graduate Pediatric Dentistry at [phone]. Children aged 32-14, please call [phone] to request a pediatric application.  Dental services are provided in all areas of dental care including fillings, crowns and bridges, complete and partial dentures, implants, gum treatment, root canals, and extractions. Preventive care is also provided. Treatment is provided to both adults and children. Patients are selected via a lottery and there is often a waiting list.   [facility] [address] [employer]  [phone] [email or web address]   [facility] [address] [place], [place] [phone], Ext. [phone] Second and Fourth Thursday of each month, opens at 6:30 AM; Clinic ends at 9 AM.  Patients are seen on a first-come first-served basis, and a limited number are seen during each clinic.   [facility]  [address] Hillard Danker [place], [place] [phone]   Eligibility Requirements You must have lived in [place], [place], or [place] counties for at least the last three months.   You cannot be eligible for state or federal sponsored [employer], including [employer], [place], or [employer].   You generally cannot be eligible for healthcare insurance through your employer.    How to apply: Eligibility screenings are held every Tuesday and Wednesday afternoon from 1:00 pm until 4:00 pm. You do not need an appointment for the interview!  [facility] [address], [place], [place]   [place]  [place] Department   [place]  [phone]    Behavioral Health Resources in the Community: Intensive Outpatient Programs Organization         Address  Phone  Notes  [place]. [address], [place], [place] [phone]   [facility] Outpatient [address] [place], [place]   ADS: Alcohol & Drug Svcs [address]  Dr, [place], [place]   [place] [address]  [place], [place] or [phone]   Substance Abuse Resources Organization         Address  Phone  Notes  Alcohol and Drug Services  [phone]   [place]  [phone]   [facility]   Chinita Pester  [phone]   Residential & Outpatient Substance Abuse Program  [phone]   Psychological Services Organization         Address  Phone  Notes  [facility]  [place]  [phone]   [place] [address], [place] or [phone]    Mobile Crisis Teams Organization         Address  Phone  Notes  [facility], Mobile Crisis Care Unit  [phone]   [facility]  [address] [place], [place]   [place] [address] [place] [phone]    Self-Help/Support Groups Organization         Address  Phone             Notes  [place]. of [place] - variety of support groups  [place] Call for more information  Narcotics Anonymous (NA), [facility] [address], [employer]   2 meetings at this location   Special educational needs teacher         Address  Phone  Notes  [facility]idential Treatment [place],    [place]  [phone]   [facility]  [address] [place] [id number], [place], [place]   [place], [place] [phone] Admissions: 8am-3pm M-F  Incentives Substance [place] [address],    [place], [place] [phone]   [facility] [address] [place], [place], [place]   [facility] [address],  [place], [place]   [facility] - Intensive Outpatient [address] Krist[address], [place], [place]   [facility] [place].,  [place], [place] [phone] or [phone]   [facility] ([facility]) [address] [place], [place] Accepts Medicaid  [facility] [place] [address]  [place] [phone] Substance Abuse/Addiction Treatment   [facility] Organization         Address  Phone  Notes  [facility]  [phone]   Domenic Schwab, PhD [address] Arlis Porta [place], [place]   [phone] or [phone]   [place], [place] [phone]   [facility] [address] [place], [place] [phone] Insurance/Medicaid/sponsorship through [facility] and Families [address], [address]                                    [place], [place] [phone] [place] [address], [place] [phone]    Dr. Adele Schilder  [phone]   [facility] Dept. 1) [address], [place] 2) [place] 3)  [place] [address], [place] [phone]  [phone]   [place] [phone])  [phone] or [phone] (After Hours)    ° ° ° °

## 2013-12-02 ENCOUNTER — Encounter (HOSPITAL_COMMUNITY): Payer: Self-pay | Admitting: Emergency Medicine

## 2013-12-02 ENCOUNTER — Emergency Department (HOSPITAL_COMMUNITY)
Admission: EM | Admit: 2013-12-02 | Discharge: 2013-12-02 | Disposition: A | Discharge status: Home / Self Care | Payer: PRIVATE HEALTH INSURANCE | Attending: Emergency Medicine | Admitting: Emergency Medicine

## 2013-12-02 DIAGNOSIS — F1310 Sedative, hypnotic or anxiolytic abuse, uncomplicated: Secondary | ICD-10-CM | POA: Insufficient documentation

## 2013-12-02 DIAGNOSIS — F1210 Cannabis abuse, uncomplicated: Secondary | ICD-10-CM | POA: Insufficient documentation

## 2013-12-02 DIAGNOSIS — F319 Bipolar disorder, unspecified: Secondary | ICD-10-CM | POA: Insufficient documentation

## 2013-12-02 DIAGNOSIS — F1910 Other psychoactive substance abuse, uncomplicated: Secondary | ICD-10-CM

## 2013-12-02 DIAGNOSIS — F411 Generalized anxiety disorder: Secondary | ICD-10-CM | POA: Insufficient documentation

## 2013-12-02 DIAGNOSIS — F1010 Alcohol abuse, uncomplicated: Secondary | ICD-10-CM | POA: Insufficient documentation

## 2013-12-02 DIAGNOSIS — Z79899 Other long term (current) drug therapy: Secondary | ICD-10-CM | POA: Insufficient documentation

## 2013-12-02 DIAGNOSIS — F17200 Nicotine dependence, unspecified, uncomplicated: Secondary | ICD-10-CM | POA: Insufficient documentation

## 2013-12-02 LAB — COMPREHENSIVE METABOLIC PANEL
ALT: 24 U/L (ref 0–35)
AST: 20 U/L (ref 0–37)
Albumin: 3.6 g/dL (ref 3.5–5.2)
Alkaline Phosphatase: 81 U/L (ref 39–117)
Anion gap: 14 (ref 5–15)
BUN: 8 mg/dL (ref 6–23)
CHLORIDE: 103 meq/L (ref 96–112)
CO2: 22 meq/L (ref 19–32)
CREATININE: 0.57 mg/dL (ref 0.50–1.10)
Calcium: 9 mg/dL (ref 8.4–10.5)
GLUCOSE: 104 mg/dL — AB (ref 70–99)
Potassium: 4.4 mEq/L (ref 3.7–5.3)
Sodium: 139 mEq/L (ref 137–147)
Total Bilirubin: <0.2 mg/dL — ABNORMAL LOW (ref 0.3–1.2)
Total Protein: 7.2 g/dL (ref 6.0–8.3)

## 2013-12-02 LAB — CBC
HCT: 39.3 % (ref 36.0–46.0)
HEMOGLOBIN: 13.6 g/dL (ref 12.0–15.0)
MCH: 34.2 pg — AB (ref 26.0–34.0)
MCHC: 34.6 g/dL (ref 30.0–36.0)
MCV: 98.7 fL (ref 78.0–100.0)
Platelets: 329 10*3/uL (ref 150–400)
RBC: 3.98 MIL/uL (ref 3.87–5.11)
RDW: 13 % (ref 11.5–15.5)
WBC: 8 10*3/uL (ref 4.0–10.5)

## 2013-12-02 LAB — RAPID URINE DRUG SCREEN, HOSP PERFORMED
AMPHETAMINES: NOT DETECTED
Barbiturates: NOT DETECTED
Benzodiazepines: POSITIVE — AB
Cocaine: NOT DETECTED
Opiates: NOT DETECTED
Tetrahydrocannabinol: POSITIVE — AB

## 2013-12-02 LAB — ETHANOL: ALCOHOL ETHYL (B): 68 mg/dL — AB (ref 0–11)

## 2013-12-02 MED ORDER — HYDROXYZINE HCL 25 MG PO TABS
25.0000 mg | ORAL_TABLET | Freq: Four times a day (QID) | ORAL | Status: DC | PRN
Start: 2013-12-02 — End: 2013-12-02
  Filled 2013-12-02: qty 1

## 2013-12-02 MED ORDER — CLONIDINE HCL 0.1 MG PO TABS
0.1000 mg | ORAL_TABLET | Freq: Four times a day (QID) | ORAL | Status: DC
  Administered 2013-12-02 (×2): 0.1 mg via ORAL
  Filled 2013-12-02 (×2): qty 1

## 2013-12-02 MED ORDER — METHOCARBAMOL 500 MG PO TABS: 500.0000 mg | ORAL_TABLET | Freq: Three times a day (TID) | ORAL | Status: DC | PRN

## 2013-12-02 MED ORDER — CLONIDINE HCL 0.1 MG PO TABS: 0.1000 mg | ORAL_TABLET | Freq: Every day | ORAL | Status: DC

## 2013-12-02 MED ORDER — LOPERAMIDE HCL 2 MG PO CAPS: 2.0000 mg | ORAL_CAPSULE | ORAL | Status: DC | PRN

## 2013-12-02 MED ORDER — LORAZEPAM 1 MG PO TABS
0.0000 mg | ORAL_TABLET | Freq: Four times a day (QID) | ORAL | Status: DC
  Administered 2013-12-02 (×2): 1 mg via ORAL
  Filled 2013-12-02: qty 2
  Filled 2013-12-02: qty 1

## 2013-12-02 MED ORDER — CLONIDINE HCL 0.1 MG PO TABS: 0.1000 mg | ORAL_TABLET | ORAL | Status: DC

## 2013-12-02 MED ORDER — PANTOPRAZOLE SODIUM 40 MG PO TBEC
40.0000 mg | DELAYED_RELEASE_TABLET | Freq: Once | ORAL | Status: AC
  Administered 2013-12-02: 40 mg via ORAL
  Filled 2013-12-02: qty 1

## 2013-12-02 MED ORDER — LORAZEPAM 1 MG PO TABS: 0.0000 mg | ORAL_TABLET | Freq: Two times a day (BID) | ORAL | Status: DC

## 2013-12-02 MED ORDER — DICYCLOMINE HCL 20 MG PO TABS: 20.0000 mg | ORAL_TABLET | Freq: Four times a day (QID) | ORAL | Status: DC | PRN

## 2013-12-02 MED ORDER — ONDANSETRON 4 MG PO TBDP: 4.0000 mg | ORAL_TABLET | Freq: Four times a day (QID) | ORAL | Status: DC | PRN

## 2013-12-02 MED ORDER — ALUM & MAG HYDROXIDE-SIMETH 200-200-20 MG/5ML PO SUSP
30.0000 mL | Freq: Four times a day (QID) | ORAL | Status: DC | PRN
  Administered 2013-12-02: 30 mL via ORAL
  Filled 2013-12-02: qty 30

## 2013-12-02 MED ORDER — NICOTINE 21 MG/24HR TD PT24
21.0000 mg | MEDICATED_PATCH | Freq: Once | TRANSDERMAL | Status: DC
  Administered 2013-12-02: 21 mg via TRANSDERMAL
  Filled 2013-12-02: qty 1

## 2013-12-02 MED ORDER — NAPROXEN 500 MG PO TABS
500.0000 mg | ORAL_TABLET | Freq: Two times a day (BID) | ORAL | Status: DC | PRN
Start: 2013-12-02 — End: 2013-12-02

## 2013-12-02 NOTE — ED Notes (Signed)
Assumed care of pt. Pt alert and oriented x 4. Reports not 'feeling good" and starting to feel anxious. requesting a nicotine patch at this time. MD made aware. Order placed. vw.rn

## 2013-12-02 NOTE — ED Notes (Signed)
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

## 2013-12-02 NOTE — ED Notes (Signed)
TTS in room, will transfer to [facility]

## 2013-12-02 NOTE — ED Notes (Signed)
1 pt belonging bag, 1 duffle bag in locker #29

## 2013-12-02 NOTE — Discharge Instructions (Signed)
Alcohol Use Disorder Alcohol use disorder is a mental disorder. It is not a one-time incident of heavy drinking. Alcohol use disorder is the excessive and uncontrollable use of alcohol over time that leads to problems with functioning in one or more areas of daily living. People with this disorder risk harming themselves and others when they drink to excess. Alcohol use disorder also can cause other mental disorders, such as mood and anxiety disorders, and serious physical problems. People with alcohol use disorder often misuse other drugs.  Alcohol use disorder is common and widespread. Some people with this disorder drink alcohol to cope with or escape from negative life events. Others drink to relieve chronic pain or symptoms of mental illness. People with a family history of alcohol use disorder are at higher risk of losing control and using alcohol to excess.  SYMPTOMS  Signs and symptoms of alcohol use disorder may include the following:   Consumption ofalcohol inlarger amounts or over a longer period of time than intended.  Multiple unsuccessful attempts to cutdown or control alcohol use.   A great deal of time spent obtaining alcohol, using alcohol, or recovering from the effects of alcohol (hangover).  A strong desire or urge to use alcohol (cravings).   Continued use of alcohol despite problems at work, school, or home because of alcohol use.   Continued use of alcohol despite problems in relationships because of alcohol use.  Continued use of alcohol in situations when it is physically hazardous, such as driving a car.  Continued use of alcohol despite awareness of a physical or psychological problem that is likely related to alcohol use. Physical problems related to alcohol use can involve the brain, heart, liver, stomach, and intestines. Psychological problems related to alcohol use include intoxication, depression, anxiety, psychosis, delirium, and dementia.   The need for  increased amounts of alcohol to achieve the same desired effect, or a decreased effect from the consumption of the same amount of alcohol (tolerance).  Withdrawal symptoms upon reducing or stopping alcohol use, or alcohol use to reduce or avoid withdrawal symptoms. Withdrawal symptoms include:  Racing heart.  Hand tremor.  Difficulty sleeping.  Nausea.  Vomiting.  Hallucinations.  Restlessness.  Seizures. DIAGNOSIS Alcohol use disorder is diagnosed through an assessment by your health care provider. Your health care provider may start by asking three or four questions to screen for excessive or problematic alcohol use. To confirm a diagnosis of alcohol use disorder, at least two symptoms must be present within a 12-month period. The severity of alcohol use disorder depends on the number of symptoms:  Mild--two or three.  Moderate--four or five.  Severe--six or more. Your health care provider may perform a physical exam or use results from lab tests to see if you have physical problems resulting from alcohol use. Your health care provider may refer you to a mental health professional for evaluation. TREATMENT  Some people with alcohol use disorder are able to reduce their alcohol use to low-risk levels. Some people with alcohol use disorder need to quit drinking alcohol. When necessary, mental health professionals with specialized training in substance use treatment can help. Your health care provider can help you decide how severe your alcohol use disorder is and what type of treatment you need. The following forms of treatment are available:   Detoxification. Detoxification involves the use of prescription medicines to prevent alcohol withdrawal symptoms in the first week after quitting. This is important for people with a history of symptoms   of withdrawal and for heavy drinkers who are likely to have withdrawal symptoms. Alcohol withdrawal can be dangerous and, in severe cases, cause  death. Detoxification is usually provided in a hospital or in-patient substance use treatment facility.  Counseling or talk therapy. Talk therapy is provided by substance use treatment counselors. It addresses the reasons people use alcohol and ways to keep them from drinking again. The goals of talk therapy are to help people with alcohol use disorder find healthy activities and ways to cope with life stress, to identify and avoid triggers for alcohol use, and to handle cravings, which can cause relapse.  Medicines.Different medicines can help treat alcohol use disorder through the following actions:  Decrease alcohol cravings.  Decrease the positive reward response felt from alcohol use.  Produce an uncomfortable physical reaction when alcohol is used (aversion therapy).  Support groups. Support groups are run by people who have quit drinking. They provide emotional support, advice, and guidance. These forms of treatment are often combined. Some people with alcohol use disorder benefit from intensive combination treatment provided by specialized substance use treatment centers. Both inpatient and outpatient treatment programs are available. Document Released: 05/04/2004 Document Revised: 08/11/2013 Document Reviewed: 07/04/2012 ExitCare Patient Information 2015 ExitCare, LLC. This information is not intended to replace advice given to you by your health care provider. Make sure you discuss any questions you have with your health care provider.  

## 2013-12-02 NOTE — ED Notes (Signed)
Per EMS pt is coming from home and is requesting detox from alcohol, opiates, and xanax  Pt states today she has drank a fifth of vodka, taken 4 to 5 xanax, and half a suboxin  Pt states she has vomited 3 times today and states she is now going through DTs

## 2013-12-02 NOTE — ED Provider Notes (Signed)
CSN: [id number]     Arrival date & time 12/02/13  0050 History   First MD Initiated Contact with Patient 12/02/13 0203     Chief Complaint  Patient presents with  . Drug / Alcohol Assessment   HPI    History provided by the patient. Patient is a 33 year old female with history of bipolar disorder, polysubstance abuse and EtOH abuse presenting with request for help with alcohol, benzos and opiates addiction. Currently denies any SI or HI but does report worsened depression. Denies any other area or alleviating factors no other associated symptoms. Patient last used drugs today and reports using between 10-12 Xanax a day for the past several months.  Past Medical History  Diagnosis Date  . Depression   . Bipolar 1 disorder   . Anxiety   . Drug abuse   . ETOH abuse   . Polysubstance abuse    History reviewed. No pertinent past surgical history. History reviewed. No pertinent family history. History  Substance Use Topics  . Smoking status: Current Every Day Smoker -- 2.00 packs/day for 17 years    Types: Cigarettes  . Smokeless tobacco: Never Used  . Alcohol Use: 7.2 oz/week    12 Cans of beer per week     Comment: 12 pack of beer per day plus liquor or wine, whatever is available and fits in her pocket at work   OB History   Grav Para Term Preterm Abortions TAB SAB Ect Mult Living                 Review of Systems  All other systems reviewed and are negative.     Allergies  Review of patient's allergies indicates no known allergies.  Home Medications   Prior to Admission medications   Medication Sig Start Date End Date Taking? Authorizing Provider  carbamazepine (TEGRETOL XR) 200 MG 12 hr tablet Take 1 tablet (200 mg total) by mouth 2 (two) times daily. 11/05/13  Yes Beau Fanny, FNP  clonazePAM (KLONOPIN) 2 MG tablet Take 2 mg by mouth 3 (three) times daily.   Yes Historical Provider, MD  cloNIDine (CATAPRES) 0.2 MG tablet Take 0.2 mg by mouth 3 (three) times  daily.   Yes Historical Provider, MD  FLUoxetine (PROZAC) 40 MG capsule Take 1 capsule (40 mg total) by mouth daily. 11/05/13  Yes Beau Fanny, FNP  methocarbamol (ROBAXIN) 500 MG tablet Take 1 tablet (500 mg total) by mouth every 8 (eight) hours as needed for muscle spasms. 11/05/13  Yes Beau Fanny, FNP  OLANZapine zydis (ZYPREXA) 10 MG disintegrating tablet Take 1 tablet (10 mg total) by mouth 2 (two) times daily. 11/05/13  Yes Beau Fanny, FNP  omeprazole (PRILOSEC) 20 MG capsule Take 1 capsule (20 mg total) by mouth daily. 11/05/13  Yes Beau Fanny, FNP  traZODone (DESYREL) 100 MG tablet Take 1 tablet (100 mg total) by mouth at bedtime as needed and may repeat dose one time if needed for sleep. 11/05/13  Yes Beau Fanny, FNP   BP 109/65  Pulse 98  Temp(Src) 98.1 F (36.7 C) (Oral)  Resp 22  SpO2 100%  LMP 11/23/2013 Physical Exam  Nursing note and vitals reviewed. Constitutional: She is oriented to person, place, and time. She appears well-developed and well-nourished. No distress.  HENT:  Head: Normocephalic.  Cardiovascular: Normal rate and regular rhythm.   Pulmonary/Chest: Effort normal and breath sounds normal.  Abdominal: Soft.  Neurological: She is alert and oriented  to person, place, and time.  Skin: Skin is warm and dry. No rash noted.  Psychiatric: She has a normal mood and affect. Her behavior is normal.    ED Course  Procedures   COORDINATION OF CARE:  Nursing notes reviewed. Vital signs reviewed. Initial pt interview and examination performed.   Filed Vitals:   12/02/13 0145  BP: 109/65  Pulse: 98  Temp: 98.1 F (36.7 C)  TempSrc: Oral  Resp: 22  SpO2: 100%    3:56 AM-patient seen and evaluated. Pt calm and well-appearing. Denies any SI.  TTS consult placed. Psychiatric holding orders in place.  Patient medically cleared for further evaluation and treatment.    Results for orders placed during the hospital encounter of 12/02/13  CBC       Result Value Ref Range   WBC 8.0  4.0 - 10.5 K/uL   RBC 3.98  3.87 - 5.11 MIL/uL   Hemoglobin 13.6  12.0 - 15.0 g/dL   HCT 09.8  11.9 - 14.7 %   MCV 98.7  78.0 - 100.0 fL   MCH 34.2 (*) 26.0 - 34.0 pg   MCHC 34.6  30.0 - 36.0 g/dL   RDW 82.9  56.2 - 13.0 %   Platelets 329  150 - 400 K/uL  COMPREHENSIVE METABOLIC PANEL      Result Value Ref Range   Sodium 139  137 - 147 mEq/L   Potassium 4.4  3.7 - 5.3 mEq/L   Chloride 103  96 - 112 mEq/L   CO2 22  19 - 32 mEq/L   Glucose, Bld 104 (*) 70 - 99 mg/dL   BUN 8  6 - 23 mg/dL   Creatinine, Ser 8.65  0.50 - 1.10 mg/dL   Calcium 9.0  8.4 - 78.4 mg/dL   Total Protein 7.2  6.0 - 8.3 g/dL   Albumin 3.6  3.5 - 5.2 g/dL   AST 20  0 - 37 U/L   ALT 24  0 - 35 U/L   Alkaline Phosphatase 81  39 - 117 U/L   Total Bilirubin <0.2 (*) 0.3 - 1.2 mg/dL   GFR calc non Af Amer >90  >90 mL/min   GFR calc Af Amer >90  >90 mL/min   Anion gap 14  5 - 15  ETHANOL      Result Value Ref Range   Alcohol, Ethyl (B) 68 (*) 0 - 11 mg/dL  URINE RAPID DRUG SCREEN (HOSP PERFORMED)      Result Value Ref Range   Opiates NONE DETECTED  NONE DETECTED   Cocaine NONE DETECTED  NONE DETECTED   Benzodiazepines POSITIVE (*) NONE DETECTED   Amphetamines NONE DETECTED  NONE DETECTED   Tetrahydrocannabinol POSITIVE (*) NONE DETECTED   Barbiturates NONE DETECTED  NONE DETECTED     MDM   Final diagnoses:  Polysubstance abuse  ETOH abuse        Angus Seller, PA-C 12/02/13 [phone]
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
## 2013-12-02 NOTE — BH Assessment (Addendum)
[facility] Assessment Progress Note   Pt accepted to [facility].  Will be transported by [facility].  Dr Blinda Leatherwood notified.  PT reports she does not want to leave [facility] for treatment and wants to leave.

## 2013-12-04 NOTE — ED Provider Notes (Signed)
Medical screening examination/treatment/procedure(s) were performed by non-physician practitioner and as supervising physician I was immediately available for consultation/collaboration.   EKG Interpretation None        Enid Skeens, MD 12/04/13 [phone]

## 2014-01-23 ENCOUNTER — Emergency Department (HOSPITAL_COMMUNITY)
Admission: EM | Admit: 2014-01-23 | Discharge: 2014-01-23 | Disposition: A | Discharge status: Home / Self Care | Payer: PRIVATE HEALTH INSURANCE | Attending: Emergency Medicine | Admitting: Emergency Medicine

## 2014-01-23 ENCOUNTER — Encounter (HOSPITAL_COMMUNITY): Payer: Self-pay | Admitting: Emergency Medicine

## 2014-01-23 DIAGNOSIS — F419 Anxiety disorder, unspecified: Secondary | ICD-10-CM | POA: Insufficient documentation

## 2014-01-23 DIAGNOSIS — F319 Bipolar disorder, unspecified: Secondary | ICD-10-CM | POA: Insufficient documentation

## 2014-01-23 DIAGNOSIS — L0291 Cutaneous abscess, unspecified: Secondary | ICD-10-CM

## 2014-01-23 DIAGNOSIS — Z720 Tobacco use: Secondary | ICD-10-CM | POA: Insufficient documentation

## 2014-01-23 DIAGNOSIS — B370 Candidal stomatitis: Secondary | ICD-10-CM | POA: Insufficient documentation

## 2014-01-23 DIAGNOSIS — Z79899 Other long term (current) drug therapy: Secondary | ICD-10-CM | POA: Insufficient documentation

## 2014-01-23 DIAGNOSIS — L02415 Cutaneous abscess of right lower limb: Secondary | ICD-10-CM | POA: Insufficient documentation

## 2014-01-23 DIAGNOSIS — E669 Obesity, unspecified: Secondary | ICD-10-CM | POA: Insufficient documentation

## 2014-01-23 MED ORDER — LIDOCAINE-EPINEPHRINE (PF) 2 %-1:200000 IJ SOLN
10.0000 mL | Freq: Once | INTRAMUSCULAR | Status: AC
  Administered 2014-01-23: 10 mL via INTRADERMAL
  Filled 2014-01-23: qty 20

## 2014-01-23 MED ORDER — SULFAMETHOXAZOLE-TRIMETHOPRIM 800-160 MG PO TABS: 1.0000 | ORAL_TABLET | Freq: Two times a day (BID) | ORAL | Status: DC

## 2014-01-23 MED ORDER — TRAMADOL HCL 50 MG PO TABS: 50.0000 mg | ORAL_TABLET | Freq: Four times a day (QID) | ORAL | Status: DC | PRN

## 2014-01-23 MED ORDER — NYSTATIN 100000 UNIT/ML MT SUSP: 500000.0000 [IU] | Freq: Four times a day (QID) | OROMUCOSAL | Status: DC

## 2014-01-23 NOTE — ED Notes (Signed)
Pt with multiple complaits:  1- "abscess" left inner thigh-recurring 2- "thrush" in mouth 3- rash on chest 4- "planters wart" x 1.5 years. Was told she would have to have it "cut out".

## 2014-01-23 NOTE — ED Provider Notes (Signed)
Medical screening examination/treatment/procedure(s) were performed by non-physician practitioner and as supervising physician I was immediately available for consultation/collaboration.   EKG Interpretation None        Alexarae Oliva, DO 01/23/14 2035 

## 2014-01-23 NOTE — ED Notes (Signed)
Pt c/o left abscess to upper thigh with hx of same; pt sts rash to chest; pt sts hx of thrush and sts feels same as well

## 2014-01-23 NOTE — Discharge Instructions (Signed)
Abscess °An abscess (boil or furuncle) is an infected area on or under the skin. This area is filled with yellowish-white fluid (pus) and other material (debris). °HOME CARE  °· Only take medicines as told by your doctor. °· If you were given antibiotic medicine, take it as directed. Finish the medicine even if you start to feel better. °· If gauze is used, follow your doctor's directions for changing the gauze. °· To avoid spreading the infection: °¨ Keep your abscess covered with a bandage. °¨ Wash your hands well. °¨ Do not share personal care items, towels, or whirlpools with others. °¨ Avoid skin contact with others. °· Keep your skin and clothes clean around the abscess. °· Keep all doctor visits as told. °GET HELP RIGHT AWAY IF:  °· You have more pain, puffiness (swelling), or redness in the wound site. °· You have more fluid or blood coming from the wound site. °· You have muscle aches, chills, or you feel sick. °· You have a fever. °MAKE SURE YOU:  °· Understand these instructions. °· Will watch your condition. °· Will get help right away if you are not doing well or get worse. °Document Released: 09/13/2007 Document Revised: 09/26/2011 Document Reviewed: 06/09/2011 °Exit[employer]® Patient Information ©2015 [employer]. This information is not intended to replace advice given to you by your health care provider. Make sure you discuss any questions you have with your health care provider. ° °

## 2014-01-23 NOTE — ED Provider Notes (Signed)
CSN: [id number]     Arrival date & time 01/23/14  1313 History  This chart was scribed for non-physician practitioner, Denise SilkHannah Kishana Battey, PA-C,working with Samuel JesterKathleen McManus, DO, by Karle PlumberJennifer Tensley, ED Scribe. This patient was seen in room TR08C/TR08C and the patient's care was started at 2:18 PM.  Chief Complaint  Patient presents with  . Abscess   Patient is a 33 y.o. female presenting with abscess. The history is provided by the patient. No language interpreter was used.  Abscess Associated symptoms: no fever, no nausea and no vomiting    HPI Comments:  [name] is a 33 y.o. obese female with PMHx of depression, anxiety, bipolar disorder, polysubstance and ETOH abuse who presents to the Emergency Department complaining of an abscess to her inner, upper left thigh that began about two days ago. She reports sharp, burning pain of the area. Pt also reports thrush in her mouth in which she has been using Nystatin oral suspension but is now out. She has not done anything to treat the symptoms of the abscess. She denies fever, chills, nausea or vomiting. She reports h/o abscesses.  Past Medical History  Diagnosis Date  . Depression   . Bipolar 1 disorder   . Anxiety   . Drug abuse   . ETOH abuse   . Polysubstance abuse    History reviewed. No pertinent past surgical history. History reviewed. No pertinent family history. History  Substance Use Topics  . Smoking status: Current Every Day Smoker -- 2.00 packs/day for 17 years    Types: Cigarettes  . Smokeless tobacco: Never Used  . Alcohol Use: 7.2 oz/week    12 Cans of beer per week     Comment: 12 pack of beer per day plus liquor or wine, whatever is available and fits in her pocket at work   OB History   Grav Para Term Preterm Abortions TAB SAB Ect Mult Living                 Review of Systems  Constitutional: Negative for fever and chills.  Gastrointestinal: Negative for nausea and vomiting.  Skin:       Abscess to  left upper thigh.  All other systems reviewed and are negative.   Allergies  Review of patient's allergies indicates no known allergies.  Home Medications   Prior to Admission medications   Medication Sig Start Date End Date Taking? Authorizing Provider  carbamazepine (TEGRETOL XR) 200 MG 12 hr tablet Take 1 tablet (200 mg total) by mouth 2 (two) times daily. 11/05/13   Beau FannyJohn C Withrow, FNP  clonazePAM (KLONOPIN) 2 MG tablet Take 2 mg by mouth 3 (three) times daily.    Historical Provider, MD  cloNIDine (CATAPRES) 0.2 MG tablet Take 0.2 mg by mouth 3 (three) times daily.    Historical Provider, MD  FLUoxetine (PROZAC) 40 MG capsule Take 1 capsule (40 mg total) by mouth daily. 11/05/13   Beau FannyJohn C Withrow, FNP  methocarbamol (ROBAXIN) 500 MG tablet Take 1 tablet (500 mg total) by mouth every 8 (eight) hours as needed for muscle spasms. 11/05/13   Beau FannyJohn C Withrow, FNP  OLANZapine zydis (ZYPREXA) 10 MG disintegrating tablet Take 1 tablet (10 mg total) by mouth 2 (two) times daily. 11/05/13   Beau FannyJohn C Withrow, FNP  omeprazole (PRILOSEC) 20 MG capsule Take 1 capsule (20 mg total) by mouth daily. 11/05/13   Beau FannyJohn C Withrow, FNP  traZODone (DESYREL) 100 MG tablet Take 1 tablet (100 mg total)  by mouth at bedtime as needed and may repeat dose one time if needed for sleep. 11/05/13   Beau FannyJohn C Withrow, FNP   Triage Vitals: BP 110/87  Pulse 107  Temp(Src) 97.7 F (36.5 C) (Oral)  Resp 20  Ht 5\' 2"  (1.575 m)  Wt 215 lb (97.523 kg)  BMI 39.31 kg/m2  SpO2 99% Physical Exam  Nursing note and vitals reviewed. Constitutional: She is oriented to person, place, and time. She appears well-developed and well-nourished. No distress.  HENT:  Head: Normocephalic and atraumatic.  Right Ear: External ear normal.  Left Ear: External ear normal.  Nose: Nose normal.  Mouth/Throat: Oropharynx is clear and moist.  Thrush present on tongue.  Eyes: Conjunctivae are normal.  Neck: Normal range of motion.  Cardiovascular:  Normal rate, regular rhythm and normal heart sounds.  Exam reveals no gallop and no friction rub.   No murmur heard. Pulmonary/Chest: Effort normal and breath sounds normal. No stridor. No respiratory distress. She has no wheezes. She has no rales.  Abdominal: Soft. She exhibits no distension.  Musculoskeletal: Normal range of motion.  Neurological: She is alert and oriented to person, place, and time. She has normal strength.  Skin: Skin is warm and dry. She is not diaphoretic. No erythema.  2 cm area of induration to left medial thigh with slight surrounding erythema.  Psychiatric: She has a normal mood and affect. Her behavior is normal.    ED Course  Procedures (including critical care time) DIAGNOSTIC STUDIES: Oxygen Saturation is 99% on RA, normal by my interpretation.   COORDINATION OF CARE: 2:21 PM- Will I & D abscess and prescribe pain medication and antibiotic. Will refill Nystatin oral suspension for thrush. Pt verbalizes understanding and agrees to plan.  INCISION AND DRAINAGE PROCEDURE NOTE: Patient identification was confirmed and verbal consent was obtained. This procedure was performed by Denise SilkHannah Gal Feldhaus, PA-C at 2:21 PM. Site: upper right medial thigh Sterile procedures observed Needle size: 25 G Anesthetic used (type and amt): Lidocaine 2% with Epinephrine (2 mLs) Blade size: 11 Drainage: moderate purulence Complexity: Complex Packing used: none Site anesthetized, incision made over site, wound drained and explored loculations, rinsed with copious amounts of normal saline, wound packed with sterile gauze, covered with dry, sterile dressing.  Pt tolerated procedure well without complications.  Instructions for care discussed verbally and pt provided with additional written instructions for homecare and f/u.  Medications  lidocaine-EPINEPHrine (XYLOCAINE W/EPI) 2 %-1:200000 (PF) injection 10 mL (not administered)    Labs Review Labs Reviewed - No data to  display  Imaging Review No results found.   EKG Interpretation None      MDM   Final diagnoses:  Abscess    Patient with skin abscess amenable to incision and drainage.  Abscess was not large enough to warrant packing or drain,  wound recheck in 2 days. Encouraged home warm soaks and flushing.  Mild signs of cellulitis is surrounding skin. Given recurrent infection with cover for MRSA with Bactrim. Patient very concerned that her thrush will worsen in mouth. Will give nystatin. Will d/c to home.     I personally performed the services described in this documentation, which was scribed in my presence. The recorded information has been reviewed and is accurate.    Mora BellmanHannah S Landyn Buckalew, PA-C 01/23/14 [phone]

## 2014-09-04 ENCOUNTER — Emergency Department (HOSPITAL_COMMUNITY)
Admission: EM | Admit: 2014-09-04 | Discharge: 2014-09-04 | Disposition: A | Discharge status: Home / Self Care | Payer: PRIVATE HEALTH INSURANCE | Attending: Emergency Medicine | Admitting: Emergency Medicine

## 2014-09-04 ENCOUNTER — Encounter (HOSPITAL_COMMUNITY): Payer: Self-pay

## 2014-09-04 ENCOUNTER — Emergency Department (HOSPITAL_COMMUNITY): Payer: PRIVATE HEALTH INSURANCE

## 2014-09-04 DIAGNOSIS — Z3202 Encounter for pregnancy test, result negative: Secondary | ICD-10-CM | POA: Insufficient documentation

## 2014-09-04 DIAGNOSIS — Z720 Tobacco use: Secondary | ICD-10-CM | POA: Insufficient documentation

## 2014-09-04 DIAGNOSIS — Z79899 Other long term (current) drug therapy: Secondary | ICD-10-CM | POA: Insufficient documentation

## 2014-09-04 DIAGNOSIS — F319 Bipolar disorder, unspecified: Secondary | ICD-10-CM | POA: Insufficient documentation

## 2014-09-04 DIAGNOSIS — R110 Nausea: Secondary | ICD-10-CM | POA: Insufficient documentation

## 2014-09-04 DIAGNOSIS — R1012 Left upper quadrant pain: Secondary | ICD-10-CM

## 2014-09-04 DIAGNOSIS — R55 Syncope and collapse: Secondary | ICD-10-CM | POA: Insufficient documentation

## 2014-09-04 DIAGNOSIS — F419 Anxiety disorder, unspecified: Secondary | ICD-10-CM | POA: Insufficient documentation

## 2014-09-04 LAB — URINALYSIS, ROUTINE W REFLEX MICROSCOPIC
BILIRUBIN URINE: NEGATIVE
GLUCOSE, UA: NEGATIVE mg/dL
Ketones, ur: NEGATIVE mg/dL
LEUKOCYTES UA: NEGATIVE
NITRITE: NEGATIVE
Protein, ur: NEGATIVE mg/dL
SPECIFIC GRAVITY, URINE: 1.024 (ref 1.005–1.030)
UROBILINOGEN UA: 0.2 mg/dL (ref 0.0–1.0)
pH: 5 (ref 5.0–8.0)

## 2014-09-04 LAB — LIPASE, BLOOD: Lipase: 20 U/L — ABNORMAL LOW (ref 22–51)

## 2014-09-04 LAB — CBC WITH DIFFERENTIAL/PLATELET
Basophils Absolute: 0 10*3/uL (ref 0.0–0.1)
Basophils Relative: 0 % (ref 0–1)
Eosinophils Absolute: 0 10*3/uL (ref 0.0–0.7)
Eosinophils Relative: 1 % (ref 0–5)
HCT: 38.2 % (ref 36.0–46.0)
Hemoglobin: 12.9 g/dL (ref 12.0–15.0)
Lymphocytes Relative: 33 % (ref 12–46)
Lymphs Abs: 2.2 10*3/uL (ref 0.7–4.0)
MCH: 32.7 pg (ref 26.0–34.0)
MCHC: 33.8 g/dL (ref 30.0–36.0)
MCV: 96.7 fL (ref 78.0–100.0)
MONO ABS: 0.4 10*3/uL (ref 0.1–1.0)
Monocytes Relative: 7 % (ref 3–12)
NEUTROS ABS: 3.9 10*3/uL (ref 1.7–7.7)
NEUTROS PCT: 59 % (ref 43–77)
PLATELETS: 322 10*3/uL (ref 150–400)
RBC: 3.95 MIL/uL (ref 3.87–5.11)
RDW: 12.3 % (ref 11.5–15.5)
WBC: 6.6 10*3/uL (ref 4.0–10.5)

## 2014-09-04 LAB — COMPREHENSIVE METABOLIC PANEL
ALK PHOS: 60 U/L (ref 38–126)
ALT: 18 U/L (ref 14–54)
ANION GAP: 6 (ref 5–15)
AST: 17 U/L (ref 15–41)
Albumin: 3.6 g/dL (ref 3.5–5.0)
BUN: 10 mg/dL (ref 6–20)
CO2: 23 mmol/L (ref 22–32)
CREATININE: 0.58 mg/dL (ref 0.44–1.00)
Calcium: 8.8 mg/dL — ABNORMAL LOW (ref 8.9–10.3)
Chloride: 108 mmol/L (ref 101–111)
GFR calc Af Amer: >60 mL/min (ref >60)
GFR calc non Af Amer: >60 mL/min (ref >60)
Glucose, Bld: 109 mg/dL — ABNORMAL HIGH (ref 65–99)
Potassium: 3.9 mmol/L (ref 3.5–5.1)
SODIUM: 137 mmol/L (ref 135–145)
Total Bilirubin: 0.4 mg/dL (ref 0.3–1.2)
Total Protein: 6.3 g/dL — ABNORMAL LOW (ref 6.5–8.1)

## 2014-09-04 LAB — URINE MICROSCOPIC-ADD ON

## 2014-09-04 LAB — POC URINE PREG, ED: PREG TEST UR: NEGATIVE

## 2014-09-04 LAB — CBG MONITORING, ED: GLUCOSE-CAPILLARY: 113 mg/dL — AB (ref 65–99)

## 2014-09-04 LAB — I-STAT TROPONIN, ED: TROPONIN I, POC: 0 ng/mL (ref 0.00–0.08)

## 2014-09-04 MED ORDER — POLYETHYLENE GLYCOL 3350 17 G PO PACK: 17.0000 g | PACK | Freq: Every day | ORAL | Status: DC

## 2014-09-04 NOTE — ED Notes (Signed)
Pt. From home complaint of LUQ abd pain x 3 days. Pt. Had near syncope when using the bathroom this morning. Vitals 114/78, 67 HR, 100% RA. Pt. Ambulatory at this time.

## 2014-09-04 NOTE — ED Provider Notes (Signed)
CSN: [id number]     Arrival date & time 09/04/14  0707 History   First MD Initiated Contact with Patient 09/04/14 [phone]     Chief Complaint  Patient presents with  . Abdominal Pain     (Consider location/radiation/quality/duration/timing/severity/associated sxs/prior Treatment) Patient is a 34 y.o. female presenting with near-syncope. The history is provided by the patient. No language interpreter was used.  Near Syncope This is a new problem. The current episode started today. The problem occurs constantly. The problem has been gradually worsening. Associated symptoms include nausea. Pertinent negatives include no change in bowel habit, visual change or vomiting. Nothing aggravates the symptoms. She has tried nothing for the symptoms. The treatment provided moderate relief.  Pt reports she was sitting on toliet.  Pt reports passing out when she stood up.   Pt reports she has had constipation.  (Pt reports she did not urinate) Pt reports no bowel movement.  Pt reports she has had pain in her left side for several days.  Past Medical History  Diagnosis Date  . Depression   . Bipolar 1 disorder   . Anxiety   . Drug abuse   . ETOH abuse   . Polysubstance abuse    History reviewed. No pertinent past surgical history. No family history on file. History  Substance Use Topics  . Smoking status: Current Every Day Smoker -- 2.00 packs/day for 17 years    Types: Cigarettes  . Smokeless tobacco: Never Used  . Alcohol Use: 7.2 oz/week    12 Cans of beer per week     Comment: 12 pack of beer per day plus liquor or wine, whatever is available and fits in her pocket at work   OB History    No data available     Review of Systems  Cardiovascular: Positive for near-syncope.  Gastrointestinal: Positive for nausea. Negative for vomiting and change in bowel habit.  All other systems reviewed and are negative.     Allergies  Review of patient's allergies indicates no known  allergies.  Home Medications   Prior to Admission medications   Medication Sig Start Date End Date Taking? Authorizing Provider  carbamazepine (TEGRETOL XR) 200 MG 12 hr tablet Take 1 tablet (200 mg total) by mouth 2 (two) times daily. 11/05/13   Beau Fanny, FNP  clonazePAM (KLONOPIN) 2 MG tablet Take 2 mg by mouth 3 (three) times daily.    Historical Provider, MD  cloNIDine (CATAPRES) 0.2 MG tablet Take 0.2 mg by mouth 3 (three) times daily.    Historical Provider, MD  FLUoxetine (PROZAC) 40 MG capsule Take 1 capsule (40 mg total) by mouth daily. 11/05/13   Beau Fanny, FNP  methocarbamol (ROBAXIN) 500 MG tablet Take 1 tablet (500 mg total) by mouth every 8 (eight) hours as needed for muscle spasms. 11/05/13   Beau Fanny, FNP  nystatin (MYCOSTATIN) 100000 UNIT/ML suspension Take 5 mLs (500,000 Units total) by mouth 4 (four) times daily. 01/23/14   Junious Silk, PA-C  OLANZapine zydis (ZYPREXA) 10 MG disintegrating tablet Take 1 tablet (10 mg total) by mouth 2 (two) times daily. 11/05/13   Beau Fanny, FNP  omeprazole (PRILOSEC) 20 MG capsule Take 1 capsule (20 mg total) by mouth daily. 11/05/13   Beau Fanny, FNP  sulfamethoxazole-trimethoprim (SEPTRA DS) 800-160 MG per tablet Take 1 tablet by mouth every 12 (twelve) hours. 01/23/14   Junious Silk, PA-C  traMADol (ULTRAM) 50 MG tablet Take 1 tablet (50 mg  total) by mouth every 6 (six) hours as needed. 01/23/14   Junious SilkHannah Merrell, PA-C  traZODone (DESYREL) 100 MG tablet Take 1 tablet (100 mg total) by mouth at bedtime as needed and may repeat dose one time if needed for sleep. 11/05/13   Everardo AllJohn C Withrow, FNP   BP 109/64 mmHg  Temp(Src) 98.1 F (36.7 C) (Oral)  Resp 18  SpO2 98%  LMP 08/30/2014 Physical Exam  Constitutional: She is oriented to person, place, and time. She appears well-developed and well-nourished.  HENT:  Head: Normocephalic and atraumatic.  Right Ear: External ear normal.  Left Ear: External ear normal.   Nose: Nose normal.  Mouth/Throat: Oropharynx is clear and moist.  Eyes: Conjunctivae and EOM are normal. Pupils are equal, round, and reactive to light.  Neck: Normal range of motion.  Cardiovascular: Normal rate and regular rhythm.   Pulmonary/Chest: Effort normal and breath sounds normal.  Abdominal: Soft. She exhibits no distension. There is tenderness.  Tender left lateral abdomen   Musculoskeletal: Normal range of motion.  Neurological: She is alert and oriented to person, place, and time.  Skin: Skin is warm.  Psychiatric: She has a normal mood and affect.  Nursing note and vitals reviewed.   ED Course  Procedures (including critical care time) Labs Review Labs Reviewed  CBG MONITORING, ED - Abnormal; Notable for the following:    Glucose-Capillary 113 (*)    All other components within normal limits  CBC WITH DIFFERENTIAL/PLATELET  COMPREHENSIVE METABOLIC PANEL  LIPASE, BLOOD  URINALYSIS, ROUTINE W REFLEX MICROSCOPIC ([facility])  POC URINE PREG, ED  I-STAT TROPOININ, ED    Imaging Review No results found.   EKG Interpretation None    normal sinus normal 59 qrs, st normal,  Normal qt  MDM  No sign of acute abdomen   Final diagnoses:  Near syncope  Left upper quadrant pain     miralax   Elson AreasLeslie K Vaiden Adames, PA-C 09/04/14 [id number]  Blake DivineJohn Wofford, MD 09/08/14 [phone]

## 2014-09-04 NOTE — Discharge Instructions (Signed)
Constipation Constipation is when a person has fewer than three bowel movements a week, has difficulty having a bowel movement, or has stools that are dry, hard, or larger than normal. As people grow older, constipation is more common. If you try to fix constipation with medicines that make you have a bowel movement (laxatives), the problem may get worse. Long-term laxative use may cause the muscles of the colon to become weak. A low-fiber diet, not taking in enough fluids, and taking certain medicines may make constipation worse.  CAUSES  Certain medicines, such as antidepressants, pain medicine, iron supplements, antacids, and water pills.  Certain diseases, such as diabetes, irritable bowel syndrome (IBS), thyroid disease, or depression.  Not drinking enough water.  Not eating enough fiber-rich foods.  Stress or travel.  Lack of physical activity or exercise.  Ignoring the urge to have a bowel movement.  Using laxatives too much.  SIGNS AND SYMPTOMS  Having fewer than three bowel movements a week.  Straining to have a bowel movement.  Having stools that are hard, dry, or larger than normal.  Feeling full or bloated.  Pain in the lower abdomen.  Not feeling relief after having a bowel movement.  DIAGNOSIS  Your health care provider will take a medical history and perform a physical exam. Further testing may be done for severe constipation. Some tests may include: A barium enema X-ray to examine your rectum, colon, and, sometimes, your small intestine.  A sigmoidoscopy to examine your lower colon.  A colonoscopy to examine your entire colon. TREATMENT  Treatment will depend on the severity of your constipation and what is causing it. Some dietary treatments include drinking more fluids and eating more fiber-rich foods. Lifestyle treatments may include regular exercise. If these diet and lifestyle recommendations do not help, your health care provider may recommend taking  over-the-counter laxative medicines to help you have bowel movements. Prescription medicines may be prescribed if over-the-counter medicines do not work.  HOME CARE INSTRUCTIONS  Eat foods that have a lot of fiber, such as fruits, vegetables, whole grains, and beans. Limit foods high in fat and processed sugars, such as french fries, hamburgers, cookies, candies, and soda.  A fiber supplement may be added to your diet if you cannot get enough fiber from foods.  Drink enough fluids to keep your urine clear or pale yellow.  Exercise regularly or as directed by your health care provider.  Go to the restroom when you have the urge to go. Do not hold it.  Only take over-the-counter or prescription medicines as directed by your health care provider. Do not take other medicines for constipation without talking to your health care provider first.  SEEK IMMEDIATE MEDICAL CARE IF:  You have bright red blood in your stool.  Your constipation lasts for more than 4 days or gets worse.  You have abdominal or rectal pain.  You have thin, pencil-like stools.  You have unexplained weight loss. MAKE SURE YOU:  Understand these instructions. Will watch your condition. Will get help right away if you are not doing well or get worse. Document Released: 12/24/2003 Document Revised: 04/01/2013 Document Reviewed: 01/06/2013 [facility] Patient Information 2015 [place] [place]. This information is not intended to replace advice given to you by your health care provider. Make sure you discuss any questions you have with your health care provider. Vasovagal Syncope, Adult Syncope, commonly known as fainting, is a temporary loss of consciousness. It occurs when the blood flow to the brain is reduced.  Vasovagal syncope (also called neurocardiogenic syncope) is a fainting spell in which the blood flow to the brain is reduced because of a sudden drop in heart rate and blood pressure. Vasovagal syncope occurs when the  brain and the cardiovascular system (blood vessels) do not adequately communicate and respond to each other. This is the most common cause of fainting. It often occurs in response to fear or some other type of emotional or physical stress. The body has a reaction in which the heart starts beating too slowly or the blood vessels expand, reducing blood pressure. This type of fainting spell is generally considered harmless. However, injuries can occur if a person takes a sudden fall during a fainting spell.  CAUSES  Vasovagal syncope occurs when a person's blood pressure and heart rate decrease suddenly, usually in response to a trigger. Many things and situations can trigger an episode. Some of these include:   Pain.   Fear.   The sight of blood or medical procedures, such as blood being drawn from a vein.   Common activities, such as coughing, swallowing, stretching, or going to the bathroom.   Emotional stress.   Prolonged standing, especially in a warm environment.   Lack of sleep or rest.   Prolonged lack of food.   Prolonged lack of fluids.   Recent illness.  The use of certain drugs that affect blood pressure, such as cocaine, alcohol, marijuana, inhalants, and opiates.  SYMPTOMS  Before the fainting episode, you may:   Feel dizzy or light headed.   Become pale.  Sense that you are going to faint.   Feel like the room is spinning.   Have tunnel vision, only seeing directly in front of you.   Feel sick to your stomach (nauseous).   See spots or slowly lose vision.   Hear ringing in your ears.   Have a headache.   Feel warm and sweaty.   Feel a sensation of pins and needles. During the fainting spell, you will generally be unconscious for no longer than a couple minutes before waking up and returning to normal. If you get up too quickly before your body can recover, you may faint again. Some twitching or jerky movements may occur during the  fainting spell.  DIAGNOSIS  Your caregiver will ask about your symptoms, take a medical history, and perform a physical exam. Various tests may be done to rule out other causes of fainting. These may include blood tests and tests to check the heart, such as electrocardiography, echocardiography, and possibly an electrophysiology study. When other causes have been ruled out, a test may be done to check the body's response to changes in position (tilt table test). TREATMENT  Most cases of vasovagal syncope do not require treatment. Your caregiver may recommend ways to avoid fainting triggers and may provide home strategies for preventing fainting. If you must be exposed to a possible trigger, you can drink additional fluids to help reduce your chances of having an episode of vasovagal syncope. If you have warning signs of an oncoming episode, you can respond by positioning yourself favorably (lying down). If your fainting spells continue, you may be given medicines to prevent fainting. Some medicines may help make you more resistant to repeated episodes of vasovagal syncope. Special exercises or compression stockings may be recommended. In rare cases, the surgical placement of a pacemaker is considered. HOME CARE INSTRUCTIONS   Learn to identify the warning signs of vasovagal syncope.   Sit or lie  down at the first warning sign of a fainting spell. If sitting, put your head down between your legs. If you lie down, swing your legs up in the air to increase blood flow to the brain.   Avoid hot tubs and saunas.  Avoid prolonged standing.  Drink enough fluids to keep your urine clear or pale yellow. Avoid caffeine.  Increase salt in your diet as directed by your caregiver.   If you have to stand for a long time, perform movements such as:   Crossing your legs.   Flexing and stretching your leg muscles.   Squatting.   Moving your legs.   Bending over.   Only take over-the-counter or  prescription medicines as directed by your caregiver. Do not suddenly stop any medicines without asking your caregiver first. SEEK MEDICAL CARE IF:   Your fainting spells continue or happen more frequently in spite of treatment.   You lose consciousness for more than a couple minutes.  You have fainting spells during or after exercising or after being startled.   You have new symptoms that occur with the fainting spells, such as:   Shortness of breath.  Chest pain.   Irregular heartbeat.   You have episodes of twitching or jerky movements that last longer than a few seconds.  You have episodes of twitching or jerky movements without obvious fainting. SEEK IMMEDIATE MEDICAL CARE IF:   You have injuries or bleeding after a fainting spell.   You have episodes of twitching or jerky movements that last longer than 5 minutes.   You have more than one spell of twitching or jerky movements before returning to consciousness after fainting. MAKE SURE YOU:   Understand these instructions.  Will watch your condition.  Will get help right away if you are not doing well or get worse. Document Released: 03/13/2012 Document Reviewed: 03/13/2012 [facility] Patient Information 2015 [place], [place]. This information is not intended to replace advice given to you by your health care provider. Make sure you discuss any questions you have with your health care provider.

## 2015-05-31 ENCOUNTER — Encounter (HOSPITAL_COMMUNITY): Payer: Self-pay

## 2015-05-31 DIAGNOSIS — F1310 Sedative, hypnotic or anxiolytic abuse, uncomplicated: Secondary | ICD-10-CM | POA: Insufficient documentation

## 2015-05-31 DIAGNOSIS — F1210 Cannabis abuse, uncomplicated: Secondary | ICD-10-CM | POA: Insufficient documentation

## 2015-05-31 DIAGNOSIS — F17210 Nicotine dependence, cigarettes, uncomplicated: Secondary | ICD-10-CM | POA: Insufficient documentation

## 2015-05-31 DIAGNOSIS — Z3202 Encounter for pregnancy test, result negative: Secondary | ICD-10-CM | POA: Insufficient documentation

## 2015-05-31 DIAGNOSIS — Z79899 Other long term (current) drug therapy: Secondary | ICD-10-CM | POA: Insufficient documentation

## 2015-05-31 DIAGNOSIS — R062 Wheezing: Secondary | ICD-10-CM | POA: Insufficient documentation

## 2015-05-31 LAB — COMPREHENSIVE METABOLIC PANEL
ALBUMIN: 4.2 g/dL (ref 3.5–5.0)
ALT: 12 U/L — ABNORMAL LOW (ref 14–54)
ANION GAP: 11 (ref 5–15)
AST: 18 U/L (ref 15–41)
Alkaline Phosphatase: 55 U/L (ref 38–126)
BUN: 6 mg/dL (ref 6–20)
CALCIUM: 9.4 mg/dL (ref 8.9–10.3)
CO2: 23 mmol/L (ref 22–32)
Chloride: 105 mmol/L (ref 101–111)
Creatinine, Ser: 0.61 mg/dL (ref 0.44–1.00)
GFR calc Af Amer: >60 mL/min (ref >60)
GLUCOSE: 107 mg/dL — AB (ref 65–99)
POTASSIUM: 3.8 mmol/L (ref 3.5–5.1)
Sodium: 139 mmol/L (ref 135–145)
Total Bilirubin: 0.5 mg/dL (ref 0.3–1.2)
Total Protein: 7.2 g/dL (ref 6.5–8.1)

## 2015-05-31 LAB — CBC
HEMATOCRIT: 37.7 % (ref 36.0–46.0)
HEMOGLOBIN: 12.7 g/dL (ref 12.0–15.0)
MCH: 32.7 pg (ref 26.0–34.0)
MCHC: 33.7 g/dL (ref 30.0–36.0)
MCV: 97.2 fL (ref 78.0–100.0)
Platelets: 366 10*3/uL (ref 150–400)
RBC: 3.88 MIL/uL (ref 3.87–5.11)
RDW: 13 % (ref 11.5–15.5)
WBC: 6.4 10*3/uL (ref 4.0–10.5)

## 2015-05-31 LAB — RAPID URINE DRUG SCREEN, HOSP PERFORMED
Amphetamines: NOT DETECTED
BENZODIAZEPINES: POSITIVE — AB
Barbiturates: NOT DETECTED
Cocaine: NOT DETECTED
OPIATES: NOT DETECTED
Tetrahydrocannabinol: POSITIVE — AB

## 2015-05-31 LAB — ETHANOL: Alcohol, Ethyl (B): <5 mg/dL (ref <5)

## 2015-05-31 NOTE — ED Notes (Signed)
Pt independently ambulated to nurses station with steady gait and requested a "fall risk bracelet." When asked why she needed one she responded, "Because I feel like I need one. Im dizzy." This RN assisted patient to a chair in the waiting room and she was able to ambulate with steady gait then as well. Bracelet provided to patient due to request but was told to not get up if she felt dizzy. About 30 minutes later, patient seen walking around in waiting room to the bathroom and water fountain with steady gait.

## 2015-05-31 NOTE — ED Notes (Signed)
Pt here for detox from xanax and heroin. Used heroin and cocaine two days ago. Took 8 blues on the way over here, and took 7-8 bars and a few more blues over the course of the day.

## 2015-06-01 ENCOUNTER — Emergency Department (HOSPITAL_COMMUNITY)
Admission: EM | Admit: 2015-06-01 | Discharge: 2015-06-01 | Discharge status: Against Medical Advice | Payer: PRIVATE HEALTH INSURANCE | Attending: Emergency Medicine | Admitting: Emergency Medicine

## 2015-06-01 ENCOUNTER — Emergency Department (HOSPITAL_COMMUNITY): Payer: Self-pay

## 2015-06-01 DIAGNOSIS — F1310 Sedative, hypnotic or anxiolytic abuse, uncomplicated: Secondary | ICD-10-CM

## 2015-06-01 LAB — CBC WITH DIFFERENTIAL/PLATELET
BASOS ABS: 0 10*3/uL (ref 0.0–0.1)
Basophils Relative: 0 %
EOS ABS: 0.1 10*3/uL (ref 0.0–0.7)
EOS PCT: 1 %
HCT: 37.2 % (ref 36.0–46.0)
Hemoglobin: 12.3 g/dL (ref 12.0–15.0)
Lymphocytes Relative: 37 %
Lymphs Abs: 2.3 10*3/uL (ref 0.7–4.0)
MCH: 32.5 pg (ref 26.0–34.0)
MCHC: 33.1 g/dL (ref 30.0–36.0)
MCV: 98.2 fL (ref 78.0–100.0)
Monocytes Absolute: 0.5 10*3/uL (ref 0.1–1.0)
Monocytes Relative: 8 %
Neutro Abs: 3.3 10*3/uL (ref 1.7–7.7)
Neutrophils Relative %: 54 %
PLATELETS: 363 10*3/uL (ref 150–400)
RBC: 3.79 MIL/uL — AB (ref 3.87–5.11)
RDW: 13.2 % (ref 11.5–15.5)
WBC: 6.2 10*3/uL (ref 4.0–10.5)

## 2015-06-01 LAB — URINALYSIS, ROUTINE W REFLEX MICROSCOPIC
Bilirubin Urine: NEGATIVE
Glucose, UA: NEGATIVE mg/dL
Ketones, ur: NEGATIVE mg/dL
NITRITE: POSITIVE — AB
PROTEIN: NEGATIVE mg/dL
Specific Gravity, Urine: 1.018 (ref 1.005–1.030)
pH: 5.5 (ref 5.0–8.0)

## 2015-06-01 LAB — URINE MICROSCOPIC-ADD ON

## 2015-06-01 LAB — I-STAT TROPONIN, ED: TROPONIN I, POC: 0 ng/mL (ref 0.00–0.08)

## 2015-06-01 LAB — POC URINE PREG, ED: PREG TEST UR: NEGATIVE

## 2015-06-01 MED ORDER — IPRATROPIUM BROMIDE 0.02 % IN SOLN
0.5000 mg | Freq: Once | RESPIRATORY_TRACT | Status: AC
  Administered 2015-06-01: 0.5 mg via RESPIRATORY_TRACT
  Filled 2015-06-01: qty 2.5

## 2015-06-01 MED ORDER — LORAZEPAM 1 MG PO TABS
2.0000 mg | ORAL_TABLET | Freq: Once | ORAL | Status: AC
  Administered 2015-06-01: 2 mg via ORAL
  Filled 2015-06-01: qty 2

## 2015-06-01 MED ORDER — IPRATROPIUM-ALBUTEROL 0.5-2.5 (3) MG/3ML IN SOLN
3.0000 mL | Freq: Once | RESPIRATORY_TRACT | Status: AC
  Administered 2015-06-01: 3 mL via RESPIRATORY_TRACT
  Filled 2015-06-01: qty 3

## 2015-06-01 MED ORDER — ALBUTEROL SULFATE (2.5 MG/3ML) 0.083% IN NEBU
5.0000 mg | INHALATION_SOLUTION | Freq: Once | RESPIRATORY_TRACT | Status: AC
  Administered 2015-06-01: 5 mg via RESPIRATORY_TRACT
  Filled 2015-06-01: qty 6

## 2015-06-01 MED ORDER — PREDNISONE 20 MG PO TABS: 40.0000 mg | ORAL_TABLET | Freq: Once | ORAL | Status: DC

## 2015-06-01 MED ORDER — NICOTINE 21 MG/24HR TD PT24
21.0000 mg | MEDICATED_PATCH | Freq: Once | TRANSDERMAL | Status: DC
  Administered 2015-06-01: 21 mg via TRANSDERMAL
  Filled 2015-06-01: qty 1

## 2015-06-01 MED ORDER — CEPHALEXIN 250 MG PO CAPS: 500.0000 mg | ORAL_CAPSULE | Freq: Once | ORAL | Status: DC

## 2015-06-01 NOTE — ED Notes (Signed)
Pt stormed out of department. [facility] NP trying to talk to patient and stop her from leaving, pt continued to leave ED. No IV in place. Nicotine patch still in place.

## 2015-06-01 NOTE — ED Notes (Signed)
TTS at bedside. 
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
## 2015-06-01 NOTE — ED Notes (Signed)
Continuously connecting pt to monitor, pt takes everything off. Angelica Chessman, PA st it was fine for pt to not be connected to cardiac monitor.

## 2015-06-01 NOTE — ED Notes (Signed)
Ambulating back in pod c, escorted back to room by RN and security.

## 2015-06-01 NOTE — ED Notes (Signed)
Pt sts she cant get warm & her fingers and arm keep tingling

## 2015-06-01 NOTE — ED Notes (Signed)
Offered pt meal, declined. sts "i just don't have an appetite.I just want to go smoke a cigarette."
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
## 2015-06-01 NOTE — ED Notes (Signed)
Pt HR noted to be tachy on montior. Checked on pt, noted to be very anxious. Angelica Chessman, PA notified.

## 2015-06-03 ENCOUNTER — Emergency Department (HOSPITAL_BASED_OUTPATIENT_CLINIC_OR_DEPARTMENT_OTHER)
Admission: EM | Admit: 2015-06-03 | Discharge: 2015-06-03 | Disposition: A | Discharge status: Other Acute Inpatient Hospital | Payer: Self-pay | Attending: Emergency Medicine | Admitting: Emergency Medicine

## 2015-06-03 ENCOUNTER — Inpatient Hospital Stay (HOSPITAL_COMMUNITY)
Admission: EM | Admit: 2015-06-03 | Discharge: 2015-06-03 | DRG: 897 | Disposition: A | Discharge status: Home / Self Care | Payer: Federal, State, Local not specified - Other | Source: Intra-hospital | Attending: Psychiatry | Admitting: Psychiatry

## 2015-06-03 ENCOUNTER — Encounter (HOSPITAL_COMMUNITY): Payer: Self-pay | Admitting: *Deleted

## 2015-06-03 DIAGNOSIS — F1120 Opioid dependence, uncomplicated: Principal | ICD-10-CM | POA: Diagnosis present

## 2015-06-03 DIAGNOSIS — F1210 Cannabis abuse, uncomplicated: Secondary | ICD-10-CM | POA: Insufficient documentation

## 2015-06-03 DIAGNOSIS — F17210 Nicotine dependence, cigarettes, uncomplicated: Secondary | ICD-10-CM | POA: Diagnosis present

## 2015-06-03 DIAGNOSIS — R45851 Suicidal ideations: Secondary | ICD-10-CM | POA: Diagnosis present

## 2015-06-03 DIAGNOSIS — F319 Bipolar disorder, unspecified: Secondary | ICD-10-CM | POA: Diagnosis present

## 2015-06-03 DIAGNOSIS — Z3202 Encounter for pregnancy test, result negative: Secondary | ICD-10-CM | POA: Insufficient documentation

## 2015-06-03 DIAGNOSIS — G4700 Insomnia, unspecified: Secondary | ICD-10-CM | POA: Insufficient documentation

## 2015-06-03 DIAGNOSIS — F1910 Other psychoactive substance abuse, uncomplicated: Secondary | ICD-10-CM

## 2015-06-03 DIAGNOSIS — R4689 Other symptoms and signs involving appearance and behavior: Secondary | ICD-10-CM

## 2015-06-03 DIAGNOSIS — F1310 Sedative, hypnotic or anxiolytic abuse, uncomplicated: Secondary | ICD-10-CM | POA: Insufficient documentation

## 2015-06-03 DIAGNOSIS — F1920 Other psychoactive substance dependence, uncomplicated: Secondary | ICD-10-CM | POA: Diagnosis not present

## 2015-06-03 DIAGNOSIS — R000 Tachycardia, unspecified: Secondary | ICD-10-CM | POA: Insufficient documentation

## 2015-06-03 DIAGNOSIS — R454 Irritability and anger: Secondary | ICD-10-CM | POA: Insufficient documentation

## 2015-06-03 DIAGNOSIS — R4589 Other symptoms and signs involving emotional state: Secondary | ICD-10-CM

## 2015-06-03 DIAGNOSIS — Z79899 Other long term (current) drug therapy: Secondary | ICD-10-CM | POA: Insufficient documentation

## 2015-06-03 DIAGNOSIS — F3110 Bipolar disorder, current episode manic without psychotic features, unspecified: Secondary | ICD-10-CM | POA: Diagnosis not present

## 2015-06-03 LAB — URINALYSIS, ROUTINE W REFLEX MICROSCOPIC
GLUCOSE, UA: NEGATIVE mg/dL
KETONES UR: 40 mg/dL — AB
NITRITE: NEGATIVE
PH: 6 (ref 5.0–8.0)
Protein, ur: NEGATIVE mg/dL
Specific Gravity, Urine: 1.029 (ref 1.005–1.030)

## 2015-06-03 LAB — URINE MICROSCOPIC-ADD ON

## 2015-06-03 LAB — COMPREHENSIVE METABOLIC PANEL
ALBUMIN: 4.2 g/dL (ref 3.5–5.0)
ALK PHOS: 54 U/L (ref 38–126)
ALT: 15 U/L (ref 14–54)
AST: 15 U/L (ref 15–41)
Anion gap: 7 (ref 5–15)
BUN: 15 mg/dL (ref 6–20)
CALCIUM: 9 mg/dL (ref 8.9–10.3)
CHLORIDE: 108 mmol/L (ref 101–111)
CO2: 24 mmol/L (ref 22–32)
CREATININE: 0.56 mg/dL (ref 0.44–1.00)
GFR calc non Af Amer: >60 mL/min (ref >60)
GLUCOSE: 99 mg/dL (ref 65–99)
Potassium: 3.2 mmol/L — ABNORMAL LOW (ref 3.5–5.1)
SODIUM: 139 mmol/L (ref 135–145)
Total Bilirubin: 0.6 mg/dL (ref 0.3–1.2)
Total Protein: 7.2 g/dL (ref 6.5–8.1)

## 2015-06-03 LAB — CBC WITH DIFFERENTIAL/PLATELET
Basophils Absolute: 0 10*3/uL (ref 0.0–0.1)
Basophils Relative: 0 %
EOS PCT: 0 %
Eosinophils Absolute: 0 10*3/uL (ref 0.0–0.7)
HCT: 36.7 % (ref 36.0–46.0)
HEMOGLOBIN: 12.4 g/dL (ref 12.0–15.0)
LYMPHS ABS: 2.5 10*3/uL (ref 0.7–4.0)
LYMPHS PCT: 20 %
MCH: 33.3 pg (ref 26.0–34.0)
MCHC: 33.8 g/dL (ref 30.0–36.0)
MCV: 98.7 fL (ref 78.0–100.0)
MONOS PCT: 6 %
Monocytes Absolute: 0.8 10*3/uL (ref 0.1–1.0)
Neutro Abs: 9.1 10*3/uL — ABNORMAL HIGH (ref 1.7–7.7)
Neutrophils Relative %: 74 %
PLATELETS: 363 10*3/uL (ref 150–400)
RBC: 3.72 MIL/uL — AB (ref 3.87–5.11)
RDW: 12.7 % (ref 11.5–15.5)
WBC: 12.4 10*3/uL — AB (ref 4.0–10.5)

## 2015-06-03 LAB — RAPID URINE DRUG SCREEN, HOSP PERFORMED
AMPHETAMINES: NOT DETECTED
Barbiturates: NOT DETECTED
Benzodiazepines: POSITIVE — AB
Cocaine: NOT DETECTED
Opiates: NOT DETECTED
TETRAHYDROCANNABINOL: POSITIVE — AB

## 2015-06-03 LAB — SALICYLATE LEVEL

## 2015-06-03 LAB — ETHANOL: Alcohol, Ethyl (B): <5 mg/dL (ref <5)

## 2015-06-03 LAB — PREGNANCY, URINE: PREG TEST UR: NEGATIVE

## 2015-06-03 LAB — ACETAMINOPHEN LEVEL

## 2015-06-03 MED ORDER — LORAZEPAM 1 MG PO TABS
1.0000 mg | ORAL_TABLET | Freq: Once | ORAL | Status: AC
  Administered 2015-06-03: 1 mg via ORAL
  Filled 2015-06-03: qty 1

## 2015-06-03 MED ORDER — ONDANSETRON 4 MG PO TBDP: 4.0000 mg | ORAL_TABLET | Freq: Four times a day (QID) | ORAL | Status: DC | PRN

## 2015-06-03 MED ORDER — HYDROXYZINE HCL 25 MG PO TABS: 25.0000 mg | ORAL_TABLET | Freq: Four times a day (QID) | ORAL | Status: DC | PRN

## 2015-06-03 MED ORDER — DICYCLOMINE HCL 20 MG PO TABS
20.0000 mg | ORAL_TABLET | Freq: Four times a day (QID) | ORAL | Status: DC | PRN
  Administered 2015-06-03: 20 mg via ORAL
  Filled 2015-06-03: qty 1

## 2015-06-03 MED ORDER — METHOCARBAMOL 500 MG PO TABS: 500.0000 mg | ORAL_TABLET | Freq: Three times a day (TID) | ORAL | Status: DC | PRN

## 2015-06-03 MED ORDER — ZIPRASIDONE MESYLATE 20 MG IM SOLR: 10.0000 mg | Freq: Once | INTRAMUSCULAR | Status: DC

## 2015-06-03 MED ORDER — CARBAMAZEPINE ER 100 MG PO TB12
100.0000 mg | ORAL_TABLET | Freq: Two times a day (BID) | ORAL | Status: DC
  Administered 2015-06-03: 100 mg via ORAL
  Filled 2015-06-03 (×6): qty 1

## 2015-06-03 MED ORDER — METHOCARBAMOL 500 MG PO TABS
500.0000 mg | ORAL_TABLET | Freq: Three times a day (TID) | ORAL | Status: DC | PRN
  Administered 2015-06-03: 500 mg via ORAL
  Filled 2015-06-03: qty 1

## 2015-06-03 MED ORDER — NAPROXEN 500 MG PO TABS
500.0000 mg | ORAL_TABLET | Freq: Two times a day (BID) | ORAL | Status: DC | PRN
Start: 2015-06-03 — End: 2015-06-03

## 2015-06-03 MED ORDER — LORAZEPAM 1 MG PO TABS: 0.0000 mg | ORAL_TABLET | Freq: Four times a day (QID) | ORAL | Status: DC

## 2015-06-03 MED ORDER — ADULT MULTIVITAMIN W/MINERALS CH
1.0000 | ORAL_TABLET | Freq: Every day | ORAL | Status: DC
  Administered 2015-06-03: 1 via ORAL
  Filled 2015-06-03 (×3): qty 1

## 2015-06-03 MED ORDER — LORAZEPAM 1 MG PO TABS
1.0000 mg | ORAL_TABLET | Freq: Two times a day (BID) | ORAL | Status: DC
Start: 2015-06-05 — End: 2015-06-03

## 2015-06-03 MED ORDER — LOPERAMIDE HCL 2 MG PO CAPS: 2.0000 mg | ORAL_CAPSULE | ORAL | Status: DC | PRN

## 2015-06-03 MED ORDER — CLONIDINE HCL 0.1 MG PO TABS: 0.1000 mg | ORAL_TABLET | ORAL | Status: DC

## 2015-06-03 MED ORDER — LORAZEPAM 1 MG PO TABS: 1.0000 mg | ORAL_TABLET | Freq: Three times a day (TID) | ORAL | Status: DC

## 2015-06-03 MED ORDER — ALUM & MAG HYDROXIDE-SIMETH 200-200-20 MG/5ML PO SUSP
30.0000 mL | ORAL | Status: DC | PRN
  Administered 2015-06-03: 30 mL via ORAL
  Filled 2015-06-03: qty 30

## 2015-06-03 MED ORDER — CLONIDINE HCL 0.1 MG PO TABS: 0.1000 mg | ORAL_TABLET | Freq: Every day | ORAL | Status: DC

## 2015-06-03 MED ORDER — LORAZEPAM 1 MG PO TABS
1.0000 mg | ORAL_TABLET | Freq: Four times a day (QID) | ORAL | Status: DC
  Administered 2015-06-03 (×2): 1 mg via ORAL
  Filled 2015-06-03 (×2): qty 1

## 2015-06-03 MED ORDER — CLONIDINE HCL 0.1 MG PO TABS
0.1000 mg | ORAL_TABLET | Freq: Four times a day (QID) | ORAL | Status: DC
  Administered 2015-06-03: 0.1 mg via ORAL
  Filled 2015-06-03 (×9): qty 1

## 2015-06-03 MED ORDER — NICOTINE 21 MG/24HR TD PT24
21.0000 mg | MEDICATED_PATCH | Freq: Every day | TRANSDERMAL | Status: DC
  Administered 2015-06-03: 21 mg via TRANSDERMAL
  Filled 2015-06-03 (×2): qty 1

## 2015-06-03 MED ORDER — CIPROFLOXACIN HCL 500 MG PO TABS
500.0000 mg | ORAL_TABLET | Freq: Two times a day (BID) | ORAL | Status: DC
  Administered 2015-06-03: 500 mg via ORAL
  Filled 2015-06-03 (×4): qty 1
  Filled 2015-06-03: qty 2
  Filled 2015-06-03: qty 1

## 2015-06-03 MED ORDER — IBUPROFEN 800 MG PO TABS
800.0000 mg | ORAL_TABLET | Freq: Once | ORAL | Status: AC
  Administered 2015-06-03: 800 mg via ORAL
  Filled 2015-06-03: qty 1

## 2015-06-03 MED ORDER — CLONIDINE HCL 0.1 MG PO TABS
0.1000 mg | ORAL_TABLET | Freq: Once | ORAL | Status: AC
  Administered 2015-06-03: 0.1 mg via ORAL
  Filled 2015-06-03 (×2): qty 1

## 2015-06-03 MED ORDER — DICYCLOMINE HCL 20 MG PO TABS: 20.0000 mg | ORAL_TABLET | Freq: Four times a day (QID) | ORAL | Status: DC | PRN

## 2015-06-03 MED ORDER — NICOTINE 21 MG/24HR TD PT24
21.0000 mg | MEDICATED_PATCH | Freq: Once | TRANSDERMAL | Status: DC
  Administered 2015-06-03: 21 mg via TRANSDERMAL
  Filled 2015-06-03: qty 1

## 2015-06-03 MED ORDER — LORAZEPAM 1 MG PO TABS
1.0000 mg | ORAL_TABLET | Freq: Four times a day (QID) | ORAL | Status: DC | PRN
  Administered 2015-06-03: 1 mg via ORAL
  Filled 2015-06-03 (×2): qty 1

## 2015-06-03 MED ORDER — ZIPRASIDONE MESYLATE 20 MG IM SOLR: 20.0000 mg | Freq: Once | INTRAMUSCULAR | Status: DC

## 2015-06-03 MED ORDER — MAGNESIUM HYDROXIDE 400 MG/5ML PO SUSP
30.0000 mL | Freq: Every day | ORAL | Status: DC | PRN
Start: 2015-06-03 — End: 2015-06-03

## 2015-06-03 MED ORDER — LORAZEPAM 1 MG PO TABS: 0.0000 mg | ORAL_TABLET | Freq: Two times a day (BID) | ORAL | Status: DC

## 2015-06-03 MED ORDER — NAPROXEN 500 MG PO TABS
500.0000 mg | ORAL_TABLET | Freq: Two times a day (BID) | ORAL | Status: DC | PRN
  Administered 2015-06-03: 500 mg via ORAL
  Filled 2015-06-03: qty 1

## 2015-06-03 MED ORDER — LORAZEPAM 1 MG PO TABS: 1.0000 mg | ORAL_TABLET | Freq: Every day | ORAL | Status: DC

## 2015-06-03 MED ORDER — ONDANSETRON 4 MG PO TBDP
4.0000 mg | ORAL_TABLET | Freq: Four times a day (QID) | ORAL | Status: DC | PRN
  Administered 2015-06-03: 4 mg via ORAL
  Filled 2015-06-03: qty 1

## 2015-06-03 NOTE — [facility] Group Notes (Signed)
The focus of this group is to educate the patient on the purpose and policies of crisis stabilization and provide a format to answer questions about their admission.  The group details unit policies and expectations of patients while admitted.  Patient attended 0900 nurse education orientation group this morning.  Patient left group and then returned.  Patient actively participated, inappropriate conversation in group.  Became angry at another patient and left group.  Returned to group and discussed her personal experiences with depression and substance abuse.  Today patient will work on 3 goals for discharge.

## 2015-06-03 NOTE — Progress Notes (Signed)
Discharge note:  Patient demanded to be discharged.  Patient had been arguing with another patient on 300 hall.  She was yelling and cursing.  She paced up and down the hallway, banging on the medication window.  She demanded to get all of her prns.  Patient became increasingly agitated throughout the morning and staff was unable to calm her down.  The psychiatrist also spoke with her concerning her behavior.  Other patients on the hallway were also getting agitated because of her behavior.  Patient also argued with the AA speaker and disrupted group.  Patient received her dose of ativan and clonidine and then started yelling, "I just ought to get the hell outta here.  I'm not getting any damn help here!"  i explained to patient that if she really wanted to leave, I would speak to the doctor.  She continued to escalate and demanded to leave.  MD was consulted and he gave order for discharge and SRA.  Patient had to be escorted off the unit with [facility], security, MHT and nurse.  Patient continued to yell and cursing going out the door.

## 2015-06-03 NOTE — ED Notes (Addendum)
Pt verbal;ized polysubstance abuse ETOH and Pot also states having thoughts of sucide

## 2015-06-03 NOTE — Tx Team (Signed)
Initial Interdisciplinary Treatment Plan   PATIENT STRESSORS: Financial difficulties Medication change or noncompliance Substance abuse   PATIENT STRENGTHS: Ability for insight Average or above average intelligence Capable of independent living Communication skills General fund of knowledge Supportive family/friends   PROBLEM LIST: Problem List/Patient Goals Date to be addressed Date deferred Reason deferred Estimated date of resolution  "SI, walk into traffic" 06/03/15     "need detox" 06/03/15     "anger" 06/03/15     "depression" 06/03/15                                    DISCHARGE CRITERIA:  Improved stabilization in mood, thinking, and/or behavior Need for constant or close observation no longer present Reduction of life-threatening or endangering symptoms to within safe limits Safe-care adequate arrangements made Verbal commitment to aftercare and medication compliance Withdrawal symptoms are absent or subacute and managed without 24-hour nursing intervention  PRELIMINARY DISCHARGE PLAN: Attend PHP/IOP Attend 12-step recovery group Outpatient therapy Return to previous living arrangement  PATIENT/FAMIILY INVOLVEMENT: This treatment plan has been presented to and reviewed with the patient, [name], and/or family member. The patient and family have been given the opportunity to ask questions and make suggestions.  Mickeal Needy 06/03/2015, 6:15 AM

## 2015-06-03 NOTE — ED Provider Notes (Addendum)
CSN: [id number]     Arrival date & time 06/03/15  0110 History   First MD Initiated Contact with Patient 06/03/15 0135     Chief Complaint  Patient presents with  . Addiction Problem  . Suicidal     (Consider location/radiation/quality/duration/timing/severity/associated sxs/prior Treatment) HPI Comments: Patient is a 35 year old female with a history of depression, bipolar disease, anxiety, polysubstance abuse who is seen in the emergency room approximately 6 days ago for similar symptoms however now in addition to her excessive substance use she is having suicidal thoughts and attempts. Since leaving the emergency room before having a behavioral health evaluation patient has tried to commit suicide twice. She is walked into traffic and took an overdose of Xanax, Unisom and alcohol. Patient states she just doesn't one to live anymore and her substance use is out of control. She has not been on any psychiatric medications for over 2 years. Her last psychiatric hospitalization was 3 years ago. Patient states she's taking 8 mg or more of Xanax daily and took 4 mg approximate 1-2 hours prior to arrival. Patient also admits to using heroin 3-4 days ago and states she'll use several times a month but not daily. She also endorses cocaine use and marijuana use. When she does not have Xanax she feels shaky, anxious, sweaty. She denies any Tylenol or salicylate overdose. No fever or URI symptoms.  Patient is a 36 y.o. female presenting with mental health disorder. The history is provided by the patient.  Mental Health Problem Presenting symptoms: depression and suicide attempt   Presenting symptoms comment:  Substance abuse problem Patient accompanied by:  Family member Degree of incapacity (severity):  Severe Onset quality:  Gradual Timing:  Constant Progression:  Worsening Chronicity:  Recurrent Context: alcohol use, drug abuse and noncompliance   Treatment compliance:  Untreated Time since last  psychoactive medication taken:  2 years Relieved by:  None tried Associated symptoms: anhedonia, feelings of worthlessness, insomnia, irritability and poor judgment   Risk factors: hx of mental illness and hx of suicide attempts   Risk factors: no recent psychiatric admission     Past Medical History  Diagnosis Date  . Depression   . Bipolar 1 disorder (HCC)   . Anxiety   . Drug abuse   . ETOH abuse   . Polysubstance abuse    No past surgical history on file. No family history on file. Social History  Substance Use Topics  . Smoking status: Current Every Day Smoker -- 2.00 packs/day for 17 years    Types: Cigarettes  . Smokeless tobacco: Never Used  . Alcohol Use: 7.2 oz/week    12 Cans of beer per week     Comment: 12 pack of beer per day plus liquor or wine, whatever is available and fits in her pocket at work   OB History    No data available     Review of Systems  Constitutional: Positive for irritability.  Psychiatric/Behavioral: The patient has insomnia.   All other systems reviewed and are negative.     Allergies  Review of patient's allergies indicates no known allergies.  Home Medications   Prior to Admission medications   Medication Sig Start Date End Date Taking? Authorizing Provider  cetirizine (ZYRTEC ALLERGY) 10 MG tablet Take 10 mg by mouth daily.    Historical Provider, MD  Diphenhydramine-APAP, sleep, (GOODY PM PO) Take 1-3 packets by mouth at bedtime as needed (sleep).     Historical Provider, MD  ibuprofen (ADVIL,MOTRIN)  200 MG tablet Take 600 mg by mouth every 8 (eight) hours as needed (pain).    Historical Provider, MD  omeprazole (PRILOSEC) 20 MG capsule Take 1 capsule (20 mg total) by mouth daily. 11/05/13   Everardo All Withrow, FNP   BP 120/87 mmHg  Pulse 101  Temp(Src) 98.3 F (36.8 C) (Oral)  Resp 18  SpO2 98%  LMP 05/28/2015 (Approximate) Physical Exam  Constitutional: She is oriented to person, place, and time. She appears  well-developed and well-nourished. No distress.  Disheveled and unkempt  HENT:  Head: Normocephalic and atraumatic.  Mouth/Throat: Oropharynx is clear and moist.  Eyes: Conjunctivae and EOM are normal.  Pupils dilated at 4 cm and reactive bilaterally  Neck: Normal range of motion. Neck supple.  Cardiovascular: Regular rhythm and intact distal pulses.  Tachycardia present.   No murmur heard. Pulmonary/Chest: Effort normal and breath sounds normal. No respiratory distress. She has no wheezes. She has no rales.  Abdominal: Soft. She exhibits no distension. There is no tenderness. There is no rebound and no guarding.  Musculoskeletal: Normal range of motion. She exhibits no edema or tenderness.  Neurological: She is alert and oriented to person, place, and time.  Skin: Skin is warm and dry. No rash noted. No erythema.  Psychiatric: Her behavior is normal. Her affect is inappropriate. Her speech is tangential. She is not actively hallucinating. She expresses inappropriate judgment. She expresses suicidal ideation. She expresses suicidal plans.  Patient has attempted to walk into traffic and overdose on Xanax, Unisom and alcohol. She will have flights of ideas and decreased need for sleep at times. She currently is endorsing suicidality.  Nursing note and vitals reviewed.   ED Course  Procedures (including critical care time) Labs Review Labs Reviewed  COMPREHENSIVE METABOLIC PANEL - Abnormal; Notable for the following:    Potassium 3.2 (*)    All other components within normal limits  CBC WITH DIFFERENTIAL/PLATELET - Abnormal; Notable for the following:    WBC 12.4 (*)    RBC 3.72 (*)    Neutro Abs 9.1 (*)    All other components within normal limits  URINE RAPID DRUG SCREEN, HOSP PERFORMED - Abnormal; Notable for the following:    Benzodiazepines POSITIVE (*)    Tetrahydrocannabinol POSITIVE (*)    All other components within normal limits  ACETAMINOPHEN LEVEL - Abnormal; Notable for  the following:    Acetaminophen (Tylenol), Serum <10 (*)    All other components within normal limits  URINALYSIS, ROUTINE W REFLEX MICROSCOPIC (NOT AT [facility]) - Abnormal; Notable for the following:    Color, Urine ORANGE (*)    APPearance TURBID (*)    Hgb urine dipstick SMALL (*)    Bilirubin Urine SMALL (*)    Ketones, ur 40 (*)    Leukocytes, UA SMALL (*)    All other components within normal limits  URINE MICROSCOPIC-ADD ON - Abnormal; Notable for the following:    Squamous Epithelial / LPF 6-30 (*)    Bacteria, UA FEW (*)    All other components within normal limits  ETHANOL  SALICYLATE LEVEL  PREGNANCY, URINE    Imaging Review Dg Chest 2 View  06/01/2015  CLINICAL DATA:  Cough, chills.  Addiction problem. EXAM: CHEST  2 VIEW COMPARISON:  08/25/2006 FINDINGS: The heart size and mediastinal contours are within normal limits. Both lungs are clear. The visualized skeletal structures are unremarkable. IMPRESSION: No active cardiopulmonary disease. Electronically Signed   By: Charlett Nose M.D.  On: 06/01/2015 07:04   I have personally reviewed and evaluated these images and lab results as part of my medical decision-making.   EKG Interpretation None      MDM   Final diagnoses:  Suicidal behavior  Polysubstance abuse    Patient with significant history of polysubstance abuse and heavy Xanax and marijuana use presents today with desire for detox but also suicidal ideation. Patient states in the last 4 days she has had multiple attempts at suicide. One with overdose with Xanax, alcohol or sleeping medication and a second episode where she tried to walk out into traffic. Patient does have a history of bipolar disease, borderline personality and depression. She has not been on medications for years. Last psychiatric hospitalization was a proximally 3 years ago. Patient last took 4 mg of Xanax approximately 1-2 hours ago. She does appear high with dilated pupils. She is awake and  alert and still endorsing suicidality. She is intermittently complaining of back pain which she states comes and goes depending how much Xanax she takes. Patient does intermittently use IV heroin last use was 4 days ago. She denies any fever, cough or URI symptoms. She has no medication allergies.  Medical screening labs sent and TTS to evaluate the patient  2:42 AM Labs without acute findings other than postiive UDS for benzo's and THC.  Psych eval pending.  Pt is medically clear. No evidence of UTI.  Gwyneth Sprout, MD 06/03/15 0454  Gwyneth Sprout, MD 06/03/15 [phone]

## 2015-06-03 NOTE — [facility] Suicide Risk Assessment (Signed)
[facility] INPATIENT:  Family/Significant Other Suicide Prevention Education  Suicide Prevention Education:  Patient Refusal for Family/Significant Other Suicide Prevention Education: The patient [name] has refused to provide written consent for family/significant other to be provided Family/Significant Other Suicide Prevention Education during admission and/or prior to discharge.  Physician notified.  Shaquira Moroz, [place] 06/03/2015, 1:15 PM

## 2015-06-03 NOTE — Discharge Summary (Signed)
Physician Discharge Summary Note  Patient:  [name] is an 35 y.o., female  MRN:  [id number]  DOB:  May 14, 1980  Patient phone:  [phone] (home)   Patient address:   [address] [place] [address],   Total Time spent with patient: Greater than 30 minutes  Date of Admission:  06/03/2015  Date of Discharge: 06-03-15  Reason for Admission: (Substance abuse/dependence): Wants to get off of drugs  Principal Problem: Bipolar I disorder, most recent episode (or current) manic, unspecified, Polysubstance dependence including opioid type drug, episodic abuse.  Discharge Diagnoses: Patient Active Problem List   Diagnosis Date Noted  . Polysubstance dependence including opioid type drug, episodic abuse (HCC) [F11.20, F19.20] 06/03/2015  . Polysubstance abuse [F19.10] 11/01/2013  . 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 Bipolar I disorder, most recent episode (or current) manic, unspecified [F31.10] 02/25/2013  . Substance abuse/dependence [IMO0002] 02/21/2013   Past Psychiatric History: Polysubstance dependence, Bipolar 1 disorder  Past Medical History:  Past Medical History  Diagnosis Date  . Depression   . Bipolar 1 disorder (HCC)   . Anxiety   . Drug abuse   . ETOH abuse   . Polysubstance abuse    History reviewed. No pertinent past surgical history.  Family History: History reviewed. No pertinent family history.  Family Psychiatric  History: See H&P  Social History:  History  Alcohol Use  . 7.2 oz/week  . 12 Cans of beer per week    Comment: 12 pack of beer per day plus liquor or wine, whatever is available and fits in her pocket at work     History  Drug Use  . 7.00 per week  . Special: Heroin, Benzodiazepines, Marijuana, IV    Comment: Any type of benzo she can get first thing in the morning and continue all day, THC 5-7 times per day    Social History   Social History  . Marital Status: Single    Spouse Name:  N/A  . Number of Children: N/A  . Years of Education: N/A   Social History Main Topics  . Smoking status: Current Every Day Smoker -- 2.00 packs/day for 17 years    Types: Cigarettes  . Smokeless tobacco: Never Used  . Alcohol Use: 7.2 oz/week    12 Cans of beer per week     Comment: 12 pack of beer per day plus liquor or wine, whatever is available and fits in her pocket at work  . Drug Use: 7.00 per week    Special: Heroin, Benzodiazepines, Marijuana, IV     Comment: Any type of benzo she can get first thing in the morning and continue all day, THC 5-7 times per day  . Sexual Activity: No   Other Topics Concern  . None   Social History Narrative   Hospital Course: 35 Y/O has been using Xanax. Wants to get off as she can't control it. Has been taking up to "20 pills a day" this time around has been on them for 6 months. She is not consistent with the number of pills she reports taking Stress money issues fighting with best friend, dog died, father had a stroke. States she does not have much hope, she is depressed. States she was doing heroin too. Started 6 months ago real heavy for couple of weeks before she came here. She is also using pot. States she has major anger issues switches moods up and down. States she thinks bad things about herself  negative thoughts. Depression worst for the last 2 years.  [name]'s stay in this hospital was rather very brief. She came in to the hospital requesting help getting off of drugs. She reported vegetative symptoms such as; not having much hope & having to deal with familial stressors as well as fighting with her best friend. She has been using multiple drugs. Her UDS test results were positive for Benzodiazepine, THC & she admitted using heroin as well. After admission assessment, her symptoms were evaluated & noted. Medication regimen targeting those symptoms were discussed. However, [name] decided that she does no longer want to stay in the  hospital. She has asked to be discharged to continue treatment on outpatient basis. She presented no other significant pre-existing medical issues that required treatment & or monitoring.    [name] says with an outpatient treatment, that she is committed to abstaining from substances. During this assessment time, she appears much more in control of her mood & behavior. She is not presenting with any substance withdrawal symptoms.  Her symptoms were reported as significantly improved by [name]. There are currently, no active SI plans or intent, AVH, delusional thoughts or paranoia. She is going to pursue outpatient treatment, but declines a referral for outpatient services . [name] left [facility] with all personal belongings in no apparent distress. Transportation per her arrangement.  Physical Findings: AIMS: Facial and Oral Movements Muscles of Facial Expression: None, normal Lips and Perioral Area: None, normal Jaw: None, normal Tongue: None, normal,Extremity Movements Upper (arms, wrists, hands, fingers): None, normal Lower (legs, knees, ankles, toes): None, normal, Trunk Movements Neck, shoulders, hips: None, normal, Overall Severity Severity of abnormal movements (highest score from questions above): None, normal Incapacitation due to abnormal movements: None, normal Patient's awareness of abnormal movements (rate only patient's report): No Awareness, Dental Status Current problems with teeth and/or dentures?: No Does patient usually wear dentures?: No  CIWA:  CIWA-Ar Total: 2 COWS:  COWS Total Score: 4  Musculoskeletal: Strength & Muscle Tone: within normal limits Gait & Station: normal Patient leans: N/A  Psychiatric Specialty Exam: Review of Systems  Constitutional: Negative.   HENT: Negative.   Eyes: Negative.   Respiratory: Negative.   Cardiovascular: Negative.   Gastrointestinal: Negative.   Genitourinary: Negative.   Musculoskeletal: Negative.   Skin: Negative.    Neurological: Negative.   Endo/Heme/Allergies: Negative.   Psychiatric/Behavioral: Positive for depression and substance abuse (Hx polysubstance dependence). Negative for hallucinations and memory loss. The patient is not nervous/anxious and does not have insomnia.     Blood pressure 106/70, pulse 88, temperature 98.6 F (37 C), temperature source Oral, resp. rate 18, height 5' (1.524 m), weight 80.287 kg (177 lb), last menstrual period 05/28/2015.Body mass index is 34.57 kg/(m^2).  See Md's SRA   Have you used any form of tobacco in the last 30 days? (Cigarettes, Smokeless Tobacco, Cigars, and/or Pipes): Yes  Has this patient used any form of tobacco in the last 30 days? (Cigarettes, Smokeless Tobacco, Cigars, and/or Pipes) Yes, Yes, A prescription for an FDA-approved tobacco cessation medication was offered at discharge and the patient refused  Blood Alcohol level:  Lab Results  Component Value Date   ETH <5 06/03/2015   ETH <5 05/31/2015   Metabolic Disorder Labs:  No results found for: HGBA1C, MPG No results found for: PROLACTIN No results found for: CHOL, TRIG, HDL, CHOLHDL, VLDL, LDLCALC  See Psychiatric Specialty Exam and Suicide Risk Assessment completed by Attending Physician prior to discharge.  Discharge destination:  Home  Is patient on multiple antipsychotic therapies at discharge:  No   Has Patient had three or more failed trials of antipsychotic monotherapy by history:  No  Recommended Plan for Multiple Antipsychotic Therapies: NA    Medication List    STOP taking these medications        GOODY PM PO     ibuprofen 200 MG tablet  Commonly known as:  ADVIL,MOTRIN     omeprazole 20 MG capsule  Commonly known as:  PRILOSEC     VITAMIN C PO     ZYRTEC ALLERGY 10 MG tablet  Generic drug:  cetirizine       Follow-up Information    Follow up with Patient declined referral for outpatient services. .    Follow-up recommendations:  Activity:  As  tolerated Diet: As recommended by your primary care doctor. Keep all scheduled follow-up appointments as recommended.  Comments: Take all your medications as prescribed by your mental healthcare provider. Report any adverse effects and or reactions from your medicines to your outpatient provider promptly. Patient is instructed and cautioned to not engage in alcohol and or illegal drug use while on prescription medicines. In the event of worsening symptoms, patient is instructed to call the crisis hotline, 911 and or go to the nearest ED for appropriate evaluation and treatment of symptoms. Follow-up with your primary care provider for your other medical issues, concerns and or health care needs.   Signed: Sanjuana Kava, NP, PMHNP, FNP-BC 06/04/2015, 2:03 PM  I personally assessed the patient and formulated the plan Madie Reno A. Dub Mikes, M.D.

## 2015-06-03 NOTE — ED Notes (Signed)
pateint is cooperative with the MD and the RN's at the bedside.
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
## 2015-06-03 NOTE — ED Notes (Signed)
Patient taken to [facility] with [name]. Patient is ambulatory

## 2015-06-03 NOTE — ED Notes (Signed)
Call to TTS  Berna Spare to call for assessment ASAP

## 2015-06-03 NOTE — Progress Notes (Addendum)
Patient ID: [name], female   DOB: [birth year], 35 y.o.   MRN: [id number] Client is a 35 yo female admitted for SI, had been walking into traffic "I just don't care anymore" "I need detox" Client reports use of marijuana, xanax 10-12 tabs daily, heroin 2-3 times a week, drinking 24-48 beers a week. Client [facility] from [facility] to [facility]. Client has a history of [facility] history the last admission was 7/15. Client has history of labile behavior, mood swings "anything could set me off" Client has no significant medical history although she does complain of urinary hesitance, "not getting much out" Client also report history of pneumonia. Client oriented to unit/room. Staff will monitor q11min for safety. Client is safe on the unit.

## 2015-06-03 NOTE — ED Notes (Signed)
Patient on the phone with TTS.  

## 2015-06-03 NOTE — ED Notes (Signed)
Patients valuables including cell phone and charger, given to the patient friend who brought her in. The patients friend took the belonging and reports that she was going to take them "home" meaning the patients home.  There was pack of cigarettes with an unknown amount left in the pack, $20, a lighter, the patients keys and Drivers Licence.   The patients clothes, included 1 pair of black sneakers, 1 pair of white socks, 1 pair of jeans, 1 white bra, 1 shirt, 1 jacket, were sent with the patient to [facility] in a belonging bag.
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
## 2015-06-03 NOTE — ED Notes (Signed)
[place] transport called for transportation to [facility] of voluntary pt.

## 2015-06-03 NOTE — Tx Team (Signed)
Interdisciplinary Treatment Plan Update (Adult) Date: 06/03/2015    Time Reviewed: 9:30 AM  Progress in Treatment: Attending groups: Continuing to assess, patient new to milieu Participating in groups: Continuing to assess, patient new to milieu Taking medication as prescribed: Yes Tolerating medication: Yes Family/Significant other contact made: No, CSW assessing for appropriate contacts Patient understands diagnosis: Yes Discussing patient identified problems/goals with staff: Yes Medical problems stabilized or resolved: Yes Denies suicidal/homicidal ideation: Yes Issues/concerns per patient self-inventory: Yes Other:  New problem(s) identified: N/A  Discharge Plan or Barriers: Patient discharged due to disruptive, volatile, and aggressive behavior towards staff and other patients.   Reason for Continuation of Hospitalization:  Depression Anxiety Medication Stabilization   Comments: N/A  Estimated length of stay: Discharge anticipated for today 06/03/15    Patient is a 35 year old female admitted for polysubstance abuse and suicidal ideations. Patient will benefit from crisis stabilization, medication evaluation, group therapy and psycho education in addition to case management for discharge planning. At discharge, it is recommended that Pt remain compliant with established discharge plan and continued treatment.   Review of initial/current patient goals per problem list:  1. Goal(s): Patient will participate in aftercare plan   Met: Adequate for discharge per MD   Target date: 3-5 days post admission date   As evidenced by: Patient will participate within aftercare plan AEB aftercare provider and housing plan at discharge being identified.  2/23: Patient will return to previous living situation, declined referral for outpatient services.    2. Goal (s): Patient will exhibit decreased depressive symptoms and suicidal ideations.   Met: Adequate for discharge per  MD   Target date: 3-5 days post admission date   As evidenced by: Patient will utilize self rating of depression at 3 or below and demonstrate decreased signs of depression or be deemed stable for discharge by MD.  2/23: Patient discharged due to disruptive, volatile, and aggressive behavior towards staff and other patients.      4. Goal(s): Patient will demonstrate decreased signs of withdrawal due to substance abuse   Met: Adequate for discharge per MD   Target date: 3-5 days post admission date   As evidenced by: Patient will produce a CIWA/COWS score of 0, have stable vitals signs, and no symptoms of withdrawal  2/23: Patient with CIWA score of 2, experiencing anxiety.      Attendees: Patient:    Family:    Physician: Dr. Sabra Heck 06/03/2015 9:30 AM  Nursing: Mayra Neer, Grayland Ormond, RN 06/03/2015 9:30 AM  Clinical Social Worker: Tilden Fossa, LCSW 06/03/2015 9:30 AM  Other: Peri Maris, LCSWA; Rit[place], LCSW  06/03/2015 9:30 AM  Other:  06/03/2015 9:30 AM  Other: Lars Pinks, Case Manager 06/03/2015 9:30 AM  Other: Alcario Drought [facility] NP 06/03/2015 9:30 AM  Other:         Scribe for Treatment Team:  Tilden Fossa, [place]

## 2015-06-03 NOTE — [facility] Counselor (Signed)
No PSA completed as patient was discharged prior to 72 hours.  Samuella Bruin, LCSW Clinical Social Worker [facility] [phone]
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
## 2015-06-03 NOTE — Progress Notes (Addendum)
Discharge Note:  Patient was discharged with all her belongings.  Patient was yelling at staff, threatening staff, cursing staff and calling staff names.  MD discharged patient because other patients were being upset by this patient's behavior, cursing, yelling.  Suicide prevention information given to patient at discharge.  All discharge information was distributed to patient as required by [facility].

## 2015-06-03 NOTE — [facility] Suicide Risk Assessment (Signed)
[facility] Discharge Suicide Risk Assessment   Principal Problem: <principal problem not specified> Discharge Diagnoses:  Patient Active Problem List   Diagnosis Date Noted  . Polysubstance abuse [F19.10] 11/01/2013  . 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 Bipolar I disorder, most recent episode (or current) manic, unspecified [F31.10] 02/25/2013  . Substance abuse/dependence [IMO0002] 02/21/2013    Total Time spent with patient: 45 minutes  Musculoskeletal: Strength & Muscle Tone: within normal limits Gait & Station: normal Patient leans: normal  Psychiatric Specialty Exam: ROS  Blood pressure 106/70, pulse 88, temperature 98.6 F (37 C), temperature source Oral, resp. rate 18, height 5' (1.524 m), weight 80.287 kg (177 lb), last menstrual period 05/28/2015.Body mass index is 34.57 kg/(m^2).  General Appearance: Fairly Groomed  Patent attorney::  Fair  Speech:  Clear and Coherent409  Volume:  fluctuates  Mood:  Irritable  Affect:  Labile  Thought Process:  Coherent and Goal Directed  Orientation:  Full (Time, Place, and Person)  Thought Content:  events concerns  Suicidal Thoughts:  No  Homicidal Thoughts:  No  Memory:  Immediate;   Fair Recent;   Fair Remote;   Fair  Judgement:  Fair  Insight:  Shallow  Psychomotor Activity:  Normal  Concentration:  Fair  Recall:  [employer] of Knowledge:Fair  Language: Fair  Akathisia:  No  Handed:  Right  AIMS (if indicated):     Assets:  Housing  Sleep:  Number of Hours: 0  Cognition: WNL  ADL's:  Intact  In full contact with reality. There are no active S/S of withdrawal. There are no active SI plans or intent. Ms. Frangos asked to be D/C "now!." she was D/C to outpatient follow up. Mental Status Per Nursing Assessment::   On Admission:  Suicidal ideation indicated by patient, Self-harm thoughts  Demographic Factors:  Caucasian  Loss Factors: Loss of significant relationship  Historical Factors: none  identified  Risk Reduction Factors:   Living with another person, especially a relative  Continued Clinical Symptoms:  Alcohol/Substance Abuse/Dependencies  Cognitive Features That Contribute To Risk:  Closed-mindedness, Polarized thinking and Thought constriction (tunnel vision)    Suicide Risk:  Minimal: No identifiable suicidal ideation.  Patients presenting with no risk factors but with morbid ruminations; may be classified as minimal risk based on the severity of the depressive symptoms    Plan Of Care/Follow-up recommendations:  Activity:  as tolerated Diet:  regular  Aairah Negrette A, MD 06/03/2015, 1:01 PM

## 2015-06-03 NOTE — Progress Notes (Signed)
  [facility] Adult Case Management Discharge Plan :  Will you be returning to the same living situation after discharge:  Yes,  patient will return to previous living situation At discharge, do you have transportation home?: Yes,  patient reports access to transportation Do you have the ability to pay for your medications: Yes,  patient will be provided with prescriptions at discharge  Release of information consent forms completed and in the chart;  Patient's signature needed at discharge.  Patient to Follow up at: Follow-up Information    Follow up with Patient declined referral for outpatient services. .      Next level of care provider has access to [facility] Link:no  Safety Planning and Suicide Prevention discussed: Yes,  with patient  Have you used any form of tobacco in the last 30 days? (Cigarettes, Smokeless Tobacco, Cigars, and/or Pipes): Yes  Has patient been referred to the Quitline?: Patient refused referral  Patient has been referred for addiction treatment: Pt. refused referral  Uva Runkel, Belenda Cruise L 06/03/2015, 1:20 PM

## 2015-06-06 DIAGNOSIS — F330 Major depressive disorder, recurrent, mild: Secondary | ICD-10-CM | POA: Insufficient documentation

## 2015-06-06 DIAGNOSIS — F1920 Other psychoactive substance dependence, uncomplicated: Secondary | ICD-10-CM | POA: Insufficient documentation

## 2015-06-16 ENCOUNTER — Emergency Department (HOSPITAL_COMMUNITY)
Admission: EM | Admit: 2015-06-16 | Discharge: 2015-06-16 | Disposition: A | Discharge status: Home / Self Care | Payer: Self-pay | Attending: Emergency Medicine | Admitting: Emergency Medicine

## 2015-06-16 ENCOUNTER — Encounter (HOSPITAL_COMMUNITY): Payer: Self-pay | Admitting: *Deleted

## 2015-06-16 DIAGNOSIS — T434X5A Adverse effect of butyrophenone and thiothixene neuroleptics, initial encounter: Secondary | ICD-10-CM | POA: Insufficient documentation

## 2015-06-16 DIAGNOSIS — Z79899 Other long term (current) drug therapy: Secondary | ICD-10-CM | POA: Insufficient documentation

## 2015-06-16 DIAGNOSIS — F17210 Nicotine dependence, cigarettes, uncomplicated: Secondary | ICD-10-CM | POA: Insufficient documentation

## 2015-06-16 DIAGNOSIS — G2571 Drug induced akathisia: Secondary | ICD-10-CM | POA: Insufficient documentation

## 2015-06-16 DIAGNOSIS — F419 Anxiety disorder, unspecified: Secondary | ICD-10-CM | POA: Insufficient documentation

## 2015-06-16 DIAGNOSIS — F319 Bipolar disorder, unspecified: Secondary | ICD-10-CM | POA: Insufficient documentation

## 2015-06-16 MED ORDER — DIPHENHYDRAMINE HCL 50 MG/ML IJ SOLN
50.0000 mg | Freq: Once | INTRAMUSCULAR | Status: AC
  Administered 2015-06-16: 50 mg via INTRAMUSCULAR
  Filled 2015-06-16: qty 1

## 2015-06-16 NOTE — ED Notes (Signed)
Pt reports feeling restless, shakey, and dizziness since yesterday. Pt states that she started new medications including haldol, tegretol, and cogentin. Pt states that she has felt this way in the past when she took haldol. Pt states that this feels "like an anxiety thing".

## 2015-06-16 NOTE — ED Provider Notes (Signed)
CSN: [id number]     Arrival date & time 06/16/15  0732 History   First MD Initiated Contact with Patient 06/16/15 1014     Chief Complaint  Patient presents with  . Tremors     (Consider location/radiation/quality/duration/timing/severity/associated sxs/prior Treatment) HPI Patient presents to the emergency department with feeling of restlessness, shakiness and dizziness.  Chest the patient recently started Haldol and noticed this started after that.  The patient denies chest pain, shortness of breath, weakness, headache, blurred vision, back pain, neck pain, fever, dysuria, displaced, hematemesis, abdominal pain, nausea, vomiting, diarrhea, numbness, a disturbance, near syncope or syncope.  The patient states that she has no other medication changes.  She states she has stopped using benzos.  Patient states nothing seems to make her condition better or worse Past Medical History  Diagnosis Date  . Depression   . Bipolar 1 disorder (HCC)   . Anxiety   . Drug abuse   . ETOH abuse   . Polysubstance abuse    History reviewed. No pertinent past surgical history. No family history on file. Social History  Substance Use Topics  . Smoking status: Current Every Day Smoker -- 2.00 packs/day for 17 years    Types: Cigarettes  . Smokeless tobacco: Never Used  . Alcohol Use: 7.2 oz/week    12 Cans of beer per week     Comment: 12 pack of beer per day plus liquor or wine, whatever is available and fits in her pocket at work   OB History    No data available     Review of Systems All other systems negative except as documented in the HPI. All pertinent positives and negatives as reviewed in the HPI.  Allergies  Review of patient's allergies indicates no known allergies.  Home Medications   Prior to Admission medications   Medication Sig Start Date End Date Taking? Authorizing Provider  benztropine (COGENTIN) 0.5 MG tablet Take 0.5 mg by mouth 2 (two) times daily.   Yes Historical  Provider, MD  carbamazepine (TEGRETOL) 200 MG tablet Take 100 mg by mouth 3 (three) times daily.   Yes Historical Provider, MD  haloperidol (HALDOL) 2 MG tablet Take 2 mg by mouth 3 (three) times daily.   Yes Historical Provider, MD   BP 100/53 mmHg  Pulse 64  Temp(Src) 97.7 F (36.5 C) (Oral)  Resp 18  SpO2 97%  LMP 05/28/2015 (Approximate) Physical Exam  Constitutional: She is oriented to person, place, and time. She appears well-developed and well-nourished. No distress.  HENT:  Head: Normocephalic and atraumatic.  Mouth/Throat: Oropharynx is clear and moist.  Eyes: Pupils are equal, round, and reactive to light.  Neck: Normal range of motion. Neck supple.  Cardiovascular: Normal rate, regular rhythm and normal heart sounds.  Exam reveals no gallop and no friction rub.   No murmur heard. Pulmonary/Chest: Effort normal and breath sounds normal. No respiratory distress. She has no wheezes.  Abdominal: Soft. Bowel sounds are normal. She exhibits no distension. There is no tenderness.  Neurological: She is alert and oriented to person, place, and time. She exhibits normal muscle tone. Coordination normal.  Skin: Skin is warm and dry. No rash noted. No erythema.  Psychiatric: Her behavior is normal. Her mood appears anxious.  Nursing note and vitals reviewed.   ED Course  Procedures (including critical care time) Labs Review Labs Reviewed - No data to display  Imaging Review No results found. I have personally reviewed and evaluated these images and lab  results as part of my medical decision-making.  Patient is much better following the Benadryl will have her follow-up with her psychiatrist, will cut her Haldol dose in half.  Told to return here as needed.  Patient agrees the plan and all questions were answered feel that this is related to her Haldol   Charlestine Night, PA-C 06/16/15 1418  Alvira Monday, MD 06/16/15 2304

## 2015-06-16 NOTE — ED Notes (Signed)
NAD at this time. Pt is stable and going home.  

## 2015-06-16 NOTE — Discharge Instructions (Signed)
You need to hALF your Haldol dose until follow-up with your psychiatrist.  Follow up with him as soon as possible.  Return here as needed

## 2015-06-26 ENCOUNTER — Encounter (HOSPITAL_BASED_OUTPATIENT_CLINIC_OR_DEPARTMENT_OTHER): Payer: Self-pay | Admitting: Emergency Medicine

## 2015-06-26 ENCOUNTER — Emergency Department (HOSPITAL_BASED_OUTPATIENT_CLINIC_OR_DEPARTMENT_OTHER)
Admission: EM | Admit: 2015-06-26 | Discharge: 2015-06-28 | Disposition: A | Discharge status: Home / Self Care | Payer: Self-pay | Attending: Emergency Medicine | Admitting: Emergency Medicine

## 2015-06-26 DIAGNOSIS — F1994 Other psychoactive substance use, unspecified with psychoactive substance-induced mood disorder: Secondary | ICD-10-CM

## 2015-06-26 DIAGNOSIS — T50902A Poisoning by unspecified drugs, medicaments and biological substances, intentional self-harm, initial encounter: Secondary | ICD-10-CM

## 2015-06-26 DIAGNOSIS — F1120 Opioid dependence, uncomplicated: Secondary | ICD-10-CM | POA: Insufficient documentation

## 2015-06-26 HISTORY — DX: Sedative, hypnotic or anxiolytic abuse, uncomplicated: F13.10

## 2015-06-26 LAB — CBC WITH DIFFERENTIAL/PLATELET
BASOS PCT: 0 %
Basophils Absolute: 0 10*3/uL (ref 0.0–0.1)
EOS ABS: 0 10*3/uL (ref 0.0–0.7)
Eosinophils Relative: 1 %
HEMATOCRIT: 37.2 % (ref 36.0–46.0)
HEMOGLOBIN: 12.5 g/dL (ref 12.0–15.0)
LYMPHS ABS: 2.5 10*3/uL (ref 0.7–4.0)
Lymphocytes Relative: 38 %
MCH: 33.2 pg (ref 26.0–34.0)
MCHC: 33.6 g/dL (ref 30.0–36.0)
MCV: 98.9 fL (ref 78.0–100.0)
MONOS PCT: 6 %
Monocytes Absolute: 0.4 10*3/uL (ref 0.1–1.0)
NEUTROS ABS: 3.7 10*3/uL (ref 1.7–7.7)
NEUTROS PCT: 55 %
Platelets: 357 10*3/uL (ref 150–400)
RBC: 3.76 MIL/uL — AB (ref 3.87–5.11)
RDW: 12.8 % (ref 11.5–15.5)
WBC: 6.6 10*3/uL (ref 4.0–10.5)

## 2015-06-26 LAB — PREGNANCY, URINE: PREG TEST UR: NEGATIVE

## 2015-06-26 MED ORDER — SODIUM CHLORIDE 0.9 % IV BOLUS (SEPSIS)
1000.0000 mL | Freq: Once | INTRAVENOUS | Status: AC
  Administered 2015-06-26: 1000 mL via INTRAVENOUS

## 2015-06-26 NOTE — ED Notes (Signed)
Pt in c/o drug use and suicidal ideation. States recently using Xanax, heroin, and alcohol. States has been suicidal most of her life.

## 2015-06-26 NOTE — ED Notes (Signed)
[employer] officer at bedside to wand pt. Pt placed in wine scrubs and belongings placed in bag.

## 2015-06-27 ENCOUNTER — Encounter (HOSPITAL_COMMUNITY): Payer: Self-pay | Admitting: *Deleted

## 2015-06-27 LAB — COMPREHENSIVE METABOLIC PANEL
ALBUMIN: 4.2 g/dL (ref 3.5–5.0)
ALT: 18 U/L (ref 14–54)
AST: 15 U/L (ref 15–41)
Alkaline Phosphatase: 57 U/L (ref 38–126)
Anion gap: 9 (ref 5–15)
BUN: 6 mg/dL (ref 6–20)
CHLORIDE: 106 mmol/L (ref 101–111)
CO2: 24 mmol/L (ref 22–32)
CREATININE: 0.49 mg/dL (ref 0.44–1.00)
Calcium: 8.8 mg/dL — ABNORMAL LOW (ref 8.9–10.3)
GFR calc Af Amer: >60 mL/min (ref >60)
GFR calc non Af Amer: >60 mL/min (ref >60)
GLUCOSE: 97 mg/dL (ref 65–99)
POTASSIUM: 3.8 mmol/L (ref 3.5–5.1)
Sodium: 139 mmol/L (ref 135–145)
Total Bilirubin: 0.3 mg/dL (ref 0.3–1.2)
Total Protein: 7.2 g/dL (ref 6.5–8.1)

## 2015-06-27 LAB — RAPID URINE DRUG SCREEN, HOSP PERFORMED
AMPHETAMINES: NOT DETECTED
BARBITURATES: NOT DETECTED
Benzodiazepines: POSITIVE — AB
Cocaine: NOT DETECTED
Opiates: NOT DETECTED
TETRAHYDROCANNABINOL: POSITIVE — AB

## 2015-06-27 LAB — ACETAMINOPHEN LEVEL

## 2015-06-27 LAB — ETHANOL: ALCOHOL ETHYL (B): 39 mg/dL — AB (ref <5)

## 2015-06-27 LAB — SALICYLATE LEVEL

## 2015-06-27 MED ORDER — LORAZEPAM 1 MG PO TABS: 1.0000 mg | ORAL_TABLET | Freq: Every day | ORAL | Status: DC

## 2015-06-27 MED ORDER — LORAZEPAM 1 MG PO TABS: 1.0000 mg | ORAL_TABLET | Freq: Two times a day (BID) | ORAL | Status: DC

## 2015-06-27 MED ORDER — HYDROXYZINE HCL 25 MG PO TABS
25.0000 mg | ORAL_TABLET | Freq: Four times a day (QID) | ORAL | Status: DC | PRN
  Administered 2015-06-27: 25 mg via ORAL
  Filled 2015-06-27: qty 1

## 2015-06-27 MED ORDER — LORAZEPAM 1 MG PO TABS: 1.0000 mg | ORAL_TABLET | Freq: Three times a day (TID) | ORAL | Status: DC

## 2015-06-27 MED ORDER — LORAZEPAM 1 MG PO TABS
2.0000 mg | ORAL_TABLET | Freq: Once | ORAL | Status: AC
  Administered 2015-06-27: 2 mg via ORAL
  Filled 2015-06-27: qty 2

## 2015-06-27 MED ORDER — ADULT MULTIVITAMIN W/MINERALS CH
1.0000 | ORAL_TABLET | Freq: Every day | ORAL | Status: DC
  Administered 2015-06-27 – 2015-06-28 (×2): 1 via ORAL
  Filled 2015-06-27 (×2): qty 1

## 2015-06-27 MED ORDER — ALUM & MAG HYDROXIDE-SIMETH 200-200-20 MG/5ML PO SUSP
30.0000 mL | ORAL | Status: DC | PRN
  Administered 2015-06-27 (×2): 30 mL via ORAL
  Filled 2015-06-27 (×2): qty 30

## 2015-06-27 MED ORDER — NICOTINE 21 MG/24HR TD PT24
21.0000 mg | MEDICATED_PATCH | Freq: Every day | TRANSDERMAL | Status: DC
  Administered 2015-06-27 – 2015-06-28 (×3): 21 mg via TRANSDERMAL
  Filled 2015-06-27 (×3): qty 1

## 2015-06-27 MED ORDER — IBUPROFEN 400 MG PO TABS
600.0000 mg | ORAL_TABLET | Freq: Three times a day (TID) | ORAL | Status: DC | PRN
  Filled 2015-06-27: qty 1

## 2015-06-27 MED ORDER — LORAZEPAM 1 MG PO TABS
1.0000 mg | ORAL_TABLET | Freq: Four times a day (QID) | ORAL | Status: AC
  Administered 2015-06-27 – 2015-06-28 (×4): 1 mg via ORAL
  Filled 2015-06-27 (×3): qty 1

## 2015-06-27 MED ORDER — LOPERAMIDE HCL 2 MG PO CAPS: 2.0000 mg | ORAL_CAPSULE | ORAL | Status: DC | PRN

## 2015-06-27 MED ORDER — ACETAMINOPHEN 325 MG PO TABS
650.0000 mg | ORAL_TABLET | ORAL | Status: DC | PRN
  Administered 2015-06-27: 650 mg via ORAL
  Filled 2015-06-27 (×2): qty 2

## 2015-06-27 MED ORDER — PANTOPRAZOLE SODIUM 40 MG PO TBEC
40.0000 mg | DELAYED_RELEASE_TABLET | Freq: Every day | ORAL | Status: DC
  Administered 2015-06-27 – 2015-06-28 (×2): 40 mg via ORAL
  Filled 2015-06-27 (×2): qty 1

## 2015-06-27 MED ORDER — ONDANSETRON HCL 4 MG PO TABS
4.0000 mg | ORAL_TABLET | Freq: Three times a day (TID) | ORAL | Status: DC | PRN
  Administered 2015-06-27: 4 mg via ORAL
  Filled 2015-06-27 (×2): qty 1

## 2015-06-27 MED ORDER — VITAMIN B-1 100 MG PO TABS
100.0000 mg | ORAL_TABLET | Freq: Every day | ORAL | Status: DC
  Administered 2015-06-28: 100 mg via ORAL
  Filled 2015-06-27: qty 1

## 2015-06-27 MED ORDER — LORAZEPAM 1 MG PO TABS
1.0000 mg | ORAL_TABLET | Freq: Four times a day (QID) | ORAL | Status: DC | PRN
  Filled 2015-06-27: qty 1

## 2015-06-27 MED ORDER — ZIPRASIDONE MESYLATE 20 MG IM SOLR
10.0000 mg | Freq: Once | INTRAMUSCULAR | Status: AC
  Administered 2015-06-27: 10 mg via INTRAMUSCULAR
  Filled 2015-06-27: qty 20

## 2015-06-27 NOTE — ED Notes (Signed)
Paperwork finalized, pt ready for transfer with PD [facility].

## 2015-06-27 NOTE — ED Notes (Signed)
Dr. Nicanor AlconPalumbo present at d/c.

## 2015-06-27 NOTE — ED Notes (Addendum)
Security remains present. No changes.

## 2015-06-27 NOTE — ED Notes (Signed)
Pt refused tylenol, advil zofran and geodon. States, I don't like the way it makes me feel, denies allergy. Involuntary status explained d/t hx, presentation, statements and behavior. Pt remains flight risk. Optional and mandatory meds explained. Explained that Geodon was going to be given, pt reluctantly and cooperatively received med.

## 2015-06-27 NOTE — ED Notes (Signed)
Pt ambulatory to desk - has on hospital gown and maroon-colored paper scrubs - d/t c/o sweating. Advised she may wear the gown while sleeping. Pt ambulatory to nurses' desk asking for "medication for pain". Advised pt she may have Tylenol rather than Ibuprofen d/t her c/o indigestion. Voiced understanding and ambulated back to room w/sitter.

## 2015-06-27 NOTE — ED Notes (Signed)
Staffing Office aware pt now in C20.

## 2015-06-27 NOTE — ED Notes (Signed)
Pt given new paper scrub shirt as requested d/t sweating.

## 2015-06-27 NOTE — ED Notes (Signed)
Pt declined snack offered. Pt aware of order received for Ativan detox protocol. Pt voiced appreciation. Pt also aware her mother called and advised is planning to visit at 1730. Mother's phone number - [phone]

## 2015-06-27 NOTE — ED Notes (Signed)
Pt's friend has arrived to visit pt.

## 2015-06-27 NOTE — ED Notes (Signed)
Pt ambulatory to nurses' desk asking when she can speak w/EDP d/t is upset re: IVC. Pt also asking for tampon from her belongings - given. Pt ambulatory to desk again - asked pt to please return to her room as RN is receiving report. Pt cooperated. Pt then ambulatory to desk once more - asking if she may call her friend to inquire if she is going to visit her this am. Advised pt this would be her 2nd and last phone call for the day. Voiced understanding after much encouragement and returned to her room w/o making the call.

## 2015-06-27 NOTE — ED Notes (Signed)
Pt yelling, door shut, PD x2 present, doors window blinds open, boundaries set. Rationale explained. PD presence explained.

## 2015-06-27 NOTE — ED Notes (Addendum)
Pt calmer, no longer yelling, asking questions, questions answered, pending final paperwork. PD and security present, door open.

## 2015-06-27 NOTE — ED Notes (Signed)
TTS initiated, in progress  

## 2015-06-27 NOTE — BH Assessment (Signed)
Writer received phone call from pt RN (Jasmine) Tree surgeoninforming writer that pt's friend wanted to speak with TTS to provide collateral information. Writer provided RN with TTS phone number to give to pt/friend.   Writer received a  phone call from caller identifying herself as Dimitra Chioskas  and pt's girlfriend. (provided call back # [phone]). Caller informed this Clinical research associatewriter that she was with pt when pt ingested substances and that it was not in attempt to commit suicide. Caller inquired as to why pt was IVC'd and asked how to change IVC status. Writer informed caller that she was unable to release pt information and that she would document collateral information received. Writer informed caller that she and pt may inform whomever pt's attending provider is of their concerns.   Writer then received phone call from pt RN (Jasmine) Tree surgeoninforming writer that it was pt who contacted TTS portraying herself to be a significant other. RN stated that she retrieve TTS phone number from pt.

## 2015-06-27 NOTE — ED Notes (Signed)
Pt's friend at bedside. Pt asking for Mylanta.

## 2015-06-27 NOTE — ED Notes (Signed)
CN updated pt with rationale, no changes, pt remains calm and questioning. PD x2 and security present.

## 2015-06-27 NOTE — BH Assessment (Signed)
Writer faxed referral to the following hospitals: [facility], [facility], [facility], [facility], [facility], [facility],  [facility], [facility],

## 2015-06-27 NOTE — ED Notes (Signed)
[facility] [place] [facility], pt updated with plan with rationale, boundaries set re: plan for inpt treatment. security rounding.

## 2015-06-27 NOTE — ED Notes (Signed)
TTS complete, pt mildly restless, increased verbal. Security and [place] [employer] rounding[facility]

## 2015-06-27 NOTE — ED Notes (Addendum)
RN spoke w/pt for an extended amount of time d/t pt stating she is upset over being IVC'd. Pt voiced understanding of [facility] C policies. C/o she wants to make another phone however she has used both calls even though RN encouraged pt earlier to save last call prior to her calling her friend this am. Pt states she is not SI - states taking Xanax 2mg  (#30 tabs) over past 3 days was not an SI attempt - states was d/t she had relapsed. States she also "drank a little" ETOH. States she had gone to an [employer] meeting prior. States she wanted inpt tx initially but now that she is IVC'd, she is upset and mad and wants to be d/c'd to home. States she will follow up w/Family Crisis tomorrow as she does not have a therapist nor psych dr. Andrey CotaStates she will have her friend to come to ED and "straighten this out". States she [facility]. Also states "I refuse to go outside of [place]". Pt became very upset when RN advised her, per TTS documentation, that she had told them she was SI w/no plan. Pt states that she has "suicidal ideations" and denies plan currently. Advised pt, for her safety, she will remain IVC'd and inpt tx is being has been recommended. Dr Corlis LeakMacKuen aware of pt's behavior - pushing table against wall in room, cursing, yelling, tore paper w/Pod C policies listed. Order received for Ativan 2mg  po - given.

## 2015-06-27 NOTE — ED Notes (Signed)
Pt given Mylanta and Protonix as requested d/t c/o "indigestion". States she experiences it everyday if she does not take Prilosec.

## 2015-06-27 NOTE — ED Notes (Signed)
[employer] served new set of IVC papers - [facility], copy sent to medical records and original Affidavit and 1st Exam placed in folder for Magistrate.

## 2015-06-27 NOTE — ED Notes (Addendum)
Pt on phone trying to speak with an adult unit psych ward, pt asking about bed availabilityl. States, I can come and go because I am be voluntary. Plan, process, voluntary and involuntary, boundaries, rights and meds explained with rationale. Phone collected and placed with belongings. Pt continuously updated, verbaliizes understanding, then asks similar questions. Pt with circular arguments, poor insight, poor rationale. increasingly agitated and yelling, PD security and CN present.

## 2015-06-27 NOTE — ED Notes (Signed)
Room cleared of loose objects and un-needed cords and lines for safety. Pt in paper scrubs. Resting comfortably in stretcher, NAD, calm, cooperative, polite.

## 2015-06-27 NOTE — ED Notes (Signed)
No changes, [facility], TTS continues.

## 2015-06-27 NOTE — ED Notes (Signed)
Pt continuing to ask repetitive questions - in a much calmer tone than previously.

## 2015-06-27 NOTE — ED Notes (Addendum)
Spoke with [name] at [employer]: based on timeframe, story, labs and vital signs they would consider her medically cleared. They suggest a tegretol level based on pt h/o prescribed tegretol. Tegretol level unavailable at this time. Pt denies taking tegretol. States, last dose 1.5 weeks ago.

## 2015-06-27 NOTE — ED Notes (Signed)
New set of IVC papers given to EDP to complete d/t [employer] completed sections "C" and "D" on paperwork.

## 2015-06-27 NOTE — ED Notes (Addendum)
[facility] present. IVC re-explained to pt with rationale. Pt standing in doorway with arms crossed, arguing the plan.

## 2015-06-27 NOTE — ED Notes (Signed)
[facility], pt attempting to negotiate.

## 2015-06-27 NOTE — ED Notes (Signed)
Staffing Office aware of need for sitter. 

## 2015-06-27 NOTE — ED Notes (Signed)
Pt aware friend brought bag and has been placed w/her other belongings.

## 2015-06-27 NOTE — ED Notes (Signed)
Pt states Mylanta not helping w/indigestion. Zofran and Vistaril given as requested. Pt given shower supplies.

## 2015-06-27 NOTE — ED Notes (Signed)
Calm, resting, no changes.

## 2015-06-27 NOTE — ED Notes (Signed)
Dr Corlis LeakMacKuen in w/pt.

## 2015-06-27 NOTE — ED Notes (Signed)
C/o body aches, HA, agitation, nausea.

## 2015-06-27 NOTE — ED Notes (Signed)
No changes.  [facility]  [facility]

## 2015-06-27 NOTE — ED Notes (Signed)
IVF infusing, given coffee per request, declines food, last ate Friday, [place] [employer] in room at this time.

## 2015-06-27 NOTE — ED Notes (Signed)
IVC papers completed by Dr Corlis LeakMacKuen. Magistrate verified receipt and is waiting for them to be picked up.

## 2015-06-27 NOTE — ED Notes (Signed)
No changes, [facility] Pt alert, mildly aggitated.

## 2015-06-27 NOTE — ED Notes (Signed)
Staffing Office aware moved to C20.

## 2015-06-27 NOTE — ED Provider Notes (Signed)
Medical screening exam:  Patient transferred from [facility] to be held for psychiatric evaluation. Patient is under involuntary commitment for suicidal ideation.  Patient evaluated at arrival to the ER. She is alert, oriented. She reports that "it was all a mistake". She had thoughts of harming herself but did not have a plan. She does not think she should be under involuntary commitment.   Patient has been evaluated by psychiatry and inpatient psychiatric treatment has been recommended. Placement is ongoing.  Gilda Creasehristopher J Zienna Ahlin, MD 06/27/15 [phone]

## 2015-06-27 NOTE — ED Notes (Signed)
Assigned to sit with patient . She is acting out, yelling and causing a disruption. Very argumentative.

## 2015-06-27 NOTE — ED Notes (Signed)
Pt's friend called and advised she has brought clothing for pt as pt requested. States she is aware she may not have it in ED but will need it for when she transported to next facility.

## 2015-06-27 NOTE — ED Notes (Signed)
Pt signed consent form for info to be released to her friend, Jolyn NapDimitea Tsiolkas -- [phone], and her mother, Jewel BaizeJudy Madkins - [phone]. [facility].

## 2015-06-27 NOTE — ED Notes (Signed)
No changes, up to b/r EMT.

## 2015-06-27 NOTE — ED Notes (Addendum)
Increasing aggitation, argumentative about meds and plan.  Admits to increasing aggitation. EDP aware. Verbal threats to "about to go off". PD, security and PD at [facility].
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
## 2015-06-28 DIAGNOSIS — F1994 Other psychoactive substance use, unspecified with psychoactive substance-induced mood disorder: Secondary | ICD-10-CM

## 2015-06-28 NOTE — ED Notes (Signed)
Pt at nurses station asking for sleep aide and asking for another dose of ativan. Instructed pt that neither would be provided at this time. Pt requested juice, informed that we could provide water. There are designated snack hours. Pt returned to room. Ambulatory. NAD

## 2015-06-28 NOTE — Consult Note (Signed)
Telepsych Consultation   Reason for Consult: Substance abuse  Referring Physician:  EDP Patient Identification: [name] MRN:  [id number] Principal Diagnosis: Substance induced mood disorder ([place]) Diagnosis:   Patient Active Problem List   Diagnosis Date Noted  . Substance induced mood disorder ([place]) [F19.94] 06/28/2015  . Polysubstance dependence including opioid type drug, episodic abuse ([place]) [F11.20, F19.20] 06/03/2015  . Polysubstance abuse [F19.10] 11/01/2013  . 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 Bipolar I disorder, most recent episode (or current) manic, unspecified [F31.10] 02/25/2013  . Substance abuse/dependence [IMO0002] 02/21/2013    Total Time spent with patient: 45 minutes  Subjective:   [name] is a 35 y.o. female patient admitted with polysubstance abuse. Patient states "I feel better today. I guess I was intoxicated when I came here. I have had some losses recently and started using more than I should be. I feel depressed but I do not want to hurt myself. I have been trying to establish care with [facility] [place]. I also have a sponsor I can call. I will go to [place] meetings today too. I know the substances do not help my mood. I need to be on medicines to help me."   HPI:    [name] is a 35 year old female with a history of Bipolar 1 Disorder who presented to the [facility] wanting help to get off multiple substances. She was last inpatient at [facility] in February 2017 but was discharged due to an altercation on the unit. She is also well known to Probation officer from past admissions at [facility]. Patient reports having a depressed mood but denies suicidal ideation. She admits that her use of alcohol, marijuana, and xanax could be considered dangerous. Patient has been buying xanax off the street recently. She reports wanting to get into an IOP program at [place] and that she has already set up a sponsor.  Patient expresses a strong desire to abstain from using substances after discharge stating "I hear what you are saying. Psych medications will not work if I keep doing all that." Patient also reports being inpatient at [facility] after leaving [facility] last month. The patient was cooperative during assessment and appeared highly motivated to follow up on an outpatient basis. She did not appear to be a risk to herself or others. Patient denies any psychosis and did not appear to be experiencing withdrawal symptoms. Her case was discussed with Dr. Sabra Heck who agrees with the disposition to discharge home.   Past Psychiatric History: Bipolar 1  Risk to Self: Suicidal Ideation: Yes-Currently Present Suicidal Intent: No-Not Currently/Within Last 6 Months Is patient at risk for suicide?: Yes Suicidal Plan?: No-Not Currently/Within Last 6 Months Access to Means: Yes Specify Access to Suicidal Means: Access to prescription, OTC & illicit drugs What has been your use of drugs/alcohol within the last 12 months?: Heroin, THC, Alcohol, Xanax How many times?: 6 Other Self Harm Risks: SA, multiple suicide attempts Triggers for Past Attempts: Unpredictable, Unknown Intentional Self Injurious Behavior: Cutting, Burning Comment - Self Injurious Behavior: last occuring "years ago" Risk to Others: Homicidal Ideation: No Thoughts of Harm to Others: No Current Homicidal Intent: No Current Homicidal Plan: No Access to Homicidal Means: No History of harm to others?: No Assessment of Violence: None Noted Does patient have access to weapons?: Yes (Comment) (metal bar, knife) Criminal Charges Pending?: No Does patient have a court date: No Prior Inpatient Therapy: Prior Inpatient Therapy: Yes  Prior Therapy Dates: 7 dates from 2008-2015 (Per Chart) Prior Therapy Facilty/Provider(s): [facility] Reason for Treatment: depression; SI; SA Prior Outpatient Therapy: Prior Outpatient Therapy: Yes Prior Therapy Dates: "Its  been a while" Prior Therapy Facilty/Provider(s): [facility] [place] Reason for Treatment: MDD, PTSD, Biplolar Does patient have an ACCT team?: No Does patient have Intensive In-House Services?  : No Does patient have [facility] services? : No Does patient have P4CC services?: No  Past Medical History:  Past Medical History  Diagnosis Date  . Depression   . Bipolar 1 disorder ([place])   . Anxiety   . Drug abuse   . ETOH abuse   . Polysubstance abuse   . Benzodiazepine abuse    History reviewed. No pertinent past surgical history. Family History: History reviewed. No pertinent family history. Family Psychiatric  History: Mother with depression  Social History:  History  Alcohol Use  . 7.2 oz/week  . 12 Cans of beer per week    Comment: 12 pack of beer per day plus liquor or wine, whatever is available and fits in her pocket at work     History  Drug Use  . 7.00 per week  . Special: Heroin, Benzodiazepines, Marijuana, IV    Comment: Any type of benzo she can get first thing in the morning and continue all day, THC 5-7 times per day    Social History   Social History  . Marital Status: Single    Spouse Name: N/A  . Number of Children: N/A  . Years of Education: N/A   Social History Main Topics  . Smoking status: Current Every Day Smoker -- 2.00 packs/day for 17 years    Types: Cigarettes  . Smokeless tobacco: Never Used  . Alcohol Use: 7.2 oz/week    12 Cans of beer per week     Comment: 12 pack of beer per day plus liquor or wine, whatever is available and fits in her pocket at work  . Drug Use: 7.00 per week    Special: Heroin, Benzodiazepines, Marijuana, IV     Comment: Any type of benzo she can get first thing in the morning and continue all day, THC 5-7 times per day  . Sexual Activity: No   Other Topics Concern  . None   Social History Narrative   Additional Social History:    Allergies:  No Known Allergies  Labs:  Results for orders placed or  performed during the hospital encounter of 06/26/15 (from the past 48 hour(s))  Pregnancy, urine     Status: None   Collection Time: 06/26/15 11:30 PM  Result Value Ref Range   Preg Test, Ur NEGATIVE NEGATIVE    Comment:        THE SENSITIVITY OF THIS METHODOLOGY IS >20 mIU/mL.   Urine rapid drug screen (hosp performed)not at [facility]     Status: Abnormal   Collection Time: 06/26/15 11:30 PM  Result Value Ref Range   Opiates NONE DETECTED NONE DETECTED   Cocaine NONE DETECTED NONE DETECTED   Benzodiazepines POSITIVE (A) NONE DETECTED   Amphetamines NONE DETECTED NONE DETECTED   Tetrahydrocannabinol POSITIVE (A) NONE DETECTED   Barbiturates NONE DETECTED NONE DETECTED    Comment:        DRUG SCREEN FOR MEDICAL PURPOSES ONLY.  IF CONFIRMATION IS NEEDED FOR ANY PURPOSE, NOTIFY LAB WITHIN 5 DAYS.        LOWEST DETECTABLE LIMITS FOR URINE DRUG SCREEN Drug Class  Cutoff (ng/mL) Amphetamine      1000 Barbiturate      200 Benzodiazepine   703 Tricyclics       500 Opiates          300 Cocaine          300 THC              50   CBC with Differential/Platelet     Status: Abnormal   Collection Time: 06/26/15 11:42 PM  Result Value Ref Range   WBC 6.6 4.0 - 10.5 K/uL   RBC 3.76 (L) 3.87 - 5.11 MIL/uL   Hemoglobin 12.5 12.0 - 15.0 g/dL   HCT 37.2 36.0 - 46.0 %   MCV 98.9 78.0 - 100.0 fL   MCH 33.2 26.0 - 34.0 pg   MCHC 33.6 30.0 - 36.0 g/dL   RDW 12.8 11.5 - 15.5 %   Platelets 357 150 - 400 K/uL   Neutrophils Relative % 55 %   Neutro Abs 3.7 1.7 - 7.7 K/uL   Lymphocytes Relative 38 %   Lymphs Abs 2.5 0.7 - 4.0 K/uL   Monocytes Relative 6 %   Monocytes Absolute 0.4 0.1 - 1.0 K/uL   Eosinophils Relative 1 %   Eosinophils Absolute 0.0 0.0 - 0.7 K/uL   Basophils Relative 0 %   Basophils Absolute 0.0 0.0 - 0.1 K/uL  Acetaminophen level     Status: Abnormal   Collection Time: 06/26/15 11:42 PM  Result Value Ref Range   Acetaminophen (Tylenol), Serum <10 (L) 10 - 30 ug/mL     Comment:        THERAPEUTIC CONCENTRATIONS VARY SIGNIFICANTLY. A RANGE OF 10-30 ug/mL MAY BE AN EFFECTIVE CONCENTRATION FOR MANY PATIENTS. HOWEVER, SOME ARE BEST TREATED AT CONCENTRATIONS OUTSIDE THIS RANGE. ACETAMINOPHEN CONCENTRATIONS >150 ug/mL AT 4 HOURS AFTER INGESTION AND >50 ug/mL AT 12 HOURS AFTER INGESTION ARE OFTEN ASSOCIATED WITH TOXIC REACTIONS.   Comprehensive metabolic panel     Status: Abnormal   Collection Time: 06/26/15 11:42 PM  Result Value Ref Range   Sodium 139 135 - 145 mmol/L   Potassium 3.8 3.5 - 5.1 mmol/L   Chloride 106 101 - 111 mmol/L   CO2 24 22 - 32 mmol/L   Glucose, Bld 97 65 - 99 mg/dL   BUN 6 6 - 20 mg/dL   Creatinine, Ser 0.49 0.44 - 1.00 mg/dL   Calcium 8.8 (L) 8.9 - 10.3 mg/dL   Total Protein 7.2 6.5 - 8.1 g/dL   Albumin 4.2 3.5 - 5.0 g/dL   AST 15 15 - 41 U/L   ALT 18 14 - 54 U/L   Alkaline Phosphatase 57 38 - 126 U/L   Total Bilirubin 0.3 0.3 - 1.2 mg/dL   GFR calc non Af Amer >60 >60 mL/min   GFR calc Af Amer >60 >60 mL/min    Comment: (NOTE) The eGFR has been calculated using the CKD EPI equation. This calculation has not been validated in all clinical situations. eGFR's persistently <60 mL/min signify possible Chronic Kidney Disease.    Anion gap 9 5 - 15  Ethanol     Status: Abnormal   Collection Time: 06/26/15 11:42 PM  Result Value Ref Range   Alcohol, Ethyl (B) 39 (H) <5 mg/dL    Comment:        LOWEST DETECTABLE LIMIT FOR SERUM ALCOHOL IS 5 mg/dL FOR MEDICAL PURPOSES ONLY   Salicylate level     Status: None   Collection  Time: 06/26/15 11:42 PM  Result Value Ref Range   Salicylate Lvl <6.2 2.8 - 30.0 mg/dL    Current Facility-Administered Medications  Medication Dose Route Frequency Provider Last Rate Last Dose  . acetaminophen (TYLENOL) tablet 650 mg  650 mg Oral Q4H PRN April Palumbo, MD   650 mg at 06/27/15 2224  . alum & mag hydroxide-simeth (MAALOX/MYLANTA) 200-200-20 MG/5ML suspension 30 mL  30 mL Oral PRN  April Palumbo, MD   30 mL at 06/27/15 1800  . hydrOXYzine (ATARAX/VISTARIL) tablet 25 mg  25 mg Oral Q6H PRN Courteney Lyn Mackuen, MD   25 mg at 06/27/15 2110  . ibuprofen (ADVIL,MOTRIN) tablet 600 mg  600 mg Oral Q8H PRN April Palumbo, MD      . loperamide (IMODIUM) capsule 2-4 mg  2-4 mg Oral PRN Courteney Lyn Mackuen, MD      . LORazepam (ATIVAN) tablet 1 mg  1 mg Oral Q6H PRN Courteney Lyn Mackuen, MD      . LORazepam (ATIVAN) tablet 1 mg  1 mg Oral TID Courteney Lyn Mackuen, MD       Followed by  . [START ON 06/29/2015] LORazepam (ATIVAN) tablet 1 mg  1 mg Oral BID Courteney Lyn Mackuen, MD       Followed by  . [START ON 07/01/2015] LORazepam (ATIVAN) tablet 1 mg  1 mg Oral Daily Courteney Lyn Mackuen, MD      . multivitamin with minerals tablet 1 tablet  1 tablet Oral Daily Courteney Lyn Mackuen, MD   1 tablet at 06/28/15 0921  . nicotine (NICODERM CQ - dosed in mg/24 hours) patch 21 mg  21 mg Transdermal Daily April Palumbo, MD   21 mg at 06/28/15 6948  . ondansetron (ZOFRAN) tablet 4 mg  4 mg Oral Q8H PRN April Palumbo, MD   4 mg at 06/27/15 2110  . pantoprazole (PROTONIX) EC tablet 40 mg  40 mg Oral Daily Courteney Lyn Mackuen, MD   40 mg at 06/28/15 0921  . thiamine (VITAMIN B-1) tablet 100 mg  100 mg Oral Daily Courteney Lyn Mackuen, MD   100 mg at 06/28/15 5462   Current Outpatient Prescriptions  Medication Sig Dispense Refill  . benztropine (COGENTIN) 0.5 MG tablet Take 0.5 mg by mouth 2 (two) times daily.    . carbamazepine (TEGRETOL) 200 MG tablet Take 100 mg by mouth 3 (three) times daily.    Marland Kitchen omeprazole (PRILOSEC) 20 MG capsule Take 20 mg by mouth daily.    Marland Kitchen zolpidem (AMBIEN) 5 MG tablet Take 5 mg by mouth at bedtime as needed for sleep.    . haloperidol (HALDOL) 2 MG tablet Take 2 mg by mouth 3 (three) times daily.      Musculoskeletal: Unable to assess via camera Psychiatric Specialty Exam: Review of Systems  Constitutional: Negative.   HENT: Negative.   Eyes:  Negative.   Respiratory: Negative.   Cardiovascular: Negative.   Gastrointestinal: Negative.   Genitourinary: Negative.   Musculoskeletal: Negative.   Skin: Negative.   Neurological: Negative.   Endo/Heme/Allergies: Negative.   Psychiatric/Behavioral: Positive for depression (Stable ) and substance abuse. Negative for suicidal ideas, hallucinations and memory loss. The patient is not nervous/anxious and does not have insomnia.     Blood pressure 110/80, pulse 88, temperature 97.7 F (36.5 C), temperature source Oral, resp. rate 18, height '5\' 2"'$  (1.575 m), weight 81.647 kg (180 lb), last menstrual period 05/28/2015, SpO2 99 %.Body mass index is 32.91 kg/(m^2).  General Appearance: Casual  Eye Contact::  Good  Speech:  Clear and Coherent  Volume:  Normal  Mood:  Depressed  Affect:  Appropriate  Thought Process:  Goal Directed and Intact  Orientation:  Full (Time, Place, and Person)  Thought Content:  Symptoms, worries   Suicidal Thoughts:  No  Homicidal Thoughts:  No  Memory:  Immediate;   Good Recent;   Good Remote;   Good  Judgement:  Impaired  Insight:  Shallow  Psychomotor Activity:  Normal  Concentration:  Good  Recall:  Good  Fund of Knowledge:Good  Language: Good  Akathisia:  No  Handed:  Right  AIMS (if indicated):     Assets:  Communication Skills Desire for Improvement Housing Intimacy Leisure Time Physical Health Resilience Social Support  ADL's:  Intact  Cognition: WNL  Sleep:      Treatment Plan Summary: Discussed at length with patient about need to avoid illicit substances and to follow up today with [place] in addition to contacting her sponsor. Patient reports that she also plans to attend AA meetings.   Disposition: No evidence of imminent risk to self or others at present.   Patient does not meet criteria for psychiatric inpatient admission. Supportive therapy provided about ongoing stressors. Discussed crisis plan,  support from social network, calling 911, coming to the Emergency Department, and calling Suicide Hotline.  Elmarie Shiley, NP 06/28/2015 12:36 PM       I agree with assessment and plan Geralyn Flash A. Sabra Heck, M.D.

## 2015-06-28 NOTE — ED Notes (Signed)
Breakfast tray at bedside 

## 2015-06-28 NOTE — ED Notes (Signed)
Dagoberto ReefClerk of Court contacted regarding receipt of IVC Rescind form, Advised they will call this RN back once they have received paperwork.

## 2015-06-28 NOTE — ED Notes (Signed)
[facility] stating that after assessment of patient, DR. Dub MikesLugo feels that patient can be discharged and setup for outpatient treatment. Vernona RiegerLaura advised she would notify ED Physician regarding patient's disposition

## 2015-06-28 NOTE — ED Notes (Addendum)
Dr[facility] Criss [facility]vineGoldston [facility], Rescind of IVC paperwork form signed by Dr. Criss AlvineGoldston and Faxed to clerk of court's office.

## 2015-06-28 NOTE — ED Provider Notes (Addendum)
Patient is no longer suicidal. Discussed with Vernona RiegerLaura, NP, who has seen patient and feels she is stable for d/c.  Pricilla LovelessScott Sherrel Shafer, MD 06/28/15 1228  Given she does not appear psychotic, no SI, etc I have rescinded her IVC  Pricilla LovelessScott Rosie Golson, MD 06/28/15 1237

## 2015-06-28 NOTE — ED Notes (Signed)
Pt requesting PRN dose of Ativan. Pts CWA score does not allow pt to have an extra dose. This RN went to inform the pt, pt was sleeping.

## 2015-06-28 NOTE — ED Notes (Signed)
TTS machine placed at bedside.   

## 2015-07-01 ENCOUNTER — Encounter (HOSPITAL_COMMUNITY): Payer: Self-pay | Admitting: *Deleted

## 2015-07-01 ENCOUNTER — Emergency Department (HOSPITAL_COMMUNITY)
Admission: EM | Admit: 2015-07-01 | Discharge: 2015-07-01 | Disposition: A | Discharge status: Home / Self Care | Payer: PRIVATE HEALTH INSURANCE | Attending: Emergency Medicine | Admitting: Emergency Medicine

## 2015-07-01 ENCOUNTER — Emergency Department (HOSPITAL_COMMUNITY): Payer: Self-pay

## 2015-07-01 DIAGNOSIS — Z3202 Encounter for pregnancy test, result negative: Secondary | ICD-10-CM | POA: Insufficient documentation

## 2015-07-01 DIAGNOSIS — R062 Wheezing: Secondary | ICD-10-CM | POA: Insufficient documentation

## 2015-07-01 DIAGNOSIS — F17210 Nicotine dependence, cigarettes, uncomplicated: Secondary | ICD-10-CM | POA: Insufficient documentation

## 2015-07-01 DIAGNOSIS — Z79899 Other long term (current) drug therapy: Secondary | ICD-10-CM | POA: Insufficient documentation

## 2015-07-01 DIAGNOSIS — K5900 Constipation, unspecified: Secondary | ICD-10-CM | POA: Insufficient documentation

## 2015-07-01 DIAGNOSIS — M7981 Nontraumatic hematoma of soft tissue: Secondary | ICD-10-CM | POA: Insufficient documentation

## 2015-07-01 DIAGNOSIS — R197 Diarrhea, unspecified: Secondary | ICD-10-CM | POA: Insufficient documentation

## 2015-07-01 DIAGNOSIS — R6883 Chills (without fever): Secondary | ICD-10-CM | POA: Insufficient documentation

## 2015-07-01 DIAGNOSIS — R42 Dizziness and giddiness: Secondary | ICD-10-CM | POA: Insufficient documentation

## 2015-07-01 DIAGNOSIS — R1084 Generalized abdominal pain: Secondary | ICD-10-CM | POA: Insufficient documentation

## 2015-07-01 DIAGNOSIS — R112 Nausea with vomiting, unspecified: Secondary | ICD-10-CM | POA: Insufficient documentation

## 2015-07-01 DIAGNOSIS — F419 Anxiety disorder, unspecified: Secondary | ICD-10-CM | POA: Insufficient documentation

## 2015-07-01 DIAGNOSIS — R51 Headache: Secondary | ICD-10-CM | POA: Insufficient documentation

## 2015-07-01 LAB — URINE MICROSCOPIC-ADD ON

## 2015-07-01 LAB — COMPREHENSIVE METABOLIC PANEL
ALK PHOS: 57 U/L (ref 38–126)
ALT: 18 U/L (ref 14–54)
ANION GAP: 10 (ref 5–15)
AST: 19 U/L (ref 15–41)
Albumin: 3.9 g/dL (ref 3.5–5.0)
BILIRUBIN TOTAL: 0.5 mg/dL (ref 0.3–1.2)
BUN: 9 mg/dL (ref 6–20)
CALCIUM: 9.1 mg/dL (ref 8.9–10.3)
CO2: 21 mmol/L — AB (ref 22–32)
CREATININE: 0.57 mg/dL (ref 0.44–1.00)
Chloride: 109 mmol/L (ref 101–111)
GFR calc non Af Amer: >60 mL/min (ref >60)
GLUCOSE: 122 mg/dL — AB (ref 65–99)
Potassium: 3.8 mmol/L (ref 3.5–5.1)
SODIUM: 140 mmol/L (ref 135–145)
TOTAL PROTEIN: 6.9 g/dL (ref 6.5–8.1)

## 2015-07-01 LAB — URINALYSIS, ROUTINE W REFLEX MICROSCOPIC
Bilirubin Urine: NEGATIVE
GLUCOSE, UA: NEGATIVE mg/dL
KETONES UR: NEGATIVE mg/dL
Leukocytes, UA: NEGATIVE
Nitrite: NEGATIVE
PH: 6.5 (ref 5.0–8.0)
PROTEIN: NEGATIVE mg/dL
Specific Gravity, Urine: 1.02 (ref 1.005–1.030)

## 2015-07-01 LAB — ACETAMINOPHEN LEVEL: Acetaminophen (Tylenol), Serum: <10 ug/mL — ABNORMAL LOW (ref 10–30)

## 2015-07-01 LAB — CBC
HCT: 39.7 % (ref 36.0–46.0)
Hemoglobin: 13.8 g/dL (ref 12.0–15.0)
MCH: 33.9 pg (ref 26.0–34.0)
MCHC: 34.8 g/dL (ref 30.0–36.0)
MCV: 97.5 fL (ref 78.0–100.0)
PLATELETS: 340 10*3/uL (ref 150–400)
RBC: 4.07 MIL/uL (ref 3.87–5.11)
RDW: 12.6 % (ref 11.5–15.5)
WBC: 7.7 10*3/uL (ref 4.0–10.5)

## 2015-07-01 LAB — SALICYLATE LEVEL: Salicylate Lvl: <4 mg/dL (ref 2.8–30.0)

## 2015-07-01 LAB — PREGNANCY, URINE: Preg Test, Ur: NEGATIVE

## 2015-07-01 LAB — LIPASE, BLOOD: Lipase: 23 U/L (ref 11–51)

## 2015-07-01 MED ORDER — IOHEXOL 300 MG/ML  SOLN
75.0000 mL | Freq: Once | INTRAMUSCULAR | Status: AC | PRN
  Administered 2015-07-01: 75 mL via INTRAVENOUS

## 2015-07-01 MED ORDER — SODIUM CHLORIDE 0.9 % IV BOLUS (SEPSIS)
1000.0000 mL | Freq: Once | INTRAVENOUS | Status: AC
  Administered 2015-07-01: 1000 mL via INTRAVENOUS

## 2015-07-01 MED ORDER — ONDANSETRON HCL 4 MG PO TABS: 4.0000 mg | ORAL_TABLET | Freq: Four times a day (QID) | ORAL | Status: DC

## 2015-07-01 NOTE — ED Notes (Signed)
Pt returns from CT scan

## 2015-07-01 NOTE — ED Provider Notes (Signed)
CSN: [id number]     Arrival date & time 07/01/15  0845 History   First MD Initiated Contact with Patient 07/01/15 [phone]     Chief Complaint  Patient presents with  . Abdominal Pain   35 year old female present with generalized abdominal pain. She reports she has had issues with abdominal pain for several months but it has never been this bad. She states at [facility] it became acutely worse and she had multiple episodes of vomiting. Feels like cramping at times. She reports associated chills, mild HA, lightheadedness, and alternating diarrhea and constipation.  She was recently admitted for SI on 3/18. Denying SI at this time or taking excessive amounts of medication however she reports feeling very anxious due to being out of Xanax for the past week. LMP was last week which was normal. Denies fever, chest pain, SOB, dysuria, vaginal discharge or bleeding. She took a Prilosec at home without relief. Was given Zofran  IV which has improved her symptoms here in the ED.  HPI  Past Medical History  Diagnosis Date  . Depression   . Bipolar 1 disorder ([facility])   . Anxiety   . Drug abuse   . ETOH abuse   . Polysubstance abuse   . Benzodiazepine abuse    History reviewed. No pertinent past surgical history. No family history on file. Social History  Substance Use Topics  . Smoking status: Current Every Day Smoker -- 2.00 packs/day for 17 years    Types: Cigarettes  . Smokeless tobacco: Never Used  . Alcohol Use: 7.2 oz/week    12 Cans of beer per week     Comment: 12 pack of beer per day plus liquor or wine, whatever is available and fits in her pocket at work   OB History    No data available     Review of Systems  Constitutional: Positive for chills. Negative for fever.  Respiratory: Negative for shortness of breath.   Cardiovascular: Negative for chest pain.  Gastrointestinal: Positive for nausea, vomiting, abdominal pain and diarrhea.  Genitourinary: Negative for dysuria, urgency and  pelvic pain.  Neurological: Positive for light-headedness.  Psychiatric/Behavioral: Negative for suicidal ideas. The patient is nervous/anxious.   All other systems reviewed and are negative.     Allergies  Haldol  Home Medications   Prior to Admission medications   Medication Sig Start Date End Date Taking? Authorizing Provider  benztropine (COGENTIN) 0.5 MG tablet Take 0.5 mg by mouth 2 (two) times daily.    Historical Provider, MD  carbamazepine (TEGRETOL) 200 MG tablet Take 100 mg by mouth 3 (three) times daily.    Historical Provider, MD  haloperidol (HALDOL) 2 MG tablet Take 2 mg by mouth 3 (three) times daily.    Historical Provider, MD  omeprazole (PRILOSEC) 20 MG capsule Take 20 mg by mouth daily.    Historical Provider, MD  zolpidem (AMBIEN) 5 MG tablet Take 5 mg by mouth at bedtime as needed for sleep.    Historical Provider, MD   BP 117/76 mmHg  Pulse 59  Temp(Src) 98.6 F (37 C) (Oral)  Resp 16  Ht  (1.575 m)  Wt 79.379 kg  BMI 32.00 kg/m2  SpO2 96%  LMP 06/24/2015   Physical Exam  Constitutional: She is oriented to person, place, and time. She appears well-developed and well-nourished. No distress.  HENT:  Head: Normocephalic and atraumatic.  Eyes: Conjunctivae are normal. Pupils are equal, round, and reactive to light. No scleral icterus.  Neck: No JVD present.  Cardiovascular: Normal rate and regular rhythm.  Exam reveals no gallop and no friction rub.   No murmur heard. Pulmonary/Chest: Effort normal. No respiratory distress. She has wheezes. She has no rales. She exhibits no tenderness.  Faint expiratory wheezes in the RUL and LUL  Abdominal: Soft. Bowel sounds are normal. She exhibits no shifting dullness, no distension, no ascites, no pulsatile midline mass and no mass. There is generalized tenderness. There is tenderness at McBurney's point. There is no rigidity, no rebound, no guarding, no CVA tenderness and negative Murphy's sign.  Neurological:  She is alert and oriented to person, place, and time.  Skin: Skin is warm and dry. She is not diaphoretic.  Bruising across the lower abdomen  Psychiatric: She has a normal mood and affect.    ED Course  Procedures (including critical care time) Labs Review Labs Reviewed  COMPREHENSIVE METABOLIC PANEL - Abnormal; Notable for the following:    CO2 21 (*)    Glucose, Bld 122 (*)    All other components within normal limits  URINALYSIS, ROUTINE W REFLEX MICROSCOPIC (NOT AT [facility]) - Abnormal; Notable for the following:    APPearance CLOUDY (*)    Hgb urine dipstick TRACE (*)    All other components within normal limits  ACETAMINOPHEN LEVEL - Abnormal; Notable for the following:    Acetaminophen (Tylenol), Serum <10 (*)    All other components within normal limits  URINE MICROSCOPIC-ADD ON - Abnormal; Notable for the following:    Squamous Epithelial / LPF 0-5 (*)    Bacteria, UA RARE (*)    Crystals CA OXALATE CRYSTALS (*)    All other components within normal limits  LIPASE, BLOOD  CBC  SALICYLATE LEVEL  PREGNANCY, URINE    Imaging Review Ct Abdomen Pelvis W Contrast  07/01/2015  CLINICAL DATA:  Diffuse abdominal pain, nausea and vomiting starting 3:30 a.m. today EXAM: CT ABDOMEN AND PELVIS WITH CONTRAST TECHNIQUE: Multidetector CT imaging of the abdomen and pelvis was performed using the standard protocol following bolus administration of intravenous contrast. CONTRAST:  75mL OMNIPAQUE IOHEXOL 300 MG/ML  SOLN COMPARISON:  11/21/2009 FINDINGS: Lower chest:  The lung bases are unremarkable. Hepatobiliary: Enhanced liver is unremarkable. No focal hepatic mass. No calcified gallstones are noted within gallbladder. No CBD dilatation. Pancreas: No mass, inflammatory changes, or other significant abnormality. Spleen: Within normal limits in size and appearance. Adrenals/Urinary Tract: No adrenal gland mass is noted. Enhanced kidneys are symmetrical in size. No hydronephrosis or hydroureter.  The urinary bladder is under distended grossly unremarkable. Stomach/Bowel: No gastric outlet obstruction. No small bowel obstruction. There is minimal distended short segment small bowel mid lower abdomen with some fluid and gas. Please see axial image 38 and coronal image 49. There is no thickening of small bowel wall. No transition in caliber of small bowel. Nonspecific mild enteritis cannot be excluded. No evidence of surrounding inflammation. There is a low lying cecum. Some colonic stool noted within cecum. No pericecal inflammation. Normal appendix is clearly visualized in right posterior pelvis presacral region axial image 61. There is no colitis or diverticulitis. Few diverticula are noted descending colon. Vascular/Lymphatic: No aortic aneurysm. No retroperitoneal or mesenteric adenopathy. No inguinal adenopathy. Small nonspecific bilateral inguinal lymph nodes are noted. Reproductive: Minimal retroflexed uterus. No adnexal mass. No pelvic free fluid is noted. Other: No abdominal ascites or free air. No mesenteric fluid collection. Musculoskeletal: Minimal degenerative changes lower thoracic spine with anterior spurring. No destructive bony lesions  are noted. No pelvic fractures are noted. IMPRESSION: 1. No small bowel obstruction. There is minimal distended short segment small bowel mid lower abdomen with some fluid and gas. Please see axial image 38 and coronal image 49. There is no thickening of small bowel wall. No transition in caliber of small bowel. Nonspecific mild enteritis cannot be excluded. No evidence of surrounding inflammation. 2. There is a low lying cecum. No pericecal inflammation. Normal appendix. 3. No small bowel obstruction. 4. No hydronephrosis or hydroureter. 5. Minimal retroflexed uterus.  No adnexal mass. Electronically Signed   By: Natasha MeadLiviu  Pop M.D.   On: 07/01/2015 12:21   I have personally reviewed and evaluated these images and lab results as part of my medical  decision-making.   EKG Interpretation None      MDM   Final diagnoses:  Nausea vomiting and diarrhea   35 year old female presents with <24 hrs of N/V/D. She is afebrile and VSS, NAD, non-toxic. She reports generalized abdominal pain. CT of abdomen suggests mild enteritis. Administration of Zofran has vastly improved symptoms. Pt tolerating PO. Pt was discharged with Zofran and advised to drink plenty of fluids. Pt feels improved after observation and/or treatment in ED. Patient  informed of clinical course, understand medical decision-making process, and agree with plan.    Bethel BornKelly Marie Gekas, PA-C 07/01/15 1507  Eber HongBrian Miller, MD 07/03/15 [phone]

## 2015-07-01 NOTE — ED Provider Notes (Signed)
35 year old female, long history of substance abuse, presents with abdominal discomfort in the lower abdomen with nausea and vomiting, she also reports diarrhea. After medications the patient is essentially symptom-free, on my exam she is very soft, nontender, I examined her while she is on her phone and she does not even take her eyes off of her phone texting. Labs reviewed, CT scan reviewed, the patient was informed of her results, she expressed her understanding, she appears stable for discharge.  Medical screening examination/treatment/procedure(s) were conducted as a shared visit with non-physician practitioner(s) and myself.  I personally evaluated the patient during the encounter.  Clinical Impression:   Final diagnoses:  Nausea vomiting and diarrhea         Eber HongBrian Taelor Moncada, MD 07/03/15 [phone]

## 2015-07-01 NOTE — Discharge Instructions (Signed)

## 2015-07-01 NOTE — ED Notes (Signed)
Per EMS- pt reports N/V and intermittent generalized abdominal pain since last night at 330am. Pt reports vomitting x2. OTC medications not working. Pt has been detoxing from xanax for 1 week as well. Pt reports eating [employer] last night and questioning possible food poisoning. Pt received 4 mg Zofran en route. Pt also reported to have some abdominal bruising.

## 2015-07-01 NOTE — ED Notes (Signed)
Patient transported to CT 

## 2015-11-01 ENCOUNTER — Ambulatory Visit (HOSPITAL_COMMUNITY)
Admission: AD | Admit: 2015-11-01 | Discharge: 2015-11-01 | Disposition: A | Discharge status: Home / Self Care | Payer: Self-pay | Attending: Psychiatry | Admitting: Psychiatry

## 2015-11-01 DIAGNOSIS — F17210 Nicotine dependence, cigarettes, uncomplicated: Secondary | ICD-10-CM | POA: Insufficient documentation

## 2015-11-01 DIAGNOSIS — F419 Anxiety disorder, unspecified: Secondary | ICD-10-CM | POA: Insufficient documentation

## 2015-11-01 DIAGNOSIS — F1010 Alcohol abuse, uncomplicated: Secondary | ICD-10-CM | POA: Insufficient documentation

## 2015-11-01 DIAGNOSIS — F319 Bipolar disorder, unspecified: Secondary | ICD-10-CM | POA: Insufficient documentation

## 2015-11-01 DIAGNOSIS — F1910 Other psychoactive substance abuse, uncomplicated: Secondary | ICD-10-CM | POA: Insufficient documentation

## 2015-11-01 DIAGNOSIS — F1320 Sedative, hypnotic or anxiolytic dependence, uncomplicated: Secondary | ICD-10-CM | POA: Insufficient documentation

## 2015-11-01 NOTE — BH Assessment (Addendum)
Assessment Note  [name] is a 35 y.o. female who voluntarily presented to [facility] as a walk in due to increased anxiety and lack of sleep possibly x 2 weeks. Pt appeared quite manic and anxious, repeatedly saying, "what are we talking about" and "I don't know what I'm saying". Pt denied SI, HI, AVH. Pt also denied any drug/alcohol use in the past 6 months. Pt shared that she had an appt set up for [facility] for 7/27th at 10am. Due to her apparent helpless and anxious state, IP was recommended, but pt declined when advised that she would have to get medically cleared. Pt became slightly irate and walked out, saying, "Yall ain't keeping me here". Pt was allowed to leave as she did not meet any criteria for IVC admission.   Diagnosis: Bipolar, current or most recent episode manic  Past Medical History:  Past Medical History:  Diagnosis Date  . Anxiety   . Benzodiazepine abuse   . Bipolar 1 disorder (HCC)   . Depression   . Drug abuse   . ETOH abuse   . Polysubstance abuse     No past surgical history on file.  Family History: No family history on file.  Social History:  reports that she has been smoking Cigarettes.  She has a 34.00 pack-year smoking history. She has never used smokeless tobacco. She reports that she drinks about 7.2 oz of alcohol per week . She reports that she uses drugs, including Heroin, Benzodiazepines, Marijuana, and IV, about 7 times per week.  Additional Social History:  Alcohol / Drug Use Pain Medications: none reported Prescriptions: none reported Over the Counter: none reported History of alcohol / drug use?: Yes Longest period of sobriety (when/how long): Pt indicates that she's been clean for 6 months  CIWA:   COWS:    Allergies:  Allergies  Allergen Reactions  . Haldol [Haloperidol Lactate] Other (See Comments)    Makes whole body stiff    Home Medications:  (Not in a hospital admission)  OB/GYN Status:  No LMP recorded.  General  Assessment Data Location of Assessment: [facility] Assessment Services TTS Assessment: In system Is this a Tele or Face-to-Face Assessment?: Face-to-Face Is this an Initial Assessment or a Re-assessment for this encounter?: Initial Assessment Marital status: Single Is patient pregnant?: No Pregnancy Status: No Living Arrangements: Non-relatives/Friends Can pt return to current living arrangement?: Yes Admission Status: Voluntary Is patient capable of signing voluntary admission?: Yes Referral Source: Self/Family/Friend Insurance type: none  Medical Screening Exam [facility] Walk-in ONLY) Medical Exam completed: No Reason for MSE not completed: Patient Refused  Crisis Care Plan Living Arrangements: Non-relatives/Friends Name of Psychiatrist: none Name of Therapist: none  Education Status Is patient currently in school?: No  Risk to self with the past 6 months Suicidal Ideation: No Has patient been a risk to self within the past 6 months prior to admission? : No Suicidal Intent: No Has patient had any suicidal intent within the past 6 months prior to admission? : No Is patient at risk for suicide?: No Suicidal Plan?: No Has patient had any suicidal plan within the past 6 months prior to admission? : No Access to Means: No What has been your use of drugs/alcohol within the last 12 months?: pt has hx of drug use Previous Attempts/Gestures: No Intentional Self Injurious Behavior: None Family Suicide History: No Recent stressful life event(s): Other (Comment) (can't sleep) Persecutory voices/beliefs?: No Depression: No Depression Symptoms: Feeling angry/irritable Substance abuse history and/or treatment for  substance abuse?: Yes Suicide prevention information given to non-admitted patients: Not applicable  Risk to Others within the past 6 months Homicidal Ideation: No Does patient have any lifetime risk of violence toward others beyond the six months prior to admission? : No Thoughts  of Harm to Others: No Current Homicidal Intent: No Current Homicidal Plan: No Access to Homicidal Means: No History of harm to others?: No Assessment of Violence: None Noted Does patient have access to weapons?: No Criminal Charges Pending?: No Does patient have a court date: No Is patient on probation?: No  Psychosis Hallucinations: None noted Delusions: None noted  Mental Status Report Appearance/Hygiene: Unremarkable Eye Contact: Fair Motor Activity: Restlessness Speech: Logical/coherent, Pressured Level of Consciousness: Alert, Irritable Mood: Anxious, Fearful, Irritable Affect: Appropriate to circumstance, Anxious Anxiety Level: Moderate Thought Processes: Coherent, Relevant Judgement: Partial Orientation: Person, Place, Time, Situation, Appropriate for developmental age Obsessive Compulsive Thoughts/Behaviors: None  Cognitive Functioning Concentration: Decreased Memory: Unable to Assess IQ: Average Insight: see judgement above Impulse Control: Good Appetite: Poor Sleep: Decreased Total Hours of Sleep: 0 Vegetative Symptoms: None  ADLScreening [facility] Assessment Services) Patient's cognitive ability adequate to safely complete daily activities?: Yes Patient able to express need for assistance with ADLs?: Yes Independently performs ADLs?: Yes (appropriate for developmental age)  Prior Inpatient Therapy Prior Inpatient Therapy: Yes Prior Therapy Dates: several times Prior Therapy Facilty/Provider(s): [facility] Reason for Treatment: detox; bipolar  Prior Outpatient Therapy Prior Outpatient Therapy: Yes Prior Therapy Facilty/Provider(s): [facility] Reason for Treatment: anxiety Does patient have an ACCT team?: No Does patient have Intensive In-House Services?  : No Does patient have [facility] services? : Yes Does patient have P4CC services?: No  ADL Screening (condition at time of admission) Patient's cognitive ability adequate to safely complete daily activities?:  Yes Is the patient deaf or have difficulty hearing?: No Does the patient have difficulty seeing, even when wearing glasses/contacts?: No Does the patient have difficulty concentrating, remembering, or making decisions?: Yes Patient able to express need for assistance with ADLs?: Yes Does the patient have difficulty dressing or bathing?: No Independently performs ADLs?: Yes (appropriate for developmental age) Does the patient have difficulty walking or climbing stairs?: No Weakness of Legs: None Weakness of Arms/Hands: None  Home Assistive Devices/Equipment Home Assistive Devices/Equipment: None  Therapy Consults (therapy consults require a physician order) PT Evaluation Needed: No OT Evalulation Needed: No SLP Evaluation Needed: No Abuse/Neglect Assessment (Assessment to be complete while patient is alone) Physical Abuse: Denies Verbal Abuse: Denies Sexual Abuse: Denies Exploitation of patient/patient's resources: Denies Self-Neglect: Denies Values / Beliefs Cultural Requests During Hospitalization: None Spiritual Requests During Hospitalization: None Consults Spiritual Care Consult Needed: No Social Work Consult Needed: No Merchant navy officer (For Healthcare) Does patient have an advance directive?: No Would patient like information on creating an advanced directive?: No - patient declined information    Additional Information 1:1 In Past 12 Months?: Yes CIRT Risk: Yes Elopement Risk: No Does patient have medical clearance?: No     Disposition:  Disposition Initial Assessment Completed for this Encounter: Yes (consulted with Fransisca Kaufmann, NP) Disposition of Patient: Treatment offered and refused Type of treatment offered and refused: In-patient  On Site Evaluation by:   Reviewed with Physician:    Laddie Aquas 11/01/2015 5:03 PM

## 2015-11-05 ENCOUNTER — Emergency Department (HOSPITAL_COMMUNITY)
Admission: EM | Admit: 2015-11-05 | Discharge: 2015-11-06 | Disposition: A | Discharge status: Home / Self Care | Payer: Self-pay | Attending: Emergency Medicine | Admitting: Emergency Medicine

## 2015-11-05 ENCOUNTER — Encounter (HOSPITAL_COMMUNITY): Payer: Self-pay | Admitting: *Deleted

## 2015-11-05 DIAGNOSIS — F17210 Nicotine dependence, cigarettes, uncomplicated: Secondary | ICD-10-CM | POA: Insufficient documentation

## 2015-11-05 DIAGNOSIS — Z79899 Other long term (current) drug therapy: Secondary | ICD-10-CM | POA: Insufficient documentation

## 2015-11-05 DIAGNOSIS — F419 Anxiety disorder, unspecified: Secondary | ICD-10-CM | POA: Insufficient documentation

## 2015-11-05 DIAGNOSIS — R413 Other amnesia: Secondary | ICD-10-CM | POA: Insufficient documentation

## 2015-11-05 LAB — I-STAT BETA HCG BLOOD, ED (MC, WL, AP ONLY)

## 2015-11-05 LAB — COMPREHENSIVE METABOLIC PANEL
ALBUMIN: 4.4 g/dL (ref 3.5–5.0)
ALK PHOS: 53 U/L (ref 38–126)
ALT: 14 U/L (ref 14–54)
AST: 14 U/L — AB (ref 15–41)
Anion gap: 8 (ref 5–15)
BILIRUBIN TOTAL: 0.6 mg/dL (ref 0.3–1.2)
BUN: 12 mg/dL (ref 6–20)
CALCIUM: 9.2 mg/dL (ref 8.9–10.3)
CO2: 20 mmol/L — ABNORMAL LOW (ref 22–32)
Chloride: 105 mmol/L (ref 101–111)
Creatinine, Ser: 0.67 mg/dL (ref 0.44–1.00)
GFR calc Af Amer: >60 mL/min (ref >60)
GLUCOSE: 114 mg/dL — AB (ref 65–99)
Potassium: 3.5 mmol/L (ref 3.5–5.1)
Sodium: 133 mmol/L — ABNORMAL LOW (ref 135–145)
TOTAL PROTEIN: 6.9 g/dL (ref 6.5–8.1)

## 2015-11-05 LAB — URINE MICROSCOPIC-ADD ON

## 2015-11-05 LAB — URINALYSIS, ROUTINE W REFLEX MICROSCOPIC
GLUCOSE, UA: NEGATIVE mg/dL
KETONES UR: 15 mg/dL — AB
Leukocytes, UA: NEGATIVE
Nitrite: NEGATIVE
PROTEIN: NEGATIVE mg/dL
Specific Gravity, Urine: 1.03 (ref 1.005–1.030)
pH: 5.5 (ref 5.0–8.0)

## 2015-11-05 LAB — CBC
HCT: 39.7 % (ref 36.0–46.0)
Hemoglobin: 13.6 g/dL (ref 12.0–15.0)
MCH: 33.3 pg (ref 26.0–34.0)
MCHC: 34.3 g/dL (ref 30.0–36.0)
MCV: 97.1 fL (ref 78.0–100.0)
PLATELETS: 343 10*3/uL (ref 150–400)
RBC: 4.09 MIL/uL (ref 3.87–5.11)
RDW: 11.9 % (ref 11.5–15.5)
WBC: 7.7 10*3/uL (ref 4.0–10.5)

## 2015-11-05 LAB — ETHANOL: Alcohol, Ethyl (B): <5 mg/dL (ref <5)

## 2015-11-05 MED ORDER — CLONIDINE HCL 0.1 MG PO TABS
0.1000 mg | ORAL_TABLET | Freq: Once | ORAL | Status: AC
  Administered 2015-11-05: 0.1 mg via ORAL
  Filled 2015-11-05: qty 1

## 2015-11-05 NOTE — ED Triage Notes (Signed)
Pt reports episodes of confusion and memory loss over past week. Most recent episode was last night and reports not being able to remember where she is etc. Memory loss is causing pt to have increase in anxiety and panic attacks. Appears anxious at triage, a &ox4 at this time.

## 2015-11-05 NOTE — ED Provider Notes (Signed)
[facility]-EMERGENCY DEPT Provider Note   CSN: [id number] Arrival date & time: 11/05/15  1639  First Provider Contact:  First MD Initiated Contact with Patient 11/05/15 2224        History   Chief Complaint Chief Complaint  Patient presents with  . Memory Loss  . Anxiety    HPI [name] is a 35 y.o. female.  HPI   [name] is a 35 y/o WF presenting with 3 episodes of memory loss in the past week, lasting 6-12 hrs, currently asymptomatic. Pt denies a previous h/o of memory loss prior to this. During these episodes, she will continually ask repeat questions, and is unable to recognize her surroundings or who the contacts in her phone are. She denies motor deficits during these events or other neurologic deficits. She denies numbness, tingling, or weakness. She denies slurred speech or dysphasia. Her first episode was following several days of no sleep, but she has been sleeping well this last week.  She has a h/o anxiety, depression, bipolar disorder type 1, etoh abuse, and other drug use. She denies alcohol use in the last few months and quit smoking marijuana 2 wks ago. She denies other drug use. She denies any recent traumas, falls or injury. She denies and increase in stress the last few weeks.  Current medications include Benadryl (started 4 days ago), goodie powders qhs, and Prilosec. She does not take any medicines for psych.   Past Medical History:  Diagnosis Date  . Anxiety   . Benzodiazepine abuse   . Bipolar 1 disorder (HCC)   . Depression   . Drug abuse   . ETOH abuse   . Polysubstance abuse     Patient Active Problem List   Diagnosis Date Noted  . Substance induced mood disorder (HCC) 06/28/2015  . Polysubstance dependence including opioid type drug, episodic abuse (HCC) 06/03/2015  . Polysubstance abuse 11/01/2013  . Benzodiazepine dependence (HCC) 11/01/2013  . Bipolar I disorder, most recent episode (or current) manic, unspecified 02/25/2013  .  Substance abuse/dependence 02/21/2013    History reviewed. No pertinent surgical history.  OB History    No data available       Home Medications    Prior to Admission medications   Medication Sig Start Date End Date Taking? Authorizing Provider  omeprazole (PRILOSEC) 20 MG capsule Take 20 mg by mouth daily.   Yes Historical Provider, MD  ondansetron (ZOFRAN) 4 MG tablet Take 1 tablet (4 mg total) by mouth every 6 (six) hours. Patient not taking: Reported on 11/06/2015 07/01/15   Bethel Born, PA-C    Family History History reviewed. No pertinent family history.  Social History Social History  Substance Use Topics  . Smoking status: Current Every Day Smoker    Packs/day: 2.00    Years: 17.00    Types: Cigarettes  . Smokeless tobacco: Never Used  . Alcohol use 7.2 oz/week    12 Cans of beer per week     Comment: 12 pack of beer per day plus liquor or wine, whatever is available and fits in her pocket at work     Allergies   Haldol [haloperidol lactate]   Review of Systems Review of Systems  Review of Systems All other systems negative except as documented in the HPI. All pertinent positives and negatives as reviewed in the HPI.  Physical Exam Updated Vital Signs BP 103/85   Pulse (!) 51   Temp 98.4 F (36.9 C)   Resp 21  Ht 5\' 2"  (1.575 m)   Wt 73.5 kg   LMP 10/09/2015   SpO2 100%   BMI 29.65 kg/m   Physical Exam  Constitutional: She appears well-developed and well-nourished. No distress.  HENT:  Head: Normocephalic and atraumatic.  Right Ear: Tympanic membrane and ear canal normal.  Left Ear: Tympanic membrane and ear canal normal.  Nose: Nose normal.  Mouth/Throat: Uvula is midline, oropharynx is clear and moist and mucous membranes are normal.  Eyes: Pupils are equal, round, and reactive to light.  Neck: Normal range of motion. Neck supple.  Cardiovascular: Normal rate and regular rhythm.   Pulmonary/Chest: Effort normal.  Abdominal: Soft.   No signs of abdominal distention  Musculoskeletal:  No LE swelling  Neurological: She is alert.  Acting at baseline Cranial nerves grossly intact on exam. Pt alert and oriented x 3 Upper and lower extremity strength is symmetrical and physiologic Normal muscular tone No facial droop Coordination intact, no limb ataxia,No pronator drift  Skin: Skin is warm and dry. No rash noted.  Nursing note and vitals reviewed.    ED Treatments / Results  Labs (all labs ordered are listed, but only abnormal results are displayed) Labs Reviewed  COMPREHENSIVE METABOLIC PANEL - Abnormal; Notable for the following:       Result Value   Sodium 133 (*)    CO2 20 (*)    Glucose, Bld 114 (*)    AST 14 (*)    All other components within normal limits  URINALYSIS, ROUTINE W REFLEX MICROSCOPIC (NOT AT [facility]) - Abnormal; Notable for the following:    Color, Urine AMBER (*)    Hgb urine dipstick SMALL (*)    Bilirubin Urine SMALL (*)    Ketones, ur 15 (*)    All other components within normal limits  URINE MICROSCOPIC-ADD ON - Abnormal; Notable for the following:    Squamous Epithelial / LPF 0-5 (*)    Bacteria, UA FEW (*)    Crystals CA OXALATE CRYSTALS (*)    All other components within normal limits  URINE RAPID DRUG SCREEN, HOSP PERFORMED - Abnormal; Notable for the following:    Tetrahydrocannabinol POSITIVE (*)    All other components within normal limits  CBC  ETHANOL  I-STAT BETA HCG BLOOD, ED ([facility], [facility], [facility] ONLY)    EKG  EKG Interpretation  Date/Time:  Friday November 05 2015 23:32:25 EDT Ventricular Rate:  81 PR Interval:    QRS Duration: 92 QT Interval:  387 QTC Calculation: 450 R Axis:   34 Text Interpretation:  Sinus rhythm Normal ECG When compared with ECG of 06/26/2015, No significant change was found Confirmed by [facility]  MD, DAVID ([id number]) on 11/05/2015 11:36:35 PM       Radiology Ct Head Wo Contrast  Result Date: 11/06/2015 CLINICAL DATA:  Pt with episode of intermittent  memory loss.H/O bipolar disorder; anxiety EXAM: CT HEAD WITHOUT CONTRAST TECHNIQUE: Contiguous axial images were obtained from the base of the skull through the vertex without intravenous contrast. COMPARISON:  None. FINDINGS: The ventricles are normal in size and configuration. There are no parenchymal masses or mass effect, no evidence of an infarct, no extra-axial masses or abnormal fluid collections and no intracranial hemorrhage. The visualized sinuses and mastoid air cells are clear. No skull lesion. IMPRESSION: Normal unenhanced CT scan of the brain. Electronically Signed   By: Amie Portland M.D.   On: 11/06/2015 00:37   Procedures Procedures (including critical care time)  Medications Ordered in ED  Medications  cloNIDine (CATAPRES) tablet 0.1 mg (0.1 mg Oral Given 11/05/15 2311)     Initial Impression / Assessment and Plan / ED Course  I have reviewed the triage vital signs and the nursing notes.  Pertinent labs & imaging results that were available during my care of the patient were reviewed by me and considered in my medical decision making (see chart for details).  Clinical Course    1:41 am Patient does have psych and drug abuse hx but reports never having symptoms consistent with memory loss in the past and they have been intermittent. I discussed the case with Dr. Preston Fleeting who recommends TIA work-up. He believes she needs MRI and an EEG in the morning. He requests I ask neurology to consult and hospitalists to admit for TIA work-up.  3;15 am Dr. Preston Fleeting and Dr. Julian Reil no longer feel that the patient needs inpatient treatment or work-up. After reviewing the chart they feel her symptoms are likely psychiatric. Dr. Preston Fleeting has recommended she get a referral for outpatient neurology and f/u with her psychiatrist.  I dicussed this plan with the patient who is agreeable. Her work-up in the ER is overall unremarkable and she is currently eating [employer]ione [employer]  At time discharge.  Medications    cloNIDine (CATAPRES) tablet 0.1 mg (0.1 mg Oral Given 11/05/15 2311)    I discussed results, diagnoses and plan with [name]. They voice there understanding and questions were answered. We discussed follow-up recommendations and return precautions.   Final Clinical Impressions(s) / ED Diagnoses   Final diagnoses:  Memory loss    New Prescriptions New Prescriptions   No medications on file     Marlon Pel, Cordelia Poche 11/06/15 0316    Nelva Nay, MD 11/11/15 [phone]

## 2015-11-06 ENCOUNTER — Emergency Department (HOSPITAL_COMMUNITY): Payer: Self-pay

## 2015-11-06 LAB — RAPID URINE DRUG SCREEN, HOSP PERFORMED
AMPHETAMINES: NOT DETECTED
BARBITURATES: NOT DETECTED
BENZODIAZEPINES: NOT DETECTED
COCAINE: NOT DETECTED
OPIATES: NOT DETECTED
TETRAHYDROCANNABINOL: POSITIVE — AB

## 2015-11-06 NOTE — ED Notes (Signed)
Report given to [facility]

## 2015-11-06 NOTE — ED Notes (Signed)
Patient returned from CT with complains of an acute onset headache.  Denies any dizziness of visual changes

## 2015-11-06 NOTE — ED Notes (Signed)
Pt departed in NAD, refused use of wheelchair.  

## 2016-01-28 ENCOUNTER — Emergency Department (HOSPITAL_COMMUNITY)
Admission: EM | Admit: 2016-01-28 | Discharge: 2016-01-28 | Disposition: A | Discharge status: Home / Self Care | Payer: Self-pay | Attending: Emergency Medicine | Admitting: Emergency Medicine

## 2016-01-28 ENCOUNTER — Encounter (HOSPITAL_COMMUNITY): Payer: Self-pay | Admitting: *Deleted

## 2016-01-28 DIAGNOSIS — M79602 Pain in left arm: Secondary | ICD-10-CM | POA: Insufficient documentation

## 2016-01-28 DIAGNOSIS — F17210 Nicotine dependence, cigarettes, uncomplicated: Secondary | ICD-10-CM | POA: Insufficient documentation

## 2016-01-28 DIAGNOSIS — Z79899 Other long term (current) drug therapy: Secondary | ICD-10-CM | POA: Insufficient documentation

## 2016-01-28 LAB — CBC WITH DIFFERENTIAL/PLATELET
BASOS ABS: 0 10*3/uL (ref 0.0–0.1)
BASOS PCT: 0 %
EOS ABS: 0.1 10*3/uL (ref 0.0–0.7)
EOS PCT: 1 %
HCT: 37.3 % (ref 36.0–46.0)
Hemoglobin: 12.8 g/dL (ref 12.0–15.0)
Lymphocytes Relative: 30 %
Lymphs Abs: 2.1 10*3/uL (ref 0.7–4.0)
MCH: 33.3 pg (ref 26.0–34.0)
MCHC: 34.3 g/dL (ref 30.0–36.0)
MCV: 97.1 fL (ref 78.0–100.0)
Monocytes Absolute: 0.5 10*3/uL (ref 0.1–1.0)
Monocytes Relative: 7 %
Neutro Abs: 4.3 10*3/uL (ref 1.7–7.7)
Neutrophils Relative %: 62 %
PLATELETS: 347 10*3/uL (ref 150–400)
RBC: 3.84 MIL/uL — AB (ref 3.87–5.11)
RDW: 12.5 % (ref 11.5–15.5)
WBC: 6.9 10*3/uL (ref 4.0–10.5)

## 2016-01-28 LAB — I-STAT BETA HCG BLOOD, ED (MC, WL, AP ONLY): I-stat hCG, quantitative: <5 m[IU]/mL (ref <5)

## 2016-01-28 LAB — URINE MICROSCOPIC-ADD ON

## 2016-01-28 LAB — COMPREHENSIVE METABOLIC PANEL
ALT: 12 U/L — AB (ref 14–54)
AST: 15 U/L (ref 15–41)
Albumin: 4.2 g/dL (ref 3.5–5.0)
Alkaline Phosphatase: 54 U/L (ref 38–126)
Anion gap: 7 (ref 5–15)
BUN: 11 mg/dL (ref 6–20)
CHLORIDE: 110 mmol/L (ref 101–111)
CO2: 23 mmol/L (ref 22–32)
CREATININE: 0.68 mg/dL (ref 0.44–1.00)
Calcium: 9.1 mg/dL (ref 8.9–10.3)
GFR calc non Af Amer: >60 mL/min (ref >60)
Glucose, Bld: 109 mg/dL — ABNORMAL HIGH (ref 65–99)
Potassium: 3.4 mmol/L — ABNORMAL LOW (ref 3.5–5.1)
SODIUM: 140 mmol/L (ref 135–145)
Total Bilirubin: 0.2 mg/dL — ABNORMAL LOW (ref 0.3–1.2)
Total Protein: 6.8 g/dL (ref 6.5–8.1)

## 2016-01-28 LAB — RAPID URINE DRUG SCREEN, HOSP PERFORMED
Amphetamines: NOT DETECTED
BARBITURATES: NOT DETECTED
Benzodiazepines: NOT DETECTED
COCAINE: NOT DETECTED
Opiates: NOT DETECTED
TETRAHYDROCANNABINOL: NOT DETECTED

## 2016-01-28 LAB — URINALYSIS, ROUTINE W REFLEX MICROSCOPIC
BILIRUBIN URINE: NEGATIVE
GLUCOSE, UA: NEGATIVE mg/dL
KETONES UR: NEGATIVE mg/dL
LEUKOCYTES UA: NEGATIVE
Nitrite: NEGATIVE
PROTEIN: NEGATIVE mg/dL
Specific Gravity, Urine: 1.014 (ref 1.005–1.030)
pH: 5.5 (ref 5.0–8.0)

## 2016-01-28 LAB — I-STAT TROPONIN, ED: TROPONIN I, POC: 0 ng/mL (ref 0.00–0.08)

## 2016-01-28 MED ORDER — SODIUM CHLORIDE 0.9 % IV BOLUS (SEPSIS)
1000.0000 mL | Freq: Once | INTRAVENOUS | Status: AC
  Administered 2016-01-28: 1000 mL via INTRAVENOUS

## 2016-01-28 NOTE — Discharge Instructions (Signed)
Please read and follow all provided instructions.  Your diagnoses today include:  1. Left arm pain     Tests performed today include:  Blood counts and electrolytes  Blood test for heart - normal  EKG  Vital signs. See below for your results today.   Medications prescribed:   None  Take any prescribed medications only as directed.  Home care instructions:  Follow any educational materials contained in this packet.  BE VERY CAREFUL not to take multiple medicines containing Tylenol (also called acetaminophen). Doing so can lead to an overdose which can damage your liver and cause liver failure and possibly death.   Follow-up instructions: Please follow-up with your primary care provider in the next 3 days for further evaluation of your symptoms.   Return instructions:   Please return to the Emergency Department if you experience worsening symptoms.   Return if your arm turns color or is persistently painful or weak.  Please return if you have any other emergent concerns.  Additional Information:  Your vital signs today were: BP 115/90    Pulse 101    Temp 98.1 F (36.7 C) (Oral)    Resp 25    LMP 01/24/2016    SpO2 99%  If your blood pressure (BP) was elevated above 135/85 this visit, please have this repeated by your doctor within one month. --------------

## 2016-01-28 NOTE — ED Provider Notes (Signed)
[facility]EM[facility] DEPT Provider Note   CSN: [id number] Arrival date & time: 01/28/16  0305     History   Chief Complaint Chief Complaint  Patient presents with  . Arm Pain    HPI [name] is a 35 y.o. female with a hx of anxiety, drug abuse, bipolar disorder presents to the Emergency Department complaining of gradual, persistent, progressively worsening left arm pain onset 3 days ago.  Pt reports no new physical activity.  Pt reports the pain is just distal to the elbow and radiates to the wrist.  Denies known injury.  She reports new onset polydipsia and polyuria for the last 2 months.  She reports dizzy episodes with position changes only.  Pt reports she is feeling stressed out and "needs anxiety meds."  NO known aggravating or alleviating factors.  Pt reports she smokes 2 packs per day. Pt denies fever, chills, headache, neck pain, chest pain, SOB, abd pain, N/V/D, weakness, syncope, dysuria.   Pt reports taking 8-10 tablets of benadryl each day.     The history is provided by the patient and medical records. No language interpreter was used.    Past Medical History:  Diagnosis Date  . Anxiety   . Benzodiazepine abuse   . Bipolar 1 disorder (HCC)   . Depression   . Drug abuse   . ETOH abuse   . Polysubstance abuse     Patient Active Problem List   Diagnosis Date Noted  . Substance induced mood disorder (HCC) 06/28/2015  . Polysubstance dependence including opioid type drug, episodic abuse (HCC) 06/03/2015  . Polysubstance abuse 11/01/2013  . Benzodiazepine dependence (HCC) 11/01/2013  . Bipolar I disorder, most recent episode (or current) manic, unspecified 02/25/2013  . Substance abuse/dependence 02/21/2013    History reviewed. No pertinent surgical history.  OB History    No data available       Home Medications    Prior to Admission medications   Medication Sig Start Date End Date Taking? Authorizing Provider  diphenhydrAMINE (BENADRYL) 25 mg  capsule Take 25 mg by mouth every 6 (six) hours as needed for sleep.   Yes Historical Provider, MD  omeprazole (PRILOSEC) 20 MG capsule Take 20 mg by mouth daily.   Yes Historical Provider, MD  ondansetron (ZOFRAN) 4 MG tablet Take 1 tablet (4 mg total) by mouth every 6 (six) hours. Patient not taking: Reported on 01/28/2016 07/01/15   Bethel Born, PA-C    Family History No family history on file.  Social History Social History  Substance Use Topics  . Smoking status: Current Every Day Smoker    Packs/day: 2.00    Years: 17.00    Types: Cigarettes  . Smokeless tobacco: Never Used  . Alcohol use 7.2 oz/week    12 Cans of beer per week     Comment: 12 pack of beer per day plus liquor or wine, whatever is available and fits in her pocket at work     Allergies   Haldol [haloperidol lactate]   Review of Systems Review of Systems  Endocrine: Positive for polydipsia and polyuria. Negative for polyphagia.  Musculoskeletal: Positive for arthralgias (left arm).  Neurological: Positive for dizziness ( orthostatic).  Psychiatric/Behavioral: Positive for sleep disturbance. The patient is nervous/anxious.   All other systems reviewed and are negative.    Physical Exam Updated Vital Signs BP 115/90   Pulse 101   Temp 98.1 F (36.7 C) (Oral)   Resp 25   LMP 01/24/2016  SpO2 99%   Physical Exam  Constitutional: She appears well-developed and well-nourished. No distress.  Awake, alert, nontoxic appearance  HENT:  Head: Normocephalic and atraumatic.  Mouth/Throat: Oropharynx is clear and moist. No oropharyngeal exudate.  Eyes: Conjunctivae are normal. No scleral icterus.  Neck: Normal range of motion. Neck supple.  Cardiovascular: Regular rhythm and intact distal pulses.  Tachycardia present.   Pulses:      Radial pulses are 2+ on the right side, and 2+ on the left side.  Pulmonary/Chest: Effort normal and breath sounds normal. No respiratory distress. She has no wheezes.   Equal chest expansion  Abdominal: Soft. Bowel sounds are normal. She exhibits no mass. There is no tenderness. There is no rebound and no guarding.  Musculoskeletal: Normal range of motion. She exhibits no edema.  Left arm:    Neurological: She is alert.  Speech is clear and goal oriented Moves extremities without ataxia  Skin: Skin is warm and dry. She is not diaphoretic.  Psychiatric: She has a normal mood and affect.  Nursing note and vitals reviewed.    ED Treatments / Results   EKG  EKG Interpretation  Date/Time:  Friday January 28 2016 05:58:31 EDT Ventricular Rate:  98 PR Interval:    QRS Duration: 91 QT Interval:  339 QTC Calculation: 433 R Axis:   43 Text Interpretation:  Sinus rhythm Normal ECG No significant change since last tracing Confirmed by Bebe Shaggy  MD, Dorinda Hill ([id number]) on 01/28/2016 6:08:44 AM        Procedures Procedures (including critical care time)  Medications Ordered in ED Medications  sodium chloride 0.9 % bolus 1,000 mL (1,000 mLs Intravenous New Bag/Given 01/28/16 0559)     Initial Impression / Assessment and Plan / ED Course  I have reviewed the triage vital signs and the nursing notes.  Pertinent labs & imaging results that were available during my care of the patient were reviewed by me and considered in my medical decision making (see chart for details).  Clinical Course  Comment By Time  At shift change, care transferred to [facility], PA-C who will follow labs, re-evaluate and disposition accordingly.   Dahlia Client Nyiesha Beever, PA-C 10/20 0636    Pt with Rapid and pressured speech. She appears anxious. She is tachycardic into the 130s which occurs with agitation. Heart rate rest around 104 when she is not agitated.  She complains of polyuria and polydipsia. Workup pending.  Final Clinical Impressions(s) / ED Diagnoses   Final diagnoses:  None    New Prescriptions New Prescriptions   No medications on file     Dierdre Forth, PA-C 01/28/16 8119    Zadie Rhine, MD 01/28/16 [phone]

## 2016-01-28 NOTE — ED Triage Notes (Signed)
Pt c/o intermittent L arm numbness for a couple of days. Reports L arm pain and coldness in limb. Bilateral arms are same warmth. Pulse strong in L arm

## 2016-01-28 NOTE — ED Provider Notes (Signed)
7:11 AM Handoff from Muthersbaugh PA-C at shift change.   Pt with arm pain, now improved. Labs pending. These were reviewed and are negative.   Unclear etiology of symptoms identified, however no dangerous or emergent condition suspected.   Patient updated on results. Will give primary care referral. Encouraged return with severe persistent pain, color change, weakness, numbness.   On exam: Full range of motion of left upper extremity at all joints without pain. Forearm compartments are soft. 2+ radial pulse with normal capillary refill. No sensation deficit.  BP 115/90   Pulse 101   Temp 98.1 F (36.7 C) (Oral)   Resp 25   LMP 01/24/2016   SpO2 99%       Renne CriglerJoshua Karlie Aung, PA-C 01/28/16 [id number]    Zadie Rhineonald Wickline, MD 01/28/16 [phone]

## 2016-02-24 ENCOUNTER — Encounter: Payer: Self-pay | Admitting: Physician Assistant

## 2016-02-24 ENCOUNTER — Ambulatory Visit: Payer: Self-pay | Attending: Internal Medicine | Admitting: Physician Assistant

## 2016-02-24 VITALS — BP 133/91 | HR 95 | Temp 98.4°F | Resp 16 | Wt 167.0 lb

## 2016-02-24 DIAGNOSIS — R202 Paresthesia of skin: Secondary | ICD-10-CM

## 2016-02-24 DIAGNOSIS — Z716 Tobacco abuse counseling: Secondary | ICD-10-CM | POA: Insufficient documentation

## 2016-02-24 DIAGNOSIS — R030 Elevated blood-pressure reading, without diagnosis of hypertension: Secondary | ICD-10-CM

## 2016-02-24 DIAGNOSIS — R42 Dizziness and giddiness: Secondary | ICD-10-CM | POA: Insufficient documentation

## 2016-02-24 DIAGNOSIS — F319 Bipolar disorder, unspecified: Secondary | ICD-10-CM | POA: Insufficient documentation

## 2016-02-24 DIAGNOSIS — Z888 Allergy status to other drugs, medicaments and biological substances status: Secondary | ICD-10-CM | POA: Insufficient documentation

## 2016-02-24 DIAGNOSIS — R52 Pain, unspecified: Secondary | ICD-10-CM

## 2016-02-24 DIAGNOSIS — F4310 Post-traumatic stress disorder, unspecified: Secondary | ICD-10-CM | POA: Insufficient documentation

## 2016-02-24 DIAGNOSIS — R739 Hyperglycemia, unspecified: Secondary | ICD-10-CM

## 2016-02-24 DIAGNOSIS — F17210 Nicotine dependence, cigarettes, uncomplicated: Secondary | ICD-10-CM | POA: Insufficient documentation

## 2016-02-24 DIAGNOSIS — F411 Generalized anxiety disorder: Secondary | ICD-10-CM

## 2016-02-24 LAB — POCT GLYCOSYLATED HEMOGLOBIN (HGB A1C): HEMOGLOBIN A1C: 5.2

## 2016-02-24 LAB — TSH: TSH: 1.29 mIU/L

## 2016-02-24 NOTE — Progress Notes (Signed)
[name], is a 35 y.o. female  ZOX:[id number]  [id number]  DOB - 11-Jun-1980  Subjective:  Chief Complaint and HPI: [name] is a 35 y.o. female here today to establish care and for a follow up visit after being seen in the ED for L arm pain. She had a negative cardiac work-up.  She presents today with a few concerns.  She c/o intermittent dizziness X 1 year. +heavy smoker(>1ppd).  She c/o body aches including hand, wrist, and knee pain intermittently. She admits to PTSD and heavy anxiety.  She quit drinking alcohol almost 1 year ago and quit smoking marijuana about 2 months ago.  She has been to some NA/AA but is not actively engaged currently. The dizziness episodes come and go.  Usually when it occurs, she admits to feeling anxious and the episodes worsen.  She even says she has passed out before when this has happened.  She denies CP or palpitations. [facility], but she hasn't been seen there in a long time.  No FH early sudden death.  Recent ED notes/labs/EKG reviewed.   ROS:   Constitutional:  No f/c, No night sweats, No unexplained weight loss. EENT:  No vision changes, No blurry vision, No hearing changes. No mouth, throat, or ear problems.  Respiratory: No cough, some SOB/wheezing Cardiac: No CP, no palpitations GI:  No abd pain, No N/V/D. GU: No Urinary s/sx Musculoskeletal: + joint pain; paresthesias in hands and legs at times Neuro: No headache, + dizziness, no motor weakness.  Skin: No rash Endocrine:  No polydipsia. No polyuria.  Psych: Denies SI/HI  No problems updated.  ALLERGIES: Allergies  Allergen Reactions  . Haldol [Haloperidol Lactate] Other (See Comments)    Makes whole body stiff    PAST MEDICAL HISTORY: Past Medical History:  Diagnosis Date  . Anxiety   . Benzodiazepine abuse   . Bipolar 1 disorder (HCC)   . Depression   . Drug abuse   . ETOH abuse   . Polysubstance abuse     MEDICATIONS AT HOME: Prior  to Admission medications   Medication Sig Start Date End Date Taking? Authorizing Provider  diphenhydrAMINE (BENADRYL) 25 mg capsule Take 25 mg by mouth every 6 (six) hours as needed for sleep.   Yes Historical Provider, MD  omeprazole (PRILOSEC) 20 MG capsule Take 20 mg by mouth daily.   Yes Historical Provider, MD  ondansetron (ZOFRAN) 4 MG tablet Take 1 tablet (4 mg total) by mouth every 6 (six) hours. Patient not taking: Reported on 02/24/2016 07/01/15   Bethel BornKelly Marie Gekas, PA-C     Objective:  EXAM:   Vitals:   02/24/16 1043  BP: (!) 133/91  Pulse: 95  Resp: 16  Temp: 98.4 F (36.9 C)  TempSrc: Oral  SpO2: 99%  Weight: 167 lb (75.8 kg)    General appearance : A&OX3. NAD. Non-toxic-appearing HEENT: Atraumatic and Normocephalic.  PERRLA. EOM intact.  TM clear B. Mouth-MMM, post pharynx WNL w/o erythema, No PND. Neck: supple, no JVD. No cervical lymphadenopathy. No thyromegaly Chest/Lungs:  Breathing-non-labored, Good air entry bilaterally, breath fairly sounds normal without rales, rhonchi. There is minimal wheezing occasionally CVS: S1 S2 regular, no murmurs, gallops, rubs  Abdomen: Bowel sounds present, Non tender and not distended with no gaurding, rigidity or rebound. Extremities: Bilateral Lower Ext shows no edema, both legs are warm to touch with = pulse throughout Neurology:  CN II-XII grossly intact, Non focal.   Psych:  TP relatively linear.  J/I WNL. Pressured speech. Appropriate eye contact and affect.  Skin:  No Rash  Data Review No results found for: HGBA1C=5.2   Assessment & Plan   1. Generalized anxiety disorder Sober from alcohol almost 1 year and from marijuana about 2 months Encouraged re-engagement in recovery community-AA/NA/12 steps.  She would really benefit from this.  Many of her issues may be related to untreated anxiety and not being actively engaged in recovery. - Vitamin D, 25-hydroxy - Ambulatory referral to Psychiatry  2. Paresthesias -  TSH  3. Body aches - Sedimentation Rate  4. Elevated BP without diagnosis of hypertension Check BP 3X/week and record and bring to next visit  5. Hyperglycemia With normal A1C today - POCT glycosylated hemoglobin (Hb A1C)  6. Heavy smoker-smoking cessation advised.  Discussed using patches or gum/alternatives to cut down/also using 12 step recovery with this addiction.  Spent >45 mins face to face counseling on all of the above  Patient have been counseled extensively about nutrition and exercise  Return in about 4 weeks (around 03/23/2016) for establish care and f/up elevated BP.  The patient was given clear instructions to go to ER or return to medical center if symptoms don't improve, worsen or new problems develop. The patient verbalized understanding. The patient was told to call to get lab results if they haven't heard anything in the next week.     Georgian CoAngela Klinton Candelas, PA-C [facility] [place], [place] [phone]   02/24/2016, 11:29 AMPatient ID: [name], female   DOB: 02-10-1981, 35 y.o.   MRN: [id number]

## 2016-02-24 NOTE — Patient Instructions (Signed)
Check BP 3 times weekly and record and bring to next visit

## 2016-02-24 NOTE — Progress Notes (Signed)
Pt is in the office today for ED follow up Pt states she is not in any pain Pt states she noticed she has been thirsty a lot Pt states Tuesday night her hand were swelling  Pt states she knows these symptoms are stressed related and she has servere anxiety Pt states she wakes up a lot shaking Pt states she has been having dizzy issues Pt states she has passed out once

## 2016-02-25 LAB — SEDIMENTATION RATE: Sed Rate: 1 mm/hr (ref 0–20)

## 2016-02-25 LAB — VITAMIN D 25 HYDROXY (VIT D DEFICIENCY, FRACTURES): VIT D 25 HYDROXY: 24 ng/mL — AB (ref 30–100)

## 2016-03-01 ENCOUNTER — Telehealth: Payer: Self-pay

## 2016-03-01 NOTE — Telephone Encounter (Signed)
Contacted pt to go over lab results pt is aware of results and doesn't have any questions or concerns 

## 2016-03-02 ENCOUNTER — Emergency Department (HOSPITAL_COMMUNITY)
Admission: EM | Admit: 2016-03-02 | Discharge: 2016-03-04 | Disposition: A | Discharge status: Other Acute Inpatient Hospital | Payer: Self-pay

## 2016-03-02 ENCOUNTER — Encounter (HOSPITAL_COMMUNITY): Payer: Self-pay

## 2016-03-02 DIAGNOSIS — Y929 Unspecified place or not applicable: Secondary | ICD-10-CM | POA: Insufficient documentation

## 2016-03-02 DIAGNOSIS — F1910 Other psychoactive substance abuse, uncomplicated: Secondary | ICD-10-CM | POA: Diagnosis present

## 2016-03-02 DIAGNOSIS — T450X2A Poisoning by antiallergic and antiemetic drugs, intentional self-harm, initial encounter: Secondary | ICD-10-CM | POA: Insufficient documentation

## 2016-03-02 DIAGNOSIS — T402X2A Poisoning by other opioids, intentional self-harm, initial encounter: Secondary | ICD-10-CM | POA: Insufficient documentation

## 2016-03-02 DIAGNOSIS — Y999 Unspecified external cause status: Secondary | ICD-10-CM | POA: Insufficient documentation

## 2016-03-02 DIAGNOSIS — Y939 Activity, unspecified: Secondary | ICD-10-CM | POA: Insufficient documentation

## 2016-03-02 DIAGNOSIS — T424X2A Poisoning by benzodiazepines, intentional self-harm, initial encounter: Secondary | ICD-10-CM | POA: Insufficient documentation

## 2016-03-02 DIAGNOSIS — T43022A Poisoning by tetracyclic antidepressants, intentional self-harm, initial encounter: Secondary | ICD-10-CM | POA: Insufficient documentation

## 2016-03-02 DIAGNOSIS — F17210 Nicotine dependence, cigarettes, uncomplicated: Secondary | ICD-10-CM | POA: Insufficient documentation

## 2016-03-02 LAB — RAPID URINE DRUG SCREEN, HOSP PERFORMED
Amphetamines: NOT DETECTED
BARBITURATES: NOT DETECTED
Benzodiazepines: POSITIVE — AB
Cocaine: NOT DETECTED
Opiates: NOT DETECTED
Tetrahydrocannabinol: NOT DETECTED

## 2016-03-02 LAB — COMPREHENSIVE METABOLIC PANEL
ALBUMIN: 4.5 g/dL (ref 3.5–5.0)
ALK PHOS: 57 U/L (ref 38–126)
ALT: 18 U/L (ref 14–54)
AST: 18 U/L (ref 15–41)
Anion gap: 8 (ref 5–15)
BILIRUBIN TOTAL: 0.2 mg/dL — AB (ref 0.3–1.2)
BUN: 13 mg/dL (ref 6–20)
CALCIUM: 9.7 mg/dL (ref 8.9–10.3)
CO2: 23 mmol/L (ref 22–32)
CREATININE: 0.69 mg/dL (ref 0.44–1.00)
Chloride: 109 mmol/L (ref 101–111)
GFR calc Af Amer: >60 mL/min (ref >60)
GFR calc non Af Amer: >60 mL/min (ref >60)
GLUCOSE: 73 mg/dL (ref 65–99)
Potassium: 4 mmol/L (ref 3.5–5.1)
Sodium: 140 mmol/L (ref 135–145)
TOTAL PROTEIN: 7.5 g/dL (ref 6.5–8.1)

## 2016-03-02 LAB — CBC WITH DIFFERENTIAL/PLATELET
BASOS ABS: 0 10*3/uL (ref 0.0–0.1)
BASOS PCT: 0 %
Eosinophils Absolute: 0 10*3/uL (ref 0.0–0.7)
Eosinophils Relative: 1 %
HEMATOCRIT: 38.7 % (ref 36.0–46.0)
HEMOGLOBIN: 13.1 g/dL (ref 12.0–15.0)
LYMPHS ABS: 2.6 10*3/uL (ref 0.7–4.0)
LYMPHS PCT: 37 %
MCH: 33.9 pg (ref 26.0–34.0)
MCHC: 33.9 g/dL (ref 30.0–36.0)
MCV: 100 fL (ref 78.0–100.0)
Monocytes Absolute: 0.4 10*3/uL (ref 0.1–1.0)
Monocytes Relative: 6 %
Neutro Abs: 4 10*3/uL (ref 1.7–7.7)
Neutrophils Relative %: 56 %
Platelets: 265 10*3/uL (ref 150–400)
RBC: 3.87 MIL/uL (ref 3.87–5.11)
RDW: 12.4 % (ref 11.5–15.5)
WBC: 7.1 10*3/uL (ref 4.0–10.5)

## 2016-03-02 LAB — I-STAT BETA HCG BLOOD, ED (MC, WL, AP ONLY)

## 2016-03-02 LAB — ACETAMINOPHEN LEVEL: Acetaminophen (Tylenol), Serum: <10 ug/mL — ABNORMAL LOW (ref 10–30)

## 2016-03-02 LAB — ETHANOL: Alcohol, Ethyl (B): <5 mg/dL (ref <5)

## 2016-03-02 LAB — SALICYLATE LEVEL

## 2016-03-02 MED ORDER — LORAZEPAM 2 MG/ML IJ SOLN: 2.0000 mg | Freq: Once | INTRAMUSCULAR | Status: DC

## 2016-03-02 MED ORDER — ZIPRASIDONE MESYLATE 20 MG IM SOLR
20.0000 mg | Freq: Once | INTRAMUSCULAR | Status: AC
  Administered 2016-03-02: 20 mg via INTRAMUSCULAR

## 2016-03-02 MED ORDER — LORAZEPAM 2 MG/ML IJ SOLN
2.0000 mg | Freq: Once | INTRAMUSCULAR | Status: AC
  Administered 2016-03-02: 2 mg via INTRAMUSCULAR
  Filled 2016-03-02: qty 1

## 2016-03-02 MED ORDER — ZIPRASIDONE MESYLATE 20 MG IM SOLR
20.0000 mg | Freq: Once | INTRAMUSCULAR | Status: AC | PRN
Start: 2016-03-02 — End: 2016-03-02
  Administered 2016-03-02: 20 mg via INTRAMUSCULAR

## 2016-03-02 NOTE — ED Notes (Addendum)
Pt reports having severe ptsd attacks over the last several weeks. Pt States she has had thoughts of harming herself but does not want to act on it. PT states she took pills today to help with her attack but did not want to hurt herself. Pt states she has a lot going on and has been very stressed. She keeps talking about her "safe zone" at her parents.   Pt admits to taking: 8 "white bar xanax" 7-10 Remeron  4-6 Benadryl 3 5/325 Percocet over entire day.  Pt states she took the medications "over a couple hours". She states "i realize I kinda went overboard. But I did not try to kill myself."
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
## 2016-03-02 NOTE — BH Assessment (Signed)
Tele Assessment Note   [name] is a 35 y.o. female [facility]  She is currently under IVC.  Per report, Pt has a history of bipolar disorder, depressive disorder, PTSD, and polysubstance use disorder.  During assessment, Pt was very guarded and would not answer directly.  Per hospital report, Pt overdosed on 38 tablets of 2mg  Xanax, a handful of Remeron, 4-6 benadryl tablets (25mg ), and 3 percocet (5/325).  Pt told hospital she did so to ease anxiety, to help her sleep, and possibly to hurt herself (although she denied it was a suicide attempt).  Author asked her about the reason for the overdose, and Pt stated, "It's all possible.  The first choice, the second choice, the third choice."  Also per report, Pt stated that she has not slept in almost four days.  To author, Pt stated that her sleep is average.  When asked about the inconsistency, Pt stated that her sleep is average.    In addition to the intentional overdose, Pt endorsed insomnia, irritability, and persistent feelings of worthlessness and despondency.  Pt denied that she is taking any psychotropic medication, and she also denied having a current psychiatrist (this is contrary to her report to the hospital, in which she said she is [facility]).  Pt also reported several psychosocial stressors, including a recent suicide attempt by mother, unemployment, and difficulty in her neighborhood.  Pt denied current substance use (UDS not available at time of assessment).  Pt has a history of heroin, benzo, and marijuana use.  During assessment, Pt was drowsy.  Demeanor was very guarded and suspicious.  Pt may have prevaricated to avoid inpatient placement.  Pt's mood was suspicious and preoccupied, and affect was mood-congruent.  Pt endorsed recent overdose, which she described as "possibly" an attempt to self-harm.  She also endorsed other depressive symptoms and a history of  substance use.  Pt's speech was normal in rate, rhythm, and volume.  Pt's thought processes were slowed, and thought content was goal-oriented.  There was no evidence of delusion or hallucination.  Pt's concentration was fair.  Pt did not respond to memory prompts.  Insight, judgment, and impulse control were deemed poor.  Consulted with Irving BurtonL. Parks, NP who recommended inpatient placement.   States she did this to ease her anxiety but to try to help her sleep as well.  States she was having some thoughts of hurting herself, however but was not trying to kill herself. States upon arrival to the emergency department she realized she "overdid it". States she still has not gone to sleep. States she last slept about 4 days ago. States is not unusual for her stay awake for this amount of time. States she has been eating and drinking regularly. States she has been staying with her mom as she feels safe at her parents house. She does report that she has had some added stress as her mom attempted suicide as well. She denies any alcohol use with the pills. She denies any illicit drug use.  States she [facility] who pres[facility]es her medications.  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 Anxiety   . Benzodiazepine abuse   . Bipolar 1 disorder (HCC)   . Depression   . Drug abuse   . ETOH abuse   . Polysubstance abuse  History reviewed. No pertinent surgical history.  Family History: No family history on file.  Social History:  reports that she has been smoking Cigarettes.  She has a 34.00 pack-year smoking history. She has never used smokeless tobacco. She reports that she drinks about 7.2 oz of alcohol per week . She reports that she uses drugs, including Heroin, Benzodiazepines, Marijuana, and IV, about 7 times per week.  Additional Social History:  Alcohol / Drug  Use Pain Medications: See PTA Prescriptions: See PTA Over the Counter: See PTA  CIWA: CIWA-Ar BP: 102/75 Pulse Rate: 108 COWS:    PATIENT STRENGTHS: (choose at least two) Capable of independent living Communication skills  Allergies:  Allergies  Allergen Reactions  . Haldol [Haloperidol Lactate] Other (See Comments)    Makes whole body stiff    Home Medications:  (Not in a hospital admission)  OB/GYN Status:  Patient's last menstrual period was 02/21/2016.  General Assessment Data Location of Assessment: [facility] ED TTS Assessment: In system Is this a Tele or Face-to-Face Assessment?: Tele Assessment Is this an Initial Assessment or a Re-assessment for this encounter?: Initial Assessment Marital status: Single Is patient pregnant?: No Pregnancy Status: No Living Arrangements: Non-relatives/Friends Can pt return to current living arrangement?: Yes Admission Status: Involuntary Is patient capable of signing voluntary admission?: Yes Referral Source: Other (Unknown -- Pt would not answer how she came to hospital) Insurance type: SP     Crisis Care Plan Living Arrangements: Non-relatives/Friends Name of Psychiatrist: None currently Name of Therapist: None currently  Education Status Is patient currently in school?: No  Risk to self with the past 6 months Suicidal Ideation: No-Not Currently/Within Last 6 Months Has patient been a risk to self within the past 6 months prior to admission? : Other (comment) (Unknown -- Pt would not respond) Suicidal Intent: No-Not Currently/Within Last 6 Months Has patient had any suicidal intent within the past 6 months prior to admission? : Other (comment) (Unknown) Is patient at risk for suicide?: Yes Suicidal Plan?: Yes-Currently Present (Pt overdosed in "attempt to hurt self") Has patient had any suicidal plan within the past 6 months prior to admission? : Other (comment) (Unknown) Specify Current Suicidal Plan: Pt overdosed Access  to Means: Yes What has been your use of drugs/alcohol within the last 12 months?: Per hx, THC, cocaine, benzos (Pt denied current use in over a year) Previous Attempts/Gestures: Yes How many times?: 1 Other Self Harm Risks: drug use Intentional Self Injurious Behavior: None (Pt denied) Family Suicide History: Unknown Recent stressful life event(s): Job Loss, Other (Comment) (insomnia) Persecutory voices/beliefs?: No Depression: Yes Depression Symptoms: Despondent, Feeling worthless/self pity, Feeling angry/irritable, Loss of interest in usual pleasures, Insomnia Substance abuse history and/or treatment for substance abuse?: Yes Suicide prevention information given to non-admitted patients: Not applicable  Risk to Others within the past 6 months Homicidal Ideation: No Does patient have any lifetime risk of violence toward others beyond the six months prior to admission? : No Thoughts of Harm to Others: No Current Homicidal Intent: No Current Homicidal Plan: No Access to Homicidal Means: No History of harm to others?: No Assessment of Violence: None Noted Does patient have access to weapons?: No Criminal Charges Pending?: No Does patient have a court date: No Is patient on probation?: No  Psychosis Hallucinations: None noted Delusions: None noted  Mental Status Report Appearance/Hygiene: In scrubs, Unremarkable Eye Contact: Fair Motor Activity: Unremarkable, Freedom of movement Speech: Argumentative, Other (Comment) (guarded, possibly prevaricating or drowsy) Level of Consciousness:  Irritable, Drowsy Mood: Suspicious, Empty Affect: Sullen, Preoccupied, Other (Comment) (Guarded) Anxiety Level: None Thought Processes: Irrelevant, Coherent Judgement: Impaired Orientation: Person, Place, Time Obsessive Compulsive Thoughts/Behaviors: None  Cognitive Functioning Concentration: Fair Memory: Unable to Assess (Pt withdrawn/guarded) IQ: Average Insight: Poor Impulse Control:  Poor Appetite: Good Sleep: Decreased Total Hours of Sleep: 4 (See notes) Vegetative Symptoms: None  ADLScreening [facility] Assessment Services) Patient's cognitive ability adequate to safely complete daily activities?: Yes Patient able to express need for assistance with ADLs?: Yes Independently performs ADLs?: Yes (appropriate for developmental age)  Prior Inpatient Therapy Prior Inpatient Therapy: Yes Prior Therapy Dates: Multiple Prior Therapy Facilty/Provider(s): Multiple Reason for Treatment: SI  Prior Outpatient Therapy Prior Outpatient Therapy: Yes Prior Therapy Dates: Unknown Prior Therapy Facilty/Provider(s): Unknown Reason for Treatment: SI, depression, bipolar Does patient have an ACCT team?: No Does patient have Intensive In-House Services?  : No Does patient have [facility]? : No Does patient have P4CC services?: No  ADL Screening (condition at time of admission) Patient's cognitive ability adequate to safely complete daily activities?: Yes Is the patient deaf or have difficulty hearing?: No Does the patient have difficulty seeing, even when wearing glasses/contacts?: No Does the patient have difficulty concentrating, remembering, or making decisions?: No Patient able to express need for assistance with ADLs?: Yes Does the patient have difficulty dressing or bathing?: No Independently performs ADLs?: Yes (appropriate for developmental age) Does the patient have difficulty walking or climbing stairs?: No Weakness of Legs: None Weakness of Arms/Hands: None  Home Assistive Devices/Equipment Home Assistive Devices/Equipment: None  Therapy Consults (therapy consults require a physician order) PT Evaluation Needed: No OT Evalulation Needed: No SLP Evaluation Needed: No Abuse/Neglect Assessment (Assessment to be complete while patient is alone) Physical Abuse: Denies Verbal Abuse: Denies Sexual Abuse: Denies Exploitation of patient/patient's resources:  Denies Self-Neglect: Denies Values / Beliefs Cultural Requests During Hospitalization: None Spiritual Requests During Hospitalization: None Consults Spiritual Care Consult Needed: No Social Work Consult Needed: No Merchant navy officerAdvance Directives (For Healthcare) Does Patient Have a Medical Advance Directive?: No    Additional Information 1:1 In Past 12 Months?: No CIRT Risk: No Elopement Risk: No Does patient have medical clearance?: Yes     Disposition:  Disposition Initial Assessment Completed for this Encounter: Yes Disposition of Patient: Inpatient treatment program Type of inpatient treatment program: Adult (Per L. Arville CareParks, NP, Pt meets inpt criteria.)  Earline Mayotteugene T Aldona Bryner 03/02/2016 9:12 AM

## 2016-03-02 NOTE — ED Notes (Signed)
Pt sitting up on the mattress.

## 2016-03-02 NOTE — ED Notes (Signed)
Pt is sitting up on the mattress.
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
## 2016-03-02 NOTE — ED Notes (Addendum)
Security and [employer] called to bedside for standby.  The pt asked if she could make a phone call, this RN informed the pt that she would not be allowed to make any additional phone calls today or have hot beverages due to the pts earlier behavior. This RN offered the pt cold beverages, the pt declined. This RN offered the pt a menu so that she can order something for dinner. This RN also informed the pt that we are waiting for an inpatient bed for her.

## 2016-03-02 NOTE — ED Notes (Signed)
Pt sleeping on the mattress on the floor.

## 2016-03-02 NOTE — ED Notes (Signed)
Millie RN and this RN explained to the pt that she spoke with TTS earlier this morning. The pt said that she didn't do that because she didn't remember. The pt started to become agitated talking about wanting to see her nephew, this RN and Facilities managerMillie RN again explained to the pt that she could not leave and that we are waiting for placement for her.

## 2016-03-02 NOTE — ED Notes (Signed)
Pt highly agitated, keeps coming out of room, cussing at staff and security. Pt willingly took Geodon IM in R arm, administered by Rushie ChestnutBrian Funk RN. Pt continues to come out of room, security remains at bedside

## 2016-03-02 NOTE — ED Notes (Signed)
Pt is sleeping on the mattress on the floor.

## 2016-03-02 NOTE — ED Notes (Addendum)
There was a visitor at bedside, this RN provided the pt and the visitor with the "welcome to Pod C" sheet outlining the rules and visitation hours. The visitor was told that they are welcome to come back at 1230 when it is visiting hours.  This RN explained to the pt that we are waiting for an inpatient bed for her. The pt stated that she didn't understand. This RN explained to the pt that when she talked to behavioral health through the TTS they recommended that she stay as an in patient. The pt stated that she didn't understand who she talked to. This RN explained to the pt that it was the people in the computer screen she talked to.
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
## 2016-03-02 NOTE — ED Notes (Signed)
TTS at bedside. 

## 2016-03-02 NOTE — Progress Notes (Signed)
Spoke with pt. RN Vance PeperLynnze [facility] [place] [facility], will fax IVC papers to [phone] [employer] for referral and disposition. Mcclellan Demarais K. Sherlon HandingHarris, LCAS-A, LPC-A, [facility]  Counselor 03/02/2016 6:04 PM

## 2016-03-02 NOTE — Progress Notes (Signed)
Patient referral was submitted to the following facilities: [facility], [place], [facility], [facility], [facility], [facility], [facility], [address], [address], [facility], [place], [place], [place], [place], [place], [place], [facility], [place], [facility]  Linsi Humann K. Sherlon HandingHarris, LCAS-A, LPC-A, [facility]  Counselor 03/02/2016 6:33 PM

## 2016-03-02 NOTE — ED Notes (Signed)
Pt sleeping on the mattress on the floor.  

## 2016-03-02 NOTE — ED Triage Notes (Signed)
0300- family called EMS stating pt took unknown amount of xanax and oxycodone. Pt was a&ox 4 and refused transport. Pt denied SI.   0500-PT took unknown amount of xanax, approx. 20 Remeron, and unknown amount of benadryl at 0500. PT still stating she is not SI.   Pt has some slurred speech.

## 2016-03-02 NOTE — ED Notes (Signed)
Pt sitting up on the mattress. [employer] at bedside. Handcuffs still lon.

## 2016-03-03 MED ORDER — DIPHENHYDRAMINE HCL 25 MG PO CAPS
25.0000 mg | ORAL_CAPSULE | Freq: Four times a day (QID) | ORAL | Status: DC | PRN
  Administered 2016-03-03: 25 mg via ORAL
  Administered 2016-03-04 (×2): 50 mg via ORAL
  Filled 2016-03-03: qty 2
  Filled 2016-03-03: qty 1
  Filled 2016-03-03 (×2): qty 2

## 2016-03-03 MED ORDER — IBUPROFEN 400 MG PO TABS
600.0000 mg | ORAL_TABLET | Freq: Three times a day (TID) | ORAL | Status: DC | PRN
  Administered 2016-03-03: 600 mg via ORAL
  Filled 2016-03-03: qty 1

## 2016-03-03 MED ORDER — NICOTINE 14 MG/24HR TD PT24
14.0000 mg | MEDICATED_PATCH | Freq: Once | TRANSDERMAL | Status: AC
  Administered 2016-03-03: 14 mg via TRANSDERMAL
  Filled 2016-03-03: qty 1

## 2016-03-03 MED ORDER — OMEPRAZOLE MAGNESIUM 20 MG PO TBEC: 20.0000 mg | DELAYED_RELEASE_TABLET | Freq: Every day | ORAL | Status: DC

## 2016-03-03 MED ORDER — PANTOPRAZOLE SODIUM 40 MG PO TBEC
40.0000 mg | DELAYED_RELEASE_TABLET | Freq: Every day | ORAL | Status: DC
  Administered 2016-03-03 – 2016-03-04 (×2): 40 mg via ORAL
  Filled 2016-03-03 (×2): qty 1

## 2016-03-03 MED ORDER — STERILE WATER FOR INJECTION IJ SOLN
INTRAMUSCULAR | Status: AC
  Filled 2016-03-03: qty 10

## 2016-03-03 MED ORDER — LORAZEPAM 1 MG PO TABS
1.0000 mg | ORAL_TABLET | Freq: Once | ORAL | Status: AC
  Administered 2016-03-03: 1 mg via ORAL
  Filled 2016-03-03: qty 1

## 2016-03-03 MED ORDER — LORAZEPAM 2 MG/ML IJ SOLN
2.0000 mg | Freq: Once | INTRAMUSCULAR | Status: AC
  Administered 2016-03-03: 2 mg via INTRAMUSCULAR
  Filled 2016-03-03: qty 1

## 2016-03-03 MED ORDER — ACETAMINOPHEN 325 MG PO TABS
650.0000 mg | ORAL_TABLET | ORAL | Status: DC | PRN
  Administered 2016-03-03: 650 mg via ORAL
  Filled 2016-03-03: qty 2

## 2016-03-03 MED ORDER — ZIPRASIDONE MESYLATE 20 MG IM SOLR
20.0000 mg | Freq: Once | INTRAMUSCULAR | Status: AC
  Administered 2016-03-03: 20 mg via INTRAMUSCULAR
  Filled 2016-03-03: qty 20

## 2016-03-03 MED ORDER — CALCIUM CARBONATE ANTACID 500 MG PO CHEW
1.0000 | CHEWABLE_TABLET | Freq: Once | ORAL | Status: AC
  Administered 2016-03-03: 200 mg via ORAL
  Filled 2016-03-03: qty 1

## 2016-03-03 MED ORDER — DIPHENHYDRAMINE HCL 25 MG PO CAPS
25.0000 mg | ORAL_CAPSULE | Freq: Once | ORAL | Status: AC
  Administered 2016-03-03: 25 mg via ORAL
  Filled 2016-03-03: qty 1

## 2016-03-03 NOTE — ED Notes (Signed)
Pt. Roommate visiting at this time. Pt. Calm and cooperative.

## 2016-03-03 NOTE — ED Notes (Addendum)
Pt still reports not being able to sleep, pt calm and cooperative. Given 1mg  Ativan per Bulver[place]iffany PA

## 2016-03-03 NOTE — ED Notes (Signed)
Lunch tray ordered; regular diet, no sharps 

## 2016-03-03 NOTE — ED Notes (Signed)
Dinner tray ordered; regular diet, no sharps 

## 2016-03-03 NOTE — ED Notes (Signed)
Patient currently sleeping on mattress on floor in room. Sitter present.

## 2016-03-03 NOTE — ED Notes (Signed)
Patient using telephone. Pt. Calm at this time.

## 2016-03-03 NOTE — ED Notes (Signed)
Pt. Cell phone released to roommate Dimitra Tsiolkas at this time by patient request.

## 2016-03-03 NOTE — ED Notes (Signed)
Chaplain at bedside talking with patient. 

## 2016-03-03 NOTE — ED Notes (Signed)
Pt awake and standing at door, verbal order for Benadryl PO

## 2016-03-03 NOTE — ED Notes (Signed)
Visitor at bedside.

## 2016-03-03 NOTE — ED Notes (Signed)
Pt given her callight per her request. Pt and RN made an agreement that if pt cooperates that she will be allowed to sleep on an actual bed verses a mattress on the floor.  Pt requesting TUMS to help with her stomach.

## 2016-03-03 NOTE — ED Notes (Signed)
Pt. Upset and walking around the halls. Pt. Asked to return to her room. Pt. Wanting to be reassessed by TTS because she sts she was under the influence during her assessment yesterday. EDP made aware. [facility] notified and TTS machine placed in room.

## 2016-03-03 NOTE — ED Notes (Signed)
Patient using telephone  

## 2016-03-03 NOTE — Progress Notes (Signed)
   03/03/16 1630  Clinical Encounter Type  Visited With Patient  Visit Type ED  Spiritual Encounters  Spiritual Needs Sacred text;Emotional  Stress Factors  Patient Stress Factors Major life changes  Gave introduction to Pt. Pt. Had concerns about her relationship with God. Discussed helpful passages from scripture. No other concerns at this time.

## 2016-03-03 NOTE — Progress Notes (Signed)
Followed up on inpatient referral efforts for pt. [facility] [place] [place] as there is expected bed availability later today.  [facility] due to no [facility].  [facility] advised no adult beds today but will call if beds open.   Will continue following.  Ilean SkillMeghan Earlee Herald, MSW, LCSW Clinical Social Work, Disposition  03/03/2016 [phone]

## 2016-03-03 NOTE — Consult Note (Signed)
Telepsych Consultation   Reason for Consult:  Suicidal ideation with overdose Referring Physician:  EDP Patient Identification: [name] MRN:  [id number] Principal Diagnosis: <principal problem not specified> Diagnosis:   Patient Active Problem List   Diagnosis Date Noted  . Substance induced mood disorder ([place]) [F19.94] 06/28/2015  . Polysubstance dependence including opioid type drug, episodic abuse ([place]) [F11.20, F19.20] 06/03/2015  . Polysubstance abuse [F19.10] 11/01/2013  . Benzodiazepine dependence ([place]) [F13.20] 11/01/2013  . Bipolar I disorder, most recent episode (or current) manic, unspecified [F31.10] 02/25/2013  . Substance abuse/dependence [IMO0002] 02/21/2013    Total Time spent with patient: 30 minutes  Subjective:   [name] is a 35 y.o. female patient admitted with suicidal ideation. Pt stated she   HPI: Per tele assessment note on chart written by Esmeralda Links, [facility] Counselor: [name] is a 35 y.o. female who presented to [facility] with complaint of overdose.  She is currently under IVC.  Per report, Pt has a history of bipolar disorder, depressive disorder, PTSD, and polysubstance use disorder.  During assessment, Pt was very guarded and would not answer directly.  Per hospital report, Pt overdosed on 38 tablets of 65m Xanax, a handful of Remeron, 4-6 benadryl tablets (273m, and 3 percocet (5/325). Pt told hospital she did so to ease anxiety, to help her sleep, and possibly to hurt herself (although she denied it was a suicide attempt).  Author asked her about the reason for the overdose, and Pt stated, "It's all possible.  The first choice, the second choice, the third choice."  Also per report, Pt stated that she has not slept in almost four days.  To author, Pt stated that her sleep is average.  When asked about the inconsistency, Pt stated that her sleep is average.    In addition to the intentional overdose, Pt endorsed insomnia,  irritability, and persistent feelings of worthlessness and despondency.  Pt denied that she is taking any psychotropic medication, and she also denied having a current psychiatrist (this is contrary to her report to the hospital, in which she said [facility]  Pt also reported several psychosocial stressors, including a recent suicide attempt by mother, unemployment, and difficulty in her neighborhood.  Pt denied current substance use (UDS not available at time of assessment).  Pt has a history of heroin, benzo, and marijuana use.  During assessment, Pt was drowsy.  Demeanor was very guarded and suspicious.  Pt may have prevaricated to avoid inpatient placement.  Pt's mood was suspicious and preoccupied, and affect was mood-congruent.  Pt endorsed recent overdose, which she described as "possibly" an attempt to self-harm.  She also endorsed other depressive symptoms and a history of substance use.  Pt's speech was normal in rate, rhythm, and volume.  Pt's thought processes were slowed, and thought content was goal-oriented.  There was no evidence of delusion or hallucination.  Pt's concentration was fair.  Pt did not respond to memory prompts.  Insight, judgment, and impulse control were deemed poor.  States she did this to ease her anxiety but to try to help her sleep as well. States she was having some thoughts of hurting herself, however but was not trying to kill herself. States upon arrival to the emergency department she realized she "overdid it". States she still has not gone to sleep. States she last slept about 4 days ago. States is not unusual for her stay awake for this amount of time. States she  has been eating and drinking regularly. States she has been staying with her mom as she feels safe at her parents house. She does report that she has had some added stress as her mom attempted suicide as well. She denies any alcohol use with the pills. She denies any  illicit drug use. States she does see a mental health specialist at [facility] who prescribes her medications.  Today during tele psych consult:  [name] is a 35 y.o. female who presented to [facility] with complaint of overdose.  Pt denies homicidal ideation, denies auditory/visual hallucinations and does not appear to be responding to internal stimuli. Pt was calm and cooperative, alert & oriented x 3, dressed in paper scrubs, sitting in a chair that she pulled very close to the camera. Pt's speech was pressured. Pt stated she took one xanax and some benadryl, Remeron and percocet but was not sure how much of each she took. Pt stated she has been arguing with her mother a lot lately and her mother put the idea of suicide in pt's head because she talks about it all the time. Pt states "I was not trying to hurt myself, sure I had the thoughts but they were thoughts. I have been sober for 1 year. My mother has been telling me about her marital problems and on my birthday she completely ruined my day." Pt stated she has Remeron prescribed from [place] for anxiety, her percocet came from a dentist, the xanax she got from a friend, and the benadryl she takes for anxiety. Pt then stated again that she has been sober for a year. Pt states "I only get my meds from [place], I don't have a therapist or psychiatrist but am willing to see one but it hasn't helped my mother so I am not sure it will help me." Pt has been angry and demanding of staff since being admitted and had to be IVC'd. Pt has been yelling, spitting, and cussing at staff members and had to be subdued by security. Pt was given Geodon and Ativan this AM for her aggressive behavior. Pt stated she does not remember speaking to [facility] Counselor yesterday either. 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, so I am going to work on  that."     Past Psychiatric History: Polysubstance abuse, Bipolar disorder,   Risk to Self: Suicidal Ideation: No-Not Currently/Within Last 6 Months Suicidal Intent: No-Not Currently/Within Last 6 Months Is patient at risk for suicide?: Yes Suicidal Plan?: Yes-Currently Present (Pt overdosed in "attempt to hurt self") Specify Current Suicidal Plan: Pt overdosed Access to Means: Yes What has been your use of drugs/alcohol within the last 12 months?: Per hx, THC, cocaine, benzos (Pt denied current use in over a year) How many times?: 1 Other Self Harm Risks: drug use Intentional Self Injurious Behavior: None (Pt denied) Risk to Others: Homicidal Ideation: No Thoughts of Harm to Others: No Current Homicidal Intent: No Current Homicidal Plan: No Access to Homicidal Means: No History of harm to others?: No Assessment of Violence: None Noted Does patient have access to weapons?: No Criminal Charges Pending?: No Does patient have a court date: No Prior Inpatient Therapy: Prior Inpatient Therapy: Yes Prior Therapy Dates: Multiple Prior Therapy Facilty/Provider(s): Multiple Reason for Treatment: SI Prior Outpatient Therapy: Prior Outpatient Therapy: Yes Prior Therapy Dates: Unknown  Prior Therapy Facilty/Provider(s): Unknown Reason for Treatment: SI, depression, bipolar Does patient have an ACCT team?: No Does patient have Intensive In-House Services?  : No Does patient have [facility] services? : No Does patient have P4CC services?: No  Past Medical History:  Past Medical History:  Diagnosis Date  . Anxiety   . Benzodiazepine abuse   . Bipolar 1 disorder ([place])   . Depression   . Drug abuse   . ETOH abuse   . Polysubstance abuse    History reviewed. No pertinent surgical history. Family History: No family history on file. Family Psychiatric  History: Unknown Social History:  History  Alcohol Use  . 7.2 oz/week  . 12 Cans of beer per week    Comment: Per report; Pt denied  current use; BAC clear     History  Drug Use  . Frequency: 7.0 times per week  . Types: Heroin, Benzodiazepines, Marijuana, IV    Comment: Per previous report; Pt denied use in over a year; UDS not available    Social History   Social History  . Marital status: Single    Spouse name: N/A  . Number of children: N/A  . Years of education: N/A   Social History Main Topics  . Smoking status: Current Every Day Smoker    Packs/day: 2.00    Years: 17.00    Types: Cigarettes  . Smokeless tobacco: Never Used  . Alcohol use 7.2 oz/week    12 Cans of beer per week     Comment: Per report; Pt denied current use; BAC clear  . Drug use:     Frequency: 7.0 times per week    Types: Heroin, Benzodiazepines, Marijuana, IV     Comment: Per previous report; Pt denied use in over a year; UDS not available  . Sexual activity: No   Other Topics Concern  . None   Social History Narrative  . None   Additional Social History:    Allergies:   Allergies  Allergen Reactions  . Haldol [Haloperidol Lactate] Other (See Comments)    Makes whole body stiff    Labs:  Results for orders placed or performed during the hospital encounter of 03/02/16 (from the past 48 hour(s))  CBC with Differential     Status: None   Collection Time: 03/02/16  6:38 AM  Result Value Ref Range   WBC 7.1 4.0 - 10.5 K/uL   RBC 3.87 3.87 - 5.11 MIL/uL   Hemoglobin 13.1 12.0 - 15.0 g/dL   HCT 38.7 36.0 - 46.0 %   MCV 100.0 78.0 - 100.0 fL   MCH 33.9 26.0 - 34.0 pg   MCHC 33.9 30.0 - 36.0 g/dL   RDW 12.4 11.5 - 15.5 %   Platelets 265 150 - 400 K/uL   Neutrophils Relative % 56 %   Neutro Abs 4.0 1.7 - 7.7 K/uL   Lymphocytes Relative 37 %   Lymphs Abs 2.6 0.7 - 4.0 K/uL   Monocytes Relative 6 %   Monocytes Absolute 0.4 0.1 - 1.0 K/uL   Eosinophils Relative 1 %   Eosinophils Absolute 0.0 0.0 - 0.7 K/uL   Basophils Relative 0 %   Basophils Absolute 0.0 0.0 - 0.1 K/uL  Comprehensive metabolic panel     Status:  Abnormal   Collection Time: 03/02/16  6:38 AM  Result Value Ref Range   Sodium 140 135 - 145 mmol/L   Potassium 4.0 3.5 - 5.1 mmol/L   Chloride 109 101 -  111 mmol/L   CO2 23 22 - 32 mmol/L   Glucose, Bld 73 65 - 99 mg/dL   BUN 13 6 - 20 mg/dL   Creatinine, Ser 0.69 0.44 - 1.00 mg/dL   Calcium 9.7 8.9 - 10.3 mg/dL   Total Protein 7.5 6.5 - 8.1 g/dL   Albumin 4.5 3.5 - 5.0 g/dL   AST 18 15 - 41 U/L   ALT 18 14 - 54 U/L   Alkaline Phosphatase 57 38 - 126 U/L   Total Bilirubin 0.2 (L) 0.3 - 1.2 mg/dL   GFR calc non Af Amer >60 >60 mL/min   GFR calc Af Amer >60 >60 mL/min    Comment: (NOTE) The eGFR has been calculated using the CKD EPI equation. This calculation has not been validated in all clinical situations. eGFR's persistently <60 mL/min signify possible Chronic Kidney Disease.    Anion gap 8 5 - 15  Ethanol     Status: None   Collection Time: 03/02/16  6:38 AM  Result Value Ref Range   Alcohol, Ethyl (B) <5 <5 mg/dL    Comment:        LOWEST DETECTABLE LIMIT FOR SERUM ALCOHOL IS 5 mg/dL FOR MEDICAL PURPOSES ONLY   Salicylate level     Status: None   Collection Time: 03/02/16  6:38 AM  Result Value Ref Range   Salicylate Lvl <0.5 2.8 - 30.0 mg/dL  Acetaminophen level     Status: Abnormal   Collection Time: 03/02/16  6:38 AM  Result Value Ref Range   Acetaminophen (Tylenol), Serum <10 (L) 10 - 30 ug/mL    Comment:        THERAPEUTIC CONCENTRATIONS VARY SIGNIFICANTLY. A RANGE OF 10-30 ug/mL MAY BE AN EFFECTIVE CONCENTRATION FOR MANY PATIENTS. HOWEVER, SOME ARE BEST TREATED AT CONCENTRATIONS OUTSIDE THIS RANGE. ACETAMINOPHEN CONCENTRATIONS >150 ug/mL AT 4 HOURS AFTER INGESTION AND >50 ug/mL AT 12 HOURS AFTER INGESTION ARE OFTEN ASSOCIATED WITH TOXIC REACTIONS.   I-Stat Beta hCG blood, ED ([facility], [facility], [facility] only)     Status: None   Collection Time: 03/02/16  6:55 AM  Result Value Ref Range   I-stat hCG, quantitative <5.0 <5 mIU/mL   Comment 3            Comment:    GEST. AGE      CONC.  (mIU/mL)   <=1 WEEK        5 - 50     2 WEEKS       50 - 500     3 WEEKS       100 - 10,000     4 WEEKS     1,000 - 30,000        FEMALE AND NON-PREGNANT FEMALE:     LESS THAN 5 mIU/mL   Rapid urine drug screen (hospital performed)     Status: Abnormal   Collection Time: 03/02/16  7:53 AM  Result Value Ref Range   Opiates NONE DETECTED NONE DETECTED   Cocaine NONE DETECTED NONE DETECTED   Benzodiazepines POSITIVE (A) NONE DETECTED   Amphetamines NONE DETECTED NONE DETECTED   Tetrahydrocannabinol NONE DETECTED NONE DETECTED   Barbiturates NONE DETECTED NONE DETECTED    Comment:        DRUG SCREEN FOR MEDICAL PURPOSES ONLY.  IF CONFIRMATION IS NEEDED FOR ANY PURPOSE, NOTIFY LAB WITHIN 5 DAYS.        LOWEST DETECTABLE LIMITS FOR URINE DRUG SCREEN Drug Class  Cutoff (ng/mL) Amphetamine      1000 Barbiturate      200 Benzodiazepine   641 Tricyclics       583 Opiates          300 Cocaine          300 THC              50     Current Facility-Administered Medications  Medication Dose Route Frequency Provider Last Rate Last Dose  . sterile water (preservative free) injection            Current Outpatient Prescriptions  Medication Sig Dispense Refill  . amoxicillin (AMOXIL) 500 MG capsule Take 500 mg by mouth 4 (four) times daily.    . Aspirin-Acetaminophen-Caffeine (GOODY HEADACHE PO) Take 1-2 packets by mouth every 6 (six) hours as needed (pain/ headache).    . diphenhydrAMINE (BENADRYL) 25 mg capsule Take 25-50 mg by mouth every 6 (six) hours as needed for sleep (anxiety).     Marland Kitchen HYDROcodone-acetaminophen (NORCO/VICODIN) 5-325 MG tablet Take 1 tablet by mouth every 6 (six) hours as needed for moderate pain.    . Mirtazapine (REMERON PO) Take by mouth.    Marland Kitchen omeprazole (PRILOSEC OTC) 20 MG tablet Take 20 mg by mouth daily.    . Oxycodone-Acetaminophen (PERCOCET PO) Take by mouth.    . ondansetron (ZOFRAN) 4 MG tablet Take 1 tablet (4 mg total) by mouth  every 6 (six) hours. (Patient not taking: Reported on 03/02/2016) 12 tablet 0    Musculoskeletal: Unable to assess: camera  Psychiatric Specialty Exam: Physical Exam  Review of Systems  Psychiatric/Behavioral: Positive for depression, substance abuse and suicidal ideas. Negative for hallucinations and memory loss. The patient is not nervous/anxious and does not have insomnia.   All other systems reviewed and are negative.   Blood pressure 118/65, pulse 107, temperature 98.6 F (37 C), temperature source Oral, resp. rate 16, height 5' 2" (1.575 m), weight 74.8 kg (165 lb), last menstrual period 02/21/2016, SpO2 100 %.Body mass index is 30.18 kg/m.  General Appearance: Casual and Fairly Groomed  Eye Contact:  Good  Speech:  Clear and Coherent and Pressured  Volume:  Normal  Mood:  Angry and Anxious  Affect:  Congruent  Thought Process:  Coherent and Goal Directed  Orientation:  Full (Time, Place, and Person)  Thought Content:  Logical  Suicidal Thoughts:  Yes.  without intent/plan  Homicidal Thoughts:  No  Memory:  Immediate;   Fair Recent;   Fair Remote;   Fair  Judgement:  Fair  Insight:  Fair  Psychomotor Activity:  Increased  Concentration:  Concentration: Good and Attention Span: Good  Recall:  [employer] of Knowledge:  Fair  Language:  Good  Akathisia:  No  Handed:  Right  AIMS (if indicated):     Assets:  Chief Executive Officer Physical Health Transportation  ADL's:  Intact  Cognition:  WNL  Sleep:        Treatment Plan Summary: Daily contact with patient to assess and evaluate symptoms and progress in treatment and Medication management    Disposition: Recommend psychiatric Inpatient admission when medically cleared.  Ethelene Hal, NP 03/03/2016 10:13 AM

## 2016-03-03 NOTE — ED Notes (Signed)
Pt. Showering at this time.  

## 2016-03-03 NOTE — ED Notes (Signed)
Pt. Out in hallway yelling at security and upset she is in here. [employer] asking patient to get back in room. Pt. Pulling zip ties off of cabinets. EDP made aware.

## 2016-03-03 NOTE — ED Notes (Signed)
Pt given sprite 

## 2016-03-04 MED ORDER — NICOTINE 14 MG/24HR TD PT24
14.0000 mg | MEDICATED_PATCH | Freq: Every day | TRANSDERMAL | Status: DC
  Administered 2016-03-04: 14 mg via TRANSDERMAL
  Filled 2016-03-04: qty 1

## 2016-03-04 MED ORDER — STERILE WATER FOR INJECTION IJ SOLN
INTRAMUSCULAR | Status: AC
  Administered 2016-03-04: 13:00:00 via INTRAMUSCULAR
  Filled 2016-03-04: qty 10

## 2016-03-04 MED ORDER — ZIPRASIDONE MESYLATE 20 MG IM SOLR
10.0000 mg | Freq: Four times a day (QID) | INTRAMUSCULAR | Status: DC | PRN
  Administered 2016-03-04: 10 mg via INTRAMUSCULAR
  Filled 2016-03-04: qty 20

## 2016-03-04 NOTE — ED Notes (Signed)
Pt eating breakfast - sitting on mattress on floor. Pt states she did not intentionally overdose. States [facility] d/t wants to go home so can see her nephew who is home on leave.

## 2016-03-04 NOTE — ED Notes (Signed)
Pt continues w/continuous talking.

## 2016-03-04 NOTE — ED Notes (Signed)
Advised pt's visitor visitation time is over. She advised pt to remain calm while in ED.

## 2016-03-04 NOTE — BH Assessment (Signed)
[facility] per Florentina AddisonKatie. Pt is able to be transferred after 09:00 on 03/04/16. Accepting doctor is Dr. Revonda StandardMalhotra for Mood Disorder. Call to report is [phone]. Marquita PalmsMario, RN has been notified of the acceptance.   Princess BruinsAquicha Duff, MSW, Theresia MajorsLCSWA

## 2016-03-04 NOTE — ED Notes (Signed)
Pt noted to be cursing in loud tone voice w/friend who is visiting. Pt had advised earlier she would take Geodon but wanted to be awake in [facility].

## 2016-03-04 NOTE — ED Notes (Signed)
Pt's friend, Geraldine ContrasDee [phone]- [phone] - called and is aware pt is being moved to [facility].

## 2016-03-04 NOTE — ED Notes (Signed)
No vitals taken a this time because pt has just fallen asleep after complaining of not being able to sleep since she has been here. Vitals will be taken when pt awakes.

## 2016-03-04 NOTE — ED Notes (Signed)
Pt on phone at nurses' desk - pt has visitor.

## 2016-03-04 NOTE — ED Notes (Signed)
Pt at nurses station to receive phone call.

## 2016-03-04 NOTE — ED Notes (Signed)
Friend to leave room with security no issues.

## 2016-03-04 NOTE — ED Notes (Signed)
Lunch ordered 

## 2016-03-04 NOTE — ED Notes (Addendum)
Pt ambulatory to nurses' desk - asking if she can call her mother to come to ED to explain why she took too many Xanax. Advised her she may call her mother after 0830. Pt noted to be becoming irritable - then apologized for behavior when RN asked her to be patient w/the process and will advise her of tx plan. Pt stated "It's my fault I'm here anyway. I did it to myself." Pt then returned to room as asked.

## 2016-03-04 NOTE — ED Notes (Signed)
Pt on phone w/friend, Geraldine ContrasDee, advising her of need for clothing to take w/her to [facility]. Pt aware deputy called and advised will be here in approx 30 minutes.

## 2016-03-04 NOTE — ED Notes (Signed)
Pt up to restroom with sitter. 

## 2016-03-04 NOTE — ED Notes (Signed)
Verified w/[facility]nette, [facility] - pt is accepted and may arrive after 0900 this am - [phone] - requesting report to be called when pt en route.

## 2016-03-04 NOTE — ED Notes (Signed)
Pt apologized for behavior. Pt aware friend brought her a black bag of belongings to take w/her.

## 2016-03-04 NOTE — ED Notes (Signed)
Pt at nurses' desk asking if she can make another phone call - advised no d/t has had more than 2 that are allowed. Voiced understanding.

## 2016-03-04 NOTE — ED Notes (Signed)
Drinda ButtsAnnette, RN - [facility] aware deputy present and will be transporting momentarily.

## 2016-03-04 NOTE — ED Notes (Signed)
Pt given Benadryl as requested d/t anxiety/irritable.

## 2016-03-04 NOTE — ED Notes (Signed)
Pt noting to be yelling, cursing at [employer] and another MD in ED. Pt returned to room as asked.

## 2016-03-04 NOTE — ED Notes (Signed)
States has been having problems sleeping. States takes Xanax - does not have a therapist.

## 2016-03-04 NOTE — ED Notes (Signed)
Friend visiting pt. Advised may visit for 15 minutes.

## 2016-03-04 NOTE — ED Provider Notes (Addendum)
Patient sitting on mattress, denies complaint presently states "I'm doing as well as I can be." Nurse Dionicio StallBecky Berman reported to me that patient had been throwing objects earlier and has violent tendencies. Order for Geodon as needed for agitation written by me  Results for orders placed or performed during the hospital encounter of 03/02/16  CBC with Differential  Result Value Ref Range   WBC 7.1 4.0 - 10.5 K/uL   RBC 3.87 3.87 - 5.11 MIL/uL   Hemoglobin 13.1 12.0 - 15.0 g/dL   HCT 16.138.7 09.636.0 - 04.546.0 %   MCV 100.0 78.0 - 100.0 fL   MCH 33.9 26.0 - 34.0 pg   MCHC 33.9 30.0 - 36.0 g/dL   RDW 40.912.4 81.111.5 - 91.415.5 %   Platelets 265 150 - 400 K/uL   Neutrophils Relative % 56 %   Neutro Abs 4.0 1.7 - 7.7 K/uL   Lymphocytes Relative 37 %   Lymphs Abs 2.6 0.7 - 4.0 K/uL   Monocytes Relative 6 %   Monocytes Absolute 0.4 0.1 - 1.0 K/uL   Eosinophils Relative 1 %   Eosinophils Absolute 0.0 0.0 - 0.7 K/uL   Basophils Relative 0 %   Basophils Absolute 0.0 0.0 - 0.1 K/uL  Comprehensive metabolic panel  Result Value Ref Range   Sodium 140 135 - 145 mmol/L   Potassium 4.0 3.5 - 5.1 mmol/L   Chloride 109 101 - 111 mmol/L   CO2 23 22 - 32 mmol/L   Glucose, Bld 73 65 - 99 mg/dL   BUN 13 6 - 20 mg/dL   Creatinine, Ser 7.820.69 0.44 - 1.00 mg/dL   Calcium 9.7 8.9 - 95.610.3 mg/dL   Total Protein 7.5 6.5 - 8.1 g/dL   Albumin 4.5 3.5 - 5.0 g/dL   AST 18 15 - 41 U/L   ALT 18 14 - 54 U/L   Alkaline Phosphatase 57 38 - 126 U/L   Total Bilirubin 0.2 (L) 0.3 - 1.2 mg/dL   GFR calc non Af Amer >60 >60 mL/min   GFR calc Af Amer >60 >60 mL/min   Anion gap 8 5 - 15  Ethanol  Result Value Ref Range   Alcohol, Ethyl (B) <5 <5 mg/dL  Rapid urine drug screen (hospital performed)  Result Value Ref Range   Opiates NONE DETECTED NONE DETECTED   Cocaine NONE DETECTED NONE DETECTED   Benzodiazepines POSITIVE (A) NONE DETECTED   Amphetamines NONE DETECTED NONE DETECTED   Tetrahydrocannabinol NONE DETECTED NONE DETECTED    Barbiturates NONE DETECTED NONE DETECTED  Salicylate level  Result Value Ref Range   Salicylate Lvl <7.0 2.8 - 30.0 mg/dL  Acetaminophen level  Result Value Ref Range   Acetaminophen (Tylenol), Serum <10 (L) 10 - 30 ug/mL  I-Stat Beta hCG blood, ED ([facility], [facility], [facility] only)  Result Value Ref Range   I-stat hCG, quantitative <5.0 <5 mIU/mL   Comment 3           No results found.   Doug SouSam Amadu Schlageter, MD 03/04/16 0902 4 PM patient is alert and motor Glasgow Coma Score 15 cooperative stable [facility]   Doug SouSam Cambrey Lupi, MD 03/04/16 [phone]

## 2016-03-04 NOTE — ED Notes (Signed)
Tyron RussellAdvised Annette, [facility] - pt will be transported later this evening. Advised OK will hold bed.

## 2016-03-04 NOTE — ED Notes (Signed)
Pt up to nurses station to receive phone call. Sitter at pt side.

## 2016-03-04 NOTE — ED Notes (Signed)
Pt to receive lunch tray. 

## 2016-03-04 NOTE — ED Notes (Signed)
Pt to nurses station , pt becoming agitated about making phone call, needing her meds. Pt cursing at staff from room. Sitter at beside.

## 2016-03-04 NOTE — ED Notes (Signed)
Dr Ethelda ChickJacubowitz spoke w/pt.

## 2016-03-04 NOTE — ED Notes (Signed)
[facility], and may arrive at any point after 0900 today.   Pt to be roomed in [facility]. Receiving physician is Revonda StandardMalhotra, MD. Call report to [phone].

## 2016-03-04 NOTE — ED Notes (Signed)
Pt spoke w/friend on phone and is now at nurses' desk - very talkative. Pt w/repetitive conversation.

## 2016-03-04 NOTE — ED Notes (Signed)
Ordered pt breakfast tray, consisting of a blueberry muffin, bacon, Raisin Bran, scrambled eggs w/cheddar cheese.

## 2016-03-04 NOTE — ED Notes (Signed)
Benadryl given as requested.

## 2016-03-10 ENCOUNTER — Telehealth: Payer: Self-pay | Admitting: General Practice

## 2016-03-10 ENCOUNTER — Other Ambulatory Visit: Payer: Self-pay | Admitting: Physician Assistant

## 2016-03-10 MED ORDER — GABAPENTIN 300 MG PO CAPS: 300.0000 mg | ORAL_CAPSULE | Freq: Every day | ORAL | 3 refills | Status: DC

## 2016-03-10 MED ORDER — NAPROXEN 500 MG PO TABS
500.0000 mg | ORAL_TABLET | Freq: Two times a day (BID) | ORAL | 0 refills | Status: DC
Start: 2016-03-10 — End: 2016-04-14

## 2016-03-10 NOTE — Telephone Encounter (Signed)
Contacted to make her aware of rx sent to the pharmacy pt didn't answer lvm regarding information and if she has any questions or concerns to give me a call

## 2016-03-10 NOTE — Telephone Encounter (Signed)
Please call her let her know I sent her a prescription for something for the pain and for the tingling/numbness.

## 2016-03-10 NOTE — Telephone Encounter (Signed)
Patient said numbness is persisting, its not longer in arms, and left leg now and hands. Patient says that neck is in pain as well. Please follow up.

## 2016-03-10 NOTE — Telephone Encounter (Signed)
Will forward to [name]

## 2016-04-05 ENCOUNTER — Ambulatory Visit: Payer: Self-pay | Attending: Family Medicine | Admitting: Family Medicine

## 2016-04-05 ENCOUNTER — Encounter: Payer: Self-pay | Admitting: Family Medicine

## 2016-04-05 ENCOUNTER — Telehealth: Payer: Self-pay | Admitting: General Practice

## 2016-04-05 ENCOUNTER — Other Ambulatory Visit: Payer: Self-pay | Admitting: *Deleted

## 2016-04-05 ENCOUNTER — Ambulatory Visit (HOSPITAL_COMMUNITY)
Admission: RE | Admit: 2016-04-05 | Discharge: 2016-04-05 | Disposition: A | Discharge status: Home / Self Care | Payer: Self-pay | Source: Ambulatory Visit | Attending: Family Medicine | Admitting: Family Medicine

## 2016-04-05 VITALS — BP 107/74 | HR 75 | Temp 98.5°F | Resp 18 | Ht 62.0 in | Wt 180.4 lb

## 2016-04-05 DIAGNOSIS — R202 Paresthesia of skin: Secondary | ICD-10-CM | POA: Insufficient documentation

## 2016-04-05 DIAGNOSIS — G8929 Other chronic pain: Secondary | ICD-10-CM | POA: Insufficient documentation

## 2016-04-05 DIAGNOSIS — M545 Low back pain: Secondary | ICD-10-CM

## 2016-04-05 DIAGNOSIS — R200 Anesthesia of skin: Secondary | ICD-10-CM | POA: Insufficient documentation

## 2016-04-05 LAB — CBC WITH DIFFERENTIAL/PLATELET
BASOS ABS: 63 {cells}/uL (ref 0–200)
BASOS PCT: 1 %
EOS ABS: 63 {cells}/uL (ref 15–500)
Eosinophils Relative: 1 %
HEMATOCRIT: 39.5 % (ref 35.0–45.0)
HEMOGLOBIN: 13 g/dL (ref 11.7–15.5)
LYMPHS ABS: 2331 {cells}/uL (ref 850–3900)
Lymphocytes Relative: 37 %
MCH: 32.6 pg (ref 27.0–33.0)
MCHC: 32.9 g/dL (ref 32.0–36.0)
MCV: 99 fL (ref 80.0–100.0)
MONO ABS: 378 {cells}/uL (ref 200–950)
MONOS PCT: 6 %
MPV: 8.8 fL (ref 7.5–12.5)
NEUTROS ABS: 3465 {cells}/uL (ref 1500–7800)
Neutrophils Relative %: 55 %
PLATELETS: 353 10*3/uL (ref 140–400)
RBC: 3.99 MIL/uL (ref 3.80–5.10)
RDW: 12.8 % (ref 11.0–15.0)
WBC: 6.3 10*3/uL (ref 3.8–10.8)

## 2016-04-05 LAB — BASIC METABOLIC PANEL
BUN: 11 mg/dL (ref 7–25)
CHLORIDE: 107 mmol/L (ref 98–110)
CO2: 20 mmol/L (ref 20–31)
CREATININE: 0.53 mg/dL (ref 0.50–1.10)
Calcium: 9 mg/dL (ref 8.6–10.2)
Glucose, Bld: 94 mg/dL (ref 65–99)
POTASSIUM: 4.3 mmol/L (ref 3.5–5.3)
Sodium: 139 mmol/L (ref 135–146)

## 2016-04-05 MED ORDER — GABAPENTIN 300 MG PO CAPS: 300.0000 mg | ORAL_CAPSULE | Freq: Two times a day (BID) | ORAL | 2 refills | Status: DC

## 2016-04-05 MED ORDER — IBUPROFEN 800 MG PO TABS: 800.0000 mg | ORAL_TABLET | Freq: Three times a day (TID) | ORAL | 0 refills | Status: DC | PRN

## 2016-04-05 NOTE — Telephone Encounter (Signed)
Reordered per PCP with corrected dispense quantity.

## 2016-04-05 NOTE — Progress Notes (Signed)
Patient is here for F/up Bp.  Patient decline the fu shot today.

## 2016-04-05 NOTE — Telephone Encounter (Signed)
Patient said she is supposed to take 600 mg of gabapentin, a total of 60 pills. Patient was given 30 pill, 300 mg, 2 pills a day.  Please follow up

## 2016-04-05 NOTE — Progress Notes (Signed)
Subjective:  Patient ID: Denise Hashimotohristie Lynn Stock, female    DOB: 1980/12/05  Age: 35 y.o. MRN: [id number]  CC: Establish Care   HPI [name] presents for c/o chronic lower back pain and paresthesias to bilateral upper and lower extremities.She denies any trauma. She denies any change in bowel or bladder habits. She is requesting medications for pain. Patient later reported she still has some of her prescription narcotic medications left.   Outpatient Medications Prior to Visit  Medication Sig Dispense Refill  . alprazolam (XANAX) 2 MG tablet Take 2 mg by mouth 2 (two) times daily.    . Aspirin-Acetaminophen-Caffeine (GOODY HEADACHE PO) Take 1-2 packets by mouth every 6 (six) hours as needed (pain/ headache).    . diphenhydrAMINE (BENADRYL) 25 mg capsule Take 25-50 mg by mouth every 6 (six) hours as needed for sleep (anxiety).     Marland Kitchen. HYDROcodone-acetaminophen (NORCO/VICODIN) 5-325 MG tablet Take 1 tablet by mouth every 6 (six) hours as needed for moderate pain.    . naproxen (NAPROSYN) 500 MG tablet Take 1 tablet (500 mg total) by mouth 2 (two) times daily with a meal. Prn pain 60 tablet 0  . omeprazole (PRILOSEC OTC) 20 MG tablet Take 20 mg by mouth daily.    Marland Kitchen. gabapentin (NEURONTIN) 300 MG capsule Take 1 capsule (300 mg total) by mouth at bedtime. For numbness 30 capsule 3  . amoxicillin (AMOXIL) 500 MG capsule Take 500 mg by mouth 4 (four) times daily.    . mirtazapine (REMERON) 15 MG tablet Take 15-30 mg by mouth daily.     . ondansetron (ZOFRAN) 4 MG tablet Take 1 tablet (4 mg total) by mouth every 6 (six) hours. (Patient not taking: Reported on 04/05/2016) 12 tablet 0  . oxyCODONE-acetaminophen (PERCOCET) 5-325 MG tablet Take 1 tablet by mouth every 6 (six) hours as needed (pain from wisdom tootah removal).      No facility-administered medications prior to visit.     ROS Review of Systems  Respiratory: Negative.   Cardiovascular: Negative.   Gastrointestinal:  Negative.   Musculoskeletal: Positive for back pain (lower back).  Neurological:       Pt.reports tingling of BUE/BLE.     Objective:  BP 107/74 (BP Location: Right Arm, Patient Position: Sitting, Cuff Size: Normal)   Pulse 75   Temp 98.5 F (36.9 C) (Oral)   Resp 18   Ht 5\' 2"  (1.575 m)   Wt 180 lb 6.4 oz (81.8 kg)   LMP 03/08/2016   SpO2 99%   BMI 33.00 kg/m   BP/Weight 04/05/2016 03/04/2016 03/02/2016  Systolic BP 107 115 -  Diastolic BP 74 81 -  Wt. (Lbs) 180.4 - 165  BMI 33 - 30.18  Some encounter information is confidential and restricted. Go to Review Flowsheets activity to see all data.     Physical Exam  Constitutional: She is oriented to person, place, and time. She appears well-developed and well-nourished.  Cardiovascular: Normal rate, regular rhythm and normal heart sounds.   Pulmonary/Chest: Effort normal and breath sounds normal.  Abdominal: Soft. Bowel sounds are normal.  Musculoskeletal:       Lumbar back: She exhibits normal range of motion and no pain.  Straight leg test negative.  Neurological: She is alert and oriented to person, place, and time.  Reflex Scores:      Tricep reflexes are 2+ on the right side and 2+ on the left side.      Bicep reflexes are 2+ on  the right side and 2+ on the left side.      Brachioradialis reflexes are 2+ on the right side and 2+ on the left side.      Patellar reflexes are 2+ on the right side and 2+ on the left side.      Achilles reflexes are 2+ on the right side and 2+ on the left side.  Assessment & Plan:   Problem List Items Addressed This Visit    None    Visit Diagnoses    Chronic midline low back pain, with sciatica presence unspecified    -  Primary   Relevant Medications   ibuprofen (ADVIL,MOTRIN) 800 MG tablet   Other Relevant Orders   DG Lumbar Spine Complete (Completed)   Numbness and tingling       Relevant Medications   gabapentin (NEURONTIN) 300 MG capsule   Other Relevant Orders    Ambulatory referral to Neurology   Basic Metabolic Panel   CBC with Differential   Vitamin D, 25-hydroxy     Meds ordered this encounter  Medications  . DISCONTD: gabapentin (NEURONTIN) 300 MG capsule    Sig: Take 1 capsule (300 mg total) by mouth 2 (two) times daily. For numbness    Dispense:  30 capsule    Refill:  2    Order Specific Question:   Supervising Provider    Answer:   Quentin AngstJEGEDE, OLUGBEMIGA E [id number]]  . DISCONTD: ibuprofen (ADVIL,MOTRIN) 800 MG tablet    Sig: Take 1 tablet (800 mg total) by mouth every 8 (eight) hours as needed.    Dispense:  24 tablet    Refill:  0    Order Specific Question:   Supervising Provider    Answer:   Quentin AngstJEGEDE, OLUGBEMIGA E [id number]]  . gabapentin (NEURONTIN) 300 MG capsule    Sig: Take 1 capsule (300 mg total) by mouth 2 (two) times daily. For numbness    Dispense:  30 capsule    Refill:  2    Order Specific Question:   Supervising Provider    Answer:   Quentin AngstJEGEDE, OLUGBEMIGA E [id number]]  . ibuprofen (ADVIL,MOTRIN) 800 MG tablet    Sig: Take 1 tablet (800 mg total) by mouth every 8 (eight) hours as needed (Take with food.).    Dispense:  24 tablet    Refill:  0    Order Specific Question:   Supervising Provider    Answer:   Quentin AngstJEGEDE, OLUGBEMIGA E [id number]]    Follow-up: Return if symptoms worsen or fail to improve.   Lizbeth BarkMandesia R Rishav Rockefeller FNP

## 2016-04-05 NOTE — Telephone Encounter (Signed)
Reordered to the pharmacy

## 2016-04-05 NOTE — Patient Instructions (Addendum)
Back Exercises Introduction If you have pain in your back, do these exercises 2-3 times each day or as told by your doctor. When the pain goes away, do the exercises once each day, but repeat the steps more times for each exercise (do more repetitions). If you do not have pain in your back, do these exercises once each day or as told by your doctor. Exercises Single Knee to Chest  Do these steps 3-5 times in a row for each leg: 1. Lie on your back on a firm bed or the floor with your legs stretched out. 2. Bring one knee to your chest. 3. Hold your knee to your chest by grabbing your knee or thigh. 4. Pull on your knee until you feel a gentle stretch in your lower back. 5. Keep doing the stretch for 10-30 seconds. 6. Slowly let go of your leg and straighten it. Pelvic Tilt  Do these steps 5-10 times in a row: 1. Lie on your back on a firm bed or the floor with your legs stretched out. 2. Bend your knees so they point up to the ceiling. Your feet should be flat on the floor. 3. Tighten your lower belly (abdomen) muscles to press your lower back against the floor. This will make your tailbone point up to the ceiling instead of pointing down to your feet or the floor. 4. Stay in this position for 5-10 seconds while you gently tighten your muscles and breathe evenly. Cat-Cow  Do these steps until your lower back bends more easily: 1. Get on your hands and knees on a firm surface. Keep your hands under your shoulders, and keep your knees under your hips. You may put padding under your knees. 2. Let your head hang down, and make your tailbone point down to the floor so your lower back is round like the back of a cat. 3. Stay in this position for 5 seconds. 4. Slowly lift your head and make your tailbone point up to the ceiling so your back hangs low (sags) like the back of a cow. 5. Stay in this position for 5 seconds. Press-Ups  Do these steps 5-10 times in a row: 1. Lie on your belly  (face-down) on the floor. 2. Place your hands near your head, about shoulder-width apart. 3. While you keep your back relaxed and keep your hips on the floor, slowly straighten your arms to raise the top half of your body and lift your shoulders. Do not use your back muscles. To make yourself more comfortable, you may change where you place your hands. 4. Stay in this position for 5 seconds. 5. Slowly return to lying flat on the floor. Bridges  Do these steps 10 times in a row: 1. Lie on your back on a firm surface. 2. Bend your knees so they point up to the ceiling. Your feet should be flat on the floor. 3. Tighten your butt muscles and lift your butt off of the floor until your waist is almost as high as your knees. If you do not feel the muscles working in your butt and the back of your thighs, slide your feet 1-2 inches farther away from your butt. 4. Stay in this position for 3-5 seconds. 5. Slowly lower your butt to the floor, and let your butt muscles relax. If this exercise is too easy, try doing it with your arms crossed over your chest. Belly Crunches  Do these steps 5-10 times in a row: 1. Lie   on your back on a firm bed or the floor with your legs stretched out. 2. Bend your knees so they point up to the ceiling. Your feet should be flat on the floor. 3. Cross your arms over your chest. 4. Tip your chin a little bit toward your chest but do not bend your neck. 5. Tighten your belly muscles and slowly raise your chest just enough to lift your shoulder blades a tiny bit off of the floor. 6. Slowly lower your chest and your head to the floor. Back Lifts  Do these steps 5-10 times in a row: 1. Lie on your belly (face-down) with your arms at your sides, and rest your forehead on the floor. 2. Tighten the muscles in your legs and your butt. 3. Slowly lift your chest off of the floor while you keep your hips on the floor. Keep the back of your head in line with the curve in your back.  Look at the floor while you do this. 4. Stay in this position for 3-5 seconds. 5. Slowly lower your chest and your face to the floor. Contact a doctor if:  Your back pain gets a lot worse when you do an exercise.  Your back pain does not lessen 2 hours after you exercise. If you have any of these problems, stop doing the exercises. Do not do them again unless your doctor says it is okay. Get help right away if:  You have sudden, very bad back pain. If this happens, stop doing the exercises. Do not do them again unless your doctor says it is okay. This information is not intended to replace advice given to you by your health care provider. Make sure you discuss any questions you have with your health care provider. Document Released: 04/29/2010 Document Revised: 09/02/2015 Document Reviewed: 05/21/2014  2017 Elsevier  Musculoskeletal Pain Musculoskeletal pain is muscle and bone aches and pains. This pain can occur in any part of the body. Follow these instructions at home:  Only take medicines for pain, discomfort, or fever as told by your health care provider.  You may continue all activities unless the activities cause more pain. When the pain lessens, slowly resume normal activities. Gradually increase the intensity and duration of the activities or exercise.  During periods of severe pain, bed rest may be helpful. Lie or sit in any position that is comfortable, but get out of bed and walk around at least every several hours.  If directed, put ice on the injured area.  Put ice in a plastic bag.  Place a towel between your skin and the bag.  Leave the ice on for 20 minutes, 2-3 times a day. Contact a health care provider if:  Your pain is getting worse.  Your pain is not relieved with medicines.  You lose function in the area of the pain if the pain is in your arms, legs, or neck. This information is not intended to replace advice given to you by your health care provider.  Make sure you discuss any questions you have with your health care provider. Document Released: 03/27/2005 Document Revised: 09/07/2015 Document Reviewed: 11/29/2012 Elsevier Interactive Patient Education  2017 [employer]

## 2016-04-06 LAB — VITAMIN D 25 HYDROXY (VIT D DEFICIENCY, FRACTURES): Vit D, 25-Hydroxy: 19 ng/mL — ABNORMAL LOW (ref 30–100)

## 2016-04-11 ENCOUNTER — Other Ambulatory Visit: Payer: Self-pay | Admitting: Family Medicine

## 2016-04-11 DIAGNOSIS — E559 Vitamin D deficiency, unspecified: Secondary | ICD-10-CM

## 2016-04-11 DIAGNOSIS — K5900 Constipation, unspecified: Secondary | ICD-10-CM

## 2016-04-11 MED ORDER — PSYLLIUM 28 % PO PACK: 1.0000 | PACK | Freq: Two times a day (BID) | ORAL | 0 refills | Status: DC

## 2016-04-11 MED ORDER — VITAMIN D (ERGOCALCIFEROL) 1.25 MG (50000 UNIT) PO CAPS: 50000.0000 [IU] | ORAL_CAPSULE | ORAL | 0 refills | Status: AC

## 2016-04-11 MED ORDER — VITAMIN D (ERGOCALCIFEROL) 1.25 MG (50000 UNIT) PO CAPS: 50000.0000 [IU] | ORAL_CAPSULE | ORAL | 0 refills | Status: DC

## 2016-04-13 NOTE — Telephone Encounter (Signed)
-----   Message from Lizbeth BarkMandesia R Hairston, FNP sent at 04/11/2016  2:11 PM EST ----- -Vitamin D level was low. Vitamin D helps to keep bones strong. You were prescribed ergocalciferol (capsules) to increase your vitamin-d level. Once finished start taking OTC vitamin d supplement with 800 international units (IU) of vitamin-d per day.  - X-ray normal. Did show stool present. You were prescribed metamucil for constipation. -[facility] [address]

## 2016-04-13 NOTE — Telephone Encounter (Signed)
Patient verified DOB Patient is aware of vitamin d level being low and a supplement being [facility] to the [facility]. Patient is also aware of x-ray being normal but showing some stool being present. Patient is aware of metamucil being ordered to the pharmacy and the gabapentin dispense being corrected. Patient expressed understanding and had no further questions at this time.

## 2016-04-14 ENCOUNTER — Other Ambulatory Visit: Payer: Self-pay | Admitting: Family Medicine

## 2016-04-14 ENCOUNTER — Telehealth: Payer: Self-pay | Admitting: General Practice

## 2016-04-14 MED ORDER — NAPROXEN 500 MG PO TABS: 500.0000 mg | ORAL_TABLET | Freq: Two times a day (BID) | ORAL | 0 refills | Status: DC

## 2016-04-14 NOTE — Telephone Encounter (Signed)
Medical Assistant left message on patient's home and cell voicemail. Voicemail states to give a call back [facility] [place] [facility] at [phone]. Patient is aware of naprosyn being [facility] [address]

## 2016-04-14 NOTE — Telephone Encounter (Signed)
Patient is stating naprosyn gave her better relief than the ibuprofen and would like to switch back if possible.

## 2016-04-14 NOTE — Telephone Encounter (Signed)
Patient is wanting to be prescribed switched back to naproxen instead on ibuprofen. Patient feels that naproxen works better. Please follow up.  Please follow up.

## 2016-04-14 NOTE — Telephone Encounter (Signed)
Naprosyn (naproxen) was prescribed and [facility] on [facility].

## 2016-04-25 ENCOUNTER — Emergency Department (HOSPITAL_COMMUNITY)
Admission: EM | Admit: 2016-04-25 | Discharge: 2016-04-25 | Discharge status: Against Medical Advice | Payer: Self-pay | Attending: Emergency Medicine | Admitting: Emergency Medicine

## 2016-04-25 ENCOUNTER — Encounter (HOSPITAL_COMMUNITY): Payer: Self-pay | Admitting: Emergency Medicine

## 2016-04-25 DIAGNOSIS — F17210 Nicotine dependence, cigarettes, uncomplicated: Secondary | ICD-10-CM | POA: Insufficient documentation

## 2016-04-25 DIAGNOSIS — T50901A Poisoning by unspecified drugs, medicaments and biological substances, accidental (unintentional), initial encounter: Secondary | ICD-10-CM

## 2016-04-25 DIAGNOSIS — Z7982 Long term (current) use of aspirin: Secondary | ICD-10-CM | POA: Insufficient documentation

## 2016-04-25 DIAGNOSIS — Z79899 Other long term (current) drug therapy: Secondary | ICD-10-CM | POA: Insufficient documentation

## 2016-04-25 DIAGNOSIS — T424X1A Poisoning by benzodiazepines, accidental (unintentional), initial encounter: Secondary | ICD-10-CM | POA: Insufficient documentation

## 2016-04-25 NOTE — ED Provider Notes (Signed)
[facility]-EMERGENCY DEPT Provider Note   CSN: [id number] Arrival date & time: 04/25/16  1930     History   Chief Complaint Chief Complaint  Patient presents with  . Ingestion    HPI [name] is a 35 y.o. female.  36 year old female here after taking alcohol and taking Klonopin. Does have a history of bipolar disorder. She also has a history of alcohol and polysubstance abuse. Patient states that she had 11 months of sobriety but due to stressors at home she drank a pint of liquor today. She also admits to taking 6 Klonopin tablets. She denies that this was a suicide attempt. Denies hallucinating. No other substances use. Does have a history of suicide attempt. When I directly asked her if she was trying to take her life she said that if she really wanted to commit suicide, she wouldn't be here talking to me. Police were called as well and patient had to be convinced to come here. She complains of being slightly sleepy at this time but has no other somatic complaints.      Past Medical History:  Diagnosis Date  . Anxiety   . Benzodiazepine abuse   . Bipolar 1 disorder (HCC)   . Depression   . Drug abuse   . ETOH abuse   . Polysubstance abuse     Patient Active Problem List   Diagnosis Date Noted  . Substance induced mood disorder (HCC) 06/28/2015  . 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 Polysubstance dependence (HCC) 06/06/2015  . Polysubstance dependence including opioid type drug, episodic abuse (HCC) 06/03/2015  . Polysubstance abuse 11/01/2013  . Benzodiazepine dependence (HCC) 11/01/2013  . Bipolar I disorder, most recent episode (or current) manic, unspecified 02/25/2013  . Substance abuse/dependence 02/21/2013    History reviewed. No pertinent surgical history.  OB History    No data available       Home Medications    Prior to Admission medications   Medication Sig Start Date End Date Taking? Authorizing Provider    alprazolam Prudy Feeler) 2 MG tablet Take 2 mg by mouth 2 (two) times daily.    Historical Provider, MD  amoxicillin (AMOXIL) 500 MG capsule Take 500 mg by mouth 4 (four) times daily.    Historical Provider, MD  Aspirin-Acetaminophen-Caffeine (GOODY HEADACHE PO) Take 1-2 packets by mouth every 6 (six) hours as needed (pain/ headache).    Historical Provider, MD  diphenhydrAMINE (BENADRYL) 25 mg capsule Take 25-50 mg by mouth every 6 (six) hours as needed for sleep (anxiety).     Historical Provider, MD  gabapentin (NEURONTIN) 300 MG capsule Take 1 capsule (300 mg total) by mouth 2 (two) times daily. For numbness 04/05/16   Lizbeth Bark, FNP  HYDROcodone-acetaminophen (NORCO/VICODIN) 5-325 MG tablet Take 1 tablet by mouth every 6 (six) hours as needed for moderate pain.    Historical Provider, MD  mirtazapine (REMERON) 15 MG tablet Take 15-30 mg by mouth daily.     Historical Provider, MD  naproxen (NAPROSYN) 500 MG tablet Take 1 tablet (500 mg total) by mouth 2 (two) times daily with a meal. As needed for pain. 04/14/16   Lizbeth Bark, FNP  omeprazole (PRILOSEC OTC) 20 MG tablet Take 20 mg by mouth daily.    Historical Provider, MD  ondansetron (ZOFRAN) 4 MG tablet Take 1 tablet (4 mg total) by mouth every 6 (six) hours. Patient not taking: Reported on 04/05/2016 07/01/15   Bethel Born, PA-C  oxyCODONE-acetaminophen [facility])  5-325 MG tablet Take 1 tablet by mouth every 6 (six) hours as needed (pain from wisdom tootah removal).     Historical Provider, MD  psyllium (METAMUCIL SMOOTH TEXTURE) 28 % packet Take 1 packet by mouth 2 (two) times daily. 04/11/16   Lizbeth BarkMandesia R Hairston, FNP  Vitamin D, Ergocalciferol, (DRISDOL) 50000 units CAPS capsule Take 1 capsule (50,000 Units total) by mouth every 7 (seven) days. 04/11/16 05/31/16  Lizbeth BarkMandesia R Hairston, FNP    Family History History reviewed. No pertinent family history.  Social History Social History  Substance Use Topics  . Smoking  status: Current Every Day Smoker    Packs/day: 2.00    Years: 17.00    Types: Cigarettes  . Smokeless tobacco: Never Used  . Alcohol use 7.2 oz/week    12 Cans of beer per week     Comment: Per report; Pt denied current use; BAC clear     Allergies   Haldol [haloperidol lactate]   Review of Systems Review of Systems  All other systems reviewed and are negative.    Physical Exam Updated Vital Signs BP 102/67 (BP Location: Right Arm)   Pulse 92   Temp 97.8 F (36.6 C) (Oral)   Resp 14   Ht 5\' 2"  (1.575 m)   Wt 81.6 kg   LMP 03/08/2016   SpO2 100%   BMI 32.92 kg/m   Physical Exam  Constitutional: She is oriented to person, place, and time. She appears well-developed and well-nourished.  Non-toxic appearance. No distress.  HENT:  Head: Normocephalic and atraumatic.  Eyes: Conjunctivae, EOM and lids are normal. Pupils are equal, round, and reactive to light.  Neck: Normal range of motion. Neck supple. No tracheal deviation present. No thyroid mass present.  Cardiovascular: Normal rate, regular rhythm and normal heart sounds.  Exam reveals no gallop.   No murmur heard. Pulmonary/Chest: Effort normal and breath sounds normal. No stridor. No respiratory distress. She has no decreased breath sounds. She has no wheezes. She has no rhonchi. She has no rales.  Abdominal: Soft. Normal appearance and bowel sounds are normal. She exhibits no distension. There is no tenderness. There is no rebound and no CVA tenderness.  Musculoskeletal: Normal range of motion. She exhibits no edema or tenderness.  Neurological: She is alert and oriented to person, place, and time. She has normal strength. No cranial nerve deficit or sensory deficit. GCS eye subscore is 4. GCS verbal subscore is 5. GCS motor subscore is 6.  Skin: Skin is warm and dry. No abrasion and no rash noted.  Psychiatric: Her affect is blunt. Her speech is delayed. She is withdrawn. She is not actively hallucinating. She  expresses no homicidal and no suicidal ideation. She expresses no suicidal plans and no homicidal plans.  Nursing note and vitals reviewed.    ED Treatments / Results  Labs (all labs ordered are listed, but only abnormal results are displayed) Labs Reviewed - No data to display  EKG  EKG Interpretation None       Radiology No results found.  Procedures Procedures (including critical care time)  Medications Ordered in ED Medications - No data to display   Initial Impression / Assessment and Plan / ED Course  I have reviewed the triage vital signs and the nursing notes.  Pertinent labs & imaging results that were available during my care of the patient were reviewed by me and considered in my medical decision making (see chart for details).  Clinical Course  Pt eloped from the department  Final Clinical Impressions(s) / ED Diagnoses   Final diagnoses:  None    New Prescriptions New Prescriptions   No medications on file     Lorre Nick, MD 05/02/16 1318

## 2016-04-25 NOTE — ED Triage Notes (Signed)
Per EMS, patient's roommate called 911 stating patient with possible overdose.  When EMS arrived, patient A/O x 4.  Patient stated, at first, that she took 50 visteril, 6 clonipin, 6 geodon.  Once patient arrived at hospital, patient stated that she only took 6 clonipin and pint of liquor.  Today would have been 11 months sober.  Patient states that she did not do this to harm herself.  [employer] made patient come to hospital or they would have IVC'd her.

## 2016-04-25 NOTE — ED Notes (Signed)
Told patient that she needs to do what we say, because she doesn't want to get into a gown. [employer] says that they will IVC her if they need to.

## 2016-04-25 NOTE — ED Notes (Signed)
Pt refuses to get into a gown because "I don't plan on staying"

## 2016-04-25 NOTE — ED Triage Notes (Signed)
108/62, 20G RAC, CBG 127, NSR 90 HR, 97% RA  Given bolus en route.

## 2016-04-25 NOTE — ED Notes (Signed)
Upon my assessment, pt was ignoring, and not answering any questions. After ambulating to the bathroom, returned to the room. Put on her clothes, pulled her IV and ran out. MD, security  Notified. Left AMA

## 2016-05-12 ENCOUNTER — Ambulatory Visit: Payer: Self-pay | Attending: Family Medicine

## 2016-05-25 ENCOUNTER — Other Ambulatory Visit: Payer: Self-pay | Admitting: Family Medicine

## 2016-05-25 NOTE — Telephone Encounter (Signed)
Patient called upset because nurse called advising that she would need an appt before she may have a refill of her meds per [facility]. I advised that we have to go by what the physician advises, pt stated that she did not see the need to come for an appt but will do so. Pt would like to know if she may have enough meds to last until her appt on 06/05/16. Please f/u with patient.

## 2016-05-25 NOTE — Telephone Encounter (Signed)
CMA call to inform that will need an office visit  Patient did not answer, but CMA left a VM stating the info and if have any questions just call back  To scheduled the office visit

## 2016-05-25 NOTE — Telephone Encounter (Signed)
Please inform patient no refills at this time. She will need to make an appointment for an office visit.

## 2016-05-25 NOTE — Telephone Encounter (Signed)
Patient called asking for a refill of her Neurtontin and her Naprosyn. [facility] and would like to receive a call when the meds are refilled. Please f/u with patient.

## 2016-05-29 ENCOUNTER — Other Ambulatory Visit: Payer: Self-pay | Admitting: Family Medicine

## 2016-05-29 DIAGNOSIS — M545 Low back pain, unspecified: Secondary | ICD-10-CM | POA: Insufficient documentation

## 2016-05-29 DIAGNOSIS — G8929 Other chronic pain: Secondary | ICD-10-CM | POA: Insufficient documentation

## 2016-05-29 MED ORDER — IBUPROFEN 800 MG PO TABS: 800.0000 mg | ORAL_TABLET | Freq: Three times a day (TID) | ORAL | 0 refills | Status: DC | PRN

## 2016-05-29 NOTE — Telephone Encounter (Signed)
Please notify patient I will refill her ibuprofen prescription unit next office visit only.

## 2016-05-29 NOTE — Telephone Encounter (Signed)
CMA call to inform patient that she will put a refill of ibuprofen until her next office visit  Patient did not answer  CMA left a VM stating the information if have any question just call me back

## 2016-06-05 ENCOUNTER — Ambulatory Visit: Payer: Self-pay | Attending: Family Medicine | Admitting: Family Medicine

## 2016-06-05 ENCOUNTER — Encounter: Payer: Self-pay | Admitting: Family Medicine

## 2016-06-05 VITALS — BP 104/67 | HR 89 | Temp 98.3°F | Resp 18 | Ht 62.0 in | Wt 186.0 lb

## 2016-06-05 DIAGNOSIS — G8929 Other chronic pain: Secondary | ICD-10-CM | POA: Insufficient documentation

## 2016-06-05 DIAGNOSIS — Z7982 Long term (current) use of aspirin: Secondary | ICD-10-CM | POA: Insufficient documentation

## 2016-06-05 DIAGNOSIS — Z760 Encounter for issue of repeat prescription: Secondary | ICD-10-CM | POA: Insufficient documentation

## 2016-06-05 DIAGNOSIS — M5441 Lumbago with sciatica, right side: Secondary | ICD-10-CM | POA: Insufficient documentation

## 2016-06-05 MED ORDER — NAPROXEN 500 MG PO TABS: 500.0000 mg | ORAL_TABLET | Freq: Two times a day (BID) | ORAL | 0 refills | Status: DC

## 2016-06-05 MED ORDER — GABAPENTIN 300 MG PO CAPS: 300.0000 mg | ORAL_CAPSULE | Freq: Two times a day (BID) | ORAL | 2 refills | Status: DC

## 2016-06-05 NOTE — Patient Instructions (Addendum)
Apply for orange card to complete referral process.    Back Exercises Introduction If you have pain in your back, do these exercises 2-3 times each day or as told by your doctor. When the pain goes away, do the exercises once each day, but repeat the steps more times for each exercise (do more repetitions). If you do not have pain in your back, do these exercises once each day or as told by your doctor. Exercises Single Knee to Chest  Do these steps 3-5 times in a row for each leg: 1. Lie on your back on a firm bed or the floor with your legs stretched out. 2. Bring one knee to your chest. 3. Hold your knee to your chest by grabbing your knee or thigh. 4. Pull on your knee until you feel a gentle stretch in your lower back. 5. Keep doing the stretch for 10-30 seconds. 6. Slowly let go of your leg and straighten it. Pelvic Tilt  Do these steps 5-10 times in a row: 1. Lie on your back on a firm bed or the floor with your legs stretched out. 2. Bend your knees so they point up to the ceiling. Your feet should be flat on the floor. 3. Tighten your lower belly (abdomen) muscles to press your lower back against the floor. This will make your tailbone point up to the ceiling instead of pointing down to your feet or the floor. 4. Stay in this position for 5-10 seconds while you gently tighten your muscles and breathe evenly. Cat-Cow  Do these steps until your lower back bends more easily: 1. Get on your hands and knees on a firm surface. Keep your hands under your shoulders, and keep your knees under your hips. You may put padding under your knees. 2. Let your head hang down, and make your tailbone point down to the floor so your lower back is round like the back of a cat. 3. Stay in this position for 5 seconds. 4. Slowly lift your head and make your tailbone point up to the ceiling so your back hangs low (sags) like the back of a cow. 5. Stay in this position for 5 seconds. Press-Ups  Do these  steps 5-10 times in a row: 1. Lie on your belly (face-down) on the floor. 2. Place your hands near your head, about shoulder-width apart. 3. While you keep your back relaxed and keep your hips on the floor, slowly straighten your arms to raise the top half of your body and lift your shoulders. Do not use your back muscles. To make yourself more comfortable, you may change where you place your hands. 4. Stay in this position for 5 seconds. 5. Slowly return to lying flat on the floor. Bridges  Do these steps 10 times in a row: 1. Lie on your back on a firm surface. 2. Bend your knees so they point up to the ceiling. Your feet should be flat on the floor. 3. Tighten your butt muscles and lift your butt off of the floor until your waist is almost as high as your knees. If you do not feel the muscles working in your butt and the back of your thighs, slide your feet 1-2 inches farther away from your butt. 4. Stay in this position for 3-5 seconds. 5. Slowly lower your butt to the floor, and let your butt muscles relax. If this exercise is too easy, try doing it with your arms crossed over your chest. Belly Crunches  Do these steps 5-10 times in a row: 1. Lie on your back on a firm bed or the floor with your legs stretched out. 2. Bend your knees so they point up to the ceiling. Your feet should be flat on the floor. 3. Cross your arms over your chest. 4. Tip your chin a little bit toward your chest but do not bend your neck. 5. Tighten your belly muscles and slowly raise your chest just enough to lift your shoulder blades a tiny bit off of the floor. 6. Slowly lower your chest and your head to the floor. Back Lifts  Do these steps 5-10 times in a row: 1. Lie on your belly (face-down) with your arms at your sides, and rest your forehead on the floor. 2. Tighten the muscles in your legs and your butt. 3. Slowly lift your chest off of the floor while you keep your hips on the floor. Keep the back of  your head in line with the curve in your back. Look at the floor while you do this. 4. Stay in this position for 3-5 seconds. 5. Slowly lower your chest and your face to the floor. Contact a doctor if:  Your back pain gets a lot worse when you do an exercise.  Your back pain does not lessen 2 hours after you exercise. If you have any of these problems, stop doing the exercises. Do not do them again unless your doctor says it is okay. Get help right away if:  You have sudden, very bad back pain. If this happens, stop doing the exercises. Do not do them again unless your doctor says it is okay. This information is not intended to replace advice given to you by your health care provider. Make sure you discuss any questions you have with your health care provider. Document Released: 04/29/2010 Document Revised: 09/02/2015 Document Reviewed: 05/21/2014  2017 Elsevier   Back Pain, Adult Introduction Back pain is very common. The pain often gets better over time. The cause of back pain is usually not dangerous. Most people can learn to manage their back pain on their own. Follow these instructions at home: Watch your back pain for any changes. The following actions may help to lessen any pain you are feeling:  Stay active. Start with short walks on flat ground if you can. Try to walk farther each day.  Exercise regularly as told by your doctor. Exercise helps your back heal faster. It also helps avoid future injury by keeping your muscles strong and flexible.  Do not sit, drive, or stand in one place for more than 30 minutes.  Do not stay in bed. Resting more than 1-2 days can slow down your recovery.  Be careful when you bend or lift an object. Use good form when lifting:  Bend at your knees.  Keep the object close to your body.  Do not twist.  Sleep on a firm mattress. Lie on your side, and bend your knees. If you lie on your back, put a pillow under your knees.  Take medicines  only as told by your doctor.  Put ice on the injured area.  Put ice in a plastic bag.  Place a towel between your skin and the bag.  Leave the ice on for 20 minutes, 2-3 times a day for the first 2-3 days. After that, you can switch between ice and heat packs.  Avoid feeling anxious or stressed. Find good ways to deal with stress, such as  exercise.  Maintain a healthy weight. Extra weight puts stress on your back. Contact a doctor if:  You have pain that does not go away with rest or medicine.  You have worsening pain that goes down into your legs or buttocks.  You have pain that does not get better in one week.  You have pain at night.  You lose weight.  You have a fever or chills. Get help right away if:  You cannot control when you poop (bowel movement) or pee (urinate).  Your arms or legs feel weak.  Your arms or legs lose feeling (numbness).  You feel sick to your stomach (nauseous) or throw up (vomit).  You have belly (abdominal) pain.  You feel like you may pass out (faint). This information is not intended to replace advice given to you by your health care provider. Make sure you discuss any questions you have with your health care provider. Document Released: 09/13/2007 Document Revised: 09/02/2015 Document Reviewed: 07/29/2013  2017 Elsevier

## 2016-06-05 NOTE — Progress Notes (Signed)
Patient is here for med refill  Patient denies pain today  Patient has taking her meds today  Patient has not eaten today

## 2016-06-05 NOTE — Progress Notes (Signed)
Subjective:  Patient ID: Denise Hashimotohristie Lynn Lonzo, female    DOB: 1981/01/09  Age: 36 y.o. MRN: [id number]  CC: Establish Care   HPI [name] presents for   Medication refill: Requests medication refills for ibuprofen and gabapentin. Recent history of ED visit for accidental drug overdose. History of chronic back pain with sciatica. Reports back pain on most days 4 out of 10 pain. She reports since taken neurontin no paresthesias.  History of bipolar: Denies any SI/HI. [facility] Declines speaking with LCSW at this time.   Outpatient Medications Prior to Visit  Medication Sig Dispense Refill  . Aspirin-Acetaminophen-Caffeine (GOODY HEADACHE PO) Take 1-2 packets by mouth every 6 (six) hours as needed (pain/ headache).    . diphenhydrAMINE (BENADRYL) 25 mg capsule Take 25-50 mg by mouth every 6 (six) hours as needed for sleep (anxiety).     Marland Kitchen. omeprazole (PRILOSEC OTC) 20 MG tablet Take 20 mg by mouth daily.    Marland Kitchen. gabapentin (NEURONTIN) 300 MG capsule Take 1 capsule (300 mg total) by mouth 2 (two) times daily. For numbness 60 capsule 2  . naproxen (NAPROSYN) 500 MG tablet Take 1 tablet (500 mg total) by mouth 2 (two) times daily with a meal. As needed for pain. 40 tablet 0  . alprazolam (XANAX) 2 MG tablet Take 2 mg by mouth 2 (two) times daily.    Marland Kitchen. amoxicillin (AMOXIL) 500 MG capsule Take 500 mg by mouth 4 (four) times daily.    Marland Kitchen. HYDROcodone-acetaminophen (NORCO/VICODIN) 5-325 MG tablet Take 1 tablet by mouth every 6 (six) hours as needed for moderate pain.    Marland Kitchen. ibuprofen (ADVIL,MOTRIN) 800 MG tablet Take 1 tablet (800 mg total) by mouth every 8 (eight) hours as needed for moderate pain or cramping. (Patient not taking: Reported on 06/05/2016) 30 tablet 0  . mirtazapine (REMERON) 15 MG tablet Take 15-30 mg by mouth daily.     . ondansetron (ZOFRAN) 4 MG tablet Take 1 tablet (4 mg total) by mouth every 6 (six) hours. (Patient not taking: Reported  on 04/05/2016) 12 tablet 0  . oxyCODONE-acetaminophen (PERCOCET) 5-325 MG tablet Take 1 tablet by mouth every 6 (six) hours as needed (pain from wisdom tootah removal).     . psyllium (METAMUCIL SMOOTH TEXTURE) 28 % packet Take 1 packet by mouth 2 (two) times daily. (Patient not taking: Reported on 06/05/2016) 60 packet 0   No facility-administered medications prior to visit.     ROS Review of Systems  Respiratory: Negative.   Cardiovascular: Negative.   Gastrointestinal: Negative.   Musculoskeletal: Positive for back pain (Chronic with sciatica).  Neurological: Negative.   Psychiatric/Behavioral: Negative for suicidal ideas.    Objective:  BP 104/67 (BP Location: Left Arm, Patient Position: Sitting, Cuff Size: Normal)   Pulse 89   Temp 98.3 F (36.8 C) (Oral)   Resp 18   Ht 5\' 2"  (1.575 m)   Wt 186 lb (84.4 kg)   SpO2 99%   BMI 34.02 kg/m   BP/Weight 06/05/2016 04/25/2016 04/05/2016  Systolic BP 104 102 107  Diastolic BP 67 67 74  Wt. (Lbs) 186 180 180.4  BMI 34.02 32.92 33  Some encounter information is confidential and restricted. Go to Review Flowsheets activity to see all data.   Physical Exam  Constitutional: She is oriented to person, place, and time.  Cardiovascular: Normal rate, regular rhythm, normal heart sounds and intact distal pulses.   Pulmonary/Chest: Effort normal and breath sounds normal.  Abdominal: Soft. Bowel sounds are normal.  Neurological: She is alert and oriented to person, place, and time. She has normal reflexes.  Psychiatric: She expresses no suicidal plans and no homicidal plans.  Nursing note and vitals reviewed.  Assessment & Plan:   Problem List Items Addressed This Visit      Other   Chronic midline low back pain - Primary   Relevant Medications   naproxen (NAPROSYN) 500 MG tablet   gabapentin (NEURONTIN) 300 MG capsule   Other Relevant Orders   Ambulatory referral to Orthopedics   Ambulatory referral to Physical Therapy       Meds ordered this encounter  Medications  . naproxen (NAPROSYN) 500 MG tablet    Sig: Take 1 tablet (500 mg total) by mouth 2 (two) times daily with a meal. As needed for pain.    Dispense:  40 tablet    Refill:  0    Order Specific Question:   Supervising Provider    Answer:   Quentin Angst [id number]  . gabapentin (NEURONTIN) 300 MG capsule    Sig: Take 1 capsule (300 mg total) by mouth 2 (two) times daily. For numbness    Dispense:  60 capsule    Refill:  2    Order Specific Question:   Supervising Provider    Answer:   Quentin Angst [id number]    Follow-up: Return As needed.  Lizbeth Bark FNP

## 2016-06-09 ENCOUNTER — Ambulatory Visit: Payer: Self-pay | Admitting: Neurology

## 2016-06-22 ENCOUNTER — Ambulatory Visit: Payer: Self-pay | Admitting: Neurology

## 2016-07-17 ENCOUNTER — Emergency Department (HOSPITAL_COMMUNITY)
Admission: EM | Admit: 2016-07-17 | Discharge: 2016-07-21 | Disposition: A | Discharge status: Home / Self Care | Payer: Self-pay | Attending: Emergency Medicine | Admitting: Emergency Medicine

## 2016-07-17 ENCOUNTER — Emergency Department (HOSPITAL_COMMUNITY): Payer: Self-pay

## 2016-07-17 ENCOUNTER — Encounter (HOSPITAL_COMMUNITY): Payer: Self-pay

## 2016-07-17 DIAGNOSIS — Z79899 Other long term (current) drug therapy: Secondary | ICD-10-CM | POA: Insufficient documentation

## 2016-07-17 DIAGNOSIS — F3110 Bipolar disorder, current episode manic without psychotic features, unspecified: Secondary | ICD-10-CM | POA: Diagnosis present

## 2016-07-17 DIAGNOSIS — F1120 Opioid dependence, uncomplicated: Secondary | ICD-10-CM | POA: Diagnosis present

## 2016-07-17 DIAGNOSIS — Z7982 Long term (current) use of aspirin: Secondary | ICD-10-CM | POA: Insufficient documentation

## 2016-07-17 DIAGNOSIS — T424X2A Poisoning by benzodiazepines, intentional self-harm, initial encounter: Secondary | ICD-10-CM | POA: Insufficient documentation

## 2016-07-17 DIAGNOSIS — F1920 Other psychoactive substance dependence, uncomplicated: Secondary | ICD-10-CM

## 2016-07-17 DIAGNOSIS — R45851 Suicidal ideations: Secondary | ICD-10-CM

## 2016-07-17 DIAGNOSIS — F17210 Nicotine dependence, cigarettes, uncomplicated: Secondary | ICD-10-CM | POA: Insufficient documentation

## 2016-07-17 DIAGNOSIS — T50904A Poisoning by unspecified drugs, medicaments and biological substances, undetermined, initial encounter: Secondary | ICD-10-CM

## 2016-07-17 LAB — COMPREHENSIVE METABOLIC PANEL
ALT: 15 U/L (ref 14–54)
ANION GAP: 7 (ref 5–15)
AST: 18 U/L (ref 15–41)
Albumin: 4 g/dL (ref 3.5–5.0)
Alkaline Phosphatase: 59 U/L (ref 38–126)
BUN: 8 mg/dL (ref 6–20)
CHLORIDE: 107 mmol/L (ref 101–111)
CO2: 22 mmol/L (ref 22–32)
Calcium: 8.7 mg/dL — ABNORMAL LOW (ref 8.9–10.3)
Creatinine, Ser: 0.61 mg/dL (ref 0.44–1.00)
GFR calc Af Amer: >60 mL/min (ref >60)
Glucose, Bld: 92 mg/dL (ref 65–99)
POTASSIUM: 3.1 mmol/L — AB (ref 3.5–5.1)
Sodium: 136 mmol/L (ref 135–145)
Total Bilirubin: 0.4 mg/dL (ref 0.3–1.2)
Total Protein: 7.4 g/dL (ref 6.5–8.1)

## 2016-07-17 LAB — CBC
HCT: 37.1 % (ref 36.0–46.0)
Hemoglobin: 12.9 g/dL (ref 12.0–15.0)
MCH: 32.7 pg (ref 26.0–34.0)
MCHC: 34.8 g/dL (ref 30.0–36.0)
MCV: 94.2 fL (ref 78.0–100.0)
PLATELETS: 371 10*3/uL (ref 150–400)
RBC: 3.94 MIL/uL (ref 3.87–5.11)
RDW: 13.5 % (ref 11.5–15.5)
WBC: 13 10*3/uL — AB (ref 4.0–10.5)

## 2016-07-17 LAB — ACETAMINOPHEN LEVEL: Acetaminophen (Tylenol), Serum: <10 ug/mL — ABNORMAL LOW (ref 10–30)

## 2016-07-17 LAB — RAPID URINE DRUG SCREEN, HOSP PERFORMED
AMPHETAMINES: NOT DETECTED
BENZODIAZEPINES: POSITIVE — AB
Barbiturates: NOT DETECTED
Cocaine: NOT DETECTED
OPIATES: NOT DETECTED
Tetrahydrocannabinol: POSITIVE — AB

## 2016-07-17 LAB — SALICYLATE LEVEL

## 2016-07-17 LAB — ETHANOL: ALCOHOL ETHYL (B): 33 mg/dL — AB (ref <5)

## 2016-07-17 MED ORDER — LORAZEPAM 1 MG PO TABS
1.0000 mg | ORAL_TABLET | Freq: Three times a day (TID) | ORAL | Status: DC | PRN
Start: 2016-07-17 — End: 2016-07-19
  Administered 2016-07-18 – 2016-07-19 (×3): 1 mg via ORAL
  Filled 2016-07-17 (×3): qty 1

## 2016-07-17 MED ORDER — ALBUTEROL SULFATE HFA 108 (90 BASE) MCG/ACT IN AERS
2.0000 | INHALATION_SPRAY | Freq: Once | RESPIRATORY_TRACT | Status: AC
  Administered 2016-07-17: 2 via RESPIRATORY_TRACT
  Filled 2016-07-17: qty 6.7

## 2016-07-17 MED ORDER — ALUM & MAG HYDROXIDE-SIMETH 200-200-20 MG/5ML PO SUSP: 30.0000 mL | ORAL | Status: DC | PRN

## 2016-07-17 MED ORDER — SERTRALINE HCL 50 MG PO TABS
25.0000 mg | ORAL_TABLET | Freq: Every day | ORAL | Status: DC
  Administered 2016-07-19: 25 mg via ORAL
  Filled 2016-07-17 (×2): qty 1

## 2016-07-17 MED ORDER — PANTOPRAZOLE SODIUM 40 MG PO TBEC
40.0000 mg | DELAYED_RELEASE_TABLET | Freq: Every day | ORAL | Status: DC
  Administered 2016-07-18 – 2016-07-21 (×4): 40 mg via ORAL
  Filled 2016-07-17 (×4): qty 1

## 2016-07-17 MED ORDER — LORAZEPAM 1 MG PO TABS: 0.0000 mg | ORAL_TABLET | Freq: Two times a day (BID) | ORAL | Status: DC

## 2016-07-17 MED ORDER — NICOTINE 21 MG/24HR TD PT24
21.0000 mg | MEDICATED_PATCH | Freq: Every day | TRANSDERMAL | Status: DC
  Administered 2016-07-18 – 2016-07-21 (×4): 21 mg via TRANSDERMAL
  Filled 2016-07-17 (×4): qty 1

## 2016-07-17 MED ORDER — ZIPRASIDONE HCL 20 MG PO CAPS
20.0000 mg | ORAL_CAPSULE | Freq: Two times a day (BID) | ORAL | Status: DC
  Administered 2016-07-18: 20 mg via ORAL
  Filled 2016-07-17: qty 1

## 2016-07-17 MED ORDER — ACETAMINOPHEN 325 MG PO TABS
650.0000 mg | ORAL_TABLET | ORAL | Status: DC | PRN
  Administered 2016-07-18: 650 mg via ORAL
  Filled 2016-07-17: qty 2

## 2016-07-17 MED ORDER — ONDANSETRON HCL 4 MG PO TABS
4.0000 mg | ORAL_TABLET | Freq: Three times a day (TID) | ORAL | Status: DC | PRN
  Administered 2016-07-20: 4 mg via ORAL
  Filled 2016-07-17: qty 1

## 2016-07-17 MED ORDER — IBUPROFEN 200 MG PO TABS
600.0000 mg | ORAL_TABLET | Freq: Three times a day (TID) | ORAL | Status: DC | PRN
  Administered 2016-07-18 – 2016-07-20 (×4): 600 mg via ORAL
  Filled 2016-07-17 (×4): qty 3

## 2016-07-17 MED ORDER — LORAZEPAM 1 MG PO TABS: 0.0000 mg | ORAL_TABLET | Freq: Four times a day (QID) | ORAL | Status: DC

## 2016-07-17 MED ORDER — GABAPENTIN 300 MG PO CAPS
300.0000 mg | ORAL_CAPSULE | Freq: Two times a day (BID) | ORAL | Status: DC
  Administered 2016-07-18 – 2016-07-19 (×3): 300 mg via ORAL
  Filled 2016-07-17 (×4): qty 1
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
## 2016-07-17 NOTE — ED Notes (Signed)
Patient transported to X-ray 

## 2016-07-17 NOTE — ED Triage Notes (Signed)
Patient here via [employer]- IVC.

## 2016-07-17 NOTE — ED Notes (Signed)
Bed: ZO10 Expected date:  Expected time:  Means of arrival:  Comments: [facility] with IVC

## 2016-07-17 NOTE — ED Notes (Signed)
Emergency commitment per [employer]

## 2016-07-17 NOTE — ED Notes (Signed)
Poison control contacted, will call back with lab results

## 2016-07-17 NOTE — ED Notes (Signed)
Contacted poison control with lab results. States nothing else is needed for patient and for [place] to call back if anything changes. States patients lab work is [facility]DL and they no longer need to follow her case

## 2016-07-18 MED ORDER — ZIPRASIDONE MESYLATE 20 MG IM SOLR
20.0000 mg | Freq: Once | INTRAMUSCULAR | Status: AC
  Administered 2016-07-18: 20 mg via INTRAMUSCULAR
  Filled 2016-07-18: qty 20

## 2016-07-18 MED ORDER — LORAZEPAM 2 MG/ML IJ SOLN
2.0000 mg | Freq: Once | INTRAMUSCULAR | Status: AC
  Administered 2016-07-18: 2 mg via INTRAMUSCULAR
  Filled 2016-07-18: qty 1

## 2016-07-18 MED ORDER — STERILE WATER FOR INJECTION IJ SOLN
INTRAMUSCULAR | Status: AC
  Filled 2016-07-18: qty 10

## 2016-07-18 MED ORDER — ALBUTEROL SULFATE HFA 108 (90 BASE) MCG/ACT IN AERS
2.0000 | INHALATION_SPRAY | Freq: Four times a day (QID) | RESPIRATORY_TRACT | Status: DC | PRN
  Administered 2016-07-19 – 2016-07-20 (×3): 2 via RESPIRATORY_TRACT
  Filled 2016-07-18: qty 6.7

## 2016-07-18 MED ORDER — LORAZEPAM 1 MG PO TABS: 2.0000 mg | ORAL_TABLET | Freq: Once | ORAL | Status: AC

## 2016-07-18 MED ORDER — DIPHENHYDRAMINE HCL 50 MG/ML IJ SOLN
50.0000 mg | Freq: Once | INTRAMUSCULAR | Status: AC
  Administered 2016-07-18: 50 mg via INTRAVENOUS
  Filled 2016-07-18: qty 1

## 2016-07-18 MED ORDER — OLANZAPINE 10 MG PO TABS
10.0000 mg | ORAL_TABLET | Freq: Two times a day (BID) | ORAL | Status: DC
  Administered 2016-07-19 – 2016-07-21 (×6): 10 mg via ORAL
  Filled 2016-07-18 (×6): qty 1

## 2016-07-18 MED ORDER — ALBUTEROL SULFATE HFA 108 (90 BASE) MCG/ACT IN AERS
2.0000 | INHALATION_SPRAY | RESPIRATORY_TRACT | Status: DC
  Filled 2016-07-18: qty 6.7

## 2016-07-18 MED ORDER — DIPHENHYDRAMINE HCL 50 MG/ML IJ SOLN
50.0000 mg | Freq: Once | INTRAMUSCULAR | Status: AC
  Administered 2016-07-18: 50 mg via INTRAMUSCULAR
  Filled 2016-07-18: qty 1

## 2016-07-18 MED ORDER — POTASSIUM CHLORIDE CRYS ER 20 MEQ PO TBCR
40.0000 meq | EXTENDED_RELEASE_TABLET | Freq: Two times a day (BID) | ORAL | Status: AC
  Administered 2016-07-18 (×2): 40 meq via ORAL
  Filled 2016-07-18 (×2): qty 2

## 2016-07-18 NOTE — ED Notes (Signed)
Pt resting at present, no distress noted, calm at present.  Monitoring for safety, Q 15 min checks in effect. 
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
## 2016-07-18 NOTE — ED Notes (Signed)
During morning rounds while introducing myself to patient she immediately acted hostile stating "It would be nice to get a nicotine patch,"  I told her I would work on that and she immediately started cursing and threatening to "Flip the fuck out"  She walked away and refused to participate in assessment.

## 2016-07-18 NOTE — BH Assessment (Signed)
[facility] Assessment Progress Note  Per Thedore Mins, MD, this pt requires psychiatric hospitalization at this time.  The following facilities have been contacted to seek placement for this pt, with results as noted:  Beds available, information sent, decision pending:  [facility]   At capacity:  [facility] [place], [place] Triage Specialist [phone]

## 2016-07-18 NOTE — ED Notes (Signed)
Pt got very angry after rounds and slammed her door shut and started cursing and yelling at all staff.  Pt also threw her tray across the room.  Security and [employer] present.

## 2016-07-19 DIAGNOSIS — F1990 Other psychoactive substance use, unspecified, uncomplicated: Secondary | ICD-10-CM

## 2016-07-19 DIAGNOSIS — F17210 Nicotine dependence, cigarettes, uncomplicated: Secondary | ICD-10-CM

## 2016-07-19 DIAGNOSIS — F1390 Sedative, hypnotic, or anxiolytic use, unspecified, uncomplicated: Secondary | ICD-10-CM

## 2016-07-19 DIAGNOSIS — T424X2A Poisoning by benzodiazepines, intentional self-harm, initial encounter: Secondary | ICD-10-CM

## 2016-07-19 DIAGNOSIS — T1491XA Suicide attempt, initial encounter: Secondary | ICD-10-CM

## 2016-07-19 DIAGNOSIS — F3110 Bipolar disorder, current episode manic without psychotic features, unspecified: Secondary | ICD-10-CM

## 2016-07-19 DIAGNOSIS — F1290 Cannabis use, unspecified, uncomplicated: Secondary | ICD-10-CM

## 2016-07-19 DIAGNOSIS — T426X2A Poisoning by other antiepileptic and sedative-hypnotic drugs, intentional self-harm, initial encounter: Secondary | ICD-10-CM

## 2016-07-19 MED ORDER — LORAZEPAM 2 MG/ML IJ SOLN
2.0000 mg | Freq: Once | INTRAMUSCULAR | Status: AC
  Administered 2016-07-19: 2 mg via INTRAMUSCULAR
  Filled 2016-07-19: qty 1

## 2016-07-19 MED ORDER — LORAZEPAM 1 MG PO TABS
2.0000 mg | ORAL_TABLET | Freq: Once | ORAL | Status: AC
  Administered 2016-07-19: 2 mg via ORAL
  Filled 2016-07-19: qty 2

## 2016-07-19 MED ORDER — ZIPRASIDONE MESYLATE 20 MG IM SOLR
20.0000 mg | Freq: Once | INTRAMUSCULAR | Status: AC
  Administered 2016-07-19: 20 mg via INTRAMUSCULAR
  Filled 2016-07-19: qty 20

## 2016-07-19 MED ORDER — DIPHENHYDRAMINE HCL 50 MG/ML IJ SOLN
50.0000 mg | Freq: Once | INTRAMUSCULAR | Status: AC
  Administered 2016-07-19: 50 mg via INTRAMUSCULAR
  Filled 2016-07-19: qty 1

## 2016-07-19 MED ORDER — DIVALPROEX SODIUM 500 MG PO DR TAB
500.0000 mg | DELAYED_RELEASE_TABLET | Freq: Two times a day (BID) | ORAL | Status: DC
  Administered 2016-07-19 – 2016-07-21 (×5): 500 mg via ORAL
  Filled 2016-07-19 (×5): qty 1

## 2016-07-19 MED ORDER — MAGIC MOUTHWASH W/LIDOCAINE
5.0000 mL | Freq: Three times a day (TID) | ORAL | Status: DC | PRN
  Administered 2016-07-19 – 2016-07-21 (×2): 5 mL via ORAL
  Filled 2016-07-19 (×2): qty 5

## 2016-07-19 MED ORDER — OLANZAPINE 10 MG PO TBDP
10.0000 mg | ORAL_TABLET | Freq: Three times a day (TID) | ORAL | Status: DC | PRN
  Administered 2016-07-21: 10 mg via ORAL
  Filled 2016-07-19: qty 1

## 2016-07-19 MED ORDER — PRAZOSIN HCL 2 MG PO CAPS
4.0000 mg | ORAL_CAPSULE | Freq: Every day | ORAL | Status: DC
  Administered 2016-07-19 – 2016-07-20 (×2): 4 mg via ORAL
  Filled 2016-07-19 (×2): qty 2
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
## 2016-07-19 NOTE — ED Notes (Signed)
Patient very loud and argumentative with another patient this morning.  She has also been argumentative with staff.  States she will be happy when all the day staff leaves for the day.  She has had one visitor to come in twice today.  Visitor had to be reminded to sit in a chair next to the bed.  Patient is not eating her trays stating she doesn't like the hospital food.  She has taken some snacks and is drinking fluids.

## 2016-07-19 NOTE — Progress Notes (Signed)
07/19/16 1339:  LRT went to pt room to offer activities, pt was with visitor.  1405:  Pt was in the hall talking to nurse and being loud.  Pt was becoming frustrated because nurse wouldn't give her a soda before snack time.  LRT talked pt into coming in the dayroom to do activities.  LRT and pt played checkers and then rummy.  Pt became calmer as the activities went on.  Once pt calmed down, pt was able to express her feelings.  Pt stated she hates when she gets upset like that but it's frustrating when staff aren't following the same rules.  LRT reassured pt that wasn't the norm.  Pt was fine after that and got soda at snack time.   Caroll Rancher, LRT/CTRS

## 2016-07-19 NOTE — ED Notes (Signed)
Pt A&O x 3, no distress noted, calm & cooperative at present.  Visiting with family at present.  Monitoring for safety, Q 15 min checks in effect.

## 2016-07-20 LAB — BASIC METABOLIC PANEL
ANION GAP: 8 (ref 5–15)
BUN: 16 mg/dL (ref 6–20)
CHLORIDE: 104 mmol/L (ref 101–111)
CO2: 22 mmol/L (ref 22–32)
CREATININE: 0.73 mg/dL (ref 0.44–1.00)
Calcium: 8.7 mg/dL — ABNORMAL LOW (ref 8.9–10.3)
GFR calc Af Amer: >60 mL/min (ref >60)
GFR calc non Af Amer: >60 mL/min (ref >60)
Glucose, Bld: 103 mg/dL — ABNORMAL HIGH (ref 65–99)
POTASSIUM: 4 mmol/L (ref 3.5–5.1)
Sodium: 134 mmol/L — ABNORMAL LOW (ref 135–145)

## 2016-07-20 MED ORDER — POTASSIUM CHLORIDE CRYS ER 20 MEQ PO TBCR
20.0000 meq | EXTENDED_RELEASE_TABLET | Freq: Once | ORAL | Status: AC
Start: 2016-07-20 — End: 2016-07-20
  Administered 2016-07-20: 20 meq via ORAL
  Filled 2016-07-20: qty 1

## 2016-07-20 MED ORDER — HYDROXYZINE HCL 25 MG PO TABS
50.0000 mg | ORAL_TABLET | Freq: Once | ORAL | Status: AC
  Administered 2016-07-20: 50 mg via ORAL
  Filled 2016-07-20: qty 2

## 2016-07-20 NOTE — Progress Notes (Signed)
07/20/16 1335:  Pt was with visitor.   Caroll Rancher, LRT/CTRS

## 2016-07-20 NOTE — BH Assessment (Signed)
[facility] Assessment Progress Note  Per Thedore Mins, MD, this pt continues to require psychiatric hospitalization at this time.  The following facilities have been contacted to seek placement for this pt, with results as noted:  Beds available, information sent, decision pending:  [facility] [place]   At capacity:  [facility]   Doylene Canning, [place] Triage Specialist [phone]

## 2016-07-20 NOTE — Consult Note (Signed)
[facility] Psych ED Progress Note  07/20/2016 11:05 AM [name]  MRN:  [id number] Subjective:  ''I have a lot going on, can't sleep and having nightmares.''  Objective: Patient was seen, interviewed and her chart was reviewed. Patient reports difficulty sleeping, worries, nightmares and mood swings. She remains easily agitated, labile, verbally aggressive and argumentative with her peers and staff.   Principal Problem: Bipolar I disorder, most recent episode (or current) manic ([facility]) Diagnosis:   Patient Active Problem List   Diagnosis Date Noted  . Polysubstance dependence including opioid type drug, episodic abuse ([facility]) [F11.20, F19.20] 06/03/2015    Priority: High  . Bipolar I disorder, most recent episode (or current) manic ([facility]) [F31.10] 02/25/2013    Priority: High  . Substance abuse/dependence [IMO0002] 02/21/2013    Priority: High  . Chronic midline low back pain [M54.5, G89.29] 05/29/2016  . Substance induced mood disorder ([facility]) [F19.94] 06/28/2015  . 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 Polysubstance dependence ([facility]) [F19.20] 06/06/2015  . Polysubstance abuse [F19.10] 11/01/2013  . Benzodiazepine dependence ([facility]) [F13.20] 11/01/2013   Total Time spent with patient: 30 minutes  Past Psychiatric History: as abobe  Past Medical History:  Past Medical History:  Diagnosis Date  . Anxiety   . Benzodiazepine abuse   . Bipolar 1 disorder ([facility])   . Depression   . Drug abuse   . ETOH abuse   . Polysubstance abuse    History reviewed. No pertinent surgical history. Family History: History reviewed. No pertinent family history. Family Psychiatric  History: Social History:  History  Alcohol Use  . 7.2 oz/week  . 12 Cans of beer per week    Comment: Per report; Pt denied current use; BAC clear     History  Drug Use  . Frequency: 7.0 times per week  . Types: Heroin, Benzodiazepines, Marijuana, IV    Comment: Per previous report; Pt  denied use in over a year; UDS not available    Social History   Social History  . Marital status: Single    Spouse name: N/A  . Number of children: N/A  . Years of education: N/A   Social History Main Topics  . Smoking status: Current Every Day Smoker    Packs/day: 2.00    Years: 17.00    Types: Cigarettes  . Smokeless tobacco: Never Used  . Alcohol use 7.2 oz/week    12 Cans of beer per week     Comment: Per report; Pt denied current use; BAC clear  . Drug use: Yes    Frequency: 7.0 times per week    Types: Heroin, Benzodiazepines, Marijuana, IV     Comment: Per previous report; Pt denied use in over a year; UDS not available  . Sexual activity: No   Other Topics Concern  . None   Social History Narrative  . None    Sleep: Poor  Appetite:  Fair  Current Medications: Current Facility-Administered Medications  Medication Dose Route Frequency Provider Last Rate Last Dose  . acetaminophen (TYLENOL) tablet 650 mg  650 mg Oral Q4H PRN Trixie Dredge, PA-C   650 mg at 07/18/16 1620  . albuterol (PROVENTIL HFA;VENTOLIN HFA) 108 (90 Base) MCG/ACT inhaler 2 puff  2 puff Inhalation QID PRN Arby Barrette, MD   2 puff at 07/19/16 1221  . alum & mag hydroxide-simeth (MAALOX/MYLANTA) 200-200-20 MG/5ML suspension 30 mL  30 mL Oral PRN Trixie Dredge, PA-C      . divalproex (DEPAKOTE) DR  tablet 500 mg  500 mg Oral BID Thedore Mins, MD   500 mg at 07/19/16 2110  . ibuprofen (ADVIL,MOTRIN) tablet 600 mg  600 mg Oral Q8H PRN Trixie Dredge, PA-C   600 mg at 07/20/16 0527  . magic mouthwash w/lidocaine  5 mL Oral TID PRN Beau Fanny, FNP   5 mL at 07/19/16 2111  . nicotine (NICODERM CQ - dosed in mg/24 hours) patch 21 mg  21 mg Transdermal Daily Trixie Dredge, PA-C   21 mg at 07/19/16 1610  . OLANZapine (ZYPREXA) tablet 10 mg  10 mg Oral BID Thedore Mins, MD   10 mg at 07/19/16 2110  . OLANZapine zydis (ZYPREXA) disintegrating tablet 10 mg  10 mg Oral Q8H PRN Thedore Mins, MD      .  ondansetron (ZOFRAN) tablet 4 mg  4 mg Oral Q8H PRN Trixie Dredge, PA-C      . pantoprazole (PROTONIX) EC tablet 40 mg  40 mg Oral Daily Butler, PA-C   40 mg at 07/19/16 9604  . potassium chloride SA (K-DUR,KLOR-CON) CR tablet 20 mEq  20 mEq Oral Once Thedore Mins, MD      . prazosin (MINIPRESS) capsule 4 mg  4 mg Oral QHS Thedore Mins, MD   4 mg at 07/19/16 2110   Current Outpatient Prescriptions  Medication Sig Dispense Refill  . alprazolam (XANAX) 2 MG tablet Take 2 mg by mouth 2 (two) times daily.    . Aspirin-Acetaminophen-Caffeine (GOODY HEADACHE PO) Take 1-2 packets by mouth every 6 (six) hours as needed (pain/ headache).    . diphenhydrAMINE (BENADRYL) 25 mg capsule Take 25-50 mg by mouth every 6 (six) hours as needed for sleep (anxiety).     . gabapentin (NEURONTIN) 300 MG capsule Take 1 capsule (300 mg total) by mouth 2 (two) times daily. For numbness 60 capsule 2  . hydrOXYzine (ATARAX/VISTARIL) 50 MG tablet Take 50 mg by mouth 3 (three) times daily.    Marland Kitchen ibuprofen (ADVIL,MOTRIN) 200 MG tablet Take 40-600 mg by mouth every 6 (six) hours as needed for moderate pain.    . naproxen (NAPROSYN) 500 MG tablet Take 1 tablet (500 mg total) by mouth 2 (two) times daily with a meal. As needed for pain. (Patient taking differently: Take 500 mg by mouth 2 (two) times daily as needed for mild pain. As needed for pain.) 40 tablet 0  . omeprazole (PRILOSEC OTC) 20 MG tablet Take 20 mg by mouth daily.    Marland Kitchen oxyCODONE-acetaminophen (PERCOCET) 5-325 MG tablet Take 1 tablet by mouth every 6 (six) hours as needed (pain from wisdom tootah removal).     . sertraline (ZOLOFT) 25 MG tablet Take 25 mg by mouth daily.    . ziprasidone (GEODON) 20 MG capsule Take 20 mg by mouth 2 (two) times daily with a meal.    . ibuprofen (ADVIL,MOTRIN) 800 MG tablet Take 1 tablet (800 mg total) by mouth every 8 (eight) hours as needed for moderate pain or cramping. (Patient not taking: Reported on 06/05/2016) 30 tablet 0   . ondansetron (ZOFRAN) 4 MG tablet Take 1 tablet (4 mg total) by mouth every 6 (six) hours. (Patient not taking: Reported on 04/05/2016) 12 tablet 0  . psyllium (METAMUCIL SMOOTH TEXTURE) 28 % packet Take 1 packet by mouth 2 (two) times daily. (Patient not taking: Reported on 06/05/2016) 60 packet 0    Lab Results: No results found for this or any previous visit (from the past 48 hour(s)).  Blood Alcohol level:  Lab Results  Component Value Date   ETH 33 (H) 07/17/2016   ETH <5 03/02/2016    Physical Findings: AIMS:  , ,  ,  ,    CIWA:  CIWA-Ar Total: 0 COWS:     Musculoskeletal: Strength & Muscle Tone: within normal limits Gait & Station: normal Patient leans: N/A  Psychiatric Specialty Exam: Physical Exam  Psychiatric: Her affect is angry and labile. Her speech is rapid and/or pressured. She is agitated. Cognition and memory are normal. She expresses impulsivity. She expresses suicidal ideation.    Review of Systems  Constitutional: Negative.   HENT: Negative.   Eyes: Negative.   Respiratory: Negative.   Cardiovascular: Negative.   Gastrointestinal: Negative.   Genitourinary: Negative.   Musculoskeletal: Negative.   Skin: Negative.   Neurological: Negative.   Endo/Heme/Allergies: Negative.   Psychiatric/Behavioral: Positive for substance abuse and suicidal ideas. The patient has insomnia.     Blood pressure 116/87, pulse (!) 102, temperature 97.6 F (36.4 C), temperature source Oral, resp. rate 18, height  (1.575 m), weight 86.2 kg (190 lb), last menstrual period 07/15/2016, SpO2 96 %.Body mass index is 34.75 kg/m.  General Appearance: Casual  Eye Contact:  Good  Speech:  Pressured  Volume:  Increased  Mood:  Irritable  Affect:  Labile  Thought Process:  Coherent  Orientation:  Full (Time, Place, and Person)  Thought Content:  Logical  Suicidal Thoughts:  No  Homicidal Thoughts:  No  Memory:  Immediate;   Fair Recent;   Fair Remote;   Fair  Judgement:   Poor  Insight:  Shallow  Psychomotor Activity:  Increased  Concentration:  Concentration: Fair and Attention Span: Fair  Recall:  [employer] of Knowledge:  Fair  Language:  Good  Akathisia:  No  Handed:  Right  AIMS (if indicated):     Assets:  Communication Skills  ADL's:  Intact  Cognition:  WNL  Sleep:   poor      Treatment Plan Summary: Daily contact with patient to assess and evaluate symptoms and progress in treatment and Medication management  Continue Olanzapine 10 mg bid and Depakote ER  bid for mood stabilization. Patient waiting for inpatient placement.  Thedore Mins, MD 07/20/2016, 11:05 AM

## 2016-07-20 NOTE — ED Notes (Signed)
Pt A&O x 3, no distress noted, calm & cooperative.  Visiting with family at present.  Monitoring for safety, Q 15 min checks in effect.

## 2016-07-20 NOTE — ED Notes (Signed)
Pts belongings have been move to hall closet.

## 2016-07-21 DIAGNOSIS — F1910 Other psychoactive substance abuse, uncomplicated: Secondary | ICD-10-CM

## 2016-07-21 DIAGNOSIS — F4310 Post-traumatic stress disorder, unspecified: Secondary | ICD-10-CM

## 2016-07-21 LAB — BASIC METABOLIC PANEL
Anion gap: 5 (ref 5–15)
BUN: 19 mg/dL (ref 6–20)
CALCIUM: 8.9 mg/dL (ref 8.9–10.3)
CHLORIDE: 107 mmol/L (ref 101–111)
CO2: 23 mmol/L (ref 22–32)
CREATININE: 0.71 mg/dL (ref 0.44–1.00)
GFR calc non Af Amer: >60 mL/min (ref >60)
GLUCOSE: 109 mg/dL — AB (ref 65–99)
Potassium: 4.1 mmol/L (ref 3.5–5.1)
Sodium: 135 mmol/L (ref 135–145)

## 2016-07-21 MED ORDER — DIVALPROEX SODIUM 500 MG PO DR TAB: 500.0000 mg | DELAYED_RELEASE_TABLET | Freq: Two times a day (BID) | ORAL | 0 refills | Status: DC

## 2016-07-21 MED ORDER — PRAZOSIN HCL 2 MG PO CAPS: 4.0000 mg | ORAL_CAPSULE | Freq: Every day | ORAL | 0 refills | Status: DC

## 2016-07-21 MED ORDER — OLANZAPINE 10 MG PO TABS: 10.0000 mg | ORAL_TABLET | Freq: Two times a day (BID) | ORAL | 0 refills | Status: DC

## 2016-07-21 NOTE — BH Assessment (Signed)
[facility] Assessment Progress Note  Per Thedore Mins, MD, this pt does not require psychiatric hospitalization at this time.  Pt presents under IVC initiated by law enforcement, which Dr Jannifer Franklin has rescinded.  Pt is to be discharged from [facility] with recommendation to follow up with [facility].  This has been included in pt's discharge instructions.  Pt's nurse, Wille Celeste, has been notified.  Doylene Canning, MA Triage Specialist [phone]

## 2016-07-21 NOTE — ED Notes (Signed)
Dr Akintyo and May NP into see 

## 2016-07-21 NOTE — Discharge Summary (Deleted)
Physician Discharge Summary Note  Patient:  [name] is an 36 y.o., female MRN:  [id number] DOB:  1981-01-27 Patient phone:  [phone] (home)  Patient address:   [address] [place] [address],  Total Time spent with patient: 30 minutes  Date of Admission:  07/17/2016 Date of Discharge: 07/21/2016  Reason for Admission:  overdose  Principal Problem: Bipolar I disorder, most recent episode (or current) manic [facility]) Discharge Diagnoses: Patient Active Problem List   Diagnosis Date Noted  . Chronic midline low back pain [M54.5, G89.29] 05/29/2016  . Substance induced mood disorder (HCC) [F19.94] 06/28/2015  . 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 Polysubstance dependence (HCC) [F19.20] 06/06/2015  . Polysubstance dependence including opioid type drug, episodic abuse (HCC) [F11.20, F19.20] 06/03/2015  . Polysubstance abuse [F19.10] 11/01/2013  . 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 Bipolar I disorder, most recent episode (or current) manic (HCC) [F31.10] 02/25/2013  . Substance abuse/dependence [IMO0002] 02/21/2013    Past Psychiatric History:  See HPI  Past Medical History:  Past Medical History:  Diagnosis Date  . Anxiety   . Benzodiazepine abuse   . Bipolar 1 disorder (HCC)   . Depression   . Drug abuse   . ETOH abuse   . Polysubstance abuse    History reviewed. No pertinent surgical history. Family History: History reviewed. No pertinent family history. Family Psychiatric  History:  See HPI Social History:  History  Alcohol Use  . 7.2 oz/week  . 12 Cans of beer per week    Comment: Per report; Pt denied current use; BAC clear     History  Drug Use  . Frequency: 7.0 times per week  . Types: Heroin, Benzodiazepines, Marijuana, IV    Comment: Per previous report; Pt denied use in over a year; UDS not available    Social History   Social History  . Marital status: Single    Spouse  name: N/A  . Number of children: N/A  . Years of education: N/A   Social History Main Topics  . Smoking status: Current Every Day Smoker    Packs/day: 2.00    Years: 17.00    Types: Cigarettes  . Smokeless tobacco: Never Used  . Alcohol use 7.2 oz/week    12 Cans of beer per week     Comment: Per report; Pt denied current use; BAC clear  . Drug use: Yes    Frequency: 7.0 times per week    Types: Heroin, Benzodiazepines, Marijuana, IV     Comment: Per previous report; Pt denied use in over a year; UDS not available  . Sexual activity: No   Other Topics Concern  . None   Social History Narrative  . None    Hospital Course:  [name], 36 yo female, reported history of Bipolar disorder, PTSD substance abuse. She was brought to the ED by police under involuntary commitment. Patient reports that she overdosed on Klonopin, Neurontin due to reason she refused to disclose. Patient reported history of self harming behavior by cutting, patient made several statements to the police called to the scene of her suicide attempt  that she wanted to harm herself and that she "does not want to live" and "fuck life." Patient is easily agitated, labile, verbally aggressive and has been destroying properties in her room. Patient abuses multiple drugs and she is positive for  benzodiazapines and cocaine.  Dr. Jannifer Franklin had seen the patient yesterday and advised the patient  to be compliant with meds and to follow unit rules.  No disruptive behaviors seen.  Per nursing, patient took medications.  Patient was alert, cooperative today.  She denies suicidal or homicidal ideations.  No paranoia c/o.  Patient states she is ready to go and follow up [facility] upon discharge.  Physical Findings: AIMS:  , ,  ,  ,    CIWA:  CIWA-Ar Total: 0 COWS:     Musculoskeletal: Strength & Muscle Tone: within normal limits Gait & Station: normal Patient leans: N/A  Psychiatric Specialty Exam: Physical Exam  Nursing  note and vitals reviewed.   ROS  Blood pressure (!) 106/56, pulse 81, temperature 98.7 F (37.1 C), temperature source Oral, resp. rate 17, height  (1.575 m), weight 86.2 kg (190 lb), last menstrual period 07/15/2016, SpO2 97 %.Body mass index is 34.75 kg/m.  General Appearance: Casual  Eye Contact:  Good  Speech:  Clear and Coherent  Volume:  Normal  Mood:  Euthymic  Affect:  Appropriate  Thought Process:  Coherent  Orientation:  Full (Time, Place, and Person)  Thought Content:  Logical  Suicidal Thoughts:  No  Homicidal Thoughts:  No  Memory:  Immediate;   Good Recent;   Good Remote;   Good  Judgement:  Good  Insight:  Good  Psychomotor Activity:  Normal  Concentration:  Concentration: Good and Attention Span: Good  Recall:  Good  Fund of Knowledge:  Good  Language:  Good  Akathisia:  No  Handed:  Right  AIMS (if indicated):     Assets:  Communication Skills Physical Health Resilience  ADL's:  Intact  Cognition:  WNL  Sleep:           Has this patient used any form of tobacco in the last 30 days? (Cigarettes, Smokeless Tobacco, Cigars, and/or Pipes) Yes, No  Blood Alcohol level:  Lab Results  Component Value Date   ETH 33 (H) 07/17/2016   ETH <5 03/02/2016    Metabolic Disorder Labs:  Lab Results  Component Value Date   HGBA1C 5.2 02/24/2016   No results found for: PROLACTIN No results found for: CHOL, TRIG, HDL, CHOLHDL, VLDL, LDLCALC  See Psychiatric Specialty Exam and Suicide Risk Assessment completed by Attending Physician prior to discharge.  Discharge destination:  Home  Is patient on multiple antipsychotic therapies at discharge:  No   Has Patient had three or more failed trials of antipsychotic monotherapy by history:  No  Recommended Plan for Multiple Antipsychotic Therapies: NA  Discharge Instructions    Diet - low sodium heart healthy    Complete by:  As directed    Increase activity slowly    Complete by:  As directed       Allergies as of 07/21/2016      Reactions   Haldol [haloperidol Lactate] Other (See Comments)   Makes whole body stiff      Medication List    STOP taking these medications   alprazolam 2 MG tablet Commonly known as:  XANAX   diphenhydrAMINE 25 mg capsule Commonly known as:  BENADRYL   GOODY HEADACHE PO   hydrOXYzine 50 MG tablet Commonly known as:  ATARAX/VISTARIL   ibuprofen 200 MG tablet Commonly known as:  ADVIL,MOTRIN   ibuprofen 800 MG tablet Commonly known as:  ADVIL,MOTRIN   naproxen 500 MG tablet Commonly known as:  NAPROSYN   omeprazole 20 MG tablet Commonly known as:  PRILOSEC OTC   ondansetron 4 MG tablet Commonly known  as:  ZOFRAN   PERCOCET 5-325 MG tablet Generic drug:  oxyCODONE-acetaminophen   psyllium 28 % packet Commonly known as:  METAMUCIL SMOOTH TEXTURE   sertraline 25 MG tablet Commonly known as:  ZOLOFT   ziprasidone 20 MG capsule Commonly known as:  GEODON     TAKE these medications     Indication  divalproex 500 MG DR tablet Commonly known as:  DEPAKOTE Take 1 tablet (500 mg total) by mouth 2 (two) times daily.  Indication:  Manic Phase of Manic-Depression   gabapentin 300 MG capsule Commonly known as:  NEURONTIN Take 1 capsule (300 mg total) by mouth 2 (two) times daily. For numbness  Indication:  Neuropathic Pain   OLANZapine 10 MG tablet Commonly known as:  ZYPREXA Take 1 tablet (10 mg total) by mouth 2 (two) times daily.  Indication:  Manic-Depression   prazosin 2 MG capsule Commonly known as:  MINIPRESS Take 2 capsules (4 mg total) by mouth at bedtime.  Indication:  nightmares        Follow-up recommendations:  Activity:  as tol Diet:  as tol  Comments:  1.  Take all your medications as prescribed.   2.  Report any adverse side effects to outpatient provider. 3.  Patient instructed to not use alcohol or illegal drugs while on prescription medicines. 4.  In the event of worsening symptoms, instructed  patient to call 911, the crisis hotline or go to nearest emergency room for evaluation of symptoms.  Signed: Lindwood Qua, NP [facility] 07/21/2016, 11:07 AM

## 2016-07-21 NOTE — ED Notes (Addendum)
Written dc instructions and prescriptions reviewed w/pt.  Pt denies si/hi/avh at this time.  Pt encouraged to take medications as directed follow up with [facility] and seek treatment for return of suicidal thoughts/urges.  Pt instructed to remove nicotine patch prior to smoking.  Patient verbalized understanding.  Pt ambulatory to dc area with mHt.

## 2016-07-21 NOTE — [facility] Suicide Risk Assessment (Cosign Needed)
Suicide Risk Assessment  Discharge Assessment   [facility] Discharge Suicide Risk Assessment   Principal Problem: Bipolar I disorder, most recent episode (or current) manic [facility]) Discharge Diagnoses:  Patient Active Problem List   Diagnosis Date Noted  . Chronic midline low back pain [M54.5, G89.29] 05/29/2016  . Substance induced mood disorder (HCC) [F19.94] 06/28/2015  . 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 Polysubstance dependence (HCC) [F19.20] 06/06/2015  . Polysubstance dependence including opioid type drug, episodic abuse (HCC) [F11.20, F19.20] 06/03/2015  . Polysubstance abuse [F19.10] 11/01/2013  . 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 Bipolar I disorder, most recent episode (or current) manic (HCC) [F31.10] 02/25/2013  . Substance abuse/dependence [IMO0002] 02/21/2013    Total Time spent with patient: 30 minutes  Musculoskeletal: Strength & Muscle Tone: within normal limits Gait & Station: normal Patient leans: N/A  Psychiatric Specialty Exam: Physical Exam  Nursing note and vitals reviewed.   ROS  Blood pressure (!) 106/56, pulse 81, temperature 98.7 F (37.1 C), temperature source Oral, resp. rate 17, height  (1.575 m), weight 86.2 kg (190 lb), last menstrual period 07/15/2016, SpO2 97 %.Body mass index is 34.75 kg/m.  General Appearance: Casual  Eye Contact:  Good  Speech:  Clear and Coherent  Volume:  Normal  Mood:  Euthymic  Affect:  Appropriate  Thought Process:  Coherent  Orientation:  Full (Time, Place, and Person)  Thought Content:  Logical  Suicidal Thoughts:  No  Homicidal Thoughts:  No  Memory:  Immediate;   Good Recent;   Good Remote;   Good  Judgement:  Good  Insight:  Good  Psychomotor Activity:  Normal  Concentration:  Concentration: Good and Attention Span: Good  Recall:  Good  Fund of Knowledge:  Good  Language:  Good  Akathisia:  No  Handed:  Right  AIMS (if indicated):     Assets:   Communication Skills Physical Health Resilience  ADL's:  Intact  Cognition:  WNL  Sleep:      Mental Status Per Nursing Assessment::   On Admission:     Demographic Factors:  Low socioeconomic status  Loss Factors: NA  Historical Factors: NA  Risk Reduction Factors:   Sense of responsibility to family  Continued Clinical Symptoms:  Alcohol/Substance Abuse/Dependencies  Cognitive Features That Contribute To Risk:  Thought constriction (tunnel vision)    Suicide Risk:  Minimal: No identifiable suicidal ideation.  Patients presenting with no risk factors but with morbid ruminations; may be classified as minimal risk based on the severity of the depressive symptoms    Plan Of Care/Follow-up recommendations:  Activity:  as tol Diet:  as Denise Jarvis May Chanel Mckesson, NP [facility] 07/21/2016, 11:28 AM

## 2016-07-21 NOTE — Discharge Instructions (Signed)
For your ongoing mental health needs, you are advised to follow up with [facility].  New and returning patients are seen at their walk-in clinic.  Walk-in hours are Monday - Friday from 8:00 am - 3:00 pm.  Walk-in patients are seen on a first come, first served basis.  Try to arrive as early as possible for he best chance of being seen the same day: ° °     [facility] °     [address] °     [place], [place] [address] °     [phone] °
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
## 2016-08-14 ENCOUNTER — Ambulatory Visit: Payer: Self-pay | Admitting: Family Medicine

## 2016-08-14 ENCOUNTER — Ambulatory Visit: Payer: Self-pay

## 2016-09-07 ENCOUNTER — Ambulatory Visit: Payer: Self-pay | Attending: Family Medicine

## 2016-10-19 ENCOUNTER — Emergency Department (HOSPITAL_BASED_OUTPATIENT_CLINIC_OR_DEPARTMENT_OTHER)
Admission: EM | Admit: 2016-10-19 | Discharge: 2016-10-19 | Disposition: A | Discharge status: Home / Self Care | Payer: Self-pay | Attending: Emergency Medicine | Admitting: Emergency Medicine

## 2016-10-19 ENCOUNTER — Encounter (HOSPITAL_BASED_OUTPATIENT_CLINIC_OR_DEPARTMENT_OTHER): Payer: Self-pay | Admitting: *Deleted

## 2016-10-19 DIAGNOSIS — F17210 Nicotine dependence, cigarettes, uncomplicated: Secondary | ICD-10-CM | POA: Insufficient documentation

## 2016-10-19 DIAGNOSIS — F12988 Cannabis use, unspecified with other cannabis-induced disorder: Secondary | ICD-10-CM | POA: Insufficient documentation

## 2016-10-19 DIAGNOSIS — R1116 Cannabis hyperemesis syndrome: Secondary | ICD-10-CM

## 2016-10-19 DIAGNOSIS — R112 Nausea with vomiting, unspecified: Secondary | ICD-10-CM

## 2016-10-19 DIAGNOSIS — Z79899 Other long term (current) drug therapy: Secondary | ICD-10-CM | POA: Insufficient documentation

## 2016-10-19 LAB — PREGNANCY, URINE: PREG TEST UR: NEGATIVE

## 2016-10-19 LAB — URINALYSIS, MICROSCOPIC (REFLEX)

## 2016-10-19 LAB — URINALYSIS, ROUTINE W REFLEX MICROSCOPIC
Glucose, UA: NEGATIVE mg/dL
KETONES UR: 15 mg/dL — AB
Leukocytes, UA: NEGATIVE
Nitrite: NEGATIVE
Protein, ur: NEGATIVE mg/dL
Specific Gravity, Urine: 1.029 (ref 1.005–1.030)
pH: 6 (ref 5.0–8.0)

## 2016-10-19 LAB — RAPID URINE DRUG SCREEN, HOSP PERFORMED
Amphetamines: NOT DETECTED
Barbiturates: NOT DETECTED
Benzodiazepines: POSITIVE — AB
Cocaine: NOT DETECTED
OPIATES: NOT DETECTED
Tetrahydrocannabinol: POSITIVE — AB

## 2016-10-19 MED ORDER — DICYCLOMINE HCL 20 MG PO TABS: 20.0000 mg | ORAL_TABLET | Freq: Two times a day (BID) | ORAL | 0 refills | Status: DC

## 2016-10-19 MED ORDER — DICYCLOMINE HCL 10 MG/ML IM SOLN
20.0000 mg | Freq: Once | INTRAMUSCULAR | Status: AC
  Administered 2016-10-19: 20 mg via INTRAMUSCULAR
  Filled 2016-10-19: qty 2

## 2016-10-19 MED ORDER — CAPSICUM OLEORESIN 0.025 % EX CREA
TOPICAL_CREAM | CUTANEOUS | Status: AC
  Administered 2016-10-19: 02:00:00
  Filled 2016-10-19: qty 60

## 2016-10-19 MED ORDER — HALOPERIDOL LACTATE 5 MG/ML IJ SOLN
INTRAMUSCULAR | Status: AC
  Filled 2016-10-19: qty 1

## 2016-10-19 MED ORDER — ONDANSETRON 8 MG PO TBDP: ORAL_TABLET | ORAL | 0 refills | Status: DC

## 2016-10-19 MED ORDER — ONDANSETRON 8 MG PO TBDP
ORAL_TABLET | ORAL | Status: AC
  Administered 2016-10-19: 02:00:00
  Filled 2016-10-19: qty 1

## 2016-10-19 MED ORDER — GI COCKTAIL ~~LOC~~
30.0000 mL | Freq: Once | ORAL | Status: AC
  Administered 2016-10-19: 30 mL via ORAL
  Filled 2016-10-19: qty 30

## 2016-10-19 MED ORDER — METOCLOPRAMIDE HCL 5 MG/ML IJ SOLN
INTRAMUSCULAR | Status: AC
  Administered 2016-10-19: 10 mg
  Filled 2016-10-19: qty 2

## 2016-10-19 NOTE — ED Notes (Signed)
Pt states that ginger ale did not make her vomit

## 2016-10-19 NOTE — ED Triage Notes (Signed)
Pt c/o diffuse add pain x 1 day with n/v

## 2016-10-19 NOTE — ED Provider Notes (Signed)
[facility] Provider Note   CSN: [id number] Arrival date & time: 10/19/16  0038     History   Chief Complaint Chief Complaint  Patient presents with  . Abdominal Pain    HPI [name] is a 36 y.o. female.  The history is provided by the patient.  Emesis   This is a recurrent problem. The current episode started 6 to 12 hours ago. The problem occurs 5 to 10 times per day. The problem has not changed since onset.The emesis has an appearance of stomach contents. There has been no fever. Pertinent negatives include no chills, no cough, no diarrhea, no fever, no headaches, no myalgias, no sweats and no URI. Risk factors: polysubstance abuse, including marijuana.    Past Medical History:  Diagnosis Date  . Anxiety   . Benzodiazepine abuse   . Bipolar 1 disorder ([facility])   . Depression   . Drug abuse   . ETOH abuse   . Polysubstance abuse     Patient Active Problem List   Diagnosis Date Noted  . Chronic midline low back pain 05/29/2016  . Substance induced mood disorder ([facility]) 06/28/2015  . 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 Polysubstance dependence ([facility]) 06/06/2015  . Polysubstance dependence including opioid type drug, episodic abuse ([facility]) 06/03/2015  . Polysubstance abuse 11/01/2013  . Benzodiazepine dependence ([facility]) 11/01/2013  . Bipolar I disorder, most recent episode (or current) manic ([facility]) 02/25/2013  . Substance abuse/dependence 02/21/2013    History reviewed. No pertinent surgical history.  OB History    No data available       Home Medications    Prior to Admission medications   Medication Sig Start Date End Date Taking? Authorizing Provider  alprazolam Prudy Feeler(XANAX) 2 MG tablet Take 2 mg by mouth 2 (two) times daily.    [provider]  Aspirin-Acetaminophen-Caffeine (GOODY HEADACHE PO) Take 1-2 packets by mouth every 6 (six) hours as needed (pain/ headache).    [provider]    diphenhydrAMINE (BENADRYL) 25 mg capsule Take 25-50 mg by mouth every 6 (six) hours as needed for sleep (anxiety).     [provider]  gabapentin (NEURONTIN) 300 MG capsule Take 1 capsule (300 mg total) by mouth 2 (two) times daily. For numbness 06/05/16   Arrie SenateHairston, Mandesia R, FNP  hydrOXYzine (ATARAX/VISTARIL) 50 MG tablet Take 50 mg by mouth 3 (three) times daily.    [provider]  ibuprofen (ADVIL,MOTRIN) 200 MG tablet Take 40-600 mg by mouth every 6 (six) hours as needed for moderate pain.    [provider]  ibuprofen (ADVIL,MOTRIN) 800 MG tablet Take 1 tablet (800 mg total) by mouth every 8 (eight) hours as needed for moderate pain or cramping. Patient not taking: Reported on 06/05/2016 05/29/16   Lizbeth BarkHairston, Mandesia R, FNP  naproxen (NAPROSYN) 500 MG tablet Take 1 tablet (500 mg total) by mouth 2 (two) times daily with a meal. As needed for pain. Patient taking differently: Take 500 mg by mouth 2 (two) times daily as needed for mild pain. As needed for pain. 06/05/16   Lizbeth BarkHairston, Mandesia R, FNP  OLANZapine (ZYPREXA) 10 MG tablet Take 1 tablet (10 mg total) by mouth 2 (two) times daily. 07/21/16   Adonis BrookAgustin, Sheila, NP  omeprazole (PRILOSEC OTC) 20 MG tablet Take 20 mg by mouth daily.    [provider]  ondansetron (ZOFRAN) 4 MG tablet Take 1 tablet (4 mg total) by mouth every 6 (six) hours. Patient  not taking: Reported on 04/05/2016 07/01/15   Bethel Born, PA-C  prazosin (MINIPRESS) 2 MG capsule Take 2 capsules (4 mg total) by mouth at bedtime. 07/21/16   Adonis Brook, NP  psyllium (METAMUCIL SMOOTH TEXTURE) 28 % packet Take 1 packet by mouth 2 (two) times daily. Patient not taking: Reported on 06/05/2016 04/11/16   Lizbeth Bark, FNP  sertraline (ZOLOFT) 25 MG tablet Take 25 mg by mouth daily.    [provider]  ziprasidone (GEODON) 20 MG capsule Take 20 mg by mouth 2 (two) times daily with a meal.    [provider]     Family History History reviewed. No pertinent family history.  Social History Social History  Substance Use Topics  . Smoking status: Current Every Day Smoker    Packs/day: 2.00    Years: 17.00    Types: Cigarettes  . Smokeless tobacco: Never Used  . Alcohol use 7.2 oz/week    12 Cans of beer per week     Comment: Per report; Pt denied current use; BAC clear     Allergies   Haldol [haloperidol lactate]   Review of Systems Review of Systems  Constitutional: Negative for chills and fever.  Respiratory: Negative for cough.   Gastrointestinal: Positive for nausea and vomiting. Negative for diarrhea.  Genitourinary: Negative for dysuria, flank pain and hematuria.  Musculoskeletal: Negative for myalgias.  Neurological: Negative for headaches.  All other systems reviewed and are negative.    Physical Exam Updated Vital Signs BP (!) 139/108   Pulse 78   Temp 98.3 F (36.8 C)   Resp 18   Ht 5\' 2"  (1.575 m)   Wt 90.7 kg (200 lb)   LMP 10/19/2016   SpO2 98%   BMI 36.58 kg/m   Physical Exam  Constitutional: She appears well-developed and well-nourished.  Appears under the influence of substances  HENT:  Head: Normocephalic and atraumatic.  Nose: Nose normal.  Mouth/Throat: Oropharynx is clear and moist. No oropharyngeal exudate.  Eyes: Conjunctivae and EOM are normal.  Neck: Normal range of motion. Neck supple.  Cardiovascular: Normal rate, regular rhythm, normal heart sounds and intact distal pulses.   Pulmonary/Chest: Effort normal and breath sounds normal. She has no wheezes. She has no rales.  Abdominal: Soft. Bowel sounds are normal. She exhibits no mass. There is no tenderness. There is no rigidity, no rebound, no guarding, no tenderness at McBurney's point and negative Murphy's sign.     ED Treatments / Results   Vitals:   10/19/16 0045  BP: (!) 139/108  Pulse: 78  Resp: 18  Temp: 98.3 F (36.8 C)    Labs (all labs ordered are listed, but  only abnormal results are displayed)  Results for orders placed or performed during the hospital encounter of 10/19/16  Pregnancy, urine  Result Value Ref Range   Preg Test, Ur NEGATIVE NEGATIVE  Urinalysis, Routine w reflex microscopic  Result Value Ref Range   Color, Urine AMBER (A) YELLOW   APPearance CLOUDY (A) CLEAR   Specific Gravity, Urine 1.029 1.005 - 1.030   pH 6.0 5.0 - 8.0   Glucose, UA NEGATIVE NEGATIVE mg/dL   Hgb urine dipstick SMALL (A) NEGATIVE   Bilirubin Urine SMALL (A) NEGATIVE   Ketones, ur 15 (A) NEGATIVE mg/dL   Protein, ur NEGATIVE NEGATIVE mg/dL   Nitrite NEGATIVE NEGATIVE   Leukocytes, UA NEGATIVE NEGATIVE  Rapid urine drug screen (hospital performed)  Result Value Ref Range   Opiates NONE  DETECTED NONE DETECTED   Cocaine NONE DETECTED NONE DETECTED   Benzodiazepines POSITIVE (A) NONE DETECTED   Amphetamines NONE DETECTED NONE DETECTED   Tetrahydrocannabinol POSITIVE (A) NONE DETECTED   Barbiturates NONE DETECTED NONE DETECTED  Urinalysis, Microscopic (reflex)  Result Value Ref Range   RBC / HPF 0-5 0 - 5 RBC/hpf   WBC, UA 0-5 0 - 5 WBC/hpf   Bacteria, UA RARE (A) NONE SEEN   Squamous Epithelial / LPF 0-5 (A) NONE SEEN   Amorphous Crystal PRESENT    No results found.   Procedures Procedures (including critical care time)  Medications Ordered in ED Medications  ondansetron (ZOFRAN-ODT) 8 MG disintegrating tablet (  Given 10/19/16 0200)  capsicum oleoresin (TRIXAICIN) 0.025 % cream (  Given 10/19/16 0200)  metoCLOPramide (REGLAN) 5 MG/ML injection (10 mg  Given 10/19/16 0236)  gi cocktail (Maalox,Lidocaine,Donnatal) (30 mLs Oral Given 10/19/16 0258)  dicyclomine (BENTYL) injection 20 mg (20 mg Intramuscular Given 10/19/16 0258)       Final Clinical Impressions(s) / ED Diagnoses  Patient is well hydrated on exam and has no abdominal tenderness on exam.  She appears quite sleepy on exam.  She has not vomited in the department and I do not believe  further labs or imaging are necessary at this time. 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  Given that she is already sleepy on my exam I will not be giving this patient phenergan or an additional benzodiazepine. Nor will I be prescribing any narcotics given her history of polysubstance abuse.   Return for  weakness, inability to tolerate oral medication, worsening pain, fevers, altered level of consciousness,drainage from lesion, bleeding or any concerns. It is not a brown recluse bite.    The patient is nontoxic-appearing on exam and vital signs are within normal limits.   I have reviewed the triage vital signs and the nursing notes. Pertinent labs &imaging results that were available during my care of the patient were reviewed by me and considered in my medical decision making (see chart for details).  After history, exam, and medical workup I feel the patient has been appropriately medically screened and is safe for discharge home. Pertinent diagnoses were discussed with the patient. Patient was given return precautions.        Qu[employer]avis [employer], MD 10/19/16 [phone]

## 2016-11-21 ENCOUNTER — Emergency Department (HOSPITAL_COMMUNITY): Payer: No Typology Code available for payment source

## 2016-11-21 ENCOUNTER — Emergency Department (HOSPITAL_COMMUNITY)
Admission: EM | Admit: 2016-11-21 | Discharge: 2016-11-22 | Disposition: A | Discharge status: Home / Self Care | Payer: No Typology Code available for payment source | Attending: Emergency Medicine | Admitting: Emergency Medicine

## 2016-11-21 ENCOUNTER — Encounter (HOSPITAL_COMMUNITY): Payer: Self-pay | Admitting: Emergency Medicine

## 2016-11-21 DIAGNOSIS — F3110 Bipolar disorder, current episode manic without psychotic features, unspecified: Secondary | ICD-10-CM | POA: Diagnosis present

## 2016-11-21 DIAGNOSIS — Y929 Unspecified place or not applicable: Secondary | ICD-10-CM | POA: Diagnosis not present

## 2016-11-21 DIAGNOSIS — F319 Bipolar disorder, unspecified: Secondary | ICD-10-CM | POA: Insufficient documentation

## 2016-11-21 DIAGNOSIS — F17210 Nicotine dependence, cigarettes, uncomplicated: Secondary | ICD-10-CM | POA: Diagnosis not present

## 2016-11-21 DIAGNOSIS — S0502XA Injury of conjunctiva and corneal abrasion without foreign body, left eye, initial encounter: Secondary | ICD-10-CM | POA: Insufficient documentation

## 2016-11-21 DIAGNOSIS — F919 Conduct disorder, unspecified: Secondary | ICD-10-CM | POA: Insufficient documentation

## 2016-11-21 DIAGNOSIS — F6381 Intermittent explosive disorder: Secondary | ICD-10-CM

## 2016-11-21 DIAGNOSIS — Y999 Unspecified external cause status: Secondary | ICD-10-CM | POA: Insufficient documentation

## 2016-11-21 DIAGNOSIS — Y939 Activity, unspecified: Secondary | ICD-10-CM | POA: Diagnosis not present

## 2016-11-21 DIAGNOSIS — Z79899 Other long term (current) drug therapy: Secondary | ICD-10-CM | POA: Insufficient documentation

## 2016-11-21 DIAGNOSIS — Z046 Encounter for general psychiatric examination, requested by authority: Secondary | ICD-10-CM | POA: Diagnosis present

## 2016-11-21 LAB — COMPREHENSIVE METABOLIC PANEL
ALT: 15 U/L (ref 14–54)
ANION GAP: 8 (ref 5–15)
AST: 18 U/L (ref 15–41)
Albumin: 4.3 g/dL (ref 3.5–5.0)
Alkaline Phosphatase: 54 U/L (ref 38–126)
BILIRUBIN TOTAL: 0.5 mg/dL (ref 0.3–1.2)
BUN: 11 mg/dL (ref 6–20)
CO2: 21 mmol/L — AB (ref 22–32)
Calcium: 8.9 mg/dL (ref 8.9–10.3)
Chloride: 111 mmol/L (ref 101–111)
Creatinine, Ser: 0.55 mg/dL (ref 0.44–1.00)
GLUCOSE: 87 mg/dL (ref 65–99)
POTASSIUM: 4 mmol/L (ref 3.5–5.1)
Sodium: 140 mmol/L (ref 135–145)
Total Protein: 7.4 g/dL (ref 6.5–8.1)

## 2016-11-21 LAB — ETHANOL: Alcohol, Ethyl (B): <5 mg/dL (ref <5)

## 2016-11-21 LAB — CBC
HEMATOCRIT: 36.5 % (ref 36.0–46.0)
HEMOGLOBIN: 12.8 g/dL (ref 12.0–15.0)
MCH: 32.2 pg (ref 26.0–34.0)
MCHC: 35.1 g/dL (ref 30.0–36.0)
MCV: 91.7 fL (ref 78.0–100.0)
Platelets: 394 10*3/uL (ref 150–400)
RBC: 3.98 MIL/uL (ref 3.87–5.11)
RDW: 13.2 % (ref 11.5–15.5)
WBC: 11 10*3/uL — AB (ref 4.0–10.5)

## 2016-11-21 LAB — SALICYLATE LEVEL

## 2016-11-21 LAB — CARBAMAZEPINE LEVEL, TOTAL

## 2016-11-21 LAB — ACETAMINOPHEN LEVEL: Acetaminophen (Tylenol), Serum: <10 ug/mL — ABNORMAL LOW (ref 10–30)

## 2016-11-21 MED ORDER — TETRACAINE HCL 0.5 % OP SOLN
2.0000 [drp] | Freq: Once | OPHTHALMIC | Status: AC
  Administered 2016-11-21: 2 [drp] via OPHTHALMIC
  Filled 2016-11-21: qty 4

## 2016-11-21 MED ORDER — HYDROXYZINE HCL 25 MG PO TABS: 25.0000 mg | ORAL_TABLET | Freq: Four times a day (QID) | ORAL | Status: DC | PRN

## 2016-11-21 MED ORDER — ERYTHROMYCIN 5 MG/GM OP OINT
TOPICAL_OINTMENT | Freq: Three times a day (TID) | OPHTHALMIC | Status: DC
  Administered 2016-11-21 – 2016-11-22 (×3): via OPHTHALMIC
  Filled 2016-11-21: qty 1

## 2016-11-21 MED ORDER — TRAZODONE HCL 100 MG PO TABS
100.0000 mg | ORAL_TABLET | Freq: Every evening | ORAL | Status: DC | PRN
  Administered 2016-11-21: 100 mg via ORAL
  Filled 2016-11-21: qty 1

## 2016-11-21 MED ORDER — KETOROLAC TROMETHAMINE 0.5 % OP SOLN
1.0000 [drp] | Freq: Four times a day (QID) | OPHTHALMIC | Status: DC
  Administered 2016-11-21 – 2016-11-22 (×4): 1 [drp] via OPHTHALMIC
  Filled 2016-11-21: qty 5

## 2016-11-21 MED ORDER — CARBAMAZEPINE 200 MG PO TABS
200.0000 mg | ORAL_TABLET | Freq: Three times a day (TID) | ORAL | Status: DC
  Administered 2016-11-21 – 2016-11-22 (×3): 200 mg via ORAL
  Filled 2016-11-21 (×3): qty 1

## 2016-11-21 MED ORDER — FLUORESCEIN SODIUM 0.6 MG OP STRP
1.0000 | ORAL_STRIP | Freq: Once | OPHTHALMIC | Status: AC
  Administered 2016-11-21: 1 via OPHTHALMIC
  Filled 2016-11-21: qty 1

## 2016-11-21 NOTE — ED Notes (Signed)
Mother Jewel BaizeJudy Mcenery requesting to visit with patient; informed no visitors at this time; mother states pt has been sending her texts stating she won't see her again and the mother won't have to worry about her any more; mother states [facility] was [facility] after being "catatonic". Mother can be reached at [phone]; home [phone]
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
## 2016-11-21 NOTE — ED Notes (Signed)
Pt sleeping at present, no distress noted, calm & cooperative at present.  Monitoring for safety, Q 15 min checks in effect. 
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
## 2016-11-21 NOTE — ED Notes (Signed)
This Clinical research associatewriter overheard [employer] officer stating they would give the Pt a copy of IVC paperwork.  This Clinical research associatewriter informed them that it is [facility] policy not to provide the Pt with the petitioner's name and paperwork.   Per TCU NT, [employer] officer provided Pt with a copy of paperwork and petitioner's name.
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
## 2016-11-21 NOTE — ED Notes (Signed)
Pt was instructed to give urine sample .  Pt is resting quietly .

## 2016-11-21 NOTE — ED Notes (Signed)
ED Provider at bedside. 

## 2016-11-21 NOTE — ED Notes (Signed)
Pt oriented to room and unit.  Pt is extremely irritable.  She denies S/I, H/I, and AVH.  Pt instructed of unit rules.  15 minute checks and video monitoring in place.

## 2016-11-21 NOTE — ED Notes (Signed)
Belongings placed in locker 28 
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
## 2016-11-21 NOTE — ED Notes (Signed)
Patient screaming, "I can't take this sh__ much longer."  "This f_ing shit hurts."
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
## 2016-11-22 DIAGNOSIS — F17210 Nicotine dependence, cigarettes, uncomplicated: Secondary | ICD-10-CM

## 2016-11-22 DIAGNOSIS — F419 Anxiety disorder, unspecified: Secondary | ICD-10-CM

## 2016-11-22 DIAGNOSIS — F6381 Intermittent explosive disorder: Secondary | ICD-10-CM

## 2016-11-22 DIAGNOSIS — F1311 Sedative, hypnotic or anxiolytic abuse, in remission: Secondary | ICD-10-CM

## 2016-11-22 DIAGNOSIS — F1111 Opioid abuse, in remission: Secondary | ICD-10-CM

## 2016-11-22 DIAGNOSIS — F3110 Bipolar disorder, current episode manic without psychotic features, unspecified: Secondary | ICD-10-CM

## 2016-11-22 DIAGNOSIS — F1211 Cannabis abuse, in remission: Secondary | ICD-10-CM

## 2016-11-22 DIAGNOSIS — F1910 Other psychoactive substance abuse, uncomplicated: Secondary | ICD-10-CM

## 2016-11-22 MED ORDER — OLANZAPINE 10 MG PO TABS
10.0000 mg | ORAL_TABLET | Freq: Two times a day (BID) | ORAL | Status: DC
  Filled 2016-11-22: qty 1

## 2016-11-22 MED ORDER — PRAZOSIN HCL 1 MG PO CAPS: 4.0000 mg | ORAL_CAPSULE | Freq: Every day | ORAL | Status: DC

## 2016-11-22 MED ORDER — GABAPENTIN 300 MG PO CAPS
300.0000 mg | ORAL_CAPSULE | Freq: Two times a day (BID) | ORAL | Status: DC
  Administered 2016-11-22: 300 mg via ORAL
  Filled 2016-11-22: qty 1

## 2016-11-22 MED ORDER — ACETAMINOPHEN 325 MG PO TABS
650.0000 mg | ORAL_TABLET | Freq: Four times a day (QID) | ORAL | Status: DC | PRN
  Administered 2016-11-22: 650 mg via ORAL
  Filled 2016-11-22: qty 2

## 2016-11-22 MED ORDER — HYDROXYZINE HCL 25 MG PO TABS
50.0000 mg | ORAL_TABLET | Freq: Three times a day (TID) | ORAL | Status: DC
  Administered 2016-11-22: 50 mg via ORAL
  Filled 2016-11-22: qty 2

## 2016-11-22 NOTE — ED Notes (Addendum)
Pt denies si/hi/avh on dc. Written dc instructions and medications reviewed with patient. Pt encouraged to take medications as directed and follow up with [facility].  Pt also encouraged to contact opthalmologist today to schedule her follow up.  Pt encouraged to seek treatment for changes in vision, drainage, changes in eye pain, or other concerns.  Pt verbalized understanding.  Pt ambulatory to dc area with mHt, belongings returned after leaving the area.

## 2016-11-22 NOTE — ED Notes (Signed)
On the phone 

## 2016-11-22 NOTE — ED Notes (Signed)
Up   To the bathroom 

## 2016-11-22 NOTE — ED Notes (Signed)
May send eye medications home with patient per EDP.  EDP will eval prior to dc for instructions

## 2016-11-22 NOTE — ED Notes (Signed)
Pt up to the bathroom and declined to give urine sample.

## 2016-11-22 NOTE — Consult Note (Signed)
[facility] Consult   Reason for Consult:  Involuntary Committment Referring Physician:  EDP Patient Identification: [name] MRN:  [id number] Principal Diagnosis: Bipolar I disorder, most recent episode (or current) manic ([place]) Diagnosis:   Patient Active Problem List   Diagnosis Date Noted  . Polysubstance abuse [F19.10] 11/01/2013    Priority: High  . Intermittent explosive disorder [F63.81] 11/22/2016  . Chronic midline low back pain [M54.5, G89.29] 05/29/2016  . Substance induced mood disorder ([place]) [F19.94] 06/28/2015  . Major depressive disorder, recurrent, mild ([place]) [F33.0] 06/06/2015  . Polysubstance dependence ([place]) [F19.20] 06/06/2015  . Polysubstance dependence including opioid type drug, episodic abuse ([place]) [F11.20, F19.20] 06/03/2015  . 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 Bipolar I disorder, most recent episode (or current) manic ([place]) [F31.10] 02/25/2013  . Substance abuse/dependence [IMO0002] 02/21/2013    Total Time spent with patient: 30 minutes  Subjective:   [name] is a 36 y.o. female patient admitted under involuntary commitment, per IVC,  for making suicidal and threatening statements.  Marland Kitchen  HPI:  Pt was seen by Dr Darleene Cleaver and this clinician.  Pt denies suicidal/homicidal ideation, denies auditory/visual hallucinations and does not appear to be responding to internal stimuli. Pt was calm and cooperative, alert & oriented x 4, and appropriate for the situation. Pt stated she lives with a roommate and needs to get home to take care of her animals. Pt stated she was in the car with her mother waiting to come to the emergency room about her eye and instead had her mother had her IVC'd for supposedly making threatening and suicidal statements. Pt appears stable and is psychiatrically cleared for discharge.   Past Psychiatric History: MDD, IED, Benzodiazepine dependence  Risk to Self: None Risk to Others:  None Prior Inpatient Therapy: Prior Inpatient Therapy: Yes Prior Therapy Dates:  (released from [facility] 1 week ago; [facility]-3x's) Prior Therapy Facilty/Provider(s):  ([facility] Reason for Treatment:  (substance abse and suicide attempts ) Prior Outpatient Therapy: Prior Outpatient Therapy: Yes Prior Therapy Dates:  (current ) Prior Therapy Facilty/Provider(s):  Consulting civil engineer ) Reason for Treatment:  (n/a) Does patient have an ACCT team?: Yes Does patient have Intensive In-House Services?  : No Does patient have [facility] services? : No Does patient have [facility] services?: No  Past Medical History:  Past Medical History:  Diagnosis Date  . Anxiety   . Benzodiazepine abuse   . Bipolar 1 disorder ([place])   . Depression   . Drug abuse   . ETOH abuse   . Polysubstance abuse    History reviewed. No pertinent surgical history. Family History: No family history on file. Family Psychiatric  History: Unknown Social History:  History  Alcohol Use  . 7.2 oz/week  . 12 Cans of beer per week    Comment: Per report; Pt denied current use; BAC clear     History  Drug Use  . Frequency: 7.0 times per week  . Types: Heroin, Benzodiazepines, Marijuana, IV    Comment: Per previous report; Pt denied use in over a year; UDS not available    Social History   Social History  . Marital status: Single    Spouse name: N/A  . Number of children: N/A  . Years of education: N/A   Social History Main Topics  . Smoking status: Current Every Day Smoker    Packs/day: 2.00    Years: 17.00    Types: Cigarettes  . Smokeless tobacco: Never Used  .  Alcohol use 7.2 oz/week    12 Cans of beer per week     Comment: Per report; Pt denied current use; BAC clear  . Drug use: Yes    Frequency: 7.0 times per week    Types: Heroin, Benzodiazepines, Marijuana, IV     Comment: Per previous report; Pt denied use in over a year; UDS not available  . Sexual activity: No   Other Topics Concern  . None   Social  History Narrative  . None   Additional Social History:    Allergies:   Allergies  Allergen Reactions  . Haldol [Haloperidol Lactate] Other (See Comments)    Makes whole body stiff    Labs:  Results for orders placed or performed during the hospital encounter of 11/21/16 (from the past 48 hour(s))  Comprehensive metabolic panel     Status: Abnormal   Collection Time: 11/21/16 12:20 PM  Result Value Ref Range   Sodium 140 135 - 145 mmol/L   Potassium 4.0 3.5 - 5.1 mmol/L   Chloride 111 101 - 111 mmol/L   CO2 21 (L) 22 - 32 mmol/L   Glucose, Bld 87 65 - 99 mg/dL   BUN 11 6 - 20 mg/dL   Creatinine, Ser 0.55 0.44 - 1.00 mg/dL   Calcium 8.9 8.9 - 10.3 mg/dL   Total Protein 7.4 6.5 - 8.1 g/dL   Albumin 4.3 3.5 - 5.0 g/dL   AST 18 15 - 41 U/L   ALT 15 14 - 54 U/L   Alkaline Phosphatase 54 38 - 126 U/L   Total Bilirubin 0.5 0.3 - 1.2 mg/dL   GFR calc non Af Amer >60 >60 mL/min   GFR calc Af Amer >60 >60 mL/min    Comment: (NOTE) The eGFR has been calculated using the CKD EPI equation. This calculation has not been validated in all clinical situations. eGFR's persistently <60 mL/min signify possible Chronic Kidney Disease.    Anion gap 8 5 - 15  Ethanol     Status: None   Collection Time: 11/21/16 12:20 PM  Result Value Ref Range   Alcohol, Ethyl (B) <5 <5 mg/dL    Comment:        LOWEST DETECTABLE LIMIT FOR SERUM ALCOHOL IS 5 mg/dL FOR MEDICAL PURPOSES ONLY   Salicylate level     Status: None   Collection Time: 11/21/16 12:20 PM  Result Value Ref Range   Salicylate Lvl <5.1 2.8 - 30.0 mg/dL  Acetaminophen level     Status: Abnormal   Collection Time: 11/21/16 12:20 PM  Result Value Ref Range   Acetaminophen (Tylenol), Serum <10 (L) 10 - 30 ug/mL    Comment:        THERAPEUTIC CONCENTRATIONS VARY SIGNIFICANTLY. A RANGE OF 10-30 ug/mL MAY BE AN EFFECTIVE CONCENTRATION FOR MANY PATIENTS. HOWEVER, SOME ARE BEST TREATED AT CONCENTRATIONS OUTSIDE  THIS RANGE. ACETAMINOPHEN CONCENTRATIONS >150 ug/mL AT 4 HOURS AFTER INGESTION AND >50 ug/mL AT 12 HOURS AFTER INGESTION ARE OFTEN ASSOCIATED WITH TOXIC REACTIONS.   cbc     Status: Abnormal   Collection Time: 11/21/16 12:20 PM  Result Value Ref Range   WBC 11.0 (H) 4.0 - 10.5 K/uL   RBC 3.98 3.87 - 5.11 MIL/uL   Hemoglobin 12.8 12.0 - 15.0 g/dL   HCT 36.5 36.0 - 46.0 %   MCV 91.7 78.0 - 100.0 fL   MCH 32.2 26.0 - 34.0 pg   MCHC 35.1 30.0 - 36.0 g/dL   RDW 13.2  11.5 - 15.5 %   Platelets 394 150 - 400 K/uL  Carbamazepine level, total     Status: Abnormal   Collection Time: 11/21/16 12:20 PM  Result Value Ref Range   Carbamazepine Lvl <2.0 (L) 4.0 - 12.0 ug/mL    Comment: Performed at [facility] [address], [place], [place] [address]    Current Facility-Administered Medications  Medication Dose Route Frequency Provider Last Rate Last Dose  . acetaminophen (TYLENOL) tablet 650 mg  650 mg Oral Q6H PRN Ethelene Hal, NP   650 mg at 11/22/16 0904  . carbamazepine (TEGRETOL) tablet 200 mg  200 mg Oral TID Davonna Belling, MD   200 mg at 11/22/16 0905  . erythromycin ophthalmic ointment   Left Eye Q8H Davonna Belling, MD      . gabapentin (NEURONTIN) capsule 300 mg  300 mg Oral BID Breea Loncar, MD      . hydrOXYzine (ATARAX/VISTARIL) tablet 25 mg  25 mg Oral Q6H PRN Ethelene Hal, NP      . hydrOXYzine (ATARAX/VISTARIL) tablet 50 mg  50 mg Oral TID Corena Pilgrim, MD      . ketorolac (ACULAR) 0.5 % ophthalmic solution 1 drop  1 drop Left Eye QID Davonna Belling, MD   1 drop at 11/22/16 1030  . OLANZapine (ZYPREXA) tablet 10 mg  10 mg Oral BID [employer]hen [employer], MD      . prazosin (MINIPRESS) capsule 4 mg  4 mg Oral QHS Zakee Deerman, MD      . traZODone (DESYREL) tablet 100 mg  100 mg Oral QHS PRN Ethelene Hal, NP   100 mg at 11/21/16 2138   Current Outpatient Prescriptions  Medication Sig Dispense Refill  . alprazolam (XANAX) 2  MG tablet Take 2 mg by mouth 2 (two) times daily.    . Aspirin-Acetaminophen-Caffeine (GOODY HEADACHE PO) Take 1-2 packets by mouth every 6 (six) hours as needed (pain/ headache).    . dicyclomine (BENTYL) 20 MG tablet Take 1 tablet (20 mg total) by mouth 2 (two) times daily. 8 tablet 0  . diphenhydrAMINE (BENADRYL) 25 mg capsule Take 25-50 mg by mouth every 6 (six) hours as needed for sleep (anxiety).     . gabapentin (NEURONTIN) 300 MG capsule Take 1 capsule (300 mg total) by mouth 2 (two) times daily. For numbness 60 capsule 2  . hydrOXYzine (ATARAX/VISTARIL) 50 MG tablet Take 50 mg by mouth 3 (three) times daily.    Marland Kitchen ibuprofen (ADVIL,MOTRIN) 200 MG tablet Take 600 mg by mouth every 6 (six) hours as needed for moderate pain.     . naproxen (NAPROSYN) 500 MG tablet Take 1 tablet (500 mg total) by mouth 2 (two) times daily with a meal. As needed for pain. (Patient taking differently: Take 500 mg by mouth 2 (two) times daily as needed for mild pain. As needed for pain.) 40 tablet 0  . omeprazole (PRILOSEC OTC) 20 MG tablet Take 20 mg by mouth daily.    . ondansetron (ZOFRAN ODT) 8 MG disintegrating tablet 26m ODT q8 hours prn nausea 4 tablet 0  . prazosin (MINIPRESS) 2 MG capsule Take 2 capsules (4 mg total) by mouth at bedtime. 60 capsule 0  . ibuprofen (ADVIL,MOTRIN) 800 MG tablet Take 1 tablet (800 mg total) by mouth every 8 (eight) hours as needed for moderate pain or cramping. (Patient not taking: Reported on 06/05/2016) 30 tablet 0  . OLANZapine (ZYPREXA) 10 MG tablet Take 1 tablet (10 mg  total) by mouth 2 (two) times daily. (Patient not taking: Reported on 11/21/2016) 60 tablet 0  . psyllium (METAMUCIL SMOOTH TEXTURE) 28 % packet Take 1 packet by mouth 2 (two) times daily. (Patient not taking: Reported on 06/05/2016) 60 packet 0    Musculoskeletal: Strength & Muscle Tone: within normal limits Gait & Station: normal Patient leans: N/A  Psychiatric Specialty Exam: Physical Exam   Constitutional: She is oriented to person, place, and time. She appears well-developed and well-nourished.  Respiratory: Effort normal.  Musculoskeletal: Normal range of motion.  Neurological: She is alert and oriented to person, place, and time.    Review of Systems  Psychiatric/Behavioral: Positive for depression and substance abuse. Negative for hallucinations, memory loss and suicidal ideas. The patient is nervous/anxious. The patient does not have insomnia.   All other systems reviewed and are negative.   Blood pressure 105/75, pulse 87, temperature 98 F (36.7 C), temperature source Oral, resp. rate 16, SpO2 99 %.There is no height or weight on file to calculate BMI.  General Appearance: Casual  Eye Contact:  Good  Speech:  Clear and Coherent and Normal Rate  Volume:  Normal  Mood:  Anxious  Affect:  Congruent  Thought Process:  Coherent, Goal Directed and Linear  Orientation:  Full (Time, Place, and Person)  Thought Content:  Logical  Suicidal Thoughts:  No  Homicidal Thoughts:  No  Memory:  Immediate;   Good Recent;   Good Remote;   Fair  Judgement:  Good  Insight:  Fair  Psychomotor Activity:  Normal  Concentration:  Concentration: Good and Attention Span: Good  Recall:  Good  Fund of Knowledge:  Good  Language:  Good  Akathisia:  No  Handed:  Right  AIMS (if indicated):     Assets:  Agricultural consultant Housing Resilience Social Support Transportation  ADL's:  Intact  Cognition:  WNL  Sleep:        Treatment Plan Summary: Plan Bipolar 1 disorder  Discharge Home Follow up with [facility] for medication management and therapy Avoid the use of illicit drugs and alcohol Take all medications as prescribed  Disposition: No evidence of imminent risk to self or others at present.   Patient does not meet criteria for psychiatric inpatient admission. Supportive therapy provided about ongoing stressors. Discussed crisis plan,  support from social network, calling 911, coming to the Emergency Department, and calling Suicide Hotline.  Ethelene Hal, NP 11/22/2016 11:40 AM  Patient seen face-to-face for psychiatric evaluation, chart reviewed and case discussed with the physician extender and developed treatment plan. Reviewed the information documented and agree with the treatment plan. Corena Pilgrim, MD

## 2016-11-22 NOTE — [facility] Suicide Risk Assessment (Signed)
Suicide Risk Assessment  Discharge Assessment   [facility]   Principal Problem: Bipolar I disorder, most recent episode (or current) manic [facility](HCC) Discharge Diagnoses:  Patient Active Problem List   Diagnosis Date Noted  . Polysubstance abuse [F19.10] 11/01/2013    Priority: High  . Intermittent explosive disorder [F63.81] 11/22/2016  . Chronic midline low back pain [M54.5, G89.29] 05/29/2016  . Substance induced mood disorder (HCC) [F19.94] 06/28/2015  . 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 Polysubstance dependence ([facility]CC) [F19.20] 06/06/2015  . Polysubstance dependence including opioid type drug, episodic abuse ([facility]) [F11.20, F19.20] 06/03/2015  . 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 Bipolar I disorder, most recent episode (or current) manic ([facility]) [F31.10] 02/25/2013  . Substance abuse/dependence [IMO0002] 02/21/2013    Total Time spent with patient: 30 minutes  Musculoskeletal: Strength & Muscle Tone: within normal limits Gait & Station: normal Patient leans: N/A  Psychiatric Specialty Exam: Physical Exam  Constitutional: She is oriented to person, place, and time. She appears well-developed and well-nourished.  Respiratory: Effort normal.  Musculoskeletal: Normal range of motion.  Neurological: She is alert and oriented to person, place, and time.   Review of Systems  Psychiatric/Behavioral: Positive for depression and substance abuse. Negative for hallucinations, memory loss and suicidal ideas. The patient is not nervous/anxious and does not have insomnia.   All other systems reviewed and are negative.  Blood pressure 105/75, pulse 87, temperature 98 F (36.7 C), temperature source Oral, resp. rate 16, SpO2 99 %.There is no height or weight on file to calculate BMI. General Appearance: Casual Eye Contact:  Good Speech:  Clear and Coherent and Normal Rate Volume:  Normal Mood:   Anxious Affect:  Congruent Thought Process:  Coherent, Goal Directed and Linear Orientation:  Full (Time, Place, and Person) Thought Content:  Logical Suicidal Thoughts:  No Homicidal Thoughts:  No Memory:  Immediate;   Good Recent;   Good Remote;   Fair Judgement:  Good Insight:  Fair Psychomotor Activity:  Normal Concentration:  Concentration: Good and Attention Span: Good Recall:  Good Fund of Knowledge:  Good Language:  Good Akathisia:  No Handed:  Right AIMS (if indicated):    Assets:  ArchitectCommunication Skills Financial Resources/Insurance Housing Resilience Social Support Transportation ADL's:  Intact Cognition:  WNL  Mental Status Per Nursing Assessment::   On Admission:   Agitated  Demographic Factors:  Caucasian  Loss Factors: NA  Historical Factors: Impulsivity  Risk Reduction Factors:   Sense of responsibility to family, Living with another person, especially a relative and Positive social support  Continued Clinical Symptoms:  Bipolar Disorder:   Mixed State Alcohol/Substance Abuse/Dependencies More than one psychiatric diagnosis Previous Psychiatric Diagnoses and Treatments  Cognitive Features That Contribute To Risk:  Closed-mindedness    Suicide Risk:  Mild:  Suicidal ideation of limited frequency, intensity, duration, and specificity.  There are no identifiable plans, no associated intent, mild dysphoria and related symptoms, good self-control (both objective and subjective assessment), few other risk factors, and identifiable protective factors, including available and accessible social support.  Follow-up Information    Mincey, Daisy BlossomAndrew Julian, MD In 3 days.   Specialty:  Ophthalmology Contact information: [address] [place] [address] [phone]           Plan Of Care/Follow-up recommendations:  Activity:  as tolerated Diet:  Heart Healthy  Laveda AbbeLaurie Britton Parks, NP 11/22/2016, 11:55 AM

## 2016-11-22 NOTE — [facility] Assessment (Signed)
[facility] Assessment Progress Note  Per Thedore MinsMojeed Akintayo, MD, this pt does not require psychiatric hospitalization at this time.  Pt presents under IVC initiated by pt's mother, which Dr Jannifer FranklinAkintayo has rescinded.  [facility]  This has been included in pt's discharge instructions.  Pt's nurse, Wille CelesteJanie, has been notified.  Doylene Canninghomas Geronimo Diliberto, MA Triage Specialist [phone]

## 2016-11-22 NOTE — Discharge Instructions (Addendum)
For your ongoing mental health needs, you are advised to continue treatment with [facility]:       [facility]      [address]      [place], [place] [address]      [phone]   Apply the Erythromycin every 8 hours and the Ketorolac every 6 hours a day.

## 2016-11-22 NOTE — ED Notes (Signed)
Nad, resting quielty, reports eye pain has improved

## 2016-11-29 ENCOUNTER — Ambulatory Visit: Payer: Self-pay

## 2016-12-04 ENCOUNTER — Inpatient Hospital Stay: Payer: Self-pay | Admitting: Family Medicine

## 2017-03-12 IMAGING — CR DG CHEST 2V
2 series · 2 of 2 positions shown · non-contrast
Comparison: 08/25/2006

CLINICAL DATA: Cough, chills.  Addiction problem.

EXAM:
CHEST  2 VIEW

[chest pa]
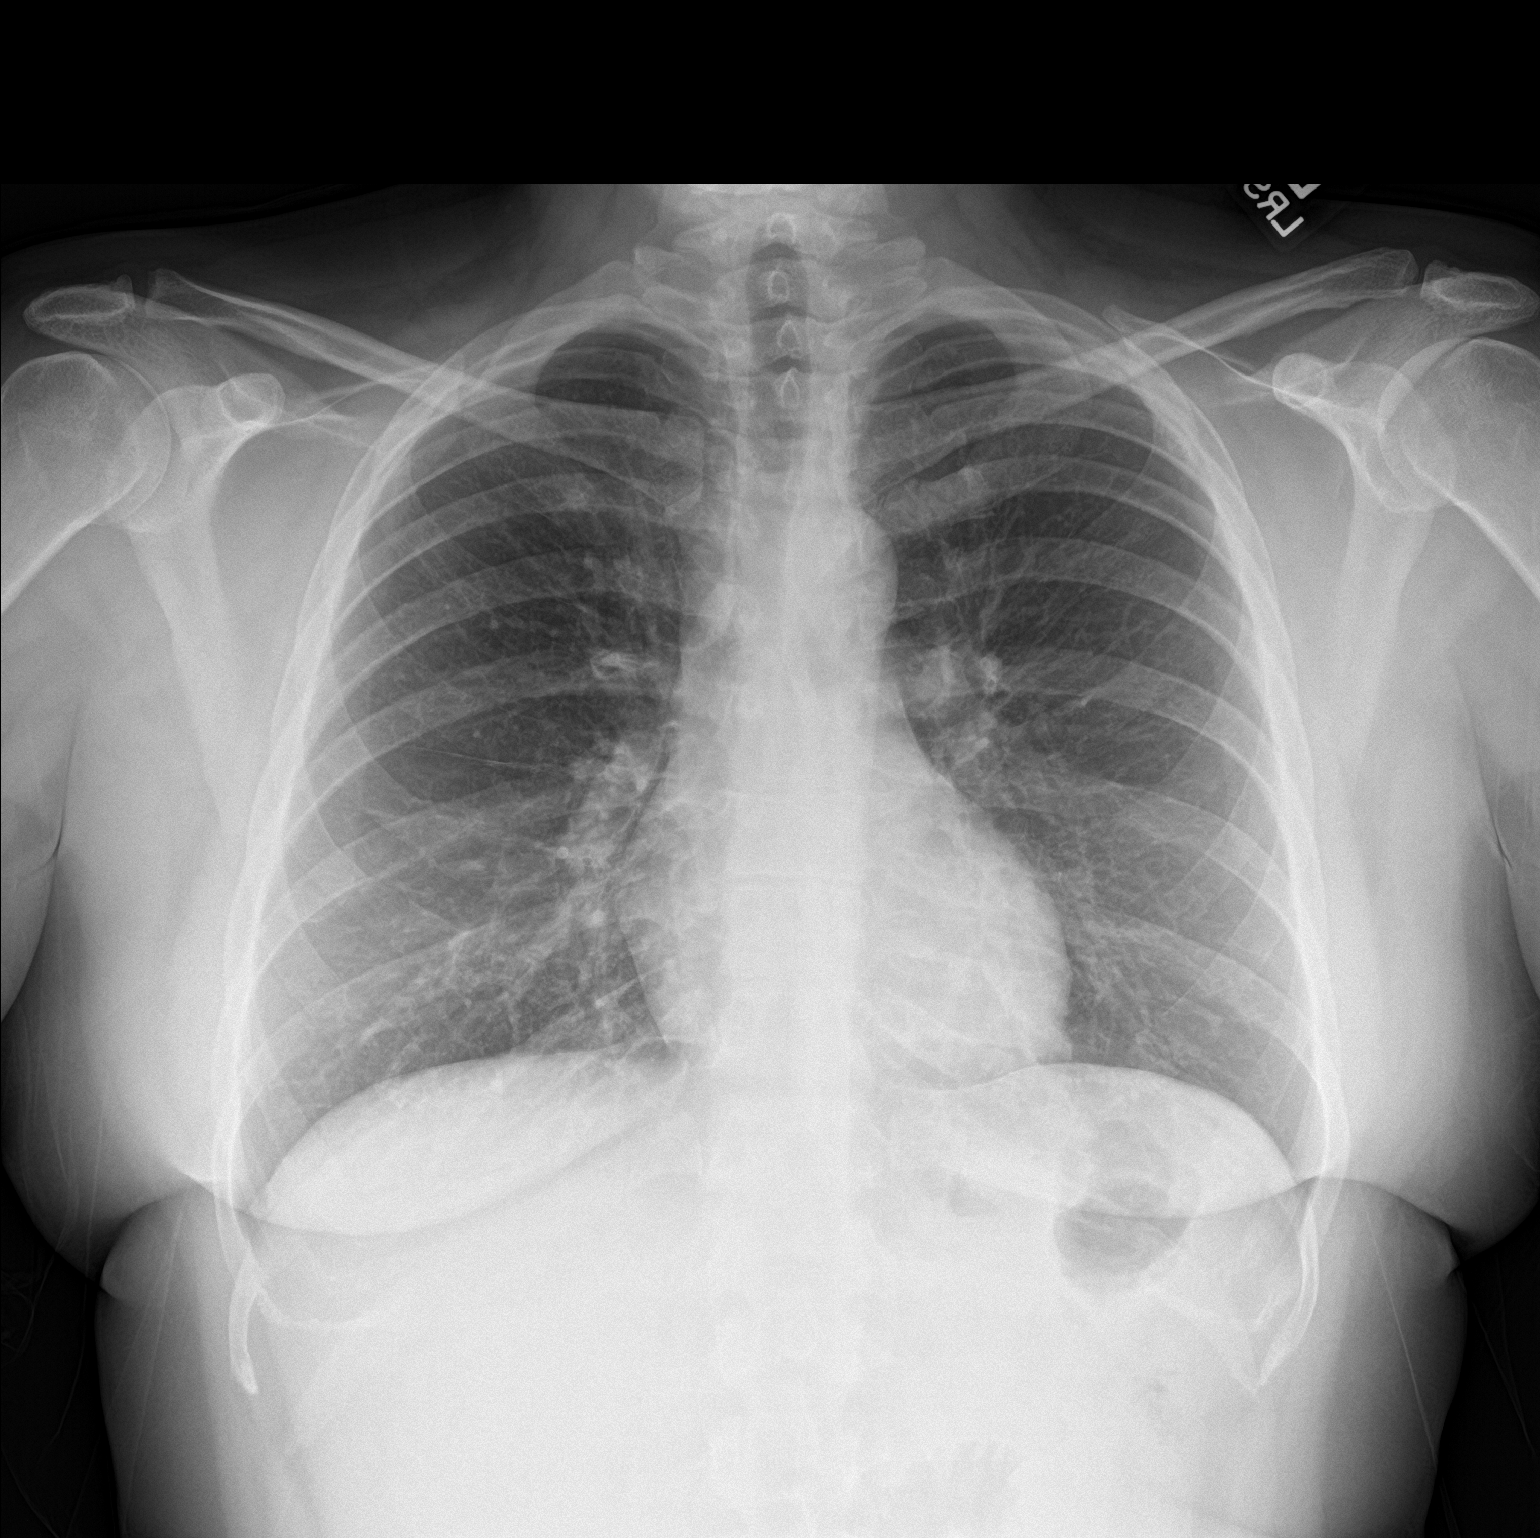

[chest lat]
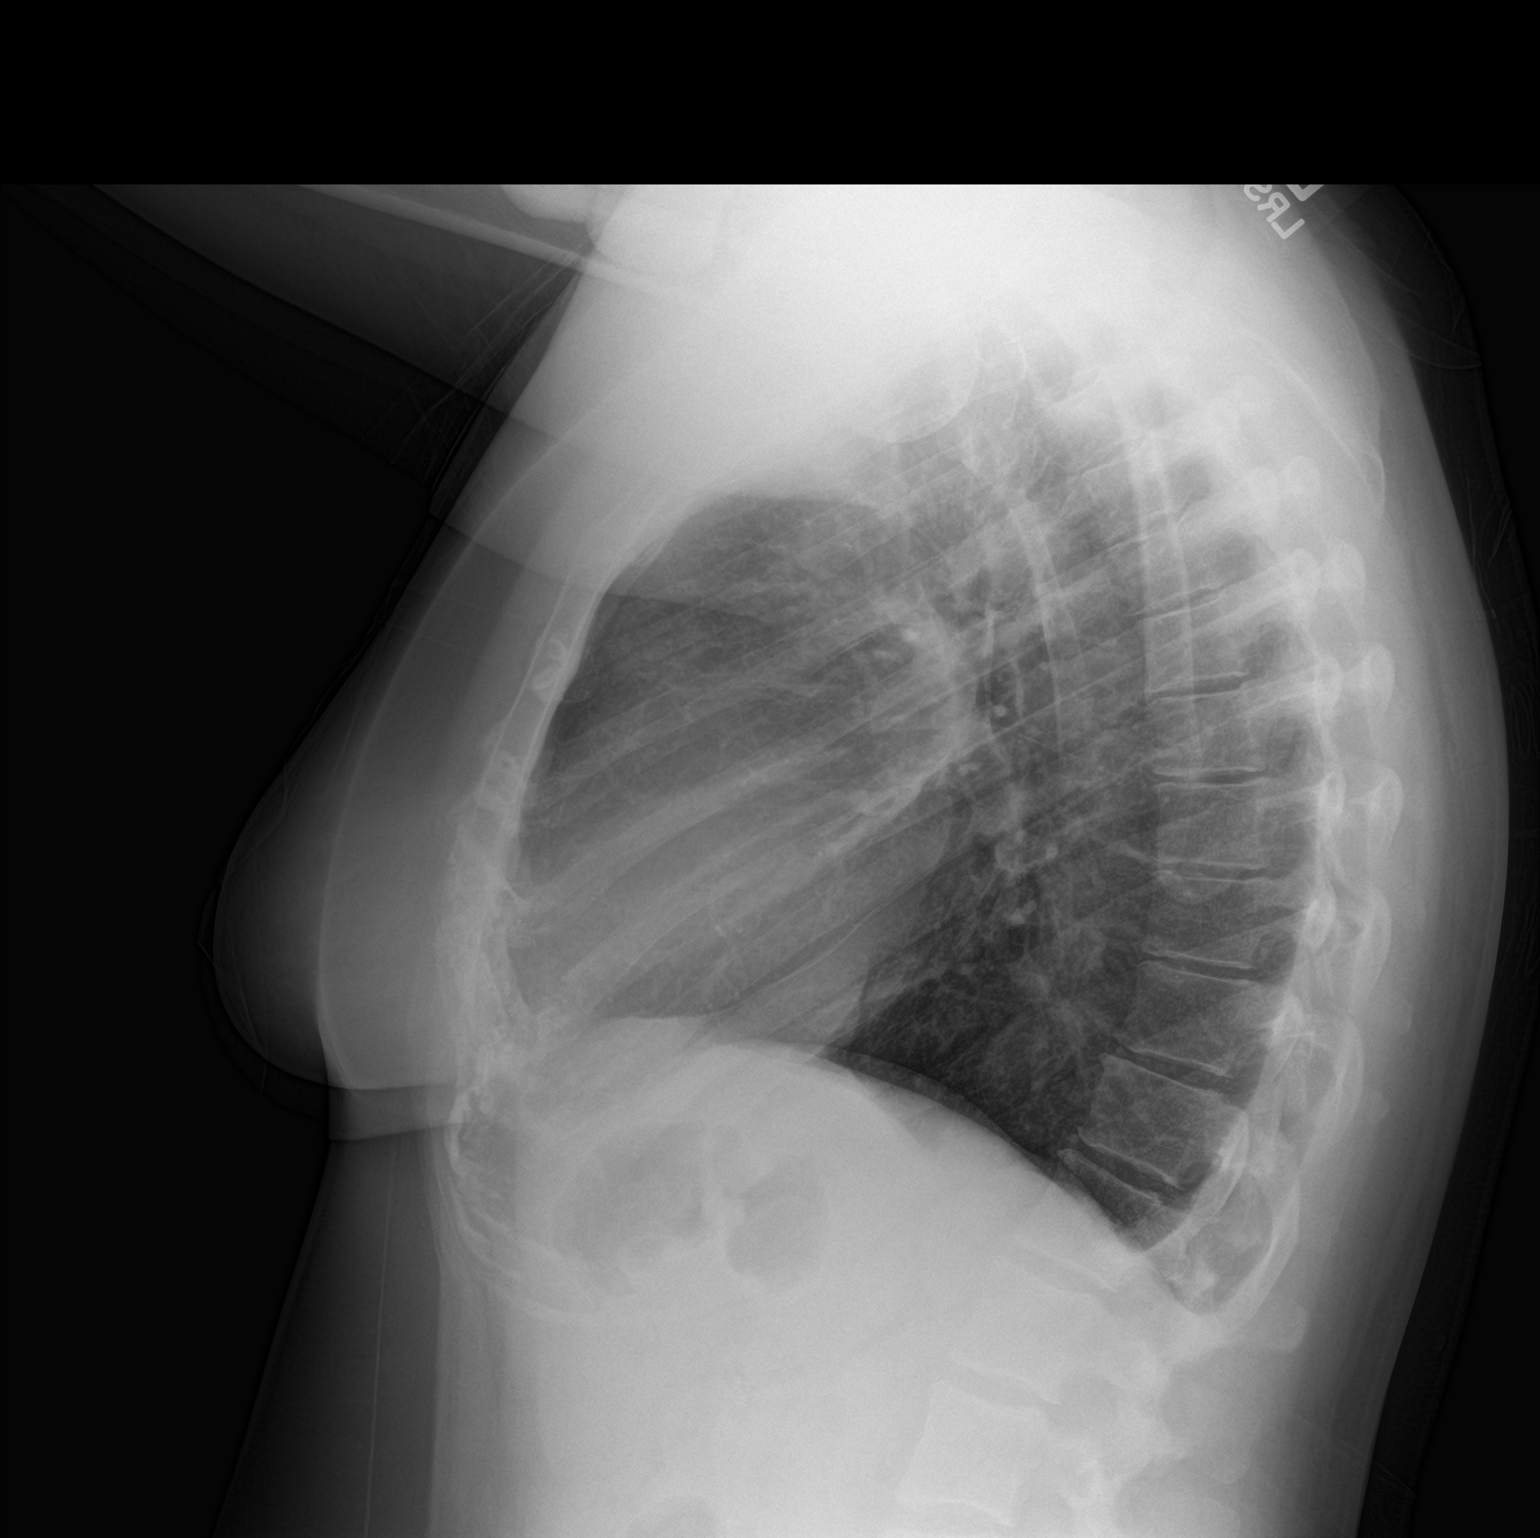

[2 of 2 positions shown; findings below may reference images not displayed]

FINDINGS: The heart size and mediastinal contours are within normal limits.
Both lungs are clear. The visualized skeletal structures are
unremarkable.
IMPRESSION: No active cardiopulmonary disease.

## 2017-05-23 ENCOUNTER — Other Ambulatory Visit: Payer: Self-pay

## 2017-05-23 ENCOUNTER — Emergency Department (HOSPITAL_COMMUNITY)
Admission: EM | Admit: 2017-05-23 | Discharge: 2017-05-23 | Disposition: A | Discharge status: Home / Self Care | Payer: Self-pay | Attending: Emergency Medicine | Admitting: Emergency Medicine

## 2017-05-23 ENCOUNTER — Encounter (HOSPITAL_COMMUNITY): Payer: Self-pay

## 2017-05-23 DIAGNOSIS — F1910 Other psychoactive substance abuse, uncomplicated: Secondary | ICD-10-CM | POA: Insufficient documentation

## 2017-05-23 DIAGNOSIS — F1994 Other psychoactive substance use, unspecified with psychoactive substance-induced mood disorder: Secondary | ICD-10-CM | POA: Insufficient documentation

## 2017-05-23 DIAGNOSIS — X780XXA Intentional self-harm by sharp glass, initial encounter: Secondary | ICD-10-CM | POA: Insufficient documentation

## 2017-05-23 DIAGNOSIS — F17210 Nicotine dependence, cigarettes, uncomplicated: Secondary | ICD-10-CM | POA: Insufficient documentation

## 2017-05-23 DIAGNOSIS — Y999 Unspecified external cause status: Secondary | ICD-10-CM | POA: Insufficient documentation

## 2017-05-23 DIAGNOSIS — R4689 Other symptoms and signs involving appearance and behavior: Secondary | ICD-10-CM

## 2017-05-23 DIAGNOSIS — Y939 Activity, unspecified: Secondary | ICD-10-CM | POA: Insufficient documentation

## 2017-05-23 DIAGNOSIS — S61511A Laceration without foreign body of right wrist, initial encounter: Secondary | ICD-10-CM | POA: Insufficient documentation

## 2017-05-23 DIAGNOSIS — Z23 Encounter for immunization: Secondary | ICD-10-CM | POA: Insufficient documentation

## 2017-05-23 DIAGNOSIS — Y929 Unspecified place or not applicable: Secondary | ICD-10-CM | POA: Insufficient documentation

## 2017-05-23 DIAGNOSIS — Z79899 Other long term (current) drug therapy: Secondary | ICD-10-CM | POA: Insufficient documentation

## 2017-05-23 LAB — RAPID URINE DRUG SCREEN, HOSP PERFORMED
Amphetamines: NOT DETECTED
Barbiturates: NOT DETECTED
Benzodiazepines: POSITIVE — AB
COCAINE: NOT DETECTED
OPIATES: NOT DETECTED
TETRAHYDROCANNABINOL: POSITIVE — AB

## 2017-05-23 LAB — COMPREHENSIVE METABOLIC PANEL
ALT: 16 U/L (ref 14–54)
ANION GAP: 17 — AB (ref 5–15)
AST: 18 U/L (ref 15–41)
Albumin: 4.3 g/dL (ref 3.5–5.0)
Alkaline Phosphatase: 52 U/L (ref 38–126)
BUN: 13 mg/dL (ref 6–20)
CALCIUM: 9.2 mg/dL (ref 8.9–10.3)
CHLORIDE: 104 mmol/L (ref 101–111)
CO2: 13 mmol/L — AB (ref 22–32)
Creatinine, Ser: 0.91 mg/dL (ref 0.44–1.00)
GFR calc non Af Amer: >60 mL/min (ref >60)
Glucose, Bld: 97 mg/dL (ref 65–99)
Potassium: 3.7 mmol/L (ref 3.5–5.1)
SODIUM: 134 mmol/L — AB (ref 135–145)
Total Bilirubin: 0.3 mg/dL (ref 0.3–1.2)
Total Protein: 7.2 g/dL (ref 6.5–8.1)

## 2017-05-23 LAB — CBC
HCT: 36.2 % (ref 36.0–46.0)
HEMOGLOBIN: 12.5 g/dL (ref 12.0–15.0)
MCH: 33.8 pg (ref 26.0–34.0)
MCHC: 34.5 g/dL (ref 30.0–36.0)
MCV: 97.8 fL (ref 78.0–100.0)
PLATELETS: 349 10*3/uL (ref 150–400)
RBC: 3.7 MIL/uL — AB (ref 3.87–5.11)
RDW: 14.1 % (ref 11.5–15.5)
WBC: 10.9 10*3/uL — ABNORMAL HIGH (ref 4.0–10.5)

## 2017-05-23 LAB — I-STAT BETA HCG BLOOD, ED (MC, WL, AP ONLY): I-stat hCG, quantitative: <5 m[IU]/mL (ref <5)

## 2017-05-23 LAB — ACETAMINOPHEN LEVEL

## 2017-05-23 LAB — ETHANOL: Alcohol, Ethyl (B): 69 mg/dL — ABNORMAL HIGH (ref <10)

## 2017-05-23 LAB — SALICYLATE LEVEL

## 2017-05-23 MED ORDER — NICOTINE 21 MG/24HR TD PT24: 21.0000 mg | MEDICATED_PATCH | Freq: Every day | TRANSDERMAL | Status: DC

## 2017-05-23 MED ORDER — VITAMIN B-1 100 MG PO TABS
100.0000 mg | ORAL_TABLET | Freq: Every day | ORAL | Status: DC
  Administered 2017-05-23: 100 mg via ORAL
  Filled 2017-05-23: qty 1

## 2017-05-23 MED ORDER — ALUM & MAG HYDROXIDE-SIMETH 200-200-20 MG/5ML PO SUSP: 30.0000 mL | Freq: Four times a day (QID) | ORAL | Status: DC | PRN

## 2017-05-23 MED ORDER — STERILE WATER FOR INJECTION IJ SOLN
INTRAMUSCULAR | Status: AC
  Filled 2017-05-23: qty 10

## 2017-05-23 MED ORDER — TETANUS-DIPHTH-ACELL PERTUSSIS 5-2.5-18.5 LF-MCG/0.5 IM SUSP
0.5000 mL | Freq: Once | INTRAMUSCULAR | Status: AC
  Administered 2017-05-23: 0.5 mL via INTRAMUSCULAR
  Filled 2017-05-23: qty 0.5

## 2017-05-23 MED ORDER — IBUPROFEN 200 MG PO TABS
600.0000 mg | ORAL_TABLET | Freq: Three times a day (TID) | ORAL | Status: DC | PRN
  Administered 2017-05-23: 600 mg via ORAL
  Filled 2017-05-23: qty 3

## 2017-05-23 MED ORDER — ONDANSETRON HCL 4 MG PO TABS
4.0000 mg | ORAL_TABLET | Freq: Three times a day (TID) | ORAL | Status: DC | PRN
  Administered 2017-05-23: 4 mg via ORAL
  Filled 2017-05-23: qty 1

## 2017-05-23 MED ORDER — LORAZEPAM 2 MG/ML IJ SOLN: 0.0000 mg | Freq: Four times a day (QID) | INTRAMUSCULAR | Status: DC

## 2017-05-23 MED ORDER — LORAZEPAM 1 MG PO TABS
0.0000 mg | ORAL_TABLET | Freq: Four times a day (QID) | ORAL | Status: DC
  Administered 2017-05-23: 1 mg via ORAL
  Filled 2017-05-23: qty 1

## 2017-05-23 MED ORDER — THIAMINE HCL 100 MG/ML IJ SOLN: 100.0000 mg | Freq: Every day | INTRAMUSCULAR | Status: DC

## 2017-05-23 MED ORDER — ZIPRASIDONE MESYLATE 20 MG IM SOLR
INTRAMUSCULAR | Status: AC
  Filled 2017-05-23: qty 20

## 2017-05-23 MED ORDER — LIDOCAINE-EPINEPHRINE (PF) 2 %-1:200000 IJ SOLN
10.0000 mL | Freq: Once | INTRAMUSCULAR | Status: DC
  Filled 2017-05-23: qty 20
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
## 2017-05-23 NOTE — ED Provider Notes (Signed)
[facility]-EMERGENCY DEPT Provider Note: Denise DellJ. Lane Aleaya Latona, MD, FACEP  CSN: [id number] MRN: [id number] ARRIVAL: 05/23/17 at 0535 ROOM: WA18/WA18   CHIEF COMPLAINT  Aggressive Behavior   HISTORY OF PRESENT ILLNESS  05/23/17 5:35 AM [name] is a 37 y.o. female with a history of polysubstance abuse and bipolar disorder. She is here reportedly for suicidal ideations.  She is reportedly being involuntarily committed by the police but she did not come with papers.  She was very agitated and combative prior to arrival requiring 10 mg of IM Versed to calm her down.  On arrival she is calm and cooperative.  She admits to high levels of stress over the past 3 weeks which caused her to become aggravated this morning.  She admits to striking her right forearm with a 1 bottle out of anger.  She has several small lacerations to her volar right forearm.  She denies suicidal ideation or intent.  She denies homicidal ideation.  She admits to taking a benzodiazepine tablet earlier to help with her anxiety as well as drinking alcohol earlier.  She denies physical pain or other somatic complaints.   Past Medical History:  Diagnosis Date  . Anxiety   . Benzodiazepine abuse (HCC)   . Bipolar 1 disorder (HCC)   . Depression   . Drug abuse (HCC)   . ETOH abuse   . Polysubstance abuse (HCC)     History reviewed. No pertinent surgical history.  No family history on file.  Social History   Tobacco Use  . Smoking status: Current Every Day Smoker    Packs/day: 2.00    Years: 17.00    Pack years: 34.00    Types: Cigarettes  . Smokeless tobacco: Never Used  Substance Use Topics  . Alcohol use: Yes    Alcohol/week: 7.2 oz    Types: 12 Cans of beer per week    Comment: Per report; Pt denied current use; BAC clear  . Drug use: Yes    Frequency: 7.0 times per week    Types: Heroin, Benzodiazepines, Marijuana, IV    Comment: Per previous report; Pt denied use in over a year; UDS not available     Prior to Admission medications   Medication Sig Start Date End Date Taking? Authorizing Provider  alprazolam Prudy Feeler(XANAX) 2 MG tablet Take 2 mg by mouth 2 (two) times daily.    [provider]  Aspirin-Acetaminophen-Caffeine (GOODY HEADACHE PO) Take 1-2 packets by mouth every 6 (six) hours as needed (pain/ headache).    [provider]  dicyclomine (BENTYL) 20 MG tablet Take 1 tablet (20 mg total) by mouth 2 (two) times daily. 10/19/16   Palumbo, April, MD  diphenhydrAMINE (BENADRYL) 25 mg capsule Take 25-50 mg by mouth every 6 (six) hours as needed for sleep (anxiety).     [provider]  gabapentin (NEURONTIN) 300 MG capsule Take 1 capsule (300 mg total) by mouth 2 (two) times daily. For numbness 06/05/16   Arrie SenateHairston, Mandesia R, FNP  hydrOXYzine (ATARAX/VISTARIL) 50 MG tablet Take 50 mg by mouth 3 (three) times daily.    [provider]  ibuprofen (ADVIL,MOTRIN) 200 MG tablet Take 600 mg by mouth every 6 (six) hours as needed for moderate pain.     [provider]  ibuprofen (ADVIL,MOTRIN) 800 MG tablet Take 1 tablet (800 mg total) by mouth every 8 (eight) hours as needed for moderate pain or cramping. Patient not taking: Reported on 06/05/2016 05/29/16   Lizbeth BarkHairston, Mandesia R,  FNP  naproxen (NAPROSYN) 500 MG tablet Take 1 tablet (500 mg total) by mouth 2 (two) times daily with a meal. As needed for pain. Patient taking differently: Take 500 mg by mouth 2 (two) times daily as needed for mild pain. As needed for pain. 06/05/16   Lizbeth Bark, FNP  OLANZapine (ZYPREXA) 10 MG tablet Take 1 tablet (10 mg total) by mouth 2 (two) times daily. Patient not taking: Reported on 11/21/2016 07/21/16   Adonis Brook, NP  omeprazole (PRILOSEC OTC) 20 MG tablet Take 20 mg by mouth daily.    [provider]  ondansetron (ZOFRAN ODT) 8 MG disintegrating tablet 8mg  ODT q8 hours prn nausea 10/19/16   Palumbo, April, MD  prazosin (MINIPRESS) 2 MG capsule Take 2  capsules (4 mg total) by mouth at bedtime. 07/21/16   Adonis Brook, NP  psyllium (METAMUCIL SMOOTH TEXTURE) 28 % packet Take 1 packet by mouth 2 (two) times daily. Patient not taking: Reported on 06/05/2016 04/11/16   Lizbeth Bark, FNP    Allergies Haldol [haloperidol lactate]   REVIEW OF SYSTEMS  Negative except as noted here or in the History of Present Illness.   PHYSICAL EXAMINATION  Initial Vital Signs Blood pressure 124/90, pulse (!) 110, temperature 98.1 F (36.7 C), temperature source Oral, resp. rate 18, last menstrual period 05/22/2017, SpO2 97 %.  Examination General: Well-developed, well-nourished female in no acute distress; appearance consistent with age of record HENT: normocephalic; atraumatic Eyes: pupils equal, round and reactive to light; extraocular muscles intact Neck: supple Heart: regular rate and rhythm Lungs: clear to auscultation bilaterally Abdomen: soft; nondistended; nontender; bowel sounds present Extremities: No deformity; full range of motion; pulses normal Neurologic: Awake, alert and oriented; motor function intact in all extremities and symmetric; no facial droop Skin: Warm and dry; small lacerations to right volar forearm:  Psychiatric: Denies SI, HI; calm, cooperative; admits to anxiety and frustration   RESULTS  Summary of this visit's results, reviewed by myself:   EKG Interpretation  Date/Time:    Ventricular Rate:    PR Interval:    QRS Duration:   QT Interval:    QTC Calculation:   R Axis:     Text Interpretation:        Laboratory Studies: Results for orders placed or performed during the hospital encounter of 05/23/17 (from the past 24 hour(s))  Rapid urine drug screen (hospital performed)     Status: Abnormal   Collection Time: 05/23/17  5:50 AM  Result Value Ref Range   Opiates NONE DETECTED NONE DETECTED   Cocaine NONE DETECTED NONE DETECTED   Benzodiazepines POSITIVE (A) NONE DETECTED   Amphetamines NONE  DETECTED NONE DETECTED   Tetrahydrocannabinol POSITIVE (A) NONE DETECTED   Barbiturates NONE DETECTED NONE DETECTED  Comprehensive metabolic panel     Status: Abnormal   Collection Time: 05/23/17  6:04 AM  Result Value Ref Range   Sodium 134 (L) 135 - 145 mmol/L   Potassium 3.7 3.5 - 5.1 mmol/L   Chloride 104 101 - 111 mmol/L   CO2 13 (L) 22 - 32 mmol/L   Glucose, Bld 97 65 - 99 mg/dL   BUN 13 6 - 20 mg/dL   Creatinine, Ser 1.61 0.44 - 1.00 mg/dL   Calcium 9.2 8.9 - 09.6 mg/dL   Total Protein 7.2 6.5 - 8.1 g/dL   Albumin 4.3 3.5 - 5.0 g/dL   AST 18 15 - 41 U/L   ALT 16 14 - 54 U/L  Alkaline Phosphatase 52 38 - 126 U/L   Total Bilirubin 0.3 0.3 - 1.2 mg/dL   GFR calc non Af Amer >60 >60 mL/min   GFR calc Af Amer >60 >60 mL/min   Anion gap 17 (H) 5 - 15  Ethanol     Status: Abnormal   Collection Time: 05/23/17  6:04 AM  Result Value Ref Range   Alcohol, Ethyl (B) 69 (H) <10 mg/dL  Salicylate level     Status: None   Collection Time: 05/23/17  6:04 AM  Result Value Ref Range   Salicylate Lvl <7.0 2.8 - 30.0 mg/dL  Acetaminophen level     Status: Abnormal   Collection Time: 05/23/17  6:04 AM  Result Value Ref Range   Acetaminophen (Tylenol), Serum <10 (L) 10 - 30 ug/mL  cbc     Status: Abnormal   Collection Time: 05/23/17  6:04 AM  Result Value Ref Range   WBC 10.9 (H) 4.0 - 10.5 K/uL   RBC 3.70 (L) 3.87 - 5.11 MIL/uL   Hemoglobin 12.5 12.0 - 15.0 g/dL   HCT 40.9 81.1 - 91.4 %   MCV 97.8 78.0 - 100.0 fL   MCH 33.8 26.0 - 34.0 pg   MCHC 34.5 30.0 - 36.0 g/dL   RDW 78.2 95.6 - 21.3 %   Platelets 349 150 - 400 K/uL  I-Stat beta hCG blood, ED     Status: None   Collection Time: 05/23/17  6:11 AM  Result Value Ref Range   I-stat hCG, quantitative <5.0 <5 mIU/mL   Comment 3           Imaging Studies: No results found.  ED COURSE  Nursing notes and initial vitals signs, including pulse oximetry, reviewed.  Vitals:   05/23/17 0542  BP: 124/90  Pulse: (!) 110  Resp:  18  Temp: 98.1 F (36.7 C)  TempSrc: Oral  SpO2: 97%    PROCEDURES   LACERATION REPAIR Performed by: Hanley Seamen Authorized by: Hanley Seamen Consent: Verbal consent obtained. Risks and benefits: risks, benefits and alternatives were discussed Consent given by: patient Patient identity confirmed: provided demographic data Prepped and Draped in normal sterile fashion Wound explored, no foreign body seen or palpated  Laceration Location: Right volar forearm x3  Laceration Length: 1.5 cm, 0.5 cm, 0.5 cm  No Foreign Bodies seen or palpated  Anesthesia: local infiltration  Local anesthetic: lidocaine 2% with epinephrine  Anesthetic total: 4 ml  Irrigation method: syringe Amount of cleaning: standard  Skin closure: 4-0 Prolene  Number of sutures: 2+1+1  Technique: Simple interrupted  Patient tolerance: Patient tolerated the procedure well with no immediate complications.   ED DIAGNOSES     ICD-10-CM   1. Aggressive behavior R46.89   2. Polysubstance abuse (HCC) F19.10   3. Self-inflicted laceration of wrist, right, initial encounter Y86.578I        Paula Libra, MD 05/23/17 [phone]

## 2017-05-23 NOTE — [facility] Assessment (Signed)
[facility]  Per Juanetta BeetsJacqueline Norman, DO, this pt does not require psychiatric hospitalization at this time.  [facility]  This has been included in pt's discharge instructions.  Pt's nurse, Carlisle BeersLuann, has been notified.  Doylene Canninghomas Fina Heizer, MA Triage Specialist [phone]
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
## 2017-05-23 NOTE — Discharge Instructions (Addendum)
For your mental health needs, you are advised to continue treatment with [facility]: ° °     [facility] °     [address] °     [place], [place] [address] °     [phone] °

## 2017-05-23 NOTE — ED Notes (Signed)
Denies SI/HI.  Bright and appropriate during d/c process.

## 2017-05-23 NOTE — ED Notes (Signed)
Pt is changed out into scrubs, all personal items have been placed into 2 personal belongings bags located at the nurses station.

## 2017-06-27 ENCOUNTER — Inpatient Hospital Stay (HOSPITAL_COMMUNITY)
Admission: EM | Admit: 2017-06-27 | Discharge: 2017-06-28 | DRG: 917 | Disposition: A | Discharge status: Home / Self Care | Payer: Self-pay | Attending: Internal Medicine | Admitting: Internal Medicine

## 2017-06-27 ENCOUNTER — Emergency Department (HOSPITAL_COMMUNITY): Payer: Self-pay

## 2017-06-27 ENCOUNTER — Encounter (HOSPITAL_COMMUNITY): Payer: Self-pay | Admitting: Emergency Medicine

## 2017-06-27 ENCOUNTER — Other Ambulatory Visit: Payer: Self-pay

## 2017-06-27 DIAGNOSIS — T50902A Poisoning by unspecified drugs, medicaments and biological substances, intentional self-harm, initial encounter: Secondary | ICD-10-CM

## 2017-06-27 DIAGNOSIS — Z915 Personal history of self-harm: Secondary | ICD-10-CM

## 2017-06-27 DIAGNOSIS — E876 Hypokalemia: Secondary | ICD-10-CM | POA: Diagnosis present

## 2017-06-27 DIAGNOSIS — J9602 Acute respiratory failure with hypercapnia: Secondary | ICD-10-CM | POA: Diagnosis present

## 2017-06-27 DIAGNOSIS — T43222A Poisoning by selective serotonin reuptake inhibitors, intentional self-harm, initial encounter: Secondary | ICD-10-CM | POA: Diagnosis present

## 2017-06-27 DIAGNOSIS — F10920 Alcohol use, unspecified with intoxication, uncomplicated: Secondary | ICD-10-CM

## 2017-06-27 DIAGNOSIS — R52 Pain, unspecified: Secondary | ICD-10-CM

## 2017-06-27 DIAGNOSIS — F10220 Alcohol dependence with intoxication, uncomplicated: Secondary | ICD-10-CM | POA: Diagnosis present

## 2017-06-27 DIAGNOSIS — I4581 Long QT syndrome: Secondary | ICD-10-CM | POA: Diagnosis present

## 2017-06-27 DIAGNOSIS — T50904A Poisoning by unspecified drugs, medicaments and biological substances, undetermined, initial encounter: Secondary | ICD-10-CM

## 2017-06-27 DIAGNOSIS — G92 Toxic encephalopathy: Secondary | ICD-10-CM | POA: Diagnosis present

## 2017-06-27 DIAGNOSIS — T450X2A Poisoning by antiallergic and antiemetic drugs, intentional self-harm, initial encounter: Principal | ICD-10-CM | POA: Diagnosis present

## 2017-06-27 DIAGNOSIS — Z978 Presence of other specified devices: Secondary | ICD-10-CM

## 2017-06-27 DIAGNOSIS — F419 Anxiety disorder, unspecified: Secondary | ICD-10-CM | POA: Diagnosis present

## 2017-06-27 DIAGNOSIS — F1310 Sedative, hypnotic or anxiolytic abuse, uncomplicated: Secondary | ICD-10-CM | POA: Diagnosis present

## 2017-06-27 DIAGNOSIS — G8929 Other chronic pain: Secondary | ICD-10-CM | POA: Diagnosis present

## 2017-06-27 DIAGNOSIS — J410 Simple chronic bronchitis: Secondary | ICD-10-CM | POA: Diagnosis present

## 2017-06-27 DIAGNOSIS — Y92009 Unspecified place in unspecified non-institutional (private) residence as the place of occurrence of the external cause: Secondary | ICD-10-CM

## 2017-06-27 DIAGNOSIS — Y906 Blood alcohol level of 120-199 mg/100 ml: Secondary | ICD-10-CM | POA: Diagnosis present

## 2017-06-27 DIAGNOSIS — Z888 Allergy status to other drugs, medicaments and biological substances status: Secondary | ICD-10-CM

## 2017-06-27 DIAGNOSIS — F17210 Nicotine dependence, cigarettes, uncomplicated: Secondary | ICD-10-CM | POA: Diagnosis present

## 2017-06-27 DIAGNOSIS — T50901A Poisoning by unspecified drugs, medicaments and biological substances, accidental (unintentional), initial encounter: Secondary | ICD-10-CM | POA: Diagnosis present

## 2017-06-27 DIAGNOSIS — T43592A Poisoning by other antipsychotics and neuroleptics, intentional self-harm, initial encounter: Secondary | ICD-10-CM | POA: Diagnosis present

## 2017-06-27 DIAGNOSIS — F319 Bipolar disorder, unspecified: Secondary | ICD-10-CM | POA: Diagnosis present

## 2017-06-27 DIAGNOSIS — M25572 Pain in left ankle and joints of left foot: Secondary | ICD-10-CM | POA: Diagnosis present

## 2017-06-27 DIAGNOSIS — I952 Hypotension due to drugs: Secondary | ICD-10-CM | POA: Diagnosis present

## 2017-06-27 DIAGNOSIS — T424X2A Poisoning by benzodiazepines, intentional self-harm, initial encounter: Secondary | ICD-10-CM | POA: Diagnosis present

## 2017-06-27 LAB — RAPID URINE DRUG SCREEN, HOSP PERFORMED
Amphetamines: NOT DETECTED
BARBITURATES: NOT DETECTED
Benzodiazepines: POSITIVE — AB
Cocaine: NOT DETECTED
Opiates: NOT DETECTED
Tetrahydrocannabinol: POSITIVE — AB

## 2017-06-27 LAB — CBC WITH DIFFERENTIAL/PLATELET
BASOS ABS: 0 10*3/uL (ref 0.0–0.1)
BASOS PCT: 0 %
Eosinophils Absolute: 0 10*3/uL (ref 0.0–0.7)
Eosinophils Relative: 1 %
HEMATOCRIT: 38.6 % (ref 36.0–46.0)
HEMOGLOBIN: 13.3 g/dL (ref 12.0–15.0)
Lymphocytes Relative: 46 %
Lymphs Abs: 2.7 10*3/uL (ref 0.7–4.0)
MCH: 34.1 pg — ABNORMAL HIGH (ref 26.0–34.0)
MCHC: 34.5 g/dL (ref 30.0–36.0)
MCV: 99 fL (ref 78.0–100.0)
Monocytes Absolute: 0.4 10*3/uL (ref 0.1–1.0)
Monocytes Relative: 6 %
NEUTROS ABS: 2.9 10*3/uL (ref 1.7–7.7)
NEUTROS PCT: 47 %
Platelets: 336 10*3/uL (ref 150–400)
RBC: 3.9 MIL/uL (ref 3.87–5.11)
RDW: 13.3 % (ref 11.5–15.5)
WBC: 6 10*3/uL (ref 4.0–10.5)

## 2017-06-27 LAB — COMPREHENSIVE METABOLIC PANEL
ALK PHOS: 61 U/L (ref 38–126)
ALT: 15 U/L (ref 14–54)
AST: 16 U/L (ref 15–41)
Albumin: 4.2 g/dL (ref 3.5–5.0)
Anion gap: 11 (ref 5–15)
BILIRUBIN TOTAL: 0.4 mg/dL (ref 0.3–1.2)
CALCIUM: 8.8 mg/dL — AB (ref 8.9–10.3)
CO2: 22 mmol/L (ref 22–32)
CREATININE: 0.69 mg/dL (ref 0.44–1.00)
Chloride: 105 mmol/L (ref 101–111)
GFR calc Af Amer: >60 mL/min (ref >60)
GLUCOSE: 117 mg/dL — AB (ref 65–99)
POTASSIUM: 2.9 mmol/L — AB (ref 3.5–5.1)
Sodium: 138 mmol/L (ref 135–145)
TOTAL PROTEIN: 7 g/dL (ref 6.5–8.1)

## 2017-06-27 LAB — I-STAT ARTERIAL BLOOD GAS, ED
Acid-base deficit: 8 mmol/L — ABNORMAL HIGH (ref 0.0–2.0)
Acid-base deficit: 4 mmol/L — ABNORMAL HIGH (ref 0.0–2.0)
BICARBONATE: 19.4 mmol/L — AB (ref 20.0–28.0)
BICARBONATE: 22.9 mmol/L (ref 20.0–28.0)
O2 Saturation: 95 %
O2 Saturation: 100 %
PH ART: 7.252 — AB (ref 7.350–7.450)
PH ART: 7.251 — AB (ref 7.350–7.450)
PO2 ART: 90 mmHg (ref 83.0–108.0)
TCO2: 21 mmol/L — ABNORMAL LOW (ref 22–32)
TCO2: 24 mmol/L (ref 22–32)
pCO2 arterial: 44 mmHg (ref 32.0–48.0)
pCO2 arterial: 52.2 mmHg — ABNORMAL HIGH (ref 32.0–48.0)
pO2, Arterial: 398 mmHg — ABNORMAL HIGH (ref 83.0–108.0)

## 2017-06-27 LAB — BASIC METABOLIC PANEL
Anion gap: 8 (ref 5–15)
BUN: 6 mg/dL (ref 6–20)
CHLORIDE: 114 mmol/L — AB (ref 101–111)
CO2: 19 mmol/L — AB (ref 22–32)
Calcium: 6.9 mg/dL — ABNORMAL LOW (ref 8.9–10.3)
Creatinine, Ser: 0.66 mg/dL (ref 0.44–1.00)
GFR calc non Af Amer: >60 mL/min (ref >60)
Glucose, Bld: 80 mg/dL (ref 65–99)
Potassium: 3.3 mmol/L — ABNORMAL LOW (ref 3.5–5.1)
Sodium: 141 mmol/L (ref 135–145)

## 2017-06-27 LAB — MAGNESIUM
Magnesium: 1.9 mg/dL (ref 1.7–2.4)
Magnesium: 1.5 mg/dL — ABNORMAL LOW (ref 1.7–2.4)

## 2017-06-27 LAB — ETHANOL: ALCOHOL ETHYL (B): 129 mg/dL — AB (ref <10)

## 2017-06-27 LAB — ACETAMINOPHEN LEVEL

## 2017-06-27 LAB — I-STAT BETA HCG BLOOD, ED (MC, WL, AP ONLY)

## 2017-06-27 LAB — MRSA PCR SCREENING: MRSA by PCR: NEGATIVE

## 2017-06-27 LAB — HIV ANTIBODY (ROUTINE TESTING W REFLEX): HIV Screen 4th Generation wRfx: NONREACTIVE

## 2017-06-27 LAB — PHOSPHORUS: PHOSPHORUS: 3.6 mg/dL (ref 2.5–4.6)

## 2017-06-27 LAB — SALICYLATE LEVEL: Salicylate Lvl: <7 mg/dL (ref 2.8–30.0)

## 2017-06-27 LAB — TRIGLYCERIDES: Triglycerides: 106 mg/dL (ref <150)

## 2017-06-27 MED ORDER — PANTOPRAZOLE SODIUM 40 MG IV SOLR
40.0000 mg | Freq: Every day | INTRAVENOUS | Status: DC
  Administered 2017-06-27: 40 mg via INTRAVENOUS
  Filled 2017-06-27: qty 40

## 2017-06-27 MED ORDER — FENTANYL 2500MCG IN NS 250ML (10MCG/ML) PREMIX INFUSION
25.0000 ug/h | INTRAVENOUS | Status: DC
  Administered 2017-06-27: 100 ug/h via INTRAVENOUS
  Filled 2017-06-27: qty 250

## 2017-06-27 MED ORDER — MIDAZOLAM HCL 2 MG/2ML IJ SOLN: 2.0000 mg | INTRAMUSCULAR | Status: DC | PRN

## 2017-06-27 MED ORDER — PROPOFOL 1000 MG/100ML IV EMUL
INTRAVENOUS | Status: AC | PRN
  Administered 2017-06-27: 20 ug/kg/min via INTRAVENOUS

## 2017-06-27 MED ORDER — SODIUM CHLORIDE 0.9 % IV BOLUS (SEPSIS)
1000.0000 mL | Freq: Once | INTRAVENOUS | Status: AC
  Administered 2017-06-27: 1000 mL via INTRAVENOUS

## 2017-06-27 MED ORDER — SUCCINYLCHOLINE CHLORIDE 20 MG/ML IJ SOLN
INTRAMUSCULAR | Status: AC | PRN
  Administered 2017-06-27: 100 mg via INTRAVENOUS

## 2017-06-27 MED ORDER — FENTANYL CITRATE (PF) 100 MCG/2ML IJ SOLN
50.0000 ug | Freq: Once | INTRAMUSCULAR | Status: AC
  Administered 2017-06-27: 50 ug via INTRAVENOUS

## 2017-06-27 MED ORDER — NICOTINE 21 MG/24HR TD PT24
21.0000 mg | MEDICATED_PATCH | Freq: Every day | TRANSDERMAL | Status: DC
  Administered 2017-06-27 – 2017-06-28 (×2): 21 mg via TRANSDERMAL
  Filled 2017-06-27 (×2): qty 1

## 2017-06-27 MED ORDER — DOCUSATE SODIUM 50 MG/5ML PO LIQD
100.0000 mg | Freq: Two times a day (BID) | ORAL | Status: DC
  Administered 2017-06-27: 100 mg
  Filled 2017-06-27 (×3): qty 10

## 2017-06-27 MED ORDER — MAGNESIUM SULFATE IN D5W 1-5 GM/100ML-% IV SOLN
1.0000 g | Freq: Once | INTRAVENOUS | Status: AC
Start: 2017-06-27 — End: 2017-06-27
  Administered 2017-06-27: 1 g via INTRAVENOUS
  Filled 2017-06-27: qty 100

## 2017-06-27 MED ORDER — MIDAZOLAM HCL 2 MG/2ML IJ SOLN
INTRAMUSCULAR | Status: AC | PRN
  Administered 2017-06-27: 2 mg via INTRAVENOUS

## 2017-06-27 MED ORDER — FENTANYL CITRATE (PF) 100 MCG/2ML IJ SOLN
INTRAMUSCULAR | Status: AC
  Filled 2017-06-27: qty 2

## 2017-06-27 MED ORDER — CHLORHEXIDINE GLUCONATE 0.12% ORAL RINSE (MEDLINE KIT)
15.0000 mL | Freq: Two times a day (BID) | OROMUCOSAL | Status: DC
  Administered 2017-06-27 (×2): 15 mL via OROMUCOSAL

## 2017-06-27 MED ORDER — PROPOFOL 1000 MG/100ML IV EMUL
5.0000 ug/kg/min | INTRAVENOUS | Status: DC
  Administered 2017-06-27: 30 ug/kg/min via INTRAVENOUS
  Filled 2017-06-27: qty 100

## 2017-06-27 MED ORDER — FENTANYL CITRATE (PF) 100 MCG/2ML IJ SOLN
INTRAMUSCULAR | Status: AC | PRN
  Administered 2017-06-27: 50 ug via INTRAVENOUS

## 2017-06-27 MED ORDER — SODIUM CHLORIDE 0.9 % IV SOLN: 250.0000 mL | INTRAVENOUS | Status: DC | PRN

## 2017-06-27 MED ORDER — ORAL CARE MOUTH RINSE
15.0000 mL | Freq: Four times a day (QID) | OROMUCOSAL | Status: DC
  Administered 2017-06-27 (×2): 15 mL via OROMUCOSAL

## 2017-06-27 MED ORDER — MIDAZOLAM HCL 2 MG/2ML IJ SOLN
INTRAMUSCULAR | Status: AC
  Filled 2017-06-27: qty 2

## 2017-06-27 MED ORDER — ETOMIDATE 2 MG/ML IV SOLN
INTRAVENOUS | Status: AC | PRN
  Administered 2017-06-27: 20 mg via INTRAVENOUS

## 2017-06-27 MED ORDER — POTASSIUM CHLORIDE 10 MEQ/100ML IV SOLN
10.0000 meq | INTRAVENOUS | Status: DC
  Filled 2017-06-27: qty 100

## 2017-06-27 MED ORDER — FENTANYL BOLUS VIA INFUSION
50.0000 ug | INTRAVENOUS | Status: DC | PRN
  Filled 2017-06-27: qty 50

## 2017-06-27 MED ORDER — HEPARIN SODIUM (PORCINE) 5000 UNIT/ML IJ SOLN
5000.0000 [IU] | Freq: Three times a day (TID) | INTRAMUSCULAR | Status: DC
  Administered 2017-06-27 – 2017-06-28 (×4): 5000 [IU] via SUBCUTANEOUS
  Filled 2017-06-27 (×4): qty 1

## 2017-06-27 MED ORDER — ONDANSETRON HCL 4 MG/2ML IJ SOLN
4.0000 mg | Freq: Once | INTRAMUSCULAR | Status: AC
  Administered 2017-06-27: 4 mg via INTRAVENOUS
  Filled 2017-06-27: qty 2

## 2017-06-27 MED ORDER — ACETAMINOPHEN 325 MG PO TABS: 650.0000 mg | ORAL_TABLET | ORAL | Status: DC | PRN

## 2017-06-27 MED ORDER — POTASSIUM CHLORIDE 10 MEQ/100ML IV SOLN
10.0000 meq | INTRAVENOUS | Status: AC
  Administered 2017-06-27 (×3): 10 meq via INTRAVENOUS
  Filled 2017-06-27 (×2): qty 100

## 2017-06-27 MED ORDER — SODIUM CHLORIDE 0.9 % IV SOLN
INTRAVENOUS | Status: DC
  Administered 2017-06-27 (×2): via INTRAVENOUS

## 2017-06-27 MED ORDER — PROPOFOL 1000 MG/100ML IV EMUL
INTRAVENOUS | Status: AC
  Filled 2017-06-27: qty 100

## 2017-06-27 NOTE — ED Triage Notes (Signed)
Per EMS, pt coming from home with suicide attempt via drug dose. Pt took 4 amitriptyline, 20 zoloft, 10 buspirone, 15 minipress, 2 prazosin, 10 benadryl, 4 xanax, and 2 unknown bottles. Pt also has ETOH on board. Pt is slurring words and not wanting to coropoate with staff at this time. Pt stated that her life is messed up and she is having a bi polar problem.

## 2017-06-27 NOTE — H&P (Signed)
PULMONARY / CRITICAL CARE MEDICINE   Name: [name] MRN: [id number] DOB: Jul 20, 1980    ADMISSION DATE:  06/27/2017 CONSULTATION DATE:  06/27/2017  REFERRING MD:  Dr. Preston FleetingGlick  CHIEF COMPLAINT:  Overdose  HISTORY OF PRESENT ILLNESS:   37 year old female with PMH as below, which is significant for bipolar disorder, ETOH abuse, and poly substance abuse. [facility] in the early AM hours of 3/20 after admittedly having taken large doses of several medication. She adamantly denied suicidal ideation, claiming she has been manic for several years and was having trouble sleeping. Overdose was intended only to help her sleep. She admitted to taking alcohol, benadryl, zoloft, and minipress. EMS also noted bottles of amitriptyline, buspirone, terazosin, alprazolam, and two unknown bottles. They also report she took these medications about 30 minutes prior to their arrival. Upon arrival to ED she appeared and acted "drunk". Slowly her mental status deteriorated to where she was no longer protecting her airway and she had one episode of vomiting. Emesis was red/pink in color and more closely resembles dissolved medication as opposed to blood. Poison control was contacted and advised ECG for QTC, which was 559. She was hypotensive as well. PCCM asked to admit.   PAST MEDICAL HISTORY :  She  has a past medical history of Anxiety, Benzodiazepine abuse (HCC), Bipolar 1 disorder (HCC), Depression, Drug abuse (HCC), ETOH abuse, and Polysubstance abuse (HCC).  PAST SURGICAL HISTORY: She  has no past surgical history on file.  Allergies  Allergen Reactions  . Haldol [Haloperidol Lactate] Other (See Comments)    Makes whole body stiff    No current facility-administered medications on file prior to encounter.    Current Outpatient Medications on File Prior to Encounter  Medication Sig  . gabapentin (NEURONTIN) 300 MG capsule Take 1 capsule (300 mg total) by mouth 2 (two) times  daily. For numbness (Patient taking differently: Take 300 mg by mouth 3 (three) times daily. For numbness)  . ondansetron (ZOFRAN ODT) 8 MG disintegrating tablet 8mg  ODT q8 hours prn nausea  . prazosin (MINIPRESS) 2 MG capsule Take 2 capsules (4 mg total) by mouth at bedtime.  . sertraline (ZOLOFT) 25 MG tablet Take 37.5 mg by mouth daily.    FAMILY HISTORY:  Her has no family status information on file.    SOCIAL HISTORY: She  reports that she has been smoking cigarettes.  She has a 34.00 pack-year smoking history. she has never used smokeless tobacco. She reports that she drinks alcohol. She reports that she uses drugs. Drugs: Marijuana and IV.  REVIEW OF SYSTEMS:  Unable as patient is encephalopathic.   SUBJECTIVE:   VITAL SIGNS: BP 107/71   Pulse (!) 105   Temp 98.4 F (36.9 C) (Oral)   Resp 16   Ht 5\' 2"  (1.575 m)   Wt 90.7 kg (200 lb)   SpO2 97%   BMI 36.58 kg/m   HEMODYNAMICS:    VENTILATOR SETTINGS:    INTAKE / OUTPUT: No intake/output data recorded.  PHYSICAL EXAMINATION: General:  Obese female middle aged Neuro:  Somnolent, difficult to arouse. When she is arousable it is only for a few moments of agitation. HEENT:  [place]/AT, Pupils dilated, but equally reactive to light.  Cardiovascular:  Tachy, regular, no MRG Lungs:  Clear bilateral breat sounds Abdomen:  Soft, non-distended Musculoskeletal:  No acute deformity or ROM limitation Skin:  Grossly intact.   LABS:  BMET Recent Labs  Lab 06/27/17 [phone]  NA 138  K 2.9*  CL 105  CO2 22  BUN <5*  CREATININE 0.69  GLUCOSE 117*    Electrolytes Recent Labs  Lab 06/27/17 0229  CALCIUM 8.8*  MG 1.9  PHOS 3.6    CBC Recent Labs  Lab 06/27/17 0238  WBC 6.0  HGB 13.3  HCT 38.6  PLT 336    Coag's No results for input(s): APTT, INR in the last 168 hours.  Sepsis Markers No results for input(s): LATICACIDVEN, PROCALCITON, O2SATVEN in the last 168 hours.  ABG Recent Labs  Lab  06/27/17 0524  PHART 7.252*  PCO2ART 44.0  PO2ART 90.0    Liver Enzymes Recent Labs  Lab 06/27/17 0229  AST 16  ALT 15  ALKPHOS 61  BILITOT 0.4  ALBUMIN 4.2    Cardiac Enzymes No results for input(s): TROPONINI, PROBNP in the last 168 hours.  Glucose No results for input(s): GLUCAP in the last 168 hours.  Imaging No results found.   STUDIES:    CULTURES:   ANTIBIOTICS:   SIGNIFICANT EVENTS: 3/20 > admit for intentional overdose  LINES/TUBES: ETT 3/20 >  DISCUSSION: 37 year old female with history of bipolar disorder admitted after intentional overdose of multiple medications and alcohol. Intubated for airway protection. QTC prolonged.   ASSESSMENT / PLAN:  PULMONARY A: Acute hypercarbic respiratory failure Inability to protect airway  P:   STAT intubation Full vent support CXR for ETT placement ABG VAP bundle  CARDIOVASCULAR A:  Hypotension QTC prolongation - at risk worsening with benadryl, amitriptyline overdose   P:  Telemetry monitoring Serial ECG for QTC monitoring IVF resuscitation May need pressors  RENAL A:   Hypokalemia  P:   Replace K Follow BMP  GASTROINTESTINAL A:   No acute issues  P:   NPO PPI  HEMATOLOGIC A:   No acute issues  P:    INFECTIOUS A:   No acute issues  P:     ENDOCRINE A:   No acute issues    P:     NEUROLOGIC A:   Acute toxic encephalopathy Intentional overdose of alcohol, benadryl, zoloft, and minipress. EMS also noted bottles of amitriptyline, buspirone, terazosin, alprazolam P:   RASS goal: 0 to -1 Fentanyl infusion, PRN Versed Close monitoring in ICU   FAMILY  - Updates:   - Inter-disciplinary family meet or Palliative Care meeting due by:  3/27    Joneen Roach, AGACNP-BC [place] [facility]/Critical [facility] Pager [phone] or [phone]  06/27/2017 6:24 AM

## 2017-06-27 NOTE — ED Notes (Signed)
Pt started vomiting bright red blood. MD Preston FleetingGlick has been notified.

## 2017-06-27 NOTE — Progress Notes (Signed)
Patient transferred to 4N18 without complications. Vital signs stable throughout. No complications. Report given to [facility], RRT. RT will continue to monitor.

## 2017-06-27 NOTE — ED Notes (Signed)
Pt is currently disoriented and confused. Pt is screaming mutiple cuss words at staff. Security called. Brain, RN and Shanda BumpsJessica, RN at bedside to restrain pt.

## 2017-06-27 NOTE — Procedures (Signed)
Extubation Procedure Note  Patient Details:   Name: Elease HashimotoChristie Lynn Riggan DOB: [birth year] MRN: [id number]   Airway Documentation:     Evaluation  O2 sats: stable throughout Complications: No apparent complications Patient did tolerate procedure well. Bilateral Breath Sounds: Clear, Diminished   Yes   Patient was extubated to a 4L [place] without any complications, dyspnea or stridor noted. Patient was instructed on IS x 5, highest goal achieved was 1500mL.  Carlynn SpryCobb, Juanisha Bautch L 06/27/2017, 3:35 PM

## 2017-06-27 NOTE — Progress Notes (Signed)
RBV Madison, RN at 0530 on 06/27/2017.

## 2017-06-27 NOTE — Procedures (Signed)
Endotracheal Intubation Procedure Note Indication for endotracheal intubation: airway compromise Sedation: etomidate and midazolam Paralytic: succinylcholine Equipment: Macintosh 3 laryngoscope blade and 7.475mm cuffed endotracheal tube; secured at 23cm  Cricoid Pressure: yes Number of attempts: 1 ETT location confirmed by by auscultation, by CXR and ETCO2 monitor.

## 2017-06-27 NOTE — ED Notes (Signed)
ED Provider at bedside. 

## 2017-06-27 NOTE — ED Notes (Signed)
X-ray at bedside

## 2017-06-27 NOTE — Progress Notes (Signed)
12 lead EKG done. Rate 93, PR , QRS 82ms, QTc 
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
## 2017-06-27 NOTE — Progress Notes (Signed)
Arrived to find patient with snoring respirations, not protecting her airway, high risk for aspiration as she had recently been vomiting. Will proceed with intubation.

## 2017-06-27 NOTE — ED Notes (Signed)
Pt has stated multiple times that she was "Just trying to go to sleep"

## 2017-06-27 NOTE — Progress Notes (Signed)
Pt was asleep during visit. Said a silent prayer for her. Stayed with pt for a few minutes so sitter could take a break. -Secondary school teacherChaplin Jacori Mulrooney

## 2017-06-27 NOTE — Progress Notes (Signed)
Pt's ETT was retracted 2cm per CXR results

## 2017-06-27 NOTE — ED Notes (Addendum)
Dellis Anesatalie Litrell (Girlfriend) [phone]

## 2017-06-27 NOTE — Progress Notes (Signed)
RBV Madison, RN at [phone] on 06/27/2017. Pt's RR was increased to 22 and FIO2 decreased to 60%.

## 2017-06-27 NOTE — ED Notes (Signed)
[employer] at bedside 

## 2017-06-27 NOTE — ED Notes (Signed)
Intensivist at bedside.

## 2017-06-27 NOTE — ED Notes (Signed)
Restraints removed.
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
## 2017-06-27 NOTE — ED Provider Notes (Signed)
[facility] EMERGENCY DEPARTMENT Provider Note   CSN: [id number] Arrival date & time: 06/27/17  0214     History   Chief Complaint Chief Complaint  Patient presents with  . Drug Overdose    HPI [name] is a 37 y.o. female.  The history is provided by the EMS personnel and the patient. The history is limited by the condition of the patient (Psychiatric disorder).  She has history of bipolar disorder, polysubstance abuse and comes in after taking an overdose of multiple medications at home.  Patient states that she has been manic for several years and has been having difficulty sleeping and she was just taking medications to get her to sleep.  She admits to alcohol, diphenhydramine, Zoloft, Minipress.  EMS also notes amitriptyline, buspirone, terazosin, alprazolam, and 2 other unknown bottles.  Patient adamantly denies any suicidal intent.  She does admit to being depressed with crying spells and chronic sleep disturbance.  She denies hallucinations.  She denies suicidal or homicidal ideation.  Past Medical History:  Diagnosis Date  . Anxiety   . Benzodiazepine abuse ([place])   . Bipolar 1 disorder ([place])   . Depression   . Drug abuse ([place])   . ETOH abuse   . Polysubstance abuse [facility])     Patient Active Problem List   Diagnosis Date Noted  . Intermittent explosive disorder 11/22/2016  . Chronic midline low back pain 05/29/2016  . Substance induced mood disorder ([place]) 06/28/2015  . 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 Polysubstance dependence ([place]) 06/06/2015  . Polysubstance dependence including opioid type drug, episodic abuse ([place]) 06/03/2015  . Polysubstance abuse ([place]) 11/01/2013  . Benzodiazepine dependence ([place]) 11/01/2013  . Bipolar I disorder, most recent episode (or current) manic ([place]) 02/25/2013  . Substance abuse/dependence 02/21/2013    History reviewed. No pertinent surgical history.  OB History    No  data available       Home Medications    Prior to Admission medications   Medication Sig Start Date End Date Taking? Authorizing Provider  gabapentin (NEURONTIN) 300 MG capsule Take 1 capsule (300 mg total) by mouth 2 (two) times daily. For numbness Patient taking differently: Take 300 mg by mouth 3 (three) times daily. For numbness 06/05/16   Alfonse Spruce, FNP  ondansetron (ZOFRAN ODT) 8 MG disintegrating tablet 31m ODT q8 hours prn nausea 10/19/16   Palumbo, April, MD  prazosin (MINIPRESS) 2 MG capsule Take 2 capsules (4 mg total) by mouth at bedtime. 07/21/16   AKerrie Buffalo NP  sertraline (ZOLOFT) 25 MG tablet Take 37.5 mg by mouth daily.    [provider]    Family History History reviewed. No pertinent family history.  Social History Social History   Tobacco Use  . Smoking status: Current Every Day Smoker    Packs/day: 2.00    Years: 17.00    Pack years: 34.00    Types: Cigarettes  . Smokeless tobacco: Never Used  Substance Use Topics  . Alcohol use: Yes    Comment: +63  . Drug use: Yes    Types: Marijuana, IV    Comment: UDS + THC     Allergies   Haldol [haloperidol lactate]   Review of Systems Review of Systems  Unable to perform ROS: Psychiatric disorder     Physical Exam Updated Vital Signs BP 136/80   Pulse (!) 145   Temp 98.4 F (36.9 C) (Oral)   Resp (!) 22  Ht _0  (1.575 m)   Wt 90.7 kg (200 lb)   SpO2 97%   BMI 36.58 kg/m   Physical Exam  Nursing note and vitals reviewed.  37 year old female, resting comfortably and in no acute distress. Vital signs are significant for elevated heart rate and respiratory rate. Oxygen saturation is 97%, which is normal. Head is normocephalic and atraumatic. PERRLA, EOMI. Oropharynx is clear. Neck is nontender and supple without adenopathy or JVD. Back is nontender and there is no CVA tenderness. Lungs are clear without rales, wheezes, or rhonchi. Chest is nontender. Heart has  regular rate and rhythm without murmur. Abdomen is soft, flat, nontender without masses or hepatosplenomegaly and peristalsis is normoactive. Extremities have no cyanosis or edema, full range of motion is present. Skin is warm and dry without rash. Neurologic: She is awake but with slurred speech consistent with alcohol intoxication.  Speech is spontaneous and appropriate, cranial nerves are intact, there are no motor or sensory deficits.  ED Treatments / Results  Labs (all labs ordered are listed, but only abnormal results are displayed) Labs Reviewed  COMPREHENSIVE METABOLIC PANEL - Abnormal; Notable for the following components:      Result Value   Potassium 2.9 (*)    Glucose, Bld 117 (*)    BUN <5 (*)    Calcium 8.8 (*)    All other components within normal limits  ETHANOL - Abnormal; Notable for the following components:   Alcohol, Ethyl (B) 129 (*)    All other components within normal limits  CBC WITH DIFFERENTIAL/PLATELET - Abnormal; Notable for the following components:   MCH 34.1 (*)    All other components within normal limits  ACETAMINOPHEN LEVEL - Abnormal; Notable for the following components:   Acetaminophen (Tylenol), Serum <10 (*)    All other components within normal limits  I-STAT ARTERIAL BLOOD GAS, ED - Abnormal; Notable for the following components:   pH, Arterial 7.252 (*)    Bicarbonate 19.4 (*)    TCO2 21 (*)    Acid-base deficit 8.0 (*)    All other components within normal limits  SALICYLATE LEVEL  MAGNESIUM  PHOSPHORUS  RAPID URINE DRUG SCREEN, HOSP PERFORMED  BLOOD GAS, ARTERIAL  BLOOD GAS, ARTERIAL  HIV ANTIBODY (ROUTINE TESTING)  TRIGLYCERIDES  MAGNESIUM  I-STAT BETA HCG BLOOD, ED ([facility], [facility], [facility] ONLY)    EKG  EKG Interpretation  Date/Time:  Wednesday June 27 2017 04:54:55 EDT Ventricular Rate:  136 PR Interval:    QRS Duration: 82 QT Interval:  371 QTC Calculation: 559 R Axis:   20 Text Interpretation:  Sinus tachycardia Probable  left atrial enlargement Low voltage, precordial leads Borderline T abnormalities, anterior leads Prolonged QT interval Baseline wander in lead(s) V1 When compared with ECG of 07/17/2016, HEART RATE has increased QT has lengthened Nonspecific T wave abnormality is now present Confirmed by Delora Fuel ([id number]) on 06/27/2017 5:10:21 AM       Radiology Portable Chest X-ray  Result Date: 06/27/2017 CLINICAL DATA:  Endotracheal tube placement. EXAM: PORTABLE CHEST 1 VIEW COMPARISON:  07/17/2016 FINDINGS: The endotracheal tube is at the carina. Enteric tube in place with tip and side-port below the diaphragm. Low lung volumes with bibasilar atelectasis. Upper normal heart size. No pulmonary edema. No pneumothorax or pleural effusion. IMPRESSION: 1. Endotracheal tube tip at the carina, recommend retraction of 2 cm. 2. Enteric tube tip and side-port below the diaphragm in the stomach. 3. Low lung volumes with bibasilar atelectasis. Prominent heart  size likely accentuated by technique. These results were called by telephone at the time of interpretation on 06/27/2017 at 1:77 am to Dr. Roxanne Mins , who verbally acknowledged these results. Electronically Signed   By: Jeb Levering M.D.   On: 06/27/2017 06:29    Procedures Procedures  CRITICAL CARE Performed by: Delora Fuel Total critical care time: 165 minutes Critical care time was exclusive of separately billable procedures and treating other patients. Critical care was necessary to treat or prevent imminent or life-threatening deterioration. Critical care was time spent personally by me on the following activities: development of treatment plan with patient and/or surrogate as well as nursing, discussions with consultants, evaluation of patient's response to treatment, examination of patient, obtaining history from patient or surrogate, ordering and performing treatments and interventions, ordering and review of laboratory studies, ordering and review of  radiographic studies, pulse oximetry and re-evaluation of patient's condition.  Medications Ordered in ED Medications  potassium chloride 10 mEq in 100 mL IVPB (10 mEq Intravenous New Bag/Given 06/27/17 0645)  magnesium sulfate IVPB 1 g 100 mL (1 g Intravenous New Bag/Given 06/27/17 0613)  0.9 %  sodium chloride infusion ( Intravenous New Bag/Given 06/27/17 0621)  midazolam (VERSED) 2 MG/2ML injection (not administered)  chlorhexidine gluconate (MEDLINE KIT) (PERIDEX) 0.12 % solution 15 mL (not administered)  MEDLINE mouth rinse (not administered)  fentaNYL (SUBLIMAZE) injection 50 mcg (not administered)  fentaNYL 2521mg in NS 2564m(1044mml) infusion-PREMIX (not administered)  fentaNYL (SUBLIMAZE) bolus via infusion 50 mcg (not administered)  midazolam (VERSED) injection 2 mg (not administered)  docusate (COLACE) 50 MG/5ML liquid 100 mg (not administered)  midazolam (VERSED) injection 2 mg (not administered)  0.9 %  sodium chloride infusion (not administered)  heparin injection 5,000 Units (not administered)  pantoprazole (PROTONIX) injection 40 mg (not administered)  acetaminophen (TYLENOL) tablet 650 mg (not administered)  propofol (DIPRIVAN) 1000 MG/100ML infusion (30 mcg/kg/min  90.7 kg Intravenous Rate/Dose Change 06/27/17 0619)  sodium chloride 0.9 % bolus 1,000 mL (0 mLs Intravenous Stopped 06/27/17 0500)  ondansetron (ZOFRAN) injection 4 mg (4 mg Intravenous Given 06/27/17 0343)  sodium chloride 0.9 % bolus 1,000 mL (0 mLs Intravenous Stopped 06/27/17 0621)  midazolam (VERSED) injection (2 mg Intravenous Given 06/27/17 0546)  etomidate (AMIDATE) injection (20 mg Intravenous Given 06/27/17 0548)  succinylcholine (ANECTINE) injection (100 mg Intravenous Given 06/27/17 0548)  fentaNYL (SUBLIMAZE) injection (50 mcg Intravenous Given 06/27/17 0554)  fentaNYL (SUBLIMAZE) injection (50 mcg Intravenous Given 06/27/17 0602)  propofol (DIPRIVAN) 1000 MG/100ML infusion (20 mcg/kg/min  90.7 kg  Intravenous New Bag/Given 06/27/17 0603)     Initial Impression / Assessment and Plan / ED Course  I have reviewed the triage vital signs and the nursing notes.  Pertinent labs & imaging results that were available during my care of the patient were reviewed by me and considered in my medical decision making (see chart for details).  Overdose of multiple drugs.  Patient adamantly denying suicidal intent.  Baseline labs are being obtained including ethanol, salicylate, acetaminophen levels.  Poison control be consulted.  Old records are reviewed, and she has multiple psychiatric related ED visits including drug overdose.  She does have several hospitalizations for depression and drug use.  3:09 AM Poison control has been consulted.  Need ECG to evaluate for QRS widening given ingestion of diphenhydramine and amitriptyline.  She will need IV fluids.  We will need to observe for 4-6 hours.  If maintains normal vital signs through that time including normal heart rate,  then would be cleared for psychiatric care.  If any QRS widening, need to treat with sodium bicarbonate.  3:26 AM Patient became agitated and disconnected her IV and tried to leave.  Advised of need for treatment, but she is still agitated.  Restraints were needed for her protection and protection of staff.  4:07 AM She has vomited once.  Emesis was reported to be pinkish.  She was given ondansetron for nausea.  Following that, she is no longer vomiting, but is more somnolent.  Still maintaining good oxygen saturation on room air.  She will need to be watched closely.  4:50 AM Mental status is unchanged, and oxygen saturation is continued to be adequate, but blood pressure is dropping.  Laboratory workup does show hypokalemia and she is given potassium supplementation.  She is being given additional fluids.  Because of blood pressure trend, I do not feel she is going to be medically cleared for psychiatric treatment and will need to be  admitted.  She likely will need pressors.  Case is discussed with Dr. Oletta Darter of critical care service who agrees to evaluate the patient for admission to ICU.  5:30 AM Critical care has come to evaluate the patient.  They are electing to intubate to protect airway.  Final Clinical Impressions(s) / ED Diagnoses   Final diagnoses:  Drug overdose, undetermined intent, initial encounter  Alcohol intoxication, uncomplicated [facility])    ED Discharge Orders    None       Delora Fuel, MD 22/24/11 [phone]

## 2017-06-28 ENCOUNTER — Inpatient Hospital Stay (HOSPITAL_COMMUNITY): Payer: Self-pay

## 2017-06-28 DIAGNOSIS — T1491XA Suicide attempt, initial encounter: Secondary | ICD-10-CM

## 2017-06-28 DIAGNOSIS — T43222A Poisoning by selective serotonin reuptake inhibitors, intentional self-harm, initial encounter: Secondary | ICD-10-CM

## 2017-06-28 DIAGNOSIS — F1910 Other psychoactive substance abuse, uncomplicated: Secondary | ICD-10-CM

## 2017-06-28 DIAGNOSIS — T43592A Poisoning by other antipsychotics and neuroleptics, intentional self-harm, initial encounter: Secondary | ICD-10-CM

## 2017-06-28 DIAGNOSIS — T424X2A Poisoning by benzodiazepines, intentional self-harm, initial encounter: Secondary | ICD-10-CM

## 2017-06-28 DIAGNOSIS — F319 Bipolar disorder, unspecified: Secondary | ICD-10-CM

## 2017-06-28 DIAGNOSIS — T450X2A Poisoning by antiallergic and antiemetic drugs, intentional self-harm, initial encounter: Principal | ICD-10-CM

## 2017-06-28 DIAGNOSIS — F10129 Alcohol abuse with intoxication, unspecified: Secondary | ICD-10-CM

## 2017-06-28 DIAGNOSIS — Y906 Blood alcohol level of 120-199 mg/100 ml: Secondary | ICD-10-CM

## 2017-06-28 DIAGNOSIS — Z79899 Other long term (current) drug therapy: Secondary | ICD-10-CM

## 2017-06-28 LAB — BLOOD GAS, ARTERIAL
Acid-base deficit: 1.8 mmol/L (ref 0.0–2.0)
Bicarbonate: 22.4 mmol/L (ref 20.0–28.0)
FIO2: 21
O2 Saturation: 96.4 %
PH ART: 7.389 (ref 7.350–7.450)
PO2 ART: 80.1 mmHg — AB (ref 83.0–108.0)
Patient temperature: 98.4
pCO2 arterial: 37.9 mmHg (ref 32.0–48.0)

## 2017-06-28 LAB — CBC
HCT: 33 % — ABNORMAL LOW (ref 36.0–46.0)
HEMOGLOBIN: 10.6 g/dL — AB (ref 12.0–15.0)
MCH: 32.7 pg (ref 26.0–34.0)
MCHC: 32.1 g/dL (ref 30.0–36.0)
MCV: 101.9 fL — AB (ref 78.0–100.0)
Platelets: 259 10*3/uL (ref 150–400)
RBC: 3.24 MIL/uL — ABNORMAL LOW (ref 3.87–5.11)
RDW: 13.5 % (ref 11.5–15.5)
WBC: 7.4 10*3/uL (ref 4.0–10.5)

## 2017-06-28 LAB — BASIC METABOLIC PANEL
Anion gap: 4 — ABNORMAL LOW (ref 5–15)
CHLORIDE: 110 mmol/L (ref 101–111)
CO2: 23 mmol/L (ref 22–32)
CREATININE: 0.59 mg/dL (ref 0.44–1.00)
Calcium: 7.7 mg/dL — ABNORMAL LOW (ref 8.9–10.3)
GFR calc Af Amer: >60 mL/min (ref >60)
GFR calc non Af Amer: >60 mL/min (ref >60)
GLUCOSE: 91 mg/dL (ref 65–99)
Potassium: 3.3 mmol/L — ABNORMAL LOW (ref 3.5–5.1)
SODIUM: 137 mmol/L (ref 135–145)

## 2017-06-28 LAB — MAGNESIUM: MAGNESIUM: 1.5 mg/dL — AB (ref 1.7–2.4)

## 2017-06-28 LAB — PHOSPHORUS: Phosphorus: 2.5 mg/dL (ref 2.5–4.6)

## 2017-06-28 MED ORDER — LORAZEPAM 2 MG/ML IJ SOLN: 2.0000 mg | INTRAMUSCULAR | Status: DC | PRN

## 2017-06-28 MED ORDER — FOLIC ACID 1 MG PO TABS: 1.0000 mg | ORAL_TABLET | Freq: Every day | ORAL | 3 refills | Status: AC

## 2017-06-28 MED ORDER — VITAMIN B-1 100 MG PO TABS
100.0000 mg | ORAL_TABLET | Freq: Every day | ORAL | Status: DC
  Administered 2017-06-28: 100 mg via ORAL
  Filled 2017-06-28: qty 1

## 2017-06-28 MED ORDER — ADULT MULTIVITAMIN W/MINERALS CH: 1.0000 | ORAL_TABLET | Freq: Every day | ORAL | 3 refills | Status: DC

## 2017-06-28 MED ORDER — POTASSIUM CHLORIDE 20 MEQ PO PACK
20.0000 meq | PACK | Freq: Once | ORAL | Status: AC
  Administered 2017-06-28: 20 meq via ORAL
  Filled 2017-06-28: qty 1

## 2017-06-28 MED ORDER — THIAMINE HCL 100 MG PO TABS: 100.0000 mg | ORAL_TABLET | Freq: Every day | ORAL | 3 refills | Status: DC

## 2017-06-28 MED ORDER — LORAZEPAM 1 MG PO TABS
2.0000 mg | ORAL_TABLET | ORAL | Status: DC | PRN
  Administered 2017-06-28 (×2): 2 mg via ORAL
  Filled 2017-06-28 (×2): qty 2

## 2017-06-28 MED ORDER — ADULT MULTIVITAMIN W/MINERALS CH
1.0000 | ORAL_TABLET | Freq: Every day | ORAL | Status: DC
  Administered 2017-06-28: 1 via ORAL
  Filled 2017-06-28: qty 1

## 2017-06-28 NOTE — Care Management Note (Signed)
Case Management Note  Patient Details  Name: Elease HashimotoChristie Lynn Cassady MRN: [id number] Date of Birth: 15-Apr-1980  Subjective/Objective: Pt admitted on 06/27/17 after overdosing on multiple Rx medications.  PTA, pt independent of ADLS.                     Action/Plan: Psych consult pending; sitter at bedside.  Will follow for discharge planning as pt progresses.   Expected Discharge Date:                  Expected Discharge Plan:     In-House Referral:  Clinical Social Work  Discharge planning Services  CM Consult  Post Acute Care Choice:    Choice offered to:     DME Arranged:    DME Agency:     HH Arranged:    HH Agency:     Status of Service:  In process, will continue to follow  If discussed at Long Length of Stay Meetings, dates discussed:    Additional Comments:  Quintella BatonJulie W. Abayomi Pattison, RN, BSN  Trauma/Neuro ICU Case Manager [phone]

## 2017-06-28 NOTE — Progress Notes (Signed)
PULMONARY / CRITICAL CARE MEDICINE   Name: [name] MRN: [id number] DOB: 05/10/1980    ADMISSION DATE:  06/27/2017   CHIEF COMPLAINT: Polysubstance overdose  HISTORY OF PRESENT ILLNESS:        37 year old female with PMH as below, which is significant for bipolar disorder, ETOH abuse, and poly substance abuse. [facility] in the early AM hours of 3/20 after admittedly having taken large doses of several medication. She adamantly denied suicidal ideation, claiming she has been manic for several years and was having trouble sleeping. Overdose was intended only to help her sleep. She admitted to taking alcohol, benadryl, zoloft, and minipress. EMS also noted bottles of amitriptyline, buspirone, terazosin, alprazolam, and two unknown bottles. They also report she took these medications about 30 minutes prior to their arrival. Upon arrival to ED she appeared and acted "drunk". Slowly her mental status deteriorated to where she was no longer protecting her airway and she had one episode of vomiting. Emesis was red/pink in color and more closely resembles dissolved medication as opposed to blood. Poison control was contacted and advised ECG for QTC, which was 559. She was hypotensive as well. PCCM asked to admit.   She was extubated yesterday afternoon.  She denies anything other than her usual smoker's cough and is not having any difficulties with dyspnea.  She does report that she has some chronic pain in her left ankle since a fall following a catatonic episode last year.  He tells me that she was drinking heavily yesterday and is not aware of what led her up to taking multiple medications.  She tells me she had been drinking because she had been extremely anxious and not able to find any peace.  QTc interval this morning is normal    PAST MEDICAL HISTORY :  She  has a past medical history of Anxiety, Benzodiazepine abuse (HCC), Bipolar 1 disorder (HCC), Depression, Drug  abuse (HCC), ETOH abuse, and Polysubstance abuse (HCC).  PAST SURGICAL HISTORY: She  has no past surgical history on file.  Allergies  Allergen Reactions  . Haldol [Haloperidol Lactate] Other (See Comments)    Makes whole body stiff    No current facility-administered medications on file prior to encounter.    Current Outpatient Medications on File Prior to Encounter  Medication Sig  . busPIRone (BUSPAR) 10 MG tablet Take 10 mg by mouth 3 (three) times daily.  Marland Kitchen. gabapentin (NEURONTIN) 300 MG capsule Take 1 capsule (300 mg total) by mouth 2 (two) times daily. For numbness (Patient taking differently: Take 300 mg by mouth 3 (three) times daily. For numbness)  . prazosin (MINIPRESS) 2 MG capsule Take 2 capsules (4 mg total) by mouth at bedtime.  . sertraline (ZOLOFT) 25 MG tablet Take 37.5 mg by mouth daily.    FAMILY HISTORY:  Her has no family status information on file.    SOCIAL HISTORY: She  reports that she has been smoking cigarettes.  She has a 34.00 pack-year smoking history. She has never used smokeless tobacco. She reports that she drinks alcohol. She reports that she has current or past drug history. Drugs: Marijuana and IV.  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  She is sitting up in the bed in no acute distress. Neuro: Oriented x3 and appropriate for me using all fours pupils are equal Cardiovascular: S1 and S2 are regular without murmur rub or gallop Lungs: Respirations are unlabored, there are a few scattered  rhonchi, no wheezes, there is good air movement throughout Abdomen: Abdomen is soft without any organomegaly masses tenderness guarding or rebound Musculoskeletal: Do not appreciate any tenderness or deformity of the left ankle she appears to have a normal range of motion there is no effusion or warmth  LABS:  BMET Recent Labs  Lab 06/27/17 0229 06/27/17 1542 06/28/17 0540  NA 138 141 137  K 2.9* 3.3* 3.3*  CL 105 114* 110  CO2 22 19* 23  BUN <5* 6 <5*  CREATININE 0.69 0.66 0.59  GLUCOSE 117* 80 91    Electrolytes Recent Labs  Lab 06/27/17 0229 06/27/17 0625 06/27/17 1542 06/28/17 0540  CALCIUM 8.8*  --  6.9* 7.7*  MG 1.9 1.5*  --  1.5*  PHOS 3.6  --   --  2.5    CBC Recent Labs  Lab 06/27/17 0238 06/28/17 0540  WBC 6.0 7.4  HGB 13.3 10.6*  HCT 38.6 33.0*  PLT 336 259    Coag's No results for input(s): APTT, INR in the last 168 hours.  Sepsis Markers No results for input(s): LATICACIDVEN, PROCALCITON, O2SATVEN in the last 168 hours.  ABG Recent Labs  Lab 06/27/17 0524 06/27/17 0703 06/28/17 0455  PHART 7.252* 7.251* 7.389  PCO2ART 44.0 52.2* 37.9  PO2ART 90.0 398.0* 80.1*    Liver Enzymes Recent Labs  Lab 06/27/17 0229  AST 16  ALT 15  ALKPHOS 61  BILITOT 0.4  ALBUMIN 4.2    Cardiac Enzymes No results for input(s): TROPONINI, PROBNP in the last 168 hours.  Glucose No results for input(s): GLUCAP in the last 168 hours.  Imaging No results found.   STUDIES:  QTC is 0. 465 seconds    DISCUSSION:      This is a 37 year old with a history of bipolar disorder who tells me that she was having difficulties getting any rest.  She indulged in binge drinking but this did not bring any calm so she took numerous medications.  Is not aware of what she did at the time and although this may not have been a intentional act of self-harm clearly her judgment is sufficiently impaired that she is of risk to herself.  She is medically stable for  transfer to a psychiatric facility if necessary.  I have asked psychiatry to evaluate her today.  Penny Pia, MD Pulmonary and Critical Care Medicine [facility] Pager: [phone]  06/28/2017, 12:12 PM

## 2017-06-28 NOTE — Consult Note (Addendum)
[facility] Face-to-Face Psychiatry Consult   Reason for Consult:  Suicide attempt by drug overdose  Referring Physician:  Dr. Jimmey Ralph Patient Identification: [name] MRN:  [id number] Principal Diagnosis: Drug overdose Diagnosis:   Patient Active Problem List   Diagnosis Date Noted  . Drug overdose [T50.901A] 06/27/2017  . Intermittent explosive disorder [F63.81] 11/22/2016  . Chronic midline low back pain [M54.5, G89.29] 05/29/2016  . Substance induced mood disorder ([place]) [F19.94] 06/28/2015  . 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 Polysubstance dependence ([place]) [F19.20] 06/06/2015  . Polysubstance dependence including opioid type drug, episodic abuse ([place]) [F11.20, F19.20] 06/03/2015  . Polysubstance abuse ([place]) [F19.10] 11/01/2013  . 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 Bipolar I disorder, most recent episode (or current) manic ([place]) [F31.10] 02/25/2013  . Substance abuse/dependence [IMO0002] 02/21/2013    Total Time spent with patient: 1 hour  Subjective:   [name] is a 37 y.o. female patient admitted with suicide attempt by overdosing on several medications.  HPI:   Per chart review, patient reports overdosing on several medications (Benadryl, Zoloft and Prazosin) in the setting of alcohol use. EMS also noted bottles of Amitriptyline, Buspar, Terazosin, Xanax and two unknown bottles. It is also documented in the medical record that she took Bendaryl (#10), Zoloft (#20), Buspar (#10) and Xanax (#4). She reports taking several medications to "control her mania." She required intubation for airway protection. QTc was 559 yesterday and currently 435. UDS was positive for benzodiazepines and THC. Miller was 129 on admission. She has a history of bipolar 1 disorder, polysubstance abuse and (alcohol and benzodiazepines) and anxiety. Home medications include Zoloft 37.5 mg daily, Prazosin 4 mg qhs, Gabapentin 300 BID and  Buspar 10 mg TID.   Of note, patient was last seen on 2/13 in [facility] for injury with concern for self-injurious behavior. She reported becoming upset while intoxicated and hit her arm with a wine bottle which resulted in a laceration to her arm. She denied any intention to harm self. She was psychiatrically cleared and discharged to follow up with her outpatient provider.   On interview, [name] reports that she made the "biggest mistake ever." She reports that she is an alcoholic and she should never had drank yesterday because she was not in her "right mind" when she ingested her prescription medications. She reports that her family has been a stressor and she is living with her ex-girlfriend although they are both seeing someone. She is glad to be alive and regrets her overdose. She reports a history of impulsive behaviors when drinking so she has significantly reduced her alcohol use. She reports drinking about 5 times over the past year. She denies SI, HI or AVH. She reports problems with sleep due to ongoing stressors. She reports taking a friend's Xanax for anxiety when asked about her UDS which was positive for benzodiazepines. She regularly follows up with [facility] for medication management. Her last appointment was a month ago.   Patient provided permission to speak to her girlfriend, [name] for collateral. [name] reports that she does not feel like she is a risk of harm to self and yesterday was an impulsive overdose due to alcohol intoxication. She did not endorse thoughts to harm self prior to overdose. She has been doing better and has been involved in groups at [facility]. She reached out for help after overdose and was willing to come to the hospital. She agrees to monitor patient for safety until follow up with  her outpatient provider.    Past Psychiatric History: Bipolar 1 disorder, polysubstance abuse and (alcohol and benzodiazepines) and anxiety.  Risk to Self: None. Denies SI.  Risk  to Others:  None. Denies HI.  Prior Inpatient Therapy:  She was hospitalized in 2017 for detox from Xanax. She reports a history of suicide attempt by drug overdose several years ago.  Prior Outpatient Therapy:    Past Medical History:  Past Medical History:  Diagnosis Date  . Anxiety   . Benzodiazepine abuse ([place])   . Bipolar 1 disorder ([place])   . Depression   . Drug abuse ([place])   . ETOH abuse   . Polysubstance abuse ([place])    History reviewed. No pertinent surgical history. Family History: History reviewed. No pertinent family history. Family Psychiatric  History: Mother-Borderline personality disorder and father-PTSD.   Social History:  Social History   Substance and Sexual Activity  Alcohol Use Yes   Comment: +63     Social History   Substance and Sexual Activity  Drug Use Yes  . Types: Marijuana, IV   Comment: UDS + THC    Social History   Socioeconomic History  . Marital status: Single    Spouse name: Not on file  . Number of children: Not on file  . Years of education: Not on file  . Highest education level: Not on file  Occupational History  . Not on file  Social Needs  . Financial resource strain: Not on file  . Food insecurity:    Worry: Not on file    Inability: Not on file  . Transportation needs:    Medical: Not on file    Non-medical: Not on file  Tobacco Use  . Smoking status: Current Every Day Smoker    Packs/day: 2.00    Years: 17.00    Pack years: 34.00    Types: Cigarettes  . Smokeless tobacco: Never Used  Substance and Sexual Activity  . Alcohol use: Yes    Comment: +63  . Drug use: Yes    Types: Marijuana, IV    Comment: UDS + THC  . Sexual activity: Never  Lifestyle  . Physical activity:    Days per week: Not on file    Minutes per session: Not on file  . Stress: Not on file  Relationships  . Social connections:    Talks on phone: Not on file    Gets together: Not on file    Attends religious service: Not on file    Active  member of club or organization: Not on file    Attends meetings of clubs or organizations: Not on file    Relationship status: Not on file  Other Topics Concern  . Not on file  Social History Narrative  . Not on file   Additional Social History: She lives at home with her ex-girlfriend. She has a history of heavy alcohol use but reports significantly reducing use and is attending mental health drug court. She is on probation for assaulting a Engineer, structural a year ago.     Allergies:   Allergies  Allergen Reactions  . Haldol [Haloperidol Lactate] Other (See Comments)    Makes whole body stiff    Labs:  Results for orders placed or performed during the hospital encounter of 06/27/17 (from the past 48 hour(s))  Comprehensive metabolic panel     Status: Abnormal   Collection Time: 06/27/17  2:29 AM  Result Value Ref Range   Sodium 138  135 - 145 mmol/L   Potassium 2.9 (L) 3.5 - 5.1 mmol/L   Chloride 105 101 - 111 mmol/L   CO2 22 22 - 32 mmol/L   Glucose, Bld 117 (H) 65 - 99 mg/dL   BUN <5 (L) 6 - 20 mg/dL   Creatinine, Ser 0.69 0.44 - 1.00 mg/dL   Calcium 8.8 (L) 8.9 - 10.3 mg/dL   Total Protein 7.0 6.5 - 8.1 g/dL   Albumin 4.2 3.5 - 5.0 g/dL   AST 16 15 - 41 U/L   ALT 15 14 - 54 U/L   Alkaline Phosphatase 61 38 - 126 U/L   Total Bilirubin 0.4 0.3 - 1.2 mg/dL   GFR calc non Af Amer >60 >60 mL/min   GFR calc Af Amer >60 >60 mL/min    Comment: (NOTE) The eGFR has been calculated using the CKD EPI equation. This calculation has not been validated in all clinical situations. eGFR's persistently <60 mL/min signify possible Chronic Kidney Disease.    Anion gap 11 5 - 15    Comment: Performed at [facility] [address] [place], [place] [address]  Ethanol     Status: Abnormal   Collection Time: 06/27/17  2:29 AM  Result Value Ref Range   Alcohol, Ethyl (B) 129 (H) <10 mg/dL    Comment:        LOWEST DETECTABLE LIMIT FOR SERUM ALCOHOL IS 10 mg/dL FOR MEDICAL  PURPOSES ONLY Performed at [facility], [place] [address], [place], [place] [address]   Rapid urine drug screen (hospital performed)     Status: Abnormal   Collection Time: 06/27/17  2:29 AM  Result Value Ref Range   Opiates NONE DETECTED NONE DETECTED   Cocaine NONE DETECTED NONE DETECTED   Benzodiazepines POSITIVE (A) NONE DETECTED   Amphetamines NONE DETECTED NONE DETECTED   Tetrahydrocannabinol POSITIVE (A) NONE DETECTED   Barbiturates NONE DETECTED NONE DETECTED    Comment: (NOTE) DRUG SCREEN FOR MEDICAL PURPOSES ONLY.  IF CONFIRMATION IS NEEDED FOR ANY PURPOSE, NOTIFY LAB WITHIN 5 DAYS. LOWEST DETECTABLE LIMITS FOR URINE DRUG SCREEN Drug Class                     Cutoff (ng/mL) Amphetamine and metabolites    1000 Barbiturate and metabolites    200 Benzodiazepine                 378 Tricyclics and metabolites     300 Opiates and metabolites        300 Cocaine and metabolites        300 THC                            50 Performed at [facility], [place] [address], [place], [place] [address]   Salicylate level     Status: None   Collection Time: 06/27/17  2:29 AM  Result Value Ref Range   Salicylate Lvl <2.7 2.8 - 30.0 mg/dL    Comment: Performed at [facility] [address] [place], [place] [address]  Acetaminophen level     Status: Abnormal   Collection Time: 06/27/17  2:29 AM  Result Value Ref Range   Acetaminophen (Tylenol), Serum <10 (L) 10 - 30 ug/mL    Comment:        THERAPEUTIC CONCENTRATIONS VARY SIGNIFICANTLY. A RANGE OF 10-30 ug/mL MAY BE AN EFFECTIVE CONCENTRATION FOR  MANY PATIENTS. HOWEVER, SOME ARE BEST TREATED AT CONCENTRATIONS OUTSIDE THIS RANGE. ACETAMINOPHEN CONCENTRATIONS >150 ug/mL AT 4 HOURS AFTER INGESTION AND >50 ug/mL AT 12 HOURS AFTER INGESTION ARE OFTEN ASSOCIATED WITH TOXIC REACTIONS. Performed at [facility], [place] [address] [place], [place] [address]   Magnesium     Status: None   Collection Time:  06/27/17  2:29 AM  Result Value Ref Range   Magnesium 1.9 1.7 - 2.4 mg/dL    Comment: Performed at [facility], [place] [address], [place], [place] [address]  Phosphorus     Status: None   Collection Time: 06/27/17  2:29 AM  Result Value Ref Range   Phosphorus 3.6 2.5 - 4.6 mg/dL    Comment: Performed at [facility] [address] [place], [place] [address]  CBC with Differential     Status: Abnormal   Collection Time: 06/27/17  2:38 AM  Result Value Ref Range   WBC 6.0 4.0 - 10.5 K/uL   RBC 3.90 3.87 - 5.11 MIL/uL   Hemoglobin 13.3 12.0 - 15.0 g/dL   HCT 38.6 36.0 - 46.0 %   MCV 99.0 78.0 - 100.0 fL   MCH 34.1 (H) 26.0 - 34.0 pg   MCHC 34.5 30.0 - 36.0 g/dL   RDW 13.3 11.5 - 15.5 %   Platelets 336 150 - 400 K/uL   Neutrophils Relative % 47 %   Neutro Abs 2.9 1.7 - 7.7 K/uL   Lymphocytes Relative 46 %   Lymphs Abs 2.7 0.7 - 4.0 K/uL   Monocytes Relative 6 %   Monocytes Absolute 0.4 0.1 - 1.0 K/uL   Eosinophils Relative 1 %   Eosinophils Absolute 0.0 0.0 - 0.7 K/uL   Basophils Relative 0 %   Basophils Absolute 0.0 0.0 - 0.1 K/uL    Comment: Performed at [facility] [address] [place], [place] [address]  I-Stat beta hCG blood, ED     Status: None   Collection Time: 06/27/17  2:41 AM  Result Value Ref Range   I-stat hCG, quantitative <5.0 <5 mIU/mL   Comment 3            Comment:   GEST. AGE      CONC.  (mIU/mL)   <=1 WEEK        5 - 50     2 WEEKS       50 - 500     3 WEEKS       100 - 10,000     4 WEEKS     1,000 - 30,000        FEMALE AND NON-PREGNANT FEMALE:     LESS THAN 5 mIU/mL   I-Stat arterial blood gas, ED     Status: Abnormal   Collection Time: 06/27/17  5:24 AM  Result Value Ref Range   pH, Arterial 7.252 (L) 7.350 - 7.450   pCO2 arterial 44.0 32.0 - 48.0 mmHg   pO2, Arterial 90.0 83.0 - 108.0 mmHg   Bicarbonate 19.4 (L) 20.0 - 28.0 mmol/L   TCO2 21 (L) 22 - 32 mmol/L   O2 Saturation 95.0 %   Acid-base deficit 8.0 (H) 0.0 - 2.0  mmol/L   Patient temperature HIDE    Sample type ARTERIAL   HIV antibody (Routine Testing)     Status: None   Collection Time: 06/27/17  6:24 AM  Result Value Ref Range   HIV Screen 4th Generation wRfx Non  Reactive Non Reactive    Comment: (NOTE) Performed At: [facility] [place], [place] [id number] Rush Farmer MD OV:[id number] Performed at [facility], [place] [address], [place], [place] [address]   Triglycerides     Status: None   Collection Time: 06/27/17  6:25 AM  Result Value Ref Range   Triglycerides 106 <150 mg/dL    Comment: Performed at [facility] [address] [place], [place] [address]  Magnesium     Status: Abnormal   Collection Time: 06/27/17  6:25 AM  Result Value Ref Range   Magnesium 1.5 (L) 1.7 - 2.4 mg/dL    Comment: Performed at [facility] [address], [place], [place] [address]  I-Stat arterial blood gas, ED     Status: Abnormal   Collection Time: 06/27/17  7:03 AM  Result Value Ref Range   pH, Arterial 7.251 (L) 7.350 - 7.450   pCO2 arterial 52.2 (H) 32.0 - 48.0 mmHg   pO2, Arterial 398.0 (H) 83.0 - 108.0 mmHg   Bicarbonate 22.9 20.0 - 28.0 mmol/L   TCO2 24 22 - 32 mmol/L   O2 Saturation 100.0 %   Acid-base deficit 4.0 (H) 0.0 - 2.0 mmol/L   Patient temperature HIDE    Sample type ARTERIAL   MRSA PCR Screening     Status: None   Collection Time: 06/27/17  9:09 AM  Result Value Ref Range   MRSA by PCR NEGATIVE NEGATIVE    Comment:        The GeneXpert MRSA Assay (FDA approved for NASAL specimens only), is one component of a comprehensive MRSA colonization surveillance program. It is not intended to diagnose MRSA infection nor to guide or monitor treatment for MRSA infections. Performed at [facility], [place] [address], [place], [place] [address]   Basic metabolic panel     Status: Abnormal   Collection Time: 06/27/17  3:42 PM  Result Value Ref Range   Sodium 141 135 - 145 mmol/L    Potassium 3.3 (L) 3.5 - 5.1 mmol/L   Chloride 114 (H) 101 - 111 mmol/L   CO2 19 (L) 22 - 32 mmol/L   Glucose, Bld 80 65 - 99 mg/dL   BUN 6 6 - 20 mg/dL   Creatinine, Ser 0.66 0.44 - 1.00 mg/dL   Calcium 6.9 (L) 8.9 - 10.3 mg/dL   GFR calc non Af Amer >60 >60 mL/min   GFR calc Af Amer >60 >60 mL/min    Comment: (NOTE) The eGFR has been calculated using the CKD EPI equation. This calculation has not been validated in all clinical situations. eGFR's persistently <60 mL/min signify possible Chronic Kidney Disease.    Anion gap 8 5 - 15    Comment: Performed at [facility] [address] [place], [place] [address]  Blood gas, arterial     Status: Abnormal   Collection Time: 06/28/17  4:55 AM  Result Value Ref Range   FIO2 21.00    pH, Arterial 7.389 7.350 - 7.450   pCO2 arterial 37.9 32.0 - 48.0 mmHg   pO2, Arterial 80.1 (L) 83.0 - 108.0 mmHg   Bicarbonate 22.4 20.0 - 28.0 mmol/L   Acid-base deficit 1.8 0.0 - 2.0 mmol/L   O2 Saturation 96.4 %   Patient temperature 98.4    Collection site LEFT RADIAL    Sample type ARTERIAL    Allens test (pass/fail) PASS PASS  CBC  Status: Abnormal   Collection Time: 06/28/17  5:40 AM  Result Value Ref Range   WBC 7.4 4.0 - 10.5 K/uL   RBC 3.24 (L) 3.87 - 5.11 MIL/uL   Hemoglobin 10.6 (L) 12.0 - 15.0 g/dL    Comment: REPEATED TO VERIFY DELTA CHECK NOTED    HCT 33.0 (L) 36.0 - 46.0 %   MCV 101.9 (H) 78.0 - 100.0 fL   MCH 32.7 26.0 - 34.0 pg   MCHC 32.1 30.0 - 36.0 g/dL   RDW 13.5 11.5 - 15.5 %   Platelets 259 150 - 400 K/uL    Comment: Performed at [facility] [address], [place], [place] [address]  Basic metabolic panel     Status: Abnormal   Collection Time: 06/28/17  5:40 AM  Result Value Ref Range   Sodium 137 135 - 145 mmol/L   Potassium 3.3 (L) 3.5 - 5.1 mmol/L   Chloride 110 101 - 111 mmol/L   CO2 23 22 - 32 mmol/L   Glucose, Bld 91 65 - 99 mg/dL   BUN <5 (L) 6 - 20 mg/dL   Creatinine, Ser 0.59 0.44 -  1.00 mg/dL   Calcium 7.7 (L) 8.9 - 10.3 mg/dL   GFR calc non Af Amer >60 >60 mL/min   GFR calc Af Amer >60 >60 mL/min    Comment: (NOTE) The eGFR has been calculated using the CKD EPI equation. This calculation has not been validated in all clinical situations. eGFR's persistently <60 mL/min signify possible Chronic Kidney Disease.    Anion gap 4 (L) 5 - 15    Comment: Performed at [facility] [address], [place], [place] [address]  Magnesium     Status: Abnormal   Collection Time: 06/28/17  5:40 AM  Result Value Ref Range   Magnesium 1.5 (L) 1.7 - 2.4 mg/dL    Comment: Performed at [facility] [address] [place], [place] [address]  Phosphorus     Status: None   Collection Time: 06/28/17  5:40 AM  Result Value Ref Range   Phosphorus 2.5 2.5 - 4.6 mg/dL    Comment: Performed at [facility] [address] [place], [place] [address]    Current Facility-Administered Medications  Medication Dose Route Frequency Provider Last Rate Last Dose  . 0.9 %  sodium chloride infusion  250 mL Intravenous PRN Hammonds, Sharyn Blitz, MD      . acetaminophen (TYLENOL) tablet 650 mg  650 mg Oral Q4H PRN Hammonds, Sharyn Blitz, MD      . docusate (COLACE) 50 MG/5ML liquid 100 mg  100 mg Per Tube BID Hammonds, Sharyn Blitz, MD   100 mg at 06/27/17 0953  . fentaNYL (SUBLIMAZE) bolus via infusion 50 mcg  50 mcg Intravenous Q1H PRN Hammonds, Sharyn Blitz, MD      . fentaNYL 2565mg in NS 2523m(1050mml) infusion-PREMIX  25-400 mcg/hr Intravenous Continuous Hammonds, KatSharyn BlitzD   Stopped at 06/27/17 1518  . heparin injection 5,000 Units  5,000 Units Subcutaneous Q8H Hammonds, KatSharyn BlitzD   5,000 Units at 06/28/17 0531  . MEDLINE mouth rinse  15 mL Mouth Rinse QID Hammonds, KatSharyn BlitzD   15 mL at 06/27/17 1546  . midazolam (VERSED) injection 2 mg  2 mg Intravenous Q15 min PRN Hammonds, KatSharyn BlitzD      . midazolam (VERSED) injection 2 mg  2 mg Intravenous Q1H PRN  Hammonds, KatSharyn BlitzD      .  nicotine (NICODERM CQ - dosed in mg/24 hours) patch 21 mg  21 mg Transdermal Daily Flora Lipps, MD   21 mg at 06/27/17 2116  . pantoprazole (PROTONIX) injection 40 mg  40 mg Intravenous QHS Hammonds, Sharyn Blitz, MD   40 mg at 06/27/17 2115  . propofol (DIPRIVAN) 1000 MG/100ML infusion  5-80 mcg/kg/min Intravenous Titrated Hammonds, Sharyn Blitz, MD   Stopped at 06/27/17 1518    Musculoskeletal: Strength & Muscle Tone: within normal limits Gait & Station: UTA since patient was lying in bed. Patient leans: N/A  Psychiatric Specialty Exam: Physical Exam  Nursing note and vitals reviewed. Constitutional: She is oriented to person, place, and time. She appears well-developed and well-nourished.  HENT:  Head: Normocephalic and atraumatic.  Neck: Normal range of motion.  Respiratory: Effort normal.  Musculoskeletal: Normal range of motion.  Neurological: She is alert and oriented to person, place, and time.  Skin: No rash noted.  Psychiatric: She has a normal mood and affect. Her speech is normal and behavior is normal. Thought content normal. Cognition and memory are normal. She expresses impulsivity.    Review of Systems  Cardiovascular: Negative for chest pain.  Gastrointestinal: Negative for abdominal pain, constipation, diarrhea, nausea and vomiting.  Psychiatric/Behavioral: Positive for substance abuse. Negative for depression, hallucinations and suicidal ideas. The patient is not nervous/anxious and does not have insomnia.   All other systems reviewed and are negative.   Blood pressure (!) 88/54, pulse 72, temperature 98.8 F (37.1 C), temperature source Oral, resp. rate 18, height _0  (1.575 m), weight 91.2 kg (201 lb 1 oz), SpO2 93 %.Body mass index is 36.77 kg/m.  General Appearance: Fairly Groomed, young, Caucasian female, wearing a hospital gown and lying in bed. NAD.   Eye Contact:  Good  Speech:  Clear and Coherent and Normal Rate  Volume:   Normal  Mood:  "I've been stressed."   Affect:  Appropriate and Full Range  Thought Process:  Goal Directed, Linear and Descriptions of Associations: Intact  Orientation:  Full (Time, Place, and Person)  Thought Content:  Logical  Suicidal Thoughts:  No  Homicidal Thoughts:  No  Memory:  Immediate;   Good Recent;   Good Remote;   Good  Judgement:  Fair  Insight:  Fair  Psychomotor Activity:  Normal  Concentration:  Concentration: Good and Attention Span: Good  Recall:  Good  Fund of Knowledge:  Good  Language:  Good  Akathisia:  No  Handed:  Right  AIMS (if indicated):   N/A  Assets:  Communication Skills Desire for Improvement Housing Social Support  ADL's:  Intact  Cognition:  WNL  Sleep:   Poor   Assessment:  Azure Budnick is a 37 y.o. female who was admitted with polydrug overdose in the setting of alcohol intoxication (BAL129 on admission). She reports a history of impulsive behaviors associated with alcohol use. She reports significantly cutting down on use in the past few months and is currently participating in mental health drug court. She reports overdosing in the setting of alcohol intoxication due to impaired judgment. She denies SI and any intention to harm self. She is future oriented and demonstrates good judgment about drinking at this time. She knows alcohol use has been a problem in the recent past and she should have not drank again. She is able to safety plan. Her girlfriend does not believe that she is a risk of harm to self when she is sober. She does not have  alcohol at her home and this is where patient will stay until she has follow up with her outpatient provider. She agrees to monitor patient for safety and encourage her to follow up with her appointment.  Treatment Plan Summary: -Continue to hold psychotropic medications until patient is medically stable to restart them.  -It will be important for patient to continue to be actively involved in  mental health drug court and other groups such as AA to remain sober given high risk of impulsive behaviors in setting of alcohol use.  -Patient is psychiatrically cleared. She is able to safety plan and her girlfriend agrees to monitor patient for safety and encourage patient to follow up with her outpatient provider.  --Psychiatry will sign off on patient at this time. Please consult psychiatry again as needed.   Disposition: No evidence of imminent risk to self or others at present.    Faythe Dingwall, DO 06/28/2017 11:22 AM

## 2017-06-28 NOTE — Progress Notes (Signed)
Patient is being discharged home. Discharge instructions given to patient and patient verbalized understanding. Patient wheeled of the unit by staff.

## 2017-06-28 NOTE — Discharge Summary (Signed)
Physician Discharge Summary  Patient ID: [name] MRN: [id number] DOB/AGE: Jul 31, 1980 37 y.o.  Admit date: 06/27/2017 Discharge date: 06/28/2017    Discharge Diagnoses:  Bipolar Disorder. Polysubstance abuse - s/p overdose. EtOH dependence.                                                                     DISCHARGE PLAN BY DIAGNOSIS    Polysubstance abuse - s/p overdose.  Seen by Psychiatry 3/21 and cleared. Bipolar Disorder. EtOH dependence. Plan: Continue preadmission buspirone, gabapentin, prazosin, sertraline. Pt counseled on overdose / medication abuse. Pt counseled on being activity involved in mental health drug court and other groups such as AA to remain sober and avoiding impulsive behaviors. Follow up as outpatient.                DISCHARGE SUMMARY   [name] is a 37 y.o. y/o female with a PMH of bipolar disorder, ETOH abuse, and poly substance abuse. She presented to [facility] ED in the early AM hours of 3/20 after polypharmacy ingestion.  Apparently she had been having trouble sleeping or relaxing and began drinking heavily but when this brought no relaxation she impulsively took a number of medicines including Benadryl Zoloft and Minipress.  She arrived in the emergency room hypotensive and unresponsive and required transient intubation for airway protection.  Blood pressure responded to fluid administration.  She initially had a prolonged QTC but this corrected and she was alert later in the day and extubated.  She has been seen by the psychiatric service who feel that she does not represent a danger to herself or others and she is going to be discharged home on her usual medications with instructions to be actively involved in the health drug court.  SIGNIFICANT EVENTS 3/20 > admit. 3/21 > cleared by psych, discharged.  CONSULTS Psychiatry.  TUBES / LINES ETT 3/20 > 3/21.   Discharge Exam: General: Today she is calmly interactive.   She is sitting up in the bed in no acute distress. Neuro: Oriented x3 and appropriate for me using all fours pupils are equal Cardiovascular: S1 and S2 are regular without murmur rub or gallop Lungs: Respirations are unlabored, there are a few scattered rhonchi, no wheezes, there is good air movement throughout Abdomen: Abdomen is soft without any organomegaly masses tenderness guarding or rebound Musculoskeletal: Do not appreciate any tenderness or deformity of the left ankle she appears to have a normal range of motion there is no effusion or warmth   Vitals:   06/28/17 1700 06/28/17 1800 06/28/17 1900 06/28/17 2000  BP: 111/62 (!) 127/102 131/88 120/69  Pulse: 73 73 80 70  Resp: (!) 23 (!) 23 17 (!) 23  Temp:      TempSrc:      SpO2: 98% 98% 98% 97%  Weight:      Height:         Discharge Labs  BMET Recent Labs  Lab 06/27/17 0229 06/27/17 0625 06/27/17 1542 06/28/17 0540  NA 138  --  141 137  K 2.9*  --  3.3* 3.3*  CL 105  --  114* 110  CO2 22  --  19* 23  GLUCOSE 117*  --  80 91  BUN <5*  --  6 <5*  CREATININE 0.69  --  0.66 0.59  CALCIUM 8.8*  --  6.9* 7.7*  MG 1.9 1.5*  --  1.5*  PHOS 3.6  --   --  2.5    CBC Recent Labs  Lab 06/27/17 0238 06/28/17 0540  HGB 13.3 10.6*  HCT 38.6 33.0*  WBC 6.0 7.4  PLT 336 259    Anti-Coagulation No results for input(s): INR in the last 168 hours.  Discharge Instructions    Diet - low sodium heart healthy   Complete by:  As directed    Increase activity slowly   Complete by:  As directed           Allergies as of 06/28/2017      Reactions   Haldol [haloperidol Lactate] Other (See Comments)   Makes whole body stiff      Medication List    TAKE these medications   busPIRone 10 MG tablet Commonly known as:  BUSPAR Take 10 mg by mouth 3 (three) times daily.   folic acid 1 MG tablet Commonly known as:  FOLVITE Take 1 tablet (1 mg total) by mouth daily.   gabapentin 300 MG capsule Commonly known as:   NEURONTIN Take 1 capsule (300 mg total) by mouth 2 (two) times daily. For numbness What changed:    when to take this  additional instructions   multivitamin with minerals Tabs tablet Take 1 tablet by mouth daily. Start taking on:  06/29/2017   prazosin 2 MG capsule Commonly known as:  MINIPRESS Take 2 capsules (4 mg total) by mouth at bedtime.   sertraline 25 MG tablet Commonly known as:  ZOLOFT Take 37.5 mg by mouth daily.   thiamine 100 MG tablet Take 1 tablet (100 mg total) by mouth daily. Start taking on:  06/29/2017        Disposition: Home.  Discharged Condition: [name] has met maximum benefit of inpatient care and is medically stable and cleared for discharge.  Patient is pending follow up as above.      Time spent on disposition:  Greater than 35 minutes.   Montey Hora, [facility] P[facility]: [phone]  or [phone]

## 2017-06-28 NOTE — Discharge Summary (Signed)
Date of admission 3-20- 2018 Date of discharge 06-28-2017  Principal diagnoses  Polypharmacy overdose Altered mental status requiring intubation and mechanical ventilation Bipolar disorder Drug abuse Alcohol abuse Impulsive behavior disorder  This is a 37 year old with bipolar disease who was admitted after polypharmacy ingestion.  Apparently she had been having trouble sleeping or relaxing and began drinking heavily but when this brought no relaxation she impulsively took a number of medicines including Benadryl Zoloft and Minipress.  She arrived in the emergency room hypotensive and unresponsive and required transient intubation for airway protection.  Blood pressure responded to fluid administration.  She initially had a prolonged QTC but this corrected and she was alert later in the day and extubated.  She has been seen by the psychiatric service who feel that she does not represent a danger to herself or others and she is going to be discharged home on her usual medications with instructions to be actively involved in the health drug court.

## 2017-12-24 ENCOUNTER — Emergency Department (HOSPITAL_BASED_OUTPATIENT_CLINIC_OR_DEPARTMENT_OTHER): Payer: Self-pay

## 2017-12-24 ENCOUNTER — Encounter (HOSPITAL_BASED_OUTPATIENT_CLINIC_OR_DEPARTMENT_OTHER): Payer: Self-pay

## 2017-12-24 ENCOUNTER — Other Ambulatory Visit: Payer: Self-pay

## 2017-12-24 ENCOUNTER — Emergency Department (HOSPITAL_BASED_OUTPATIENT_CLINIC_OR_DEPARTMENT_OTHER)
Admission: EM | Admit: 2017-12-24 | Discharge: 2017-12-24 | Disposition: A | Discharge status: Home / Self Care | Payer: Self-pay | Attending: Emergency Medicine | Admitting: Emergency Medicine

## 2017-12-24 DIAGNOSIS — L03116 Cellulitis of left lower limb: Secondary | ICD-10-CM | POA: Insufficient documentation

## 2017-12-24 DIAGNOSIS — Z79899 Other long term (current) drug therapy: Secondary | ICD-10-CM | POA: Insufficient documentation

## 2017-12-24 DIAGNOSIS — F17210 Nicotine dependence, cigarettes, uncomplicated: Secondary | ICD-10-CM | POA: Insufficient documentation

## 2017-12-24 LAB — CBC WITH DIFFERENTIAL/PLATELET
Basophils Absolute: 0 10*3/uL (ref 0.0–0.1)
Basophils Relative: 0 %
EOS PCT: 1 %
Eosinophils Absolute: 0.1 10*3/uL (ref 0.0–0.7)
HEMATOCRIT: 36 % (ref 36.0–46.0)
Hemoglobin: 12.4 g/dL (ref 12.0–15.0)
LYMPHS ABS: 2.5 10*3/uL (ref 0.7–4.0)
LYMPHS PCT: 31 %
MCH: 33.8 pg (ref 26.0–34.0)
MCHC: 34.4 g/dL (ref 30.0–36.0)
MCV: 98.1 fL (ref 78.0–100.0)
MONO ABS: 0.5 10*3/uL (ref 0.1–1.0)
Monocytes Relative: 6 %
Neutro Abs: 5 10*3/uL (ref 1.7–7.7)
Neutrophils Relative %: 62 %
PLATELETS: 376 10*3/uL (ref 150–400)
RBC: 3.67 MIL/uL — AB (ref 3.87–5.11)
RDW: 12.2 % (ref 11.5–15.5)
WBC: 8 10*3/uL (ref 4.0–10.5)

## 2017-12-24 LAB — BASIC METABOLIC PANEL
Anion gap: 9 (ref 5–15)
BUN: 10 mg/dL (ref 6–20)
CHLORIDE: 106 mmol/L (ref 98–111)
CO2: 24 mmol/L (ref 22–32)
Calcium: 8.8 mg/dL — ABNORMAL LOW (ref 8.9–10.3)
Creatinine, Ser: 0.61 mg/dL (ref 0.44–1.00)
GFR calc Af Amer: >60 mL/min (ref >60)
GFR calc non Af Amer: >60 mL/min (ref >60)
GLUCOSE: 102 mg/dL — AB (ref 70–99)
POTASSIUM: 3.8 mmol/L (ref 3.5–5.1)
Sodium: 139 mmol/L (ref 135–145)

## 2017-12-24 MED ORDER — DOXYCYCLINE HYCLATE 100 MG PO CAPS: 100.0000 mg | ORAL_CAPSULE | Freq: Two times a day (BID) | ORAL | 0 refills | Status: DC

## 2017-12-24 MED ORDER — HYDROCODONE-ACETAMINOPHEN 5-325 MG PO TABS: 1.0000 | ORAL_TABLET | Freq: Four times a day (QID) | ORAL | 0 refills | Status: DC | PRN

## 2017-12-24 MED FILL — DOXYCYCLINE HYCLATE 100 MG: 100 | 7 days supply | Qty: 14 | Fill #0

## 2017-12-24 MED FILL — HYDROCODON-APAP 5-325: 5-325 | 1 days supply | Qty: 6 | Fill #0

## 2017-12-24 NOTE — ED Provider Notes (Signed)
[facility] EMERGENCY DEPARTMENT Provider Note   CSN: [id number] Arrival date & time: 12/24/17  1040     History   Chief Complaint Chief Complaint  Patient presents with  . Wound Check    HPI [name] is a 37 y.o. female.  HPI  37 year old female presents with left foot pain and concern for infection.  She states it started off as being bitten by fleas 3 or 4 days ago.  They were so itchy that she was scratching herself and caused bleeding and break in the skin.  Over the last 2 days it has been painful and then today it has been swollen and red and warm.  Pain is moderate, 5/10.  Tried ibuprofen and Tylenol with no relief.  No fevers.  Past Medical History:  Diagnosis Date  . Anxiety   . Benzodiazepine abuse (HCC)   . Bipolar 1 disorder (HCC)   . Depression   . Drug abuse (HCC)   . ETOH abuse   . Polysubstance abuse [facility])     Patient Active Problem List   Diagnosis Date Noted  . Drug overdose 06/27/2017  . Intermittent explosive disorder 11/22/2016  . Chronic midline low back pain 05/29/2016  . Substance induced mood disorder (HCC) 06/28/2015  . 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 Polysubstance dependence (HCC) 06/06/2015  . Polysubstance dependence including opioid type drug, episodic abuse (HCC) 06/03/2015  . Polysubstance abuse (HCC) 11/01/2013  . Benzodiazepine dependence (HCC) 11/01/2013  . Bipolar I disorder, most recent episode (or current) manic (HCC) 02/25/2013  . Substance abuse/dependence 02/21/2013    History reviewed. No pertinent surgical history.   OB History   None      Home Medications    Prior to Admission medications   Medication Sig Start Date End Date Taking? Authorizing Provider  busPIRone (BUSPAR) 10 MG tablet Take 10 mg by mouth 3 (three) times daily.    [provider]  doxycycline (VIBRAMYCIN) 100 MG capsule Take 1 capsule (100 mg total) by mouth 2 (two) times daily. One  po bid x 7 days 12/24/17   Pricilla Loveless, MD  folic acid (FOLVITE) 1 MG tablet Take 1 tablet (1 mg total) by mouth daily. 06/28/17 06/28/18  Desai, Rahul P, PA-C  gabapentin (NEURONTIN) 300 MG capsule Take 1 capsule (300 mg total) by mouth 2 (two) times daily. For numbness Patient taking differently: Take 300 mg by mouth 3 (three) times daily. For numbness 06/05/16   Arrie Senate R, FNP  HYDROcodone-acetaminophen (NORCO) 5-325 MG tablet Take 1 tablet by mouth every 6 (six) hours as needed for severe pain. 12/24/17   Pricilla Loveless, MD  Multiple Vitamin (MULTIVITAMIN WITH MINERALS) TABS tablet Take 1 tablet by mouth daily. 06/29/17   Desai, Rahul P, PA-C  prazosin (MINIPRESS) 2 MG capsule Take 2 capsules (4 mg total) by mouth at bedtime. 07/21/16   Adonis Brook, NP  sertraline (ZOLOFT) 25 MG tablet Take 37.5 mg by mouth daily.    [provider]  thiamine 100 MG tablet Take 1 tablet (100 mg total) by mouth daily. 06/29/17   Kathlene Cote, PA-C    Family History No family history on file.  Social History Social History   Tobacco Use  . Smoking status: Current Every Day Smoker    Packs/day: 2.00    Years: 17.00    Pack years: 34.00    Types: Cigarettes  . Smokeless tobacco: Never Used  Substance Use Topics  .  Alcohol use: Yes    Comment: +63  . Drug use: Yes    Types: Marijuana, IV    Comment: UDS + THC     Allergies   Haldol [haloperidol lactate]   Review of Systems Review of Systems  Constitutional: Negative for fever.  Musculoskeletal: Positive for arthralgias and joint swelling.  Skin: Positive for color change and wound.  All other systems reviewed and are negative.    Physical Exam Updated Vital Signs BP 116/74 (BP Location: Right Arm)   Pulse 81   Temp 97.9 F (36.6 C) (Oral)   Resp 18   Ht 5\' 2"  (1.575 m)   Wt 97.5 kg   LMP 12/24/2017   SpO2 100%   BMI 39.32 kg/m   Physical Exam  Constitutional: She is oriented to person, place, and  time. She appears well-developed and well-nourished.  HENT:  Head: Normocephalic and atraumatic.  Right Ear: External ear normal.  Left Ear: External ear normal.  Nose: Nose normal.  Eyes: Right eye exhibits no discharge. Left eye exhibits no discharge.  Cardiovascular: Normal rate and regular rhythm.  Pulses:      Dorsalis pedis pulses are 2+ on the left side.  Pulmonary/Chest: Effort normal.  Musculoskeletal:       Left foot: There is tenderness and swelling. There is no crepitus.       Feet:  Neurological: She is alert and oriented to person, place, and time.  Skin: Skin is warm and dry.  Nursing note and vitals reviewed.      ED Treatments / Results  Labs (all labs ordered are listed, but only abnormal results are displayed) Labs Reviewed  BASIC METABOLIC PANEL - Abnormal; Notable for the following components:      Result Value   Glucose, Bld 102 (*)    Calcium 8.8 (*)    All other components within normal limits  CBC WITH DIFFERENTIAL/PLATELET - Abnormal; Notable for the following components:   RBC 3.67 (*)    All other components within normal limits    EKG None  Radiology Dg Foot Complete Left  Result Date: 12/24/2017 CLINICAL DATA:  Bug bites, lateral foot cellulitis. EXAM: LEFT FOOT - COMPLETE 3+ VIEW COMPARISON:  Ankle series 06/28/2017 FINDINGS: Small plantar and posterior calcaneal spurs. Mild soft tissue swelling over the metatarsals along the dorsum of the foot. No fracture, subluxation or dislocation. No radiopaque foreign bodies. No radiographic changes of osteomyelitis. IMPRESSION: No acute bony abnormality. Electronically Signed   By: Charlett NoseKevin  Dover M.D.   On: 12/24/2017 11:48    Procedures Procedures (including critical care time)  EMERGENCY DEPARTMENT US SOFT TISSUE INTERPRETATION "Study: Limited Soft Tissue Ultrasound"  INDICATIONS: Pain and Soft tissue infection Multiple views of the body part were obtained in real-time with a multi-frequency  linear probe  PERFORMED BY: Myself IMAGES ARCHIVED?: Yes SIDE:Left BODY PART:Lower extremity INTERPRETATION:  No abcess noted and Cellulitis present   Medications Ordered in ED Medications - No data to display   Initial Impression / Assessment and Plan / ED Course  I have reviewed the triage vital signs and the nursing notes.  Pertinent labs & imaging results that were available during my care of the patient were reviewed by me and considered in my medical decision making (see chart for details).     Patient appears to have cellulitis of her left foot.  Ultrasound does not show any obvious localized/loculated fluid collection.  There is some generalized edema consistent with the swelling/cellulitis.  X-ray  also unremarkable.  My suspicion for necrotizing fasciitis is pretty low, especially given pain is moderate and has no other signs or symptoms of systemic illness.  Labs unremarkable as well.  I did discuss strict return precautions but I think is reasonable to treat with outpatient antibiotics.  Given her significant pain when walking I will give her a short course of Norco as well.  Return precautions.  Final Clinical Impressions(s) / ED Diagnoses   Final diagnoses:  Cellulitis of left foot    ED Discharge Orders         Ordered    HYDROcodone-acetaminophen (NORCO) 5-325 MG tablet  Every 6 hours PRN     12/24/17 1226    doxycycline (VIBRAMYCIN) 100 MG capsule  2 times daily     12/24/17 1226           Pricilla Loveless, MD 12/24/17 1231

## 2017-12-24 NOTE — ED Notes (Signed)
Patient transported to X-ray 

## 2017-12-24 NOTE — Discharge Instructions (Signed)
If you develop severe pain, fever, vomiting, or worsening redness, or any other new/concerning symptoms and return to the ER for evaluation.  Otherwise follow-up with your primary care physician.  Take the antibiotics until completion, even if you are feeling better.

## 2017-12-24 NOTE — ED Triage Notes (Signed)
Pt reports she had a bite on her left foot and she scratched it and now it is infected. Pt's left foot is swollen and red.

## 2018-06-30 ENCOUNTER — Encounter (HOSPITAL_COMMUNITY): Payer: Self-pay

## 2018-06-30 ENCOUNTER — Emergency Department (HOSPITAL_COMMUNITY)
Admission: EM | Admit: 2018-06-30 | Discharge: 2018-07-01 | Disposition: A | Discharge status: Home / Self Care | Payer: Self-pay | Attending: Emergency Medicine | Admitting: Emergency Medicine

## 2018-06-30 ENCOUNTER — Other Ambulatory Visit: Payer: Self-pay

## 2018-06-30 DIAGNOSIS — F10920 Alcohol use, unspecified with intoxication, uncomplicated: Secondary | ICD-10-CM | POA: Insufficient documentation

## 2018-06-30 DIAGNOSIS — Z79899 Other long term (current) drug therapy: Secondary | ICD-10-CM | POA: Insufficient documentation

## 2018-06-30 DIAGNOSIS — Y905 Blood alcohol level of 100-119 mg/100 ml: Secondary | ICD-10-CM | POA: Insufficient documentation

## 2018-06-30 DIAGNOSIS — R45851 Suicidal ideations: Secondary | ICD-10-CM | POA: Insufficient documentation

## 2018-06-30 DIAGNOSIS — F17210 Nicotine dependence, cigarettes, uncomplicated: Secondary | ICD-10-CM | POA: Insufficient documentation

## 2018-06-30 DIAGNOSIS — F329 Major depressive disorder, single episode, unspecified: Secondary | ICD-10-CM | POA: Insufficient documentation

## 2018-06-30 DIAGNOSIS — F1290 Cannabis use, unspecified, uncomplicated: Secondary | ICD-10-CM | POA: Insufficient documentation

## 2018-06-30 NOTE — ED Triage Notes (Signed)
Pt brought in by [employer] requiring a psych eval. Pt's friend contacted [employer] stating that she is SI. Pt had texted for stating that she wants to hurt herself. Pt reports to be under stress. Reports that she made threats to hurt herself but states to med professionals that she has no desire to hurt herself. Pt denies current complaints of SI/HI. Pt admits to recent ETOH and marijuana use.

## 2018-06-30 NOTE — ED Provider Notes (Signed)
[facility] EMERGENCY DEPARTMENT Provider Note   CSN: [id number] Arrival date & time: 06/30/18  2326    History   Chief Complaint Chief Complaint  Patient presents with  . Psychiatric Evaluation    HPI [name] is a 38 y.o. female.     Patient is a 38 year old female with past medical history of bipolar, depression, anxiety, and substance abuse.  She presents today for evaluation of suicidal threats.  She has apparently texted suicidal messages to her fianc and friend.  1 of these individuals has notified the authorities who bring the patient in for psychiatric evaluation.  She states that she has "a lot going on" and describes a lot of stress in her life, but denies to me that she would act on these thoughts.  She does not feel as though she needs to be here.  She has no other complaints.  She does admit to drinking alcohol this evening and occasional marijuana use.  The history is provided by the patient.    Past Medical History:  Diagnosis Date  . Anxiety   . Benzodiazepine abuse (HCC)   . Bipolar 1 disorder (HCC)   . Depression   . Drug abuse (HCC)   . ETOH abuse   . Polysubstance abuse [facility])     Patient Active Problem List   Diagnosis Date Noted  . Drug overdose 06/27/2017  . Intermittent explosive disorder 11/22/2016  . Chronic midline low back pain 05/29/2016  . Substance induced mood disorder (HCC) 06/28/2015  . 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 Polysubstance dependence (HCC) 06/06/2015  . Polysubstance dependence including opioid type drug, episodic abuse (HCC) 06/03/2015  . Polysubstance abuse (HCC) 11/01/2013  . Benzodiazepine dependence (HCC) 11/01/2013  . Bipolar I disorder, most recent episode (or current) manic (HCC) 02/25/2013  . Substance abuse/dependence 02/21/2013    No past surgical history on file.   OB History   No obstetric history on file.      Home Medications    Prior to  Admission medications   Medication Sig Start Date End Date Taking? Authorizing Provider  busPIRone (BUSPAR) 10 MG tablet Take 10 mg by mouth 3 (three) times daily.    [provider]  doxycycline (VIBRAMYCIN) 100 MG capsule Take 1 capsule (100 mg total) by mouth 2 (two) times daily. One po bid x 7 days 12/24/17   Pricilla Loveless, MD  gabapentin (NEURONTIN) 300 MG capsule Take 1 capsule (300 mg total) by mouth 2 (two) times daily. For numbness Patient taking differently: Take 300 mg by mouth 3 (three) times daily. For numbness 06/05/16   Arrie Senate R, FNP  HYDROcodone-acetaminophen (NORCO) 5-325 MG tablet Take 1 tablet by mouth every 6 (six) hours as needed for severe pain. 12/24/17   Pricilla Loveless, MD  Multiple Vitamin (MULTIVITAMIN WITH MINERALS) TABS tablet Take 1 tablet by mouth daily. 06/29/17   Desai, Rahul P, PA-C  prazosin (MINIPRESS) 2 MG capsule Take 2 capsules (4 mg total) by mouth at bedtime. 07/21/16   Adonis Brook, NP  sertraline (ZOLOFT) 25 MG tablet Take 37.5 mg by mouth daily.    [provider]  thiamine 100 MG tablet Take 1 tablet (100 mg total) by mouth daily. 06/29/17   Kathlene Cote, PA-C    Family History No family history on file.  Social History Social History   Tobacco Use  . Smoking status: Current Every Day Smoker    Packs/day: 2.00  Years: 17.00    Pack years: 34.00    Types: Cigarettes  . Smokeless tobacco: Never Used  Substance Use Topics  . Alcohol use: Yes    Comment: +63  . Drug use: Yes    Types: Marijuana, IV    Comment: UDS + THC     Allergies   Haldol [haloperidol lactate]   Review of Systems Review of Systems  All other systems reviewed and are negative.    Physical Exam Updated Vital Signs BP 130/90 (BP Location: Right Arm)   Pulse 85   Temp 97.9 F (36.6 C) (Oral)   Resp 17   Ht 5\' 2"  (1.575 m)   Wt 99.8 kg   SpO2 100%   BMI 40.24 kg/m   Physical Exam Vitals signs and nursing note reviewed.   Constitutional:      General: She is not in acute distress.    Appearance: She is well-developed. She is not diaphoretic.  HENT:     Head: Normocephalic and atraumatic.  Neck:     Musculoskeletal: Normal range of motion and neck supple.  Cardiovascular:     Rate and Rhythm: Normal rate and regular rhythm.     Heart sounds: No murmur. No friction rub. No gallop.   Pulmonary:     Effort: Pulmonary effort is normal. No respiratory distress.     Breath sounds: Normal breath sounds. No wheezing.  Abdominal:     General: Bowel sounds are normal. There is no distension.     Palpations: Abdomen is soft.     Tenderness: There is no abdominal tenderness.  Musculoskeletal: Normal range of motion.  Skin:    General: Skin is warm and dry.  Neurological:     Mental Status: She is alert and oriented to person, place, and time.  Psychiatric:        Attention and Perception: Attention and perception normal.        Mood and Affect: Mood and affect normal.        Speech: Speech normal.        Behavior: Behavior normal.        Thought Content: Thought content includes suicidal ideation. Thought content does not include homicidal ideation. Thought content does not include suicidal plan.        Cognition and Memory: Cognition normal.        Judgment: Judgment is impulsive.      ED Treatments / Results  Labs (all labs ordered are listed, but only abnormal results are displayed) Labs Reviewed - No data to display  EKG None  Radiology No results found.  Procedures Procedures (including critical care time)  Medications Ordered in ED Medications - No data to display   Initial Impression / Assessment and Plan / ED Course  I have reviewed the triage vital signs and the nursing notes.  Pertinent labs & imaging results that were available during my care of the patient were reviewed by me and considered in my medical decision making (see chart for details).  Patient brought here by law  enforcement for evaluation of suicidal threats.  She allegedly sent text messages to friends stating she was thinking about harming herself.  Patient adamantly denies that she meant what she texted and tells me that she was consuming alcohol when these texts were sent.  Patient was offered consultation with TTS, however has decided that this is not in her best interest.  She is requesting to be discharged.  The patient is here voluntarily  and no IVC has been taken out.  At this point, I see no reason to keep her against her will.  Patient will be discharged at her request.  She understands she is welcome to return at any time if she decides she needs additional help.  Final Clinical Impressions(s) / ED Diagnoses   Final diagnoses:  None    ED Discharge Orders    None       Geoffery Lyons, MD 07/01/18 [phone]

## 2018-07-01 LAB — RAPID URINE DRUG SCREEN, HOSP PERFORMED
Amphetamines: NOT DETECTED
BARBITURATES: NOT DETECTED
Benzodiazepines: POSITIVE — AB
Cocaine: NOT DETECTED
Opiates: NOT DETECTED
Tetrahydrocannabinol: POSITIVE — AB

## 2018-07-01 LAB — BASIC METABOLIC PANEL
Anion gap: 5 (ref 5–15)
BUN: 6 mg/dL (ref 6–20)
CALCIUM: 8.4 mg/dL — AB (ref 8.9–10.3)
CO2: 22 mmol/L (ref 22–32)
CREATININE: 0.62 mg/dL (ref 0.44–1.00)
Chloride: 101 mmol/L (ref 98–111)
Glucose, Bld: 98 mg/dL (ref 70–99)
Potassium: 3.4 mmol/L — ABNORMAL LOW (ref 3.5–5.1)
SODIUM: 128 mmol/L — AB (ref 135–145)

## 2018-07-01 LAB — CBC WITH DIFFERENTIAL/PLATELET
Abs Immature Granulocytes: 0.02 10*3/uL (ref 0.00–0.07)
BASOS ABS: 0.1 10*3/uL (ref 0.0–0.1)
Basophils Relative: 1 %
EOS ABS: 0.1 10*3/uL (ref 0.0–0.5)
EOS PCT: 1 %
HEMATOCRIT: 37.3 % (ref 36.0–46.0)
HEMOGLOBIN: 12 g/dL (ref 12.0–15.0)
Immature Granulocytes: 0 %
LYMPHS ABS: 2.9 10*3/uL (ref 0.7–4.0)
LYMPHS PCT: 44 %
MCH: 31.3 pg (ref 26.0–34.0)
MCHC: 32.2 g/dL (ref 30.0–36.0)
MCV: 97.4 fL (ref 80.0–100.0)
Monocytes Absolute: 0.5 10*3/uL (ref 0.1–1.0)
Monocytes Relative: 7 %
NRBC: 0 % (ref 0.0–0.2)
Neutro Abs: 3.1 10*3/uL (ref 1.7–7.7)
Neutrophils Relative %: 47 %
Platelets: 342 10*3/uL (ref 150–400)
RBC: 3.83 MIL/uL — AB (ref 3.87–5.11)
RDW: 12.3 % (ref 11.5–15.5)
WBC: 6.6 10*3/uL (ref 4.0–10.5)

## 2018-07-01 LAB — URINALYSIS, ROUTINE W REFLEX MICROSCOPIC
BILIRUBIN URINE: NEGATIVE
GLUCOSE, UA: NEGATIVE mg/dL
KETONES UR: NEGATIVE mg/dL
LEUKOCYTE UA: NEGATIVE
NITRITE: NEGATIVE
PROTEIN: NEGATIVE mg/dL
Specific Gravity, Urine: 1.003 — ABNORMAL LOW (ref 1.005–1.030)
pH: 5 (ref 5.0–8.0)

## 2018-07-01 LAB — ETHANOL: Alcohol, Ethyl (B): 117 mg/dL — ABNORMAL HIGH (ref <10)

## 2018-07-01 LAB — PREGNANCY, URINE: PREG TEST UR: NEGATIVE

## 2018-07-01 NOTE — ED Notes (Signed)
Pt came to nursing station and stated that she wanted to leave the hospital. She said that she came in voluntarily and would like to leave. She was advised that it was in her best interest to stay in the hospital to be evaluated for her chief complaint. She continued to refuse tx and requested to be discharged. Dr. Judd Lien was informed of her request. She was encouraged to stay for additional tx but was ultimately granted discharge since she denied SI/HI. Pt CAOx4. Discharge papers provided.

## 2018-07-01 NOTE — Discharge Instructions (Addendum)
You may return to the emergency department at any time should you wish to speak with TTS representatives as was offered this evening.

## 2018-07-01 NOTE — ED Notes (Signed)
Patient verbalizes understanding of discharge instructions. Opportunity for questioning and answers were provided. Armband removed by staff, pt discharged from ED.  

## 2018-07-15 ENCOUNTER — Emergency Department (HOSPITAL_COMMUNITY)
Admission: EM | Admit: 2018-07-15 | Discharge: 2018-07-16 | Discharge status: Against Medical Advice | Payer: No Typology Code available for payment source | Attending: Emergency Medicine | Admitting: Emergency Medicine

## 2018-07-15 ENCOUNTER — Other Ambulatory Visit: Payer: Self-pay

## 2018-07-15 DIAGNOSIS — H05232 Hemorrhage of left orbit: Secondary | ICD-10-CM

## 2018-07-15 DIAGNOSIS — Z79899 Other long term (current) drug therapy: Secondary | ICD-10-CM | POA: Insufficient documentation

## 2018-07-15 DIAGNOSIS — S0012XA Contusion of left eyelid and periocular area, initial encounter: Secondary | ICD-10-CM | POA: Diagnosis not present

## 2018-07-15 DIAGNOSIS — S069X9A Unspecified intracranial injury with loss of consciousness of unspecified duration, initial encounter: Secondary | ICD-10-CM | POA: Diagnosis not present

## 2018-07-15 DIAGNOSIS — S01511A Laceration without foreign body of lip, initial encounter: Secondary | ICD-10-CM | POA: Insufficient documentation

## 2018-07-15 DIAGNOSIS — F1310 Sedative, hypnotic or anxiolytic abuse, uncomplicated: Secondary | ICD-10-CM | POA: Insufficient documentation

## 2018-07-15 DIAGNOSIS — Y999 Unspecified external cause status: Secondary | ICD-10-CM | POA: Insufficient documentation

## 2018-07-15 DIAGNOSIS — S3991XA Unspecified injury of abdomen, initial encounter: Secondary | ICD-10-CM | POA: Insufficient documentation

## 2018-07-15 DIAGNOSIS — Y9389 Activity, other specified: Secondary | ICD-10-CM | POA: Diagnosis not present

## 2018-07-15 DIAGNOSIS — F1910 Other psychoactive substance abuse, uncomplicated: Secondary | ICD-10-CM

## 2018-07-15 DIAGNOSIS — Y92410 Unspecified street and highway as the place of occurrence of the external cause: Secondary | ICD-10-CM | POA: Insufficient documentation

## 2018-07-15 DIAGNOSIS — Y906 Blood alcohol level of 120-199 mg/100 ml: Secondary | ICD-10-CM | POA: Diagnosis not present

## 2018-07-15 DIAGNOSIS — F1020 Alcohol dependence, uncomplicated: Secondary | ICD-10-CM

## 2018-07-15 DIAGNOSIS — F10920 Alcohol use, unspecified with intoxication, uncomplicated: Secondary | ICD-10-CM | POA: Diagnosis present

## 2018-07-15 LAB — COMPREHENSIVE METABOLIC PANEL
ALT: 26 U/L (ref 0–44)
AST: 23 U/L (ref 15–41)
Albumin: 4.1 g/dL (ref 3.5–5.0)
Alkaline Phosphatase: 65 U/L (ref 38–126)
Anion gap: 10 (ref 5–15)
BUN: 7 mg/dL (ref 6–20)
CO2: 21 mmol/L — ABNORMAL LOW (ref 22–32)
Calcium: 9.1 mg/dL (ref 8.9–10.3)
Chloride: 109 mmol/L (ref 98–111)
Creatinine, Ser: 0.71 mg/dL (ref 0.44–1.00)
GFR calc Af Amer: >60 mL/min (ref >60)
GFR calc non Af Amer: >60 mL/min (ref >60)
Glucose, Bld: 87 mg/dL (ref 70–99)
Potassium: 3.2 mmol/L — ABNORMAL LOW (ref 3.5–5.1)
Sodium: 140 mmol/L (ref 135–145)
Total Bilirubin: 0.5 mg/dL (ref 0.3–1.2)
Total Protein: 6.9 g/dL (ref 6.5–8.1)

## 2018-07-15 LAB — CBC
HCT: 40.6 % (ref 36.0–46.0)
Hemoglobin: 13.3 g/dL (ref 12.0–15.0)
MCH: 32.1 pg (ref 26.0–34.0)
MCHC: 32.8 g/dL (ref 30.0–36.0)
MCV: 98.1 fL (ref 80.0–100.0)
Platelets: 372 10*3/uL (ref 150–400)
RBC: 4.14 MIL/uL (ref 3.87–5.11)
RDW: 12.5 % (ref 11.5–15.5)
WBC: 9.6 10*3/uL (ref 4.0–10.5)
nRBC: 0 % (ref 0.0–0.2)

## 2018-07-15 LAB — ETHANOL: Alcohol, Ethyl (B): 174 mg/dL — ABNORMAL HIGH (ref <10)

## 2018-07-15 LAB — LACTIC ACID, PLASMA: Lactic Acid, Venous: 1.7 mmol/L (ref 0.5–1.9)

## 2018-07-15 LAB — SALICYLATE LEVEL: Salicylate Lvl: <7 mg/dL (ref 2.8–30.0)

## 2018-07-15 LAB — I-STAT BETA HCG BLOOD, ED (MC, WL, AP ONLY): I-stat hCG, quantitative: <5 m[IU]/mL (ref <5)

## 2018-07-15 LAB — PROTIME-INR
INR: 1 (ref 0.8–1.2)
Prothrombin Time: 12.9 seconds (ref 11.4–15.2)

## 2018-07-15 LAB — CK: Total CK: 213 U/L (ref 38–234)

## 2018-07-15 LAB — ACETAMINOPHEN LEVEL: Acetaminophen (Tylenol), Serum: <10 ug/mL — ABNORMAL LOW (ref 10–30)
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
## 2018-07-15 NOTE — ED Notes (Signed)
[name] (father)- [phone]

## 2018-07-15 NOTE — ED Notes (Signed)
Pt very upset on phone with unknown person. Door closed as to not disrupt others
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
## 2018-07-15 NOTE — ED Notes (Signed)
Pt still on phone refusing to have vitals done or be examined until done with phone.  EDP remains at bedside

## 2018-07-15 NOTE — ED Notes (Signed)
Pt transferred over to ED stretcher by EMS.  Upon arrival to room pt did not want to be seen.  EDP at bedside to discuss risks with patient.  Pt demanding phone and does not want treatment until she can call someone.

## 2018-07-16 ENCOUNTER — Other Ambulatory Visit (HOSPITAL_COMMUNITY): Payer: Self-pay

## 2018-07-16 ENCOUNTER — Emergency Department (HOSPITAL_COMMUNITY): Payer: No Typology Code available for payment source

## 2018-07-16 LAB — URINALYSIS, ROUTINE W REFLEX MICROSCOPIC
Bilirubin Urine: NEGATIVE
Glucose, UA: NEGATIVE mg/dL
Ketones, ur: NEGATIVE mg/dL
Leukocytes,Ua: NEGATIVE
Nitrite: NEGATIVE
Protein, ur: NEGATIVE mg/dL
Specific Gravity, Urine: 1.003 — ABNORMAL LOW (ref 1.005–1.030)
pH: 6 (ref 5.0–8.0)

## 2018-07-16 LAB — RAPID URINE DRUG SCREEN, HOSP PERFORMED
Amphetamines: NOT DETECTED
Barbiturates: NOT DETECTED
Benzodiazepines: POSITIVE — AB
Cocaine: NOT DETECTED
Opiates: NOT DETECTED
Tetrahydrocannabinol: POSITIVE — AB

## 2018-07-16 MED ORDER — IOHEXOL 300 MG/ML  SOLN
100.0000 mL | Freq: Once | INTRAMUSCULAR | Status: AC | PRN
  Administered 2018-07-16: 100 mL via INTRAVENOUS

## 2018-07-16 NOTE — ED Notes (Signed)
Pt not visualized in room.  Checked surrounding area.  No pt visualized.  IV visualized on ground with other EKG leads.  Pt discharged from system.  EDP aware.

## 2018-12-04 ENCOUNTER — Other Ambulatory Visit: Payer: Self-pay

## 2018-12-04 DIAGNOSIS — Z20822 Contact with and (suspected) exposure to covid-19: Secondary | ICD-10-CM

## 2018-12-05 LAB — NOVEL CORONAVIRUS, NAA: SARS-CoV-2, NAA: NOT DETECTED

## 2019-04-08 IMAGING — DX DG CHEST 1V PORT
1 series · 1 of 1 positions shown · non-contrast
Comparison: 07/17/2016

CLINICAL DATA: Endotracheal tube placement.

EXAM:
PORTABLE CHEST 1 VIEW

[chest ap]
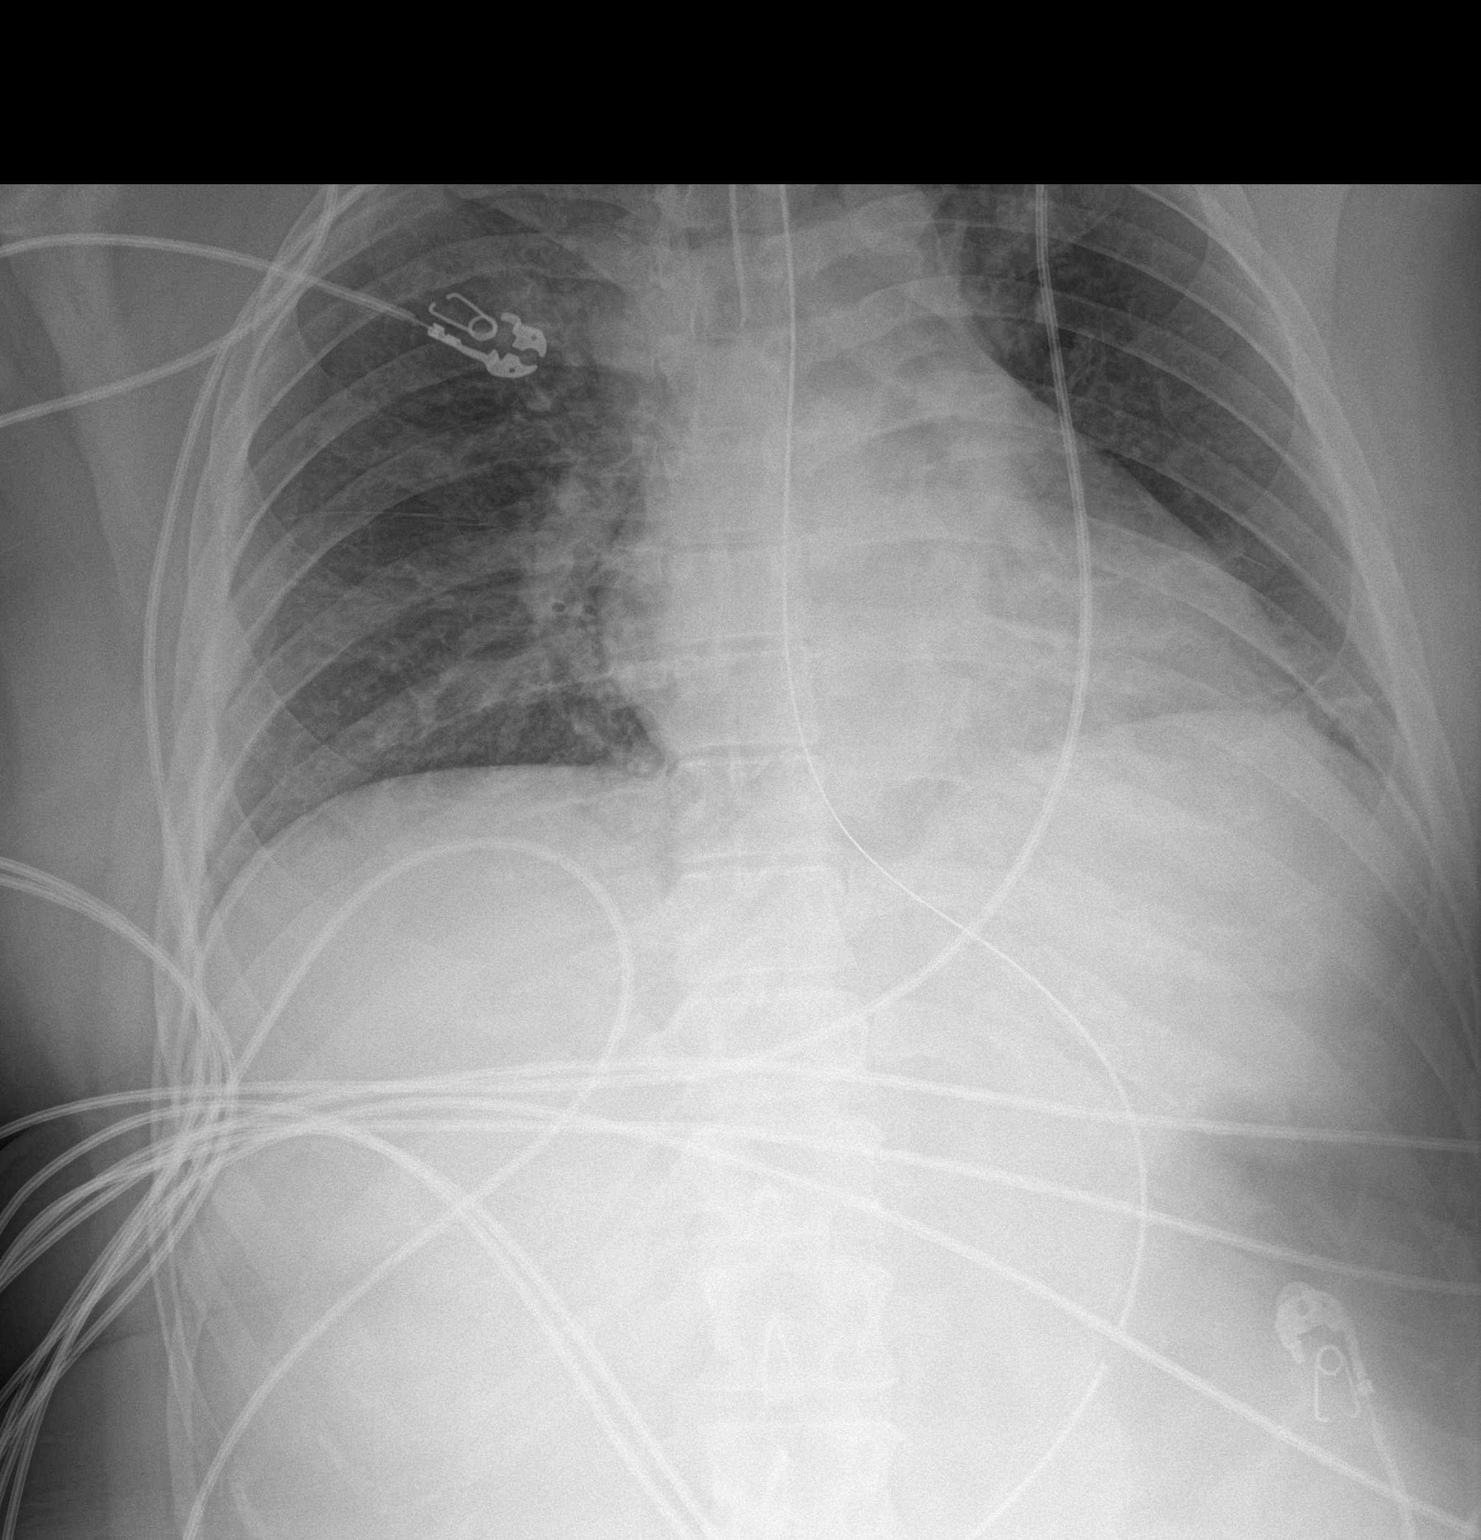

[1 of 1 positions shown; findings below may reference images not displayed]

FINDINGS: The endotracheal tube is at the carina. Enteric tube in place with
tip and side-port below the diaphragm. Low lung volumes with
bibasilar atelectasis. Upper normal heart size. No pulmonary edema.
No pneumothorax or pleural effusion.
IMPRESSION: 1. Endotracheal tube tip at the carina, recommend retraction of 2
cm.
2. Enteric tube tip and side-port below the diaphragm in the
stomach.
3. Low lung volumes with bibasilar atelectasis. Prominent heart size
likely accentuated by technique.

These results were called by telephone at the time of interpretation
on 06/27/2017 at [DATE] to Dr. Montewa , who verbally acknowledged
these results.

## 2019-04-09 IMAGING — DX DG ANKLE 2V *L*
2 series · 2 of 2 positions shown · non-contrast
Comparison: None.

CLINICAL DATA: Ankle pain

EXAM:
LEFT ANKLE - 2 VIEW

[ankle ap]
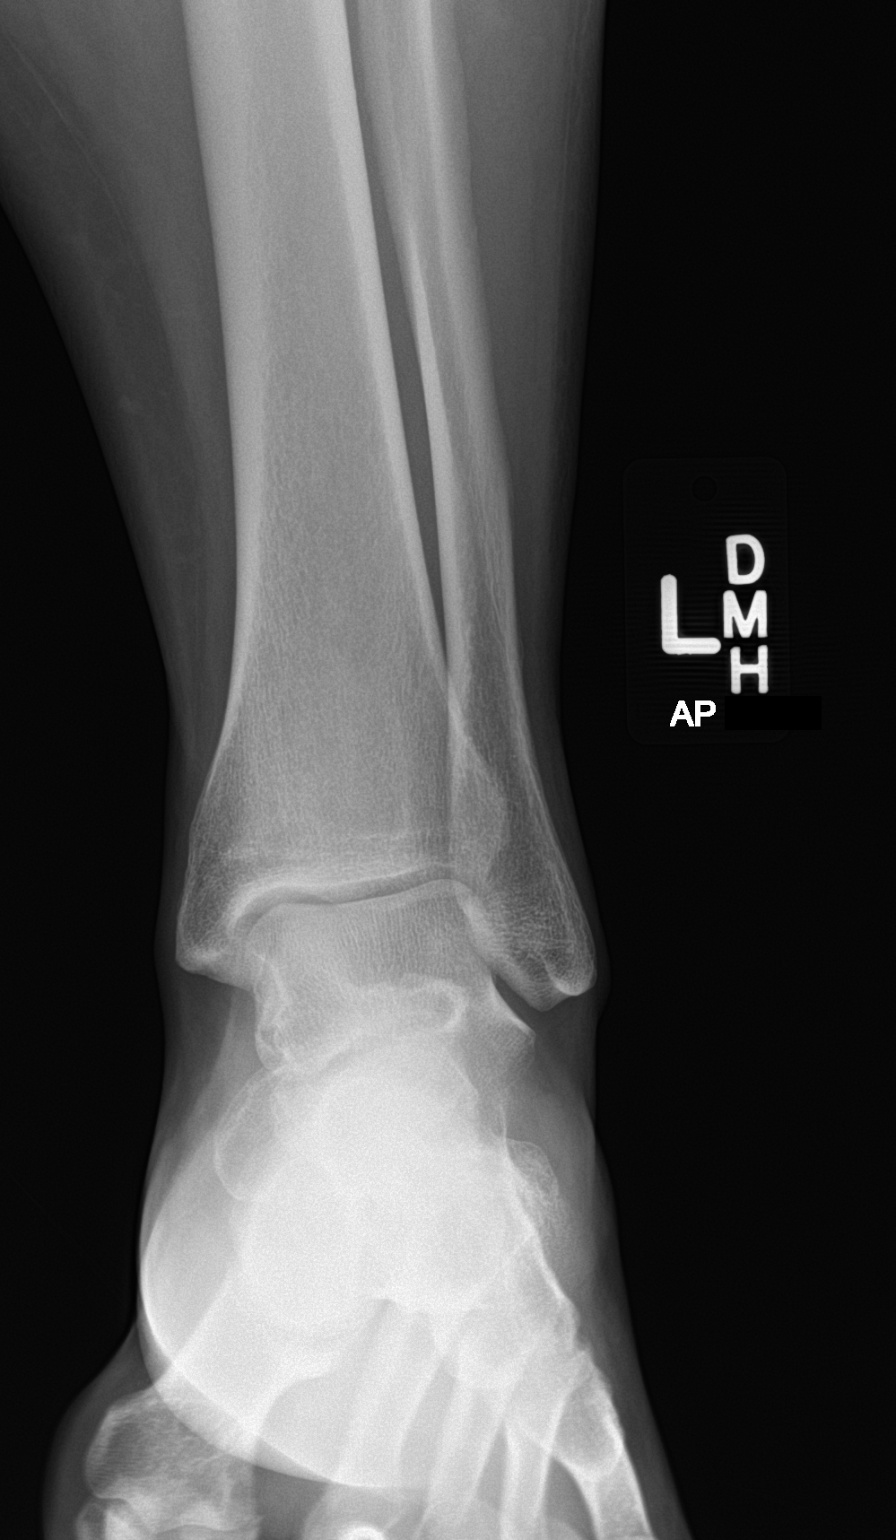

[ankle lat]
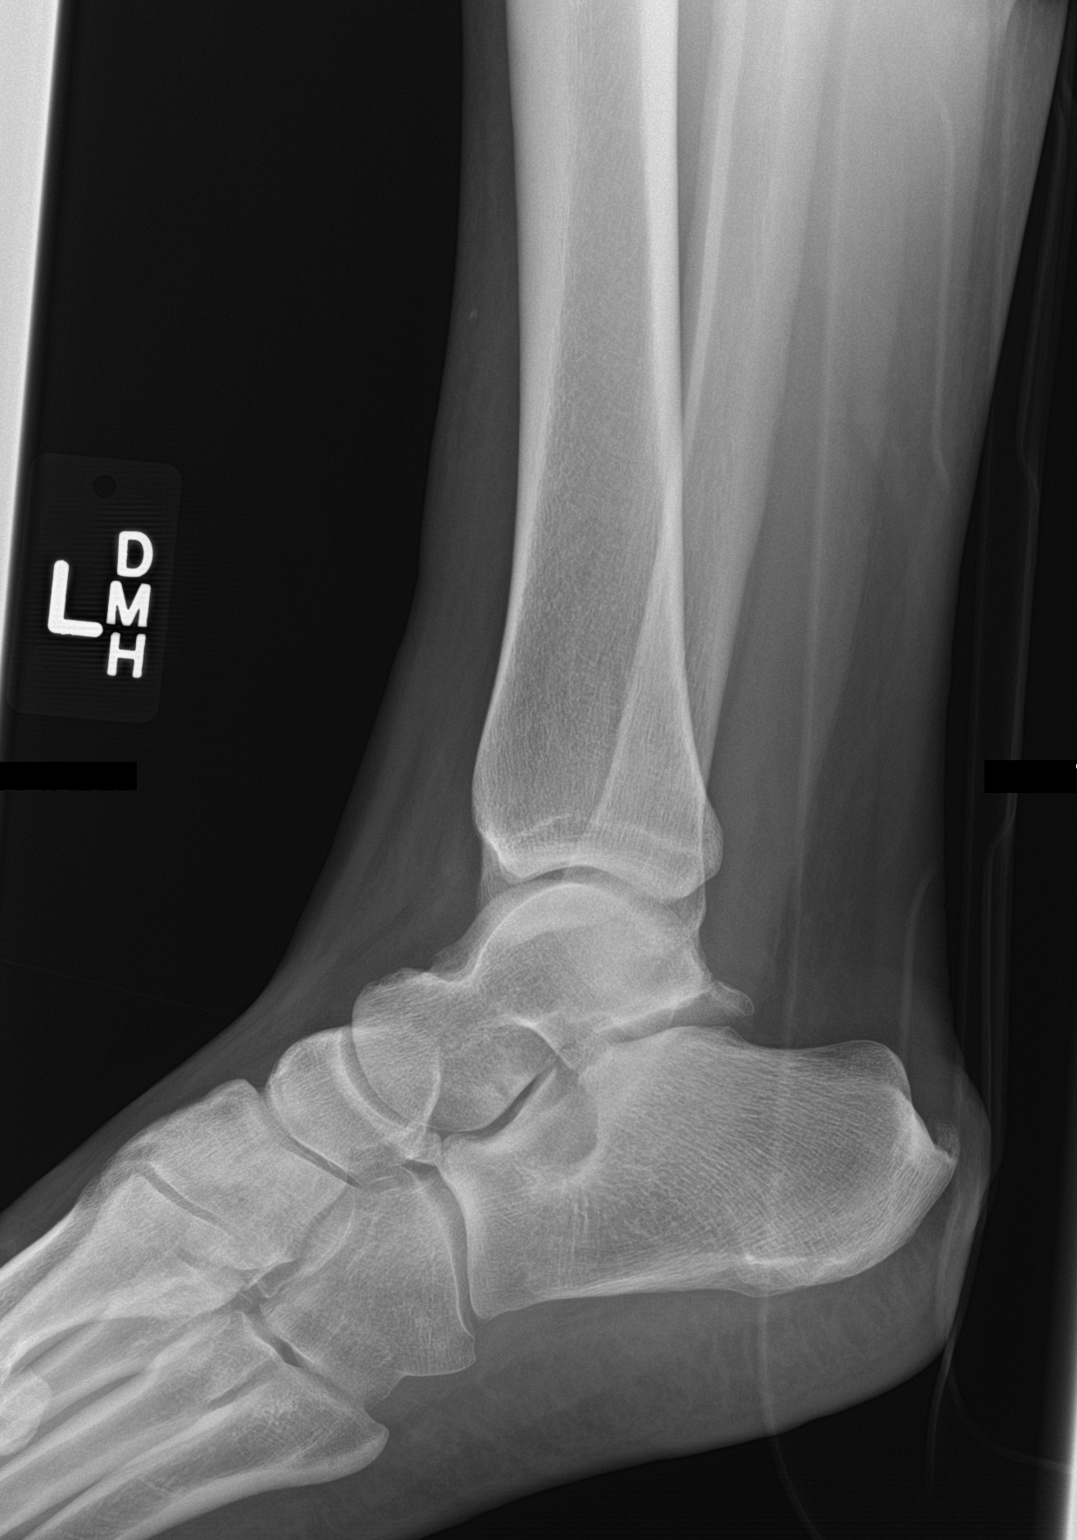

[2 of 2 positions shown; findings below may reference images not displayed]

FINDINGS: There is no evidence of fracture, dislocation, or joint effusion.
Soft tissues are unremarkable. Small calcaneal spur is noted along
the Achilles insertion.
IMPRESSION: No acute abnormality noted.

## 2019-08-13 ENCOUNTER — Encounter (HOSPITAL_COMMUNITY): Payer: Self-pay | Admitting: *Deleted

## 2019-08-13 ENCOUNTER — Emergency Department (HOSPITAL_COMMUNITY): Payer: Self-pay

## 2019-08-13 ENCOUNTER — Emergency Department (HOSPITAL_COMMUNITY)
Admission: EM | Admit: 2019-08-13 | Discharge: 2019-08-13 | Disposition: A | Discharge status: Home / Self Care | Payer: Self-pay | Attending: Emergency Medicine | Admitting: Emergency Medicine

## 2019-08-13 ENCOUNTER — Other Ambulatory Visit: Payer: Self-pay

## 2019-08-13 DIAGNOSIS — Y9389 Activity, other specified: Secondary | ICD-10-CM | POA: Insufficient documentation

## 2019-08-13 DIAGNOSIS — F329 Major depressive disorder, single episode, unspecified: Secondary | ICD-10-CM

## 2019-08-13 DIAGNOSIS — Z79899 Other long term (current) drug therapy: Secondary | ICD-10-CM | POA: Insufficient documentation

## 2019-08-13 DIAGNOSIS — F1910 Other psychoactive substance abuse, uncomplicated: Secondary | ICD-10-CM | POA: Insufficient documentation

## 2019-08-13 DIAGNOSIS — Y999 Unspecified external cause status: Secondary | ICD-10-CM | POA: Insufficient documentation

## 2019-08-13 DIAGNOSIS — Z20822 Contact with and (suspected) exposure to covid-19: Secondary | ICD-10-CM | POA: Insufficient documentation

## 2019-08-13 DIAGNOSIS — Y92008 Other place in unspecified non-institutional (private) residence as the place of occurrence of the external cause: Secondary | ICD-10-CM | POA: Insufficient documentation

## 2019-08-13 DIAGNOSIS — Z638 Other specified problems related to primary support group: Secondary | ICD-10-CM

## 2019-08-13 DIAGNOSIS — W500XXA Accidental hit or strike by another person, initial encounter: Secondary | ICD-10-CM | POA: Insufficient documentation

## 2019-08-13 DIAGNOSIS — F319 Bipolar disorder, unspecified: Secondary | ICD-10-CM | POA: Insufficient documentation

## 2019-08-13 DIAGNOSIS — F32A Depression, unspecified: Secondary | ICD-10-CM

## 2019-08-13 DIAGNOSIS — F17210 Nicotine dependence, cigarettes, uncomplicated: Secondary | ICD-10-CM | POA: Insufficient documentation

## 2019-08-13 DIAGNOSIS — F1320 Sedative, hypnotic or anxiolytic dependence, uncomplicated: Secondary | ICD-10-CM | POA: Diagnosis present

## 2019-08-13 DIAGNOSIS — R45851 Suicidal ideations: Secondary | ICD-10-CM | POA: Insufficient documentation

## 2019-08-13 DIAGNOSIS — S0990XA Unspecified injury of head, initial encounter: Secondary | ICD-10-CM | POA: Insufficient documentation

## 2019-08-13 LAB — CBC
HCT: 38.6 % (ref 36.0–46.0)
Hemoglobin: 13.4 g/dL (ref 12.0–15.0)
MCH: 34.6 pg — ABNORMAL HIGH (ref 26.0–34.0)
MCHC: 34.7 g/dL (ref 30.0–36.0)
MCV: 99.7 fL (ref 80.0–100.0)
Platelets: 308 10*3/uL (ref 150–400)
RBC: 3.87 MIL/uL (ref 3.87–5.11)
RDW: 11.9 % (ref 11.5–15.5)
WBC: 9.4 10*3/uL (ref 4.0–10.5)
nRBC: 0 % (ref 0.0–0.2)

## 2019-08-13 LAB — COMPREHENSIVE METABOLIC PANEL
ALT: 16 U/L (ref 0–44)
AST: 21 U/L (ref 15–41)
Albumin: 4.4 g/dL (ref 3.5–5.0)
Alkaline Phosphatase: 45 U/L (ref 38–126)
Anion gap: 8 (ref 5–15)
BUN: 12 mg/dL (ref 6–20)
CO2: 21 mmol/L — ABNORMAL LOW (ref 22–32)
Calcium: 9.1 mg/dL (ref 8.9–10.3)
Chloride: 109 mmol/L (ref 98–111)
Creatinine, Ser: 0.75 mg/dL (ref 0.44–1.00)
GFR calc Af Amer: >60 mL/min (ref >60)
GFR calc non Af Amer: >60 mL/min (ref >60)
Glucose, Bld: 96 mg/dL (ref 70–99)
Potassium: 3.4 mmol/L — ABNORMAL LOW (ref 3.5–5.1)
Sodium: 138 mmol/L (ref 135–145)
Total Bilirubin: 0.6 mg/dL (ref 0.3–1.2)
Total Protein: 7.4 g/dL (ref 6.5–8.1)

## 2019-08-13 LAB — I-STAT BETA HCG BLOOD, ED (MC, WL, AP ONLY): I-stat hCG, quantitative: <5 m[IU]/mL (ref <5)

## 2019-08-13 LAB — ACETAMINOPHEN LEVEL: Acetaminophen (Tylenol), Serum: <10 ug/mL — ABNORMAL LOW (ref 10–30)

## 2019-08-13 LAB — RAPID URINE DRUG SCREEN, HOSP PERFORMED
Amphetamines: NOT DETECTED
Barbiturates: NOT DETECTED
Benzodiazepines: POSITIVE — AB
Cocaine: NOT DETECTED
Opiates: NOT DETECTED
Tetrahydrocannabinol: POSITIVE — AB

## 2019-08-13 LAB — RESPIRATORY PANEL BY RT PCR (FLU A&B, COVID)
Influenza A by PCR: NEGATIVE
Influenza B by PCR: NEGATIVE
SARS Coronavirus 2 by RT PCR: NEGATIVE

## 2019-08-13 LAB — ETHANOL: Alcohol, Ethyl (B): <10 mg/dL (ref <10)

## 2019-08-13 LAB — SALICYLATE LEVEL: Salicylate Lvl: <7 mg/dL — ABNORMAL LOW (ref 7.0–30.0)

## 2019-08-13 MED ORDER — BUSPIRONE HCL 10 MG PO TABS
10.0000 mg | ORAL_TABLET | Freq: Three times a day (TID) | ORAL | Status: DC
  Administered 2019-08-13: 09:00:00 10 mg via ORAL
  Filled 2019-08-13: qty 1

## 2019-08-13 MED ORDER — PANTOPRAZOLE SODIUM 40 MG PO TBEC: 40.0000 mg | DELAYED_RELEASE_TABLET | Freq: Every day | ORAL | Status: DC

## 2019-08-13 MED ORDER — ALPRAZOLAM 1 MG PO TABS
1.0000 mg | ORAL_TABLET | Freq: Three times a day (TID) | ORAL | Status: DC | PRN
  Administered 2019-08-13: 1 mg via ORAL
  Filled 2019-08-13: qty 1

## 2019-08-13 MED ORDER — PRAZOSIN HCL 1 MG PO CAPS: 4.0000 mg | ORAL_CAPSULE | Freq: Every day | ORAL | Status: DC

## 2019-08-13 MED ORDER — ACETAMINOPHEN 325 MG PO TABS: 650.0000 mg | ORAL_TABLET | ORAL | Status: DC | PRN

## 2019-08-13 MED ORDER — NICOTINE 21 MG/24HR TD PT24
21.0000 mg | MEDICATED_PATCH | Freq: Every day | TRANSDERMAL | Status: DC
  Administered 2019-08-13: 09:00:00 21 mg via TRANSDERMAL
  Filled 2019-08-13: qty 1

## 2019-08-13 MED ORDER — ALUM & MAG HYDROXIDE-SIMETH 200-200-20 MG/5ML PO SUSP: 30.0000 mL | Freq: Four times a day (QID) | ORAL | Status: DC | PRN

## 2019-08-13 MED ORDER — GABAPENTIN 300 MG PO CAPS
300.0000 mg | ORAL_CAPSULE | Freq: Two times a day (BID) | ORAL | Status: DC
  Administered 2019-08-13: 300 mg via ORAL
  Filled 2019-08-13: qty 1
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
## 2019-08-13 NOTE — ED Notes (Signed)
Pt alert, anxious.

## 2019-08-13 NOTE — Progress Notes (Signed)
Received [name] this AM from triage, shortly thereafter she was taken to xray. She was given a snack of her choice. She talked with TTS and slept throughout the morning without incident.

## 2019-08-13 NOTE — Discharge Instructions (Signed)
For your mental health needs, you are advised to continue treatment with [facility]:       [facility]      [address]      [place], [place] [address]      [phone]      Crisis number: [phone]  
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
## 2019-08-13 NOTE — BH Assessment (Signed)
BHH Assessment Progress Note  Per Shuvon Rankin, FNP, this pt does not require psychiatric hospitalization at this time.  Pt presents under IVC initiated by pt's mother and upheld by EDP Shelly Coss, which has been rescinded by EDP Bethann Berkshire, MD.  Pt is to be discharged from [facility] with recommendation to continue treatment with [facility].  This has been included in pt's discharge instructions.  Pt's nurse, Waynetta Sandy, has been notified.  Doylene Canning, MA Triage Specialist [phone]

## 2019-08-13 NOTE — ED Notes (Signed)
Pt DCd off unit to home per provider. Pt alert, calm, cooperative, no s/s of distress.  DC information given to and reviewed with pt, pt acknowledged understanding. Belongings given to pt. Pt ambulatory off unit, escorted by RN. Pt given bus pass, pt using bus pass for transportation.
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
## 2019-08-13 NOTE — ED Notes (Signed)
Pt resting comfortably in bed
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
## 2019-11-10 ENCOUNTER — Ambulatory Visit (HOSPITAL_COMMUNITY): Payer: No Payment, Other | Admitting: Licensed Clinical Social Worker

## 2019-11-11 ENCOUNTER — Other Ambulatory Visit: Payer: Self-pay

## 2019-11-11 ENCOUNTER — Ambulatory Visit (HOSPITAL_COMMUNITY): Payer: No Payment, Other | Admitting: Licensed Clinical Social Worker

## 2019-12-05 ENCOUNTER — Other Ambulatory Visit: Payer: Self-pay

## 2019-12-05 ENCOUNTER — Other Ambulatory Visit (HOSPITAL_COMMUNITY): Payer: Self-pay | Admitting: Psychiatry

## 2019-12-05 ENCOUNTER — Encounter (HOSPITAL_COMMUNITY): Payer: Self-pay | Admitting: Psychiatry

## 2019-12-05 ENCOUNTER — Telehealth (INDEPENDENT_AMBULATORY_CARE_PROVIDER_SITE_OTHER): Payer: No Payment, Other | Admitting: Psychiatry

## 2019-12-05 DIAGNOSIS — F1121 Opioid dependence, in remission: Secondary | ICD-10-CM

## 2019-12-05 DIAGNOSIS — F331 Major depressive disorder, recurrent, moderate: Secondary | ICD-10-CM | POA: Diagnosis not present

## 2019-12-05 DIAGNOSIS — F1320 Sedative, hypnotic or anxiolytic dependence, uncomplicated: Secondary | ICD-10-CM | POA: Diagnosis not present

## 2019-12-05 DIAGNOSIS — F419 Anxiety disorder, unspecified: Secondary | ICD-10-CM | POA: Insufficient documentation

## 2019-12-05 DIAGNOSIS — F4310 Post-traumatic stress disorder, unspecified: Secondary | ICD-10-CM | POA: Insufficient documentation

## 2019-12-05 DIAGNOSIS — F1421 Cocaine dependence, in remission: Secondary | ICD-10-CM

## 2019-12-05 DIAGNOSIS — F1321 Sedative, hypnotic or anxiolytic dependence, in remission: Secondary | ICD-10-CM | POA: Insufficient documentation

## 2019-12-05 MED ORDER — GABAPENTIN 400 MG PO CAPS
400.0000 mg | ORAL_CAPSULE | Freq: Three times a day (TID) | ORAL | 1 refills | Status: DC
Start: 2019-12-05 — End: 2020-01-30

## 2019-12-05 MED ORDER — MIRTAZAPINE 15 MG PO TABS: 15.0000 mg | ORAL_TABLET | Freq: Every day | ORAL | 1 refills | Status: DC

## 2019-12-05 MED ORDER — BUSPIRONE HCL 15 MG PO TABS: 15.0000 mg | ORAL_TABLET | Freq: Three times a day (TID) | ORAL | 1 refills | Status: DC

## 2019-12-05 MED ORDER — PRAZOSIN HCL 2 MG PO CAPS: 2.0000 mg | ORAL_CAPSULE | Freq: Every day | ORAL | 1 refills | Status: DC

## 2019-12-05 MED FILL — busPIRone HCL 15 MG TABS: 15 | 30 days supply | Qty: 90 | Fill #0

## 2019-12-05 MED FILL — PRAZOSIN 2 MG CAPSULE: 2 | 30 days supply | Qty: 30 | Fill #0

## 2019-12-05 MED FILL — GABAPENTIN 400 MG CAPSULE: 400 | 30 days supply | Qty: 90 | Fill #0

## 2019-12-05 MED FILL — MIRTAZAPINE 15 MG TABLET: 15 | 30 days supply | Qty: 30 | Fill #0

## 2019-12-05 NOTE — Progress Notes (Signed)
Psychiatric Initial Adult Assessment   Virtual Visit via Video Note  I connected with [name] on 12/05/19 at 10:00 AM EDT by a video enabled telemedicine application and verified that I am speaking with the correct person using two identifiers.  Location: Patient: Home Provider: Clinic   I discussed the limitations of evaluation and management by telemedicine and the availability of in person appointments. The patient expressed understanding and agreed to proceed.  I provided 25 minutes of non-face-to-face time during this encounter.     Patient Identification: [name] MRN:  [id number] Date of Evaluation:  12/05/2019   Referral Source: Vesta Mixer  Chief Complaint:   " I am doing okay but can be better."  Visit Diagnosis:    ICD-10-CM   1. MDD (major depressive disorder), recurrent episode, moderate (HCC)  F33.1   2. Opioid use disorder, moderate, in sustained remission (HCC)  F11.21   3. Benzodiazepine dependence, continuous (HCC)  F13.20   4. Cocaine use disorder, moderate, in sustained remission (HCC)  F14.21   5. Anxiety  F41.9     History of Present Illness: This is a 39 year old female with history of MDD, polysubstance abuse, anxiety, PTSD now seen for establishing care.  Patient reported that she used to be seen at [facility] and she knows that they have prescribed her several medications however due to their cost she was only taking buspirone 15 mg 3 times daily, Neurontin 400 mg 3 times daily, prazosin 2 mg at bedtime.  She stated that she still using Xanax which she is getting from other sources because none of the providers are agreeable to prescribed to her.  She hesitantly asked if the writer will prescribe it to her although she did expect the writer would see no. Writer informed her the answer was no, she stated that she is in a try another provider to see if he would be willing to write a prescription for. She stated that her mood can be  depressed and irritable at times.  She stated that mirtazapine did help her with her depressive symptoms and also helped with sleep better.  However due to the cost she has not been taking it for the past few months.  She stated that she has had a lot of financial issues and she lost the place that she was living at.  She has been staying with her parents for the past few months which has its purpose and also its downsides.  She stated that she has been working at [employer] regularly and her dad is usually the one who provides with transportation to get around. She stated that she was worried about the cost of the medications.  Writer informed her about community [facility] wellness center where she can get many of the medication for zero cost.  Patient was quite happy to hear that and was agreeable for her prescriptions to be sent to the pharmacy.  She requested for prescription to mirtazapine to be sent as well as she found it beneficial in the past. She stated that she has had mood swings but mostly in the context of when she was using illicit substances. She stated that she stopped using pain pills and has not used cocaine in a while. She does believe that she has significant anxiety and therefore Xanax is something that she really needs to be continued on. She denied any other concerns at this time. She denied any manic or psychotic symptoms.   Past Psychiatric History:  MDD, PTSD, polysubstance abuse  Previous Psychotropic Medications: Yes   Substance Abuse History in the last 12 months:  Yes.    Consequences of Substance Abuse: Negative  Past Medical History:  Past Medical History:  Diagnosis Date   Anxiety    Benzodiazepine abuse (HCC)    Bipolar 1 disorder (HCC)    Depression    Drug abuse (HCC)    ETOH abuse    Polysubstance abuse (HCC)    History reviewed. No pertinent surgical history.  Family Psychiatric History: denied  Family History: History reviewed. No  pertinent family history.  Social History:   Social History   Socioeconomic History   Marital status: Single    Spouse name: Not on file   Number of children: Not on file   Years of education: Not on file   Highest education level: Not on file  Occupational History   Not on file  Tobacco Use   Smoking status: Current Every Day Smoker    Packs/day: 2.00    Years: 17.00    Pack years: 34.00    Types: Cigarettes   Smokeless tobacco: Never Used  Building services engineerVaping Use   Vaping Use: Never used  Substance and Sexual Activity   Alcohol use: Yes    Comment: +63   Drug use: Yes    Types: Marijuana, IV    Comment: UDS + THC   Sexual activity: Never  Other Topics Concern   Not on file  Social History Narrative   Not on file   Social Determinants of Health   Financial Resource Strain:    Difficulty of Paying Living Expenses: Not on file  Food Insecurity:    Worried About Programme researcher, broadcasting/film/videounning Out of Food in the Last Year: Not on file   [employer] Out of Food in the Last Year: Not on file  Transportation Needs:    Lack of Transportation (Medical): Not on file   Lack of Transportation (Non-Medical): Not on file  Physical Activity:    Days of Exercise per Week: Not on file   Minutes of Exercise per Session: Not on file  Stress:    Feeling of Stress : Not on file  Social Connections:    Frequency of Communication with Friends and Family: Not on file   Frequency of Social Gatherings with Friends and Family: Not on file   Attends Religious Services: Not on file   Active Member of Clubs or Organizations: Not on file   Attends BankerClub or Organization Meetings: Not on file   Marital Status: Not on file    Additional Social History: Currently living with her parents, single, has no children.  Works at [employer].  Allergies:   Allergies  Allergen Reactions   Haldol [Haloperidol Lactate] Other (See Comments)    Makes whole body stiff    Metabolic Disorder Labs: Lab Results   Component Value Date   HGBA1C 5.2 02/24/2016   No results found for: PROLACTIN Lab Results  Component Value Date   TRIG 106 06/27/2017   Lab Results  Component Value Date   TSH 1.29 02/24/2016    Therapeutic Level Labs: No results found for: LITHIUM Lab Results  Component Value Date   CBMZ <2.0 (L) 11/21/2016   Lab Results  Component Value Date   VALPROATE 74.4 04/26/2010    Current Medications: Current Outpatient Medications  Medication Sig Dispense Refill   ALPRAZolam (XANAX) 1 MG tablet Take 1 mg by mouth 3 (three) times daily as needed for anxiety.  busPIRone (BUSPAR) 10 MG tablet Take 10 mg by mouth 3 (three) times daily.     gabapentin (NEURONTIN) 300 MG capsule Take 1 capsule (300 mg total) by mouth 2 (two) times daily. For numbness (Patient taking differently: Take 300 mg by mouth 3 (three) times daily. For numbness) 60 capsule 2   omeprazole (PRILOSEC OTC) 20 MG tablet Take 20 mg by mouth daily.     prazosin (MINIPRESS) 2 MG capsule Take 2 capsules (4 mg total) by mouth at bedtime. 60 capsule 0   No current facility-administered medications for this visit.    Psychiatric Specialty Exam: Review of Systems  There were no vitals taken for this visit.There is no height or weight on file to calculate BMI.  General Appearance: Fairly Groomed  Eye Contact:  Good  Speech:  Clear and Coherent and Normal Rate  Volume:  Normal  Mood:  Depressed  Affect:  Congruent  Thought Process:  Goal Directed and Descriptions of Associations: Intact  Orientation:  Full (Time, Place, and Person)  Thought Content:  Logical  Suicidal Thoughts:  No  Homicidal Thoughts:  No  Memory:  Immediate;   Good Recent;   Good  Judgement:  Fair  Insight:  Fair  Psychomotor Activity:  Normal  Concentration:  Concentration: Good and Attention Span: Good  Recall:  Good  Fund of Knowledge:Good  Language: Good  Akathisia:  Negative  Handed:  Right  AIMS (if indicated):  Not done   Assets:  Communication Skills Desire for Improvement Financial Resources/Insurance Housing  ADL's:  Intact  Cognition: WNL  Sleep:  Fair   Screenings: AIMS     [facility]  AIMS Total Score 0    AUDIT     [facility]) [facility] Ad[facility] (Dis[facility]  Alcohol Use Disorder Identification Test Final Score (AUDIT) 22 29 33    GAD-7     Office Visit from 06/05/2016 in [facility] Office Visit from 04[facility]05/2016 in [facility] Office Visit from 02/24/2016 in [facility]  Total GAD-7 Score 11 9 10     PHQ2-9     Office Visit from 06/05/2016 in [facility] Office Visit from 04/05/2016 in [facility] Office Visit from 02/24/2016 in [facility]  PHQ-2 Total Score 2 2 2   PHQ-9 Total Score 9 9 6       Assessment and Plan: Patient appears to be dealing with some ongoing depressive symptoms.  She is agreeable to restart mirtazapine and agreeable with the plan of all her prescriptions be sent to her pharmacy where the cost is more affordable.  She is still asking for Xanax although she knows it is not recommended medication.  1. MDD (major depressive disorder), recurrent episode, moderate (HCC)  - prazosin (MINIPRESS) 2 MG capsule; Take 1 capsule (2 mg total) by mouth at bedtime.  Dispense: 30 capsule; Refill: 1 - mirtazapine (REMERON) 15 MG tablet; Take 1 tablet (15 mg total) by mouth at bedtime.  Dispense: 30 tablet; Refill: 1  2. Opioid use disorder, moderate, in sustained remission (HCC)   3. Benzodiazepine dependence, continuous  (HCC)   4. Cocaine use disorder, moderate, in sustained remission (HCC)  5. Anxiety - busPIRone (BUSPAR) 15 MG tablet; Take 1 tablet (15 mg total) by mouth 3 (three) times daily.  Dispense: 90 tablet; Refill: 1  6. PTSD (post-traumatic stress disorder) - prazosin (MINIPRESS) 2 MG capsule; Take 1 capsule (2 mg total) by mouth at bedtime.  Dispense: 30 capsule; Refill: 1  Patient was encouraged to cut down on her Xanax use. Follow-up in 2 months.  [name], MD 8/27/202110:47 AM

## 2020-01-06 ENCOUNTER — Emergency Department (HOSPITAL_BASED_OUTPATIENT_CLINIC_OR_DEPARTMENT_OTHER)
Admission: EM | Admit: 2020-01-06 | Discharge: 2020-01-06 | Disposition: A | Discharge status: Home / Self Care | Payer: Self-pay | Attending: Emergency Medicine | Admitting: Emergency Medicine

## 2020-01-06 ENCOUNTER — Other Ambulatory Visit: Payer: Self-pay

## 2020-01-06 ENCOUNTER — Encounter (HOSPITAL_BASED_OUTPATIENT_CLINIC_OR_DEPARTMENT_OTHER): Payer: Self-pay | Admitting: *Deleted

## 2020-01-06 ENCOUNTER — Emergency Department (HOSPITAL_BASED_OUTPATIENT_CLINIC_OR_DEPARTMENT_OTHER): Payer: Self-pay

## 2020-01-06 DIAGNOSIS — Z20822 Contact with and (suspected) exposure to covid-19: Secondary | ICD-10-CM | POA: Insufficient documentation

## 2020-01-06 DIAGNOSIS — L732 Hidradenitis suppurativa: Secondary | ICD-10-CM | POA: Insufficient documentation

## 2020-01-06 DIAGNOSIS — R509 Fever, unspecified: Secondary | ICD-10-CM | POA: Insufficient documentation

## 2020-01-06 DIAGNOSIS — F17210 Nicotine dependence, cigarettes, uncomplicated: Secondary | ICD-10-CM | POA: Insufficient documentation

## 2020-01-06 DIAGNOSIS — R1084 Generalized abdominal pain: Secondary | ICD-10-CM | POA: Insufficient documentation

## 2020-01-06 DIAGNOSIS — R112 Nausea with vomiting, unspecified: Secondary | ICD-10-CM | POA: Insufficient documentation

## 2020-01-06 LAB — COMPREHENSIVE METABOLIC PANEL
ALT: 13 U/L (ref 0–44)
AST: 13 U/L — ABNORMAL LOW (ref 15–41)
Albumin: 4.1 g/dL (ref 3.5–5.0)
Alkaline Phosphatase: 40 U/L (ref 38–126)
Anion gap: 9 (ref 5–15)
BUN: 19 mg/dL (ref 6–20)
CO2: 22 mmol/L (ref 22–32)
Calcium: 8.9 mg/dL (ref 8.9–10.3)
Chloride: 102 mmol/L (ref 98–111)
Creatinine, Ser: 0.56 mg/dL (ref 0.44–1.00)
GFR calc Af Amer: >60 mL/min (ref >60)
GFR calc non Af Amer: >60 mL/min (ref >60)
Glucose, Bld: 107 mg/dL — ABNORMAL HIGH (ref 70–99)
Potassium: 3.3 mmol/L — ABNORMAL LOW (ref 3.5–5.1)
Sodium: 133 mmol/L — ABNORMAL LOW (ref 135–145)
Total Bilirubin: 0.7 mg/dL (ref 0.3–1.2)
Total Protein: 7.1 g/dL (ref 6.5–8.1)

## 2020-01-06 LAB — URINALYSIS, ROUTINE W REFLEX MICROSCOPIC
Glucose, UA: NEGATIVE mg/dL
Ketones, ur: NEGATIVE mg/dL
Leukocytes,Ua: NEGATIVE
Nitrite: NEGATIVE
Protein, ur: 100 mg/dL — AB
Specific Gravity, Urine: >1.03 — ABNORMAL HIGH (ref 1.005–1.030)
pH: 6 (ref 5.0–8.0)

## 2020-01-06 LAB — CBC WITH DIFFERENTIAL/PLATELET
Abs Immature Granulocytes: 0.02 10*3/uL (ref 0.00–0.07)
Basophils Absolute: 0.1 10*3/uL (ref 0.0–0.1)
Basophils Relative: 1 %
Eosinophils Absolute: 0 10*3/uL (ref 0.0–0.5)
Eosinophils Relative: 0 %
HCT: 37.3 % (ref 36.0–46.0)
Hemoglobin: 13.3 g/dL (ref 12.0–15.0)
Immature Granulocytes: 0 %
Lymphocytes Relative: 30 %
Lymphs Abs: 2.3 10*3/uL (ref 0.7–4.0)
MCH: 34.8 pg — ABNORMAL HIGH (ref 26.0–34.0)
MCHC: 35.7 g/dL (ref 30.0–36.0)
MCV: 97.6 fL (ref 80.0–100.0)
Monocytes Absolute: 0.5 10*3/uL (ref 0.1–1.0)
Monocytes Relative: 6 %
Neutro Abs: 4.8 10*3/uL (ref 1.7–7.7)
Neutrophils Relative %: 63 %
Platelets: 331 10*3/uL (ref 150–400)
RBC: 3.82 MIL/uL — ABNORMAL LOW (ref 3.87–5.11)
RDW: 11.9 % (ref 11.5–15.5)
WBC: 7.6 10*3/uL (ref 4.0–10.5)
nRBC: 0 % (ref 0.0–0.2)

## 2020-01-06 LAB — RESPIRATORY PANEL BY RT PCR (FLU A&B, COVID)
Influenza A by PCR: NEGATIVE
Influenza B by PCR: NEGATIVE
SARS Coronavirus 2 by RT PCR: NEGATIVE

## 2020-01-06 LAB — LIPASE, BLOOD: Lipase: 35 U/L (ref 11–51)

## 2020-01-06 LAB — URINALYSIS, MICROSCOPIC (REFLEX)

## 2020-01-06 LAB — PREGNANCY, URINE: Preg Test, Ur: NEGATIVE

## 2020-01-06 MED ORDER — ONDANSETRON 4 MG PO TBDP: 4.0000 mg | ORAL_TABLET | Freq: Three times a day (TID) | ORAL | 0 refills | Status: DC | PRN

## 2020-01-06 MED ORDER — SODIUM CHLORIDE 0.9 % IV BOLUS
1000.0000 mL | Freq: Once | INTRAVENOUS | Status: AC
  Administered 2020-01-06: 1000 mL via INTRAVENOUS

## 2020-01-06 MED ORDER — IOHEXOL 300 MG/ML  SOLN
100.0000 mL | Freq: Once | INTRAMUSCULAR | Status: AC | PRN
  Administered 2020-01-06: 100 mL via INTRAVENOUS

## 2020-01-06 MED ORDER — MORPHINE SULFATE (PF) 4 MG/ML IV SOLN
4.0000 mg | Freq: Once | INTRAVENOUS | Status: AC
  Administered 2020-01-06: 4 mg via INTRAVENOUS
  Filled 2020-01-06: qty 1

## 2020-01-06 MED ORDER — ONDANSETRON HCL 4 MG/2ML IJ SOLN
4.0000 mg | Freq: Once | INTRAMUSCULAR | Status: AC
  Administered 2020-01-06: 4 mg via INTRAVENOUS
  Filled 2020-01-06: qty 2

## 2020-01-06 MED ORDER — SULFAMETHOXAZOLE-TRIMETHOPRIM 800-160 MG PO TABS: 1.0000 | ORAL_TABLET | Freq: Two times a day (BID) | ORAL | 0 refills | Status: AC

## 2020-01-06 MED ORDER — DICYCLOMINE HCL 20 MG PO TABS: 20.0000 mg | ORAL_TABLET | Freq: Two times a day (BID) | ORAL | 0 refills | Status: DC

## 2020-01-06 NOTE — ED Provider Notes (Signed)
[facility] EMERGENCY DEPARTMENT Provider Note   CSN: [id number] Arrival date & time: 01/06/20  1255     History Chief Complaint  Patient presents with  . Emesis    [name] is a 39 y.o. female with past medical history significant for polysubstance abuse, EtOH abuse, benzodiazepine abuse, anxiety who presents for evaluation of abdominal pain and vomiting.  Patient states she has had multiple episodes of NBNB emesis which began on Sunday, 3 days PTA.  Patient states she is been unable to keep any solid food down due to the emesis. States she has generalized abdominal pain located to her upper abdomen.  Pain is not worse with food intake.  States she took a Zofran and a friend's Norco which "helped only a little."  Feels like she has had a fever at home however is not taken her temperature.  Is also having chills.  Has chronic diarrhea at baseline.  No melena or bright blood per rectum.  Denies headache, lightheadedness, dizziness, chest pain, shortness of breath, congestion, rhinorrhea, cough, hemoptysis, dysuria, vaginal discharge, rashes or lesions.  Patient states she does have recurrent "boils" under her left breast as well as her right axilla.  States she has been putting topical products on this.  Patient states she has had these for years and they were intermittently "flare and then disappear."  Been given antibiotics for this previously.  She denies any IV drug use to this area.  Rates her current pain a 6/10.  Denies additional aggravating or alleviating factors. No IVDU.  History obtained from patient and past medical history. No interpretor was used.   HPI     Past Medical History:  Diagnosis Date  . Anxiety   . Benzodiazepine abuse (HCC)   . Bipolar 1 disorder (HCC)   . Depression   . Drug abuse (HCC)   . ETOH abuse   . Polysubstance abuse [facility])     Patient Active Problem List   Diagnosis Date Noted  . MDD (major depressive disorder), recurrent  episode, moderate (HCC) 12/05/2019  . Cocaine use disorder, moderate, in sustained remission (HCC) 12/05/2019  . Anxiety 12/05/2019  . PTSD (post-traumatic stress disorder) 12/05/2019  . Family discord 08/13/2019  . Drug overdose 06/27/2017  . Intermittent explosive disorder 11/22/2016  . Chronic midline low back pain 05/29/2016  . Substance induced mood disorder (HCC) 06/28/2015  . 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 Polysubstance dependence (HCC) 06/06/2015  . Polysubstance dependence including opioid type drug, episodic abuse (HCC) 06/03/2015  . Polysubstance abuse (HCC) 11/01/2013  . Benzodiazepine dependence, continuous (HCC) 11/01/2013  . Bipolar I disorder, most recent episode (or current) manic (HCC) 02/25/2013  . Substance abuse/dependence 02/21/2013    History reviewed. No pertinent surgical history.   OB History   No obstetric history on file.     History reviewed. No pertinent family history.  Social History   Tobacco Use  . Smoking status: Current Every Day Smoker    Packs/day: 2.00    Years: 17.00    Pack years: 34.00    Types: Cigarettes  . Smokeless tobacco: Never Used  Vaping Use  . Vaping Use: Never used  Substance Use Topics  . Alcohol use: Yes    Comment: +63  . Drug use: Yes    Types: Marijuana, IV    Comment: UDS + THC    Home Medications Prior to Admission medications   Medication Sig Start Date End Date Taking? Authorizing Provider  ALPRAZolam (XANAX) 1 MG tablet Take 1 mg by mouth 3 (three) times daily as needed for anxiety.    [provider]  busPIRone (BUSPAR) 15 MG tablet Take 1 tablet (15 mg total) by mouth 3 (three) times daily. 12/05/19   Zena AmosKaur, Mandeep, MD  dicyclomine (BENTYL) 20 MG tablet Take 1 tablet (20 mg total) by mouth 2 (two) times daily. 01/06/20   Brae Gartman A, PA-C  gabapentin (NEURONTIN) 400 MG capsule Take 1 capsule (400 mg total) by mouth 3 (three) times daily. 12/05/19   Zena AmosKaur,  Mandeep, MD  mirtazapine (REMERON) 15 MG tablet Take 1 tablet (15 mg total) by mouth at bedtime. 12/05/19   Zena AmosKaur, Mandeep, MD  omeprazole (PRILOSEC OTC) 20 MG tablet Take 20 mg by mouth daily.    [provider]  ondansetron (ZOFRAN ODT) 4 MG disintegrating tablet Take 1 tablet (4 mg total) by mouth every 8 (eight) hours as needed for nausea or vomiting. 01/06/20   [place] Monds A, PA-C  prazosin (MINIPRESS) 2 MG capsule Take 1 capsule (2 mg total) by mouth at bedtime. 12/05/19   Zena AmosKaur, Mandeep, MD  sulfamethoxazole-trimethoprim (BACTRIM DS) 800-160 MG tablet Take 1 tablet by mouth 2 (two) times daily for 7 days. 01/06/20 01/13/20  Jillayne Witte A, PA-C    Allergies    Haldol [haloperidol lactate]  Review of Systems   Review of Systems  Constitutional: Negative.   HENT: Negative.   Respiratory: Negative.   Cardiovascular: Negative.   Gastrointestinal: Positive for abdominal pain, diarrhea (Chronic), nausea and vomiting. Negative for abdominal distention, anal bleeding, blood in stool, constipation and rectal pain.  Genitourinary: Negative.   Musculoskeletal: Negative.   Skin: Positive for rash.  Neurological: Negative.   All other systems reviewed and are negative.   Physical Exam Updated Vital Signs BP 122/78 (BP Location: Right Arm)   Pulse 78   Temp 98.7 F (37.1 C) (Oral)   Resp 16   Ht 5\' 2"  (1.575 m)   Wt 56.2 kg   LMP 12/06/2019   SpO2 100%   BMI 22.68 kg/m   Physical Exam Vitals and nursing note reviewed.  Constitutional:      General: She is not in acute distress.    Appearance: She is well-developed. She is not ill-appearing, toxic-appearing or diaphoretic.  HENT:     Head: Normocephalic and atraumatic.     Nose: Nose normal.     Mouth/Throat:     Mouth: Mucous membranes are moist.  Eyes:     Pupils: Pupils are equal, round, and reactive to light.  Cardiovascular:     Rate and Rhythm: Normal rate.     Pulses: Normal pulses.     Heart sounds:  Normal heart sounds.  Pulmonary:     Effort: Pulmonary effort is normal. No respiratory distress.     Breath sounds: Normal breath sounds.  Abdominal:     General: Bowel sounds are normal. There is no distension.     Palpations: Abdomen is soft. There is no mass.     Tenderness: There is generalized abdominal tenderness. There is no right CVA tenderness, left CVA tenderness, guarding or rebound. Negative signs include Murphy's sign and McBurney's sign.     Hernia: No hernia is present.     Comments: Generalized tenderness palpation however worse to bilateral upper quadrants.  She has negative Murphy sign, gurney point  Musculoskeletal:        General: Normal range of motion.     Cervical back:  Normal range of motion.  Skin:    General: Skin is warm and dry.     Capillary Refill: Capillary refill takes less than 2 seconds.     Comments: Areas under right axilla, left breast consistent with hidradenitis suppurativa scarring.  Mild induration consistent of 1 cm to breast crease on left breast, no fluctuance, erythema, warmth.  No nipple inversion, breast mass bilaterally  Neurological:     Mental Status: She is alert.    ED Results / Procedures / Treatments   Labs (all labs ordered are listed, but only abnormal results are displayed) Labs Reviewed  CBC WITH DIFFERENTIAL/PLATELET - Abnormal; Notable for the following components:      Result Value   RBC 3.82 (*)    MCH 34.8 (*)    All other components within normal limits  COMPREHENSIVE METABOLIC PANEL - Abnormal; Notable for the following components:   Sodium 133 (*)    Potassium 3.3 (*)    Glucose, Bld 107 (*)    AST 13 (*)    All other components within normal limits  URINALYSIS, ROUTINE W REFLEX MICROSCOPIC - Abnormal; Notable for the following components:   APPearance CLOUDY (*)    Specific Gravity, Urine >1.030 (*)    Hgb urine dipstick MODERATE (*)    Bilirubin Urine MODERATE (*)    Protein, ur 100 (*)    All other  components within normal limits  URINALYSIS, MICROSCOPIC (REFLEX) - Abnormal; Notable for the following components:   Bacteria, UA MANY (*)    All other components within normal limits  RESPIRATORY PANEL BY RT PCR (FLU A&B, COVID)  URINE CULTURE  LIPASE, BLOOD  PREGNANCY, URINE    EKG None  Radiology CT Abdomen Pelvis W Contrast  Result Date: 01/06/2020 CLINICAL DATA:  Abdominal pain.  Lower abdominal pain and tenderness EXAM: CT ABDOMEN AND PELVIS WITH CONTRAST TECHNIQUE: Multidetector CT imaging of the abdomen and pelvis was performed using the standard protocol following bolus administration of intravenous contrast. CONTRAST:  OMNIPAQUE IOHEXOL 300 MG/ML  SOLN COMPARISON:  CT abdomen 07/16/2018 FINDINGS: Lower chest: Lung bases are clear. Hepatobiliary: No focal hepatic lesion. No biliary duct dilatation. Common bile duct is normal. Pancreas: Pancreas is normal. No ductal dilatation. No pancreatic inflammation. Spleen: Normal spleen Adrenals/urinary tract: Adrenal glands and kidneys are normal. The ureters and bladder normal. Stomach/Bowel: Stomach, small bowel, appendix, and cecum are normal. The colon and rectosigmoid colon are normal. Vascular/Lymphatic: Abdominal aorta is normal caliber with atherosclerotic calcification. There is no retroperitoneal or periportal lymphadenopathy. No pelvic lymphadenopathy. Reproductive: Uterus and ovaries are normal. 1.8 cm dominant follicle in the RIGHT ovary. Small amount free fluid the pelvis is low-density. Other: No intraperitoneal free air. Musculoskeletal: No aggressive osseous lesion. IMPRESSION: 1. Normal appendix. 2. Normal gallbladder. 3. No obstructive uropathy. 4. Small volume free fluid the pelvis is favored physiologic. 5. Dominant follicle in the RIGHT ovary Electronically Signed   By: Genevive Bi M.D.   On: 01/06/2020 16:08    Procedures Procedures (including critical care time)  Medications Ordered in ED Medications  sodium  chloride 0.9 % bolus 1,000 mL (0 mLs Intravenous Stopped 01/06/20 1651)  morphine 4 MG/ML injection 4 mg (4 mg Intravenous Given 01/06/20 1405)  ondansetron (ZOFRAN) injection 4 mg (4 mg Intravenous Given 01/06/20 1405)  iohexol (OMNIPAQUE) 300 MG/ML solution 100 mL (100 mLs Intravenous Contrast Given 01/06/20 1422)    ED Course  I have reviewed the triage vital signs and the nursing notes.  Pertinent labs & imaging results that were available during my care of the patient were reviewed by me and considered in my medical decision making (see chart for details).  39 year old presents for evaluation nausea, vomiting, diarrhea and generalized abdominal pain onset 3 days ago.  Subjective fever at home however she is afebrile here, no tachycardia, tachypnea or hypoxia.  Her heart and lungs are clear.  Her abdomen has some generalized tenderness however no focal tenderness, no rebound or guarding.  Negative Murphy sign, bony point.  She has no pelvic pain, vaginal discharge.  Low suspicion for TOA, torsion, PID.  Patient does have multiple areas of old hidradenitis suppurativa scarring.  She does a 1 cm area of induration under left breast increase however no fluctuance, erythema, warmth.  No abscess to drain.  Plan on labs, imaging and reassess.  Labs and imaging personally reviewed and interpreted: CBC without leukocytosis Hgb 13.3 CMP Mild hyponatremia at 133, hypokalemia at 3.3 no additional electrolyte, renal or liver abnormalities Lipase 35 UA many bacteria, moderate blood on cycle CT AP without acute changes Preg negative COVID, Flu negative  Patient reassessed. Pain controlled. Tolerating PO intake without difficulty. Question viral gastroenteritis. Will dc home with symptomatic management.  She does have exam findings consistent with hidradenitis suppurativa.  Discussed conservative management of this.  No large abscess requiring drainage.  She is given appropriate follow-up.  Patient is  nontoxic, nonseptic appearing, in no apparent distress.  Patient's pain and other symptoms adequately managed in emergency department.  Fluid bolus given.  Labs, imaging and vitals reviewed.  Patient does not meet the SIRS or Sepsis criteria.  On repeat exam patient does not have a surgical abdomin and there are no peritoneal signs.  No indication of appendicitis, bowel obstruction, bowel perforation, cholecystitis, diverticulitis, STD, PID or ectopic pregnancy.  The patient has been appropriately medically screened and/or stabilized in the ED. I have low suspicion for any other emergent medical condition which would require further screening, evaluation or treatment in the ED or require inpatient management.  Patient is hemodynamically stable and in no acute distress.  Patient able to ambulate in department prior to ED.  Evaluation does not show acute pathology that would require ongoing or additional emergent interventions while in the emergency department or further inpatient treatment.  I have discussed the diagnosis with the patient and answered all questions.  Pain is been managed while in the emergency department and patient has no further complaints prior to discharge.  Patient is comfortable with plan discussed in room and is stable for discharge at this time.  I have discussed strict return precautions for returning to the emergency department.  Patient was encouraged to follow-up with PCP/specialist refer to at discharge.     MDM Rules/Calculators/A&P                           Final Clinical Impression(s) / ED Diagnoses Final diagnoses:  Nausea vomiting and diarrhea  Generalized abdominal pain  Hidradenitis suppurativa    Rx / DC Orders ED Discharge Orders         Ordered    ondansetron (ZOFRAN ODT) 4 MG disintegrating tablet  Every 8 hours PRN        01/06/20 1659    sulfamethoxazole-trimethoprim (BACTRIM DS) 800-160 MG tablet  2 times daily        01/06/20 1659    dicyclomine  (BENTYL) 20 MG tablet  2 times daily  01/06/20 1700           Tu Shimmel A, PA-C 01/06/20 1702    Virgina Norfolk, DO 01/07/20 0174

## 2020-01-06 NOTE — ED Triage Notes (Signed)
Pt reports abdominal pain, subjective fever with chills and vomiting x 3 days. No relief with Vicodin and zofran.

## 2020-01-06 NOTE — Discharge Instructions (Addendum)
Zofran for nausea  Bactrim for Hidradenitis Superativa  Bentyl for Abdominal pain  Return for new or worsening symptoms

## 2020-01-06 NOTE — ED Notes (Signed)
PO fluid given 

## 2020-01-07 LAB — URINE CULTURE: Culture: <10000 — AB

## 2020-01-09 ENCOUNTER — Ambulatory Visit: Payer: Self-pay | Admitting: *Deleted

## 2020-01-09 NOTE — Telephone Encounter (Signed)
Patient was seen at [facility] yesterday and treated for UTI with Bactrim. Patient reports within 7-8 hours of taking antibiotic- she has developed a burning rash on her R forearm with a blister present. Patient does not have PCP- call to ED to see if they want to see her back- she can come- but will not see same provider and if cost is issue- she may want to go to UC. Patient advised.  Reason for Disposition . [1] Caller has URGENT medicine question about med that PCP or specialist prescribed AND [2] triager unable to answer question  Answer Assessment - Initial Assessment Questions 1. NAME of MEDICATION: "What medicine are you calling about?"     Rash on arm- R forearm and upper inside arm 2. QUESTION: "What is your question?" (e.g., medication refill, side effect)     Could it be reaction 3. PRESCRIBING HCP: "Who prescribed it?" Reason: if prescribed by specialist, call should be referred to that group.     ED 4. SYMPTOMS: "Do you have any symptoms?"     Burning- stinging, one blister 5. SEVERITY: If symptoms are present, ask "Are they mild, moderate or severe?"     mild 6. PREGNANCY:  "Is there any chance that you are pregnant?" "When was your last menstrual period?"     No- LMP- present now  Protocols used: MEDICATION QUESTION CALL-A-AH

## 2020-01-27 MED FILL — MIRTAZAPINE 15 MG TABLET: 15 | 30 days supply | Qty: 30 | Fill #1

## 2020-01-27 MED FILL — ?BUSPIRONE HCL 15 MG TABLET: 15 | 30 days supply | Qty: 90 | Fill #1

## 2020-01-27 MED FILL — ?PRAZOSIN 2 MG CAPSULE: 2 | 30 days supply | Qty: 30 | Fill #1

## 2020-01-27 MED FILL — GABAPENTIN 400 MG CAPSULE: 400 | 30 days supply | Qty: 90 | Fill #1

## 2020-01-30 ENCOUNTER — Other Ambulatory Visit: Payer: Self-pay

## 2020-01-30 ENCOUNTER — Encounter (HOSPITAL_COMMUNITY): Payer: Self-pay | Admitting: Psychiatry

## 2020-01-30 ENCOUNTER — Telehealth (INDEPENDENT_AMBULATORY_CARE_PROVIDER_SITE_OTHER): Payer: No Payment, Other | Admitting: Psychiatry

## 2020-01-30 DIAGNOSIS — F4310 Post-traumatic stress disorder, unspecified: Secondary | ICD-10-CM

## 2020-01-30 DIAGNOSIS — F331 Major depressive disorder, recurrent, moderate: Secondary | ICD-10-CM

## 2020-01-30 DIAGNOSIS — F419 Anxiety disorder, unspecified: Secondary | ICD-10-CM | POA: Diagnosis not present

## 2020-01-30 DIAGNOSIS — F1421 Cocaine dependence, in remission: Secondary | ICD-10-CM

## 2020-01-30 DIAGNOSIS — F1121 Opioid dependence, in remission: Secondary | ICD-10-CM

## 2020-01-30 DIAGNOSIS — F1320 Sedative, hypnotic or anxiolytic dependence, uncomplicated: Secondary | ICD-10-CM

## 2020-01-30 MED ORDER — PRAZOSIN HCL 2 MG PO CAPS: 2.0000 mg | ORAL_CAPSULE | Freq: Every day | ORAL | 1 refills | Status: DC

## 2020-01-30 MED ORDER — BUSPIRONE HCL 15 MG PO TABS: 15.0000 mg | ORAL_TABLET | Freq: Three times a day (TID) | ORAL | 1 refills | Status: DC

## 2020-01-30 MED ORDER — MIRTAZAPINE 15 MG PO TABS: 15.0000 mg | ORAL_TABLET | Freq: Every day | ORAL | 1 refills | Status: DC

## 2020-01-30 MED ORDER — GABAPENTIN 400 MG PO CAPS
400.0000 mg | ORAL_CAPSULE | Freq: Three times a day (TID) | ORAL | 1 refills | Status: DC
Start: 2020-01-30 — End: 2020-03-23

## 2020-01-30 NOTE — Progress Notes (Signed)
BH OP Progress Note   Virtual Visit via Video Note  I connected with [name] on 01/30/20 at  8:20 AM EDT by a video enabled telemedicine application and verified that I am speaking with the correct person using two identifiers.  Location: Patient: Home Provider: Clinic   I discussed the limitations of evaluation and management by telemedicine and the availability of in person appointments. The patient expressed understanding and agreed to proceed.  I provided 16 minutes of non-face-to-face time during this encounter.    Patient Identification: [name] MRN:  [id number] Date of Evaluation:  01/30/2020   Referral Source: Vesta Mixer  Chief Complaint:   " I have a lot going on, I don't know where to start with."  Visit Diagnosis:    ICD-10-CM   1. MDD (major depressive disorder), recurrent episode, moderate (HCC)  F33.1   2. PTSD (post-traumatic stress disorder)  F43.10   3. Anxiety  F41.9   4. Opioid use disorder, moderate, in sustained remission (HCC)  F11.21   5. Benzodiazepine dependence, continuous (HCC)  F13.20   6. Cocaine use disorder, moderate, in sustained remission (HCC)  F14.21     History of Present Illness: Patient seen for follow-up after initial encounter 6 weeks ago.  Patient was restarted on her medications mirtazapine and prazosin at that time.  Today patient reported that she is stressed because of lot of reasons.  She did not elaborate the reasons however stated that she is under a lot of pressure and does not even know where to start from.  She feels overwhelmed because of all the stress.  She still living with her parents and still is working at [employer].  She stated that starting the medication did not make a big difference because of all the overwhelming stressors. Writer suggested that she continue the same regimen for now as she works through the stressors to which she replied does not think that is happening anytime soon. Of note,  patient was smoking a cigarette during the session.  Past Psychiatric History: MDD, PTSD, polysubstance abuse  Past Medical History:  Past Medical History:  Diagnosis Date  . Anxiety   . Benzodiazepine abuse (HCC)   . Bipolar 1 disorder (HCC)   . Depression   . Drug abuse (HCC)   . ETOH abuse   . Polysubstance abuse (HCC)    No past surgical history on file.  Family Psychiatric History: denied  Family History: No family history on file.  Social History:   Social History   Socioeconomic History  . Marital status: Single    Spouse name: Not on file  . Number of children: Not on file  . Years of education: Not on file  . Highest education level: Not on file  Occupational History  . Not on file  Tobacco Use  . Smoking status: Current Every Day Smoker    Packs/day: 2.00    Years: 17.00    Pack years: 34.00    Types: Cigarettes  . Smokeless tobacco: Never Used  Vaping Use  . Vaping Use: Never used  Substance and Sexual Activity  . Alcohol use: Yes    Comment: +63  . Drug use: Yes    Types: Marijuana, IV    Comment: UDS + THC  . Sexual activity: Never  Other Topics Concern  . Not on file  Social History Narrative  . Not on file   Social Determinants of Health   Financial Resource Strain:   .  Difficulty of Paying Living Expenses: Not on file  Food Insecurity:   . Worried About Programme researcher, broadcasting/film/video in the Last Year: Not on file  . Ran Out of Food in the Last Year: Not on file  Transportation Needs:   . Lack of Transportation (Medical): Not on file  . Lack of Transportation (Non-Medical): Not on file  Physical Activity:   . Days of Exercise per Week: Not on file  . Minutes of Exercise per Session: Not on file  Stress:   . Feeling of Stress : Not on file  Social Connections:   . Frequency of Communication with Friends and Family: Not on file  . Frequency of Social Gatherings with Friends and Family: Not on file  . Attends Religious Services: Not on file  .  Active Member of Clubs or Organizations: Not on file  . Attends Banker Meetings: Not on file  . Marital Status: Not on file     Allergies:   Allergies  Allergen Reactions  . Haldol [Haloperidol Lactate] Other (See Comments)    Makes whole body stiff    Metabolic Disorder Labs: Lab Results  Component Value Date   HGBA1C 5.2 02/24/2016   No results found for: PROLACTIN Lab Results  Component Value Date   TRIG 106 06/27/2017   Lab Results  Component Value Date   TSH 1.29 02/24/2016    Therapeutic Level Labs: No results found for: LITHIUM Lab Results  Component Value Date   CBMZ <2.0 (L) 11/21/2016   Lab Results  Component Value Date   VALPROATE 74.4 04/26/2010    Current Medications: Current Outpatient Medications  Medication Sig Dispense Refill  . ALPRAZolam (XANAX) 1 MG tablet Take 1 mg by mouth 3 (three) times daily as needed for anxiety.    . busPIRone (BUSPAR) 15 MG tablet Take 1 tablet (15 mg total) by mouth 3 (three) times daily. 90 tablet 1  . dicyclomine (BENTYL) 20 MG tablet Take 1 tablet (20 mg total) by mouth 2 (two) times daily. 20 tablet 0  . gabapentin (NEURONTIN) 400 MG capsule Take 1 capsule (400 mg total) by mouth 3 (three) times daily. 90 capsule 1  . mirtazapine (REMERON) 15 MG tablet Take 1 tablet (15 mg total) by mouth at bedtime. 30 tablet 1  . omeprazole (PRILOSEC OTC) 20 MG tablet Take 20 mg by mouth daily.    . ondansetron (ZOFRAN ODT) 4 MG disintegrating tablet Take 1 tablet (4 mg total) by mouth every 8 (eight) hours as needed for nausea or vomiting. 20 tablet 0  . prazosin (MINIPRESS) 2 MG capsule Take 1 capsule (2 mg total) by mouth at bedtime. 30 capsule 1   No current facility-administered medications for this visit.    Psychiatric Specialty Exam: Review of Systems  There were no vitals taken for this visit.There is no height or weight on file to calculate BMI.  General Appearance: Fairly Groomed  Eye Contact:  Good   Speech:  Clear and Coherent and Normal Rate  Volume:  Normal  Mood:  Depressed, irritable  Affect:  Congruent  Thought Process:  Goal Directed and Descriptions of Associations: Intact  Orientation:  Full (Time, Place, and Person)  Thought Content:  Logical  Suicidal Thoughts:  No  Homicidal Thoughts:  No  Memory:  Immediate;   Good Recent;   Good  Judgement:  Fair  Insight:  Fair  Psychomotor Activity:  Normal  Concentration:  Concentration: Good and Attention Span: Good  Recall:  Good  Fund of Knowledge:Good  Language: Good  Akathisia:  Negative  Handed:  Right  AIMS (if indicated):  Not done  Assets:  Communication Skills Desire for Improvement Financial Resources/Insurance Housing  ADL's:  Intact  Cognition: WNL  Sleep:  Fair   Screenings: AIMS     [facility]  AIMS Total Score 0    AUDIT     [facility]) [facility] Ad[facility] Ad[facility] (Dis[facility]  Alcohol Use Disorder Identification Test Final Score (AUDIT) 22 29 33    GAD-7     Office Visit from 06/05/2016 in [facility] Office Visit from 04/05/2016 in [facility] Office Visit from 02/24/2016 in [facility]  Total GAD-7 Score 11 9 10     PHQ2-9     Office Visit from 06/05/2016 in [facility] Office Visit from 04/05/2016 in [facility] Office Visit from 02/24/2016 in [facility]  PHQ-2 Total Score 2 2 2   PHQ-9 Total Score 9 9 6       Assessment and Plan: Patient reported ongoing stressors and stated that starting the medicines did not make a big difference.   Writer suggested that she continue the same regimen and try to work through her stressors one by one.  1. MDD (major depressive disorder), recurrent episode, moderate (HCC)  - prazosin (MINIPRESS) 2 MG capsule; Take 1 capsule (2 mg total) by mouth at bedtime.  Dispense: 30 capsule; Refill: 1 - mirtazapine (REMERON) 15 MG tablet; Take 1 tablet (15 mg total) by mouth at bedtime.  Dispense: 30 tablet; Refill: 1  2. Opioid use disorder, moderate, in sustained remission (HCC)   3. Benzodiazepine dependence, continuous (HCC)   4. Cocaine use disorder, moderate, in sustained remission (HCC)   5. Anxiety - busPIRone (BUSPAR) 15 MG tablet; Take 1 tablet (15 mg total) by mouth 3 (three) times daily.  Dispense: 90 tablet; Refill: 1  6. PTSD (post-traumatic stress disorder) - prazosin (MINIPRESS) 2 MG capsule; Take 1 capsule (2 mg total) by mouth at bedtime.  Dispense: 30 capsule; Refill: 1  Patient was encouraged to cut down on her Xanax use. Follow-up in 2 months.  [facility], MD 10/22/20218:33 AM

## 2020-03-23 ENCOUNTER — Telehealth (INDEPENDENT_AMBULATORY_CARE_PROVIDER_SITE_OTHER): Payer: No Payment, Other | Admitting: Psychiatry

## 2020-03-23 ENCOUNTER — Other Ambulatory Visit (HOSPITAL_COMMUNITY): Payer: Self-pay | Admitting: Psychiatry

## 2020-03-23 ENCOUNTER — Encounter (HOSPITAL_COMMUNITY): Payer: Self-pay | Admitting: Psychiatry

## 2020-03-23 ENCOUNTER — Other Ambulatory Visit: Payer: Self-pay

## 2020-03-23 DIAGNOSIS — F419 Anxiety disorder, unspecified: Secondary | ICD-10-CM

## 2020-03-23 DIAGNOSIS — F4310 Post-traumatic stress disorder, unspecified: Secondary | ICD-10-CM

## 2020-03-23 DIAGNOSIS — F331 Major depressive disorder, recurrent, moderate: Secondary | ICD-10-CM | POA: Diagnosis not present

## 2020-03-23 DIAGNOSIS — F1121 Opioid dependence, in remission: Secondary | ICD-10-CM | POA: Diagnosis not present

## 2020-03-23 DIAGNOSIS — F1421 Cocaine dependence, in remission: Secondary | ICD-10-CM

## 2020-03-23 DIAGNOSIS — F1320 Sedative, hypnotic or anxiolytic dependence, uncomplicated: Secondary | ICD-10-CM

## 2020-03-23 MED ORDER — GABAPENTIN 400 MG PO CAPS
400.0000 mg | ORAL_CAPSULE | Freq: Three times a day (TID) | ORAL | 1 refills | Status: DC
Start: 2020-03-23 — End: 2020-05-25

## 2020-03-23 MED ORDER — BUSPIRONE HCL 15 MG PO TABS: 15.0000 mg | ORAL_TABLET | Freq: Three times a day (TID) | ORAL | 1 refills | Status: DC

## 2020-03-23 MED ORDER — SERTRALINE HCL 50 MG PO TABS: 50.0000 mg | ORAL_TABLET | Freq: Every day | ORAL | 1 refills | Status: DC

## 2020-03-23 MED ORDER — PRAZOSIN HCL 2 MG PO CAPS: 2.0000 mg | ORAL_CAPSULE | Freq: Every day | ORAL | 1 refills | Status: DC

## 2020-03-23 MED FILL — ?BUSPIRONE HCL 15MG TABS: 15 | 30 days supply | Qty: 90 | Fill #0

## 2020-03-23 MED FILL — ?PRAZOSIN 2 MG CAPSULE: 2 | 30 days supply | Qty: 30 | Fill #0

## 2020-03-23 MED FILL — ?SERTRALINE HCL 50 MG TABS: 50 | 30 days supply | Qty: 30 | Fill #0

## 2020-03-23 MED FILL — GABAPENTIN 400 MG CAPSULE: 400 | 30 days supply | Qty: 90 | Fill #0

## 2020-03-23 NOTE — Progress Notes (Signed)
BH OP Progress Note   Virtual Visit via Video Note  I connected with [name] on 03/23/20 at  1:30 PM EST by a video enabled telemedicine application and verified that I am speaking with the correct person using two identifiers.  Location: Patient: Home Provider: Clinic   I discussed the limitations of evaluation and management by telemedicine and the availability of in person appointments. The patient expressed understanding and agreed to proceed.  I provided 18 minutes of non-face-to-face time during this encounter.    Patient Identification: [name] MRN:  [id number] Date of Evaluation:  03/23/2020   Chief Complaint:   "I have not been doing good. I don't think the medicine is working."   Visit Diagnosis:    ICD-10-CM   1. MDD (major depressive disorder), recurrent episode, moderate (HCC)  F33.1   2. PTSD (post-traumatic stress disorder)  F43.10   3. Anxiety  F41.9   4. Opioid use disorder, moderate, in sustained remission (HCC)  F11.21   5. Benzodiazepine dependence, continuous (HCC)  F13.20   6. Cocaine use disorder, moderate, in sustained remission (HCC)  F14.21     History of Present Illness: Patient was noted to be slightly irritable in the beginning of the session.  She stated that she has been depressed and has been going through a lot lately.  She did not elaborate everything however did mention that she just broke up with her girlfriend which is a lot for her.  She still is working at [employer] and still lives with her parents. She stated that she does not think any of her medicines are working.  She stated that the only medicine that has worked for her was Xanax however she knows that Clinical research associate would not prescribe it to her. She stated that sometimes she feels so depressed that she thinks that she will need to go to inpatient unit.  She stated that she also has been having some passive suicidal ideations however denied any active plans or  intent. She stated that she has a job that she cares for and she also has animals that she cannot leave by themselves.  Therefore she does not really want to go to the hospital and wants to avoid it as much as possible.  Writer reviewed her medication list from [place] from the EMR.  According to the EMR, she was being prescribed Zoloft in the past and she was also prescribed omeprazole.  Writer asked her as Zoloft helps her any better, she replied she thinks Zoloft worked better than Remeron and therefore would like to switch to it. Regarding Lamictal, she could not recall if it helped much. She stated that she has a lot of anxiety.  She has taken Vistaril in the past but did not find it to be helpful and thinks that buspirone works better compared to Vistaril but is not fully effective.  Writer explained to the patient that medications can only do so much and that if she has ongoing stressors then she will need to manage them before we keep adding medications to her regimen.  Patient stated that she agrees and understands this.  She then asked if she can be added to her therapist.  She also asked if the clinic offers any groups.  Writer answered her questions and informed her that we do offer counseling services and also have substance abuse IOP.  Patient stated that due to her work schedule she will can only turn the groups in the  IOP once a week and were not able to come 3 times a week.  Writer informed her that the Clinical research associatewriter can connect her with a therapist in the clinic and she can start individual therapy and also will switch her medication from mirtazapine to sertraline.  If she feels that this is helpful then we will be glad that however if she does not feel find it beneficial she can be referred to substance abuse IOP in the future.   Past Psychiatric History: MDD, PTSD, polysubstance abuse  Past Medical History:  Past Medical History:  Diagnosis Date  . Anxiety   . Benzodiazepine abuse  (HCC)   . Bipolar 1 disorder (HCC)   . Depression   . Drug abuse (HCC)   . ETOH abuse   . Polysubstance abuse (HCC)    History reviewed. No pertinent surgical history.  Family Psychiatric History: denied  Family History: History reviewed. No pertinent family history.  Social History:   Social History   Socioeconomic History  . Marital status: Single    Spouse name: Not on file  . Number of children: Not on file  . Years of education: Not on file  . Highest education level: Not on file  Occupational History  . Not on file  Tobacco Use  . Smoking status: Current Every Day Smoker    Packs/day: 2.00    Years: 17.00    Pack years: 34.00    Types: Cigarettes  . Smokeless tobacco: Never Used  Vaping Use  . Vaping Use: Never used  Substance and Sexual Activity  . Alcohol use: Yes    Comment: +63  . Drug use: Yes    Types: Marijuana, IV    Comment: UDS + THC  . Sexual activity: Never  Other Topics Concern  . Not on file  Social History Narrative  . Not on file   Social Determinants of Health   Financial Resource Strain: Not on file  Food Insecurity: Not on file  Transportation Needs: Not on file  Physical Activity: Not on file  Stress: Not on file  Social Connections: Not on file     Allergies:   Allergies  Allergen Reactions  . Haldol [Haloperidol Lactate] Other (See Comments)    Makes whole body stiff    Metabolic Disorder Labs: Lab Results  Component Value Date   HGBA1C 5.2 02/24/2016   No results found for: PROLACTIN Lab Results  Component Value Date   TRIG 106 06/27/2017   Lab Results  Component Value Date   TSH 1.29 02/24/2016    Therapeutic Level Labs: No results found for: LITHIUM Lab Results  Component Value Date   CBMZ <2.0 (L) 11/21/2016   Lab Results  Component Value Date   VALPROATE 74.4 04/26/2010    Current Medications: Current Outpatient Medications  Medication Sig Dispense Refill  . ALPRAZolam (XANAX) 1 MG tablet Take  1 mg by mouth 3 (three) times daily as needed for anxiety.    . busPIRone (BUSPAR) 15 MG tablet Take 1 tablet (15 mg total) by mouth 3 (three) times daily. 90 tablet 1  . gabapentin (NEURONTIN) 400 MG capsule Take 1 capsule (400 mg total) by mouth 3 (three) times daily. 90 capsule 1  . omeprazole (PRILOSEC OTC) 20 MG tablet Take 20 mg by mouth daily.    . ondansetron (ZOFRAN ODT) 4 MG disintegrating tablet Take 1 tablet (4 mg total) by mouth every 8 (eight) hours as needed for nausea or vomiting. 20 tablet 0  .  prazosin (MINIPRESS) 2 MG capsule Take 1 capsule (2 mg total) by mouth at bedtime. 30 capsule 1   No current facility-administered medications for this visit.    Psychiatric Specialty Exam: Review of Systems  There were no vitals taken for this visit.There is no height or weight on file to calculate BMI.  General Appearance: Fairly Groomed  Eye Contact:  Good  Speech:  Clear and Coherent and Normal Rate  Volume:  Normal  Mood:  Depressed, irritable  Affect:  Congruent  Thought Process:  Goal Directed and Descriptions of Associations: Intact  Orientation:  Full (Time, Place, and Person)  Thought Content:  Logical  Suicidal Thoughts:  No  Homicidal Thoughts:  No  Memory:  Immediate;   Good Recent;   Good  Judgement:  Fair  Insight:  Fair  Psychomotor Activity:  Normal  Concentration:  Concentration: Good and Attention Span: Good  Recall:  Good  Fund of Knowledge:Good  Language: Good  Akathisia:  Negative  Handed:  Right  AIMS (if indicated):  Not done  Assets:  Communication Skills Desire for Improvement Financial Resources/Insurance Housing  ADL's:  Intact  Cognition: WNL  Sleep:  Fair   Screenings: AIMS   Flowsheet Row [facility]  AIMS Total Score 0    AUDIT   Flowsheet Row Ad[facility]) [facility] Admission (Discharged)  [facility] Ad[facility] (Discharged[facility]  Alcohol Use Disorder Identification Test Final Score (AUDIT) 22 29 33    GAD-7   Flowsheet Row [facility] Visit from [facility]/05/2016 in [facility] Office Visit from 04/05/2016 in [facility] Office Visit from 02/24/2016 in [facility]  Total GAD-7 Score 11 9 10     PHQ2-9   Flowsheet Row Office Visit from 06/05/2016 in [facility] Office Visit from 04/05/2016 in [facility] Office Visit from 02/24/2016 in [facility]  PHQ-2 Total Score 2 2 2   PHQ-9 Total Score 9 9 6       Assessment and Plan: Patient continues to report several stressors and continues to complain of anxiety and depression.  She does not think mirtazapine is helping and is agreeable to switching back to sertraline as she took it in the past and had a fair response.  She would like to continue buspirone for anxiety as she did not find Vistaril to be helpful in the past.  She is interested in being connected to a therapist.  1. MDD (major depressive disorder), recurrent episode, moderate (HCC)  - prazosin (MINIPRESS) 2 MG capsule; Take 1 capsule (2 mg total) by mouth at bedtime.  Dispense: 30 capsule; Refill: 1 - sertraline (ZOLOFT) 50 MG tablet; Take 1 tablet (50 mg total) by mouth daily.  Dispense: 30 tablet; Refill: 1 - Discontinue Mirtazapine due to lack of efficacy.   2. PTSD (post-traumatic stress disorder)  - prazosin (MINIPRESS) 2 MG capsule; Take 1 capsule (2 mg total) by mouth at bedtime.  Dispense: 30 capsule; Refill: 1 - sertraline (ZOLOFT) 50 MG tablet; Take 1 tablet (50 mg total) by mouth daily.  Dispense: 30 tablet; Refill: 1  3. Anxiety  - busPIRone (BUSPAR) 15 MG tablet; Take 1 tablet (15 mg  total) by mouth 3 (three) times daily.  Dispense: 90 tablet; Refill: 1 - sertraline (ZOLOFT) 50 MG tablet; Take 1 tablet (50 mg total) by mouth daily.  Dispense: 30 tablet; Refill: 1  4. Opioid use disorder, moderate, in sustained remission (HCC) - Continue Gabapentin 400 mg TID.  5. Benzodiazepine dependence, continuous (HCC)   6. Cocaine use disorder, moderate, in sustained remission [facility])    Patient was encouraged to cut down on her Xanax use. Refer for individual therapy. Follow-up in 2 months.  [name], MD 12/14/20212:16 PM

## 2020-03-30 MED FILL — ?BUSPIRONE HCL 15MG TABS: 15 | 30 days supply | Qty: 90 | Fill #0

## 2020-03-30 MED FILL — ?PRAZOSIN 2 MG CAPSULE: 2 | 30 days supply | Qty: 30 | Fill #0

## 2020-03-30 MED FILL — GABAPENTIN 400 MG CAPSULE: 400 | 30 days supply | Qty: 90 | Fill #0

## 2020-03-30 MED FILL — ?SERTRALINE HCL 50 MG TABS: 50 | 30 days supply | Qty: 30 | Fill #0

## 2020-04-13 ENCOUNTER — Other Ambulatory Visit: Payer: Self-pay

## 2020-04-13 ENCOUNTER — Ambulatory Visit (HOSPITAL_COMMUNITY): Payer: No Payment, Other | Admitting: Behavioral Health

## 2020-04-20 ENCOUNTER — Ambulatory Visit (INDEPENDENT_AMBULATORY_CARE_PROVIDER_SITE_OTHER): Payer: No Payment, Other | Admitting: Behavioral Health

## 2020-04-20 ENCOUNTER — Other Ambulatory Visit: Payer: Self-pay

## 2020-04-20 DIAGNOSIS — F1320 Sedative, hypnotic or anxiolytic dependence, uncomplicated: Secondary | ICD-10-CM | POA: Diagnosis not present

## 2020-04-21 NOTE — Progress Notes (Signed)
   THERAPIST PROGRESS NOTE  Session Time: 10:00AM  Participation Level: Active  Behavioral Response: Neat and Well GroomedAlertPleasant  Type of Therapy: Individual Therapy  Treatment Goals addressed: Diagnosis: MDD  Interventions: CBT  Summary: [name] is a 40 y.o. female who presents to [facility] for a scheduled individual therapy session. Clt checked-in by reporting that she rates her anxiety at a 3-4, on a scale 0 to 10 with 10 being the worst. She talked about her recurrent relapse with Xanax due to not knowing how to manage her anxiety symptoms any other way. She shared that she is unable to find a provider that will prescribe her Xanax; therefore, clt reported to this writer that she is getting this medication illicitly. Clt said she would "think about" the option of going to detox. Clt was alert and oriented while pleasant in her mood.  Suicidal/Homicidal: No  Therapist Response: Therapist offered encouragement and support. Therapist discussed stress reduction and grounding exercises with clt to assist with decreasing anxiety symptoms. Therapist discussed detox as a treatment option. Therapist discussed the dangers of detoxing off benzodiazepines at home alone.   Plan: Return again in 1 week.  Diagnosis: Axis I: Major Depression, Recurrent severe    Axis II: No diagnosis    Mamie Nick, Counselor 04/21/2020

## 2020-05-04 ENCOUNTER — Ambulatory Visit (HOSPITAL_COMMUNITY): Payer: No Payment, Other | Admitting: Behavioral Health

## 2020-05-04 ENCOUNTER — Ambulatory Visit (HOSPITAL_COMMUNITY): Payer: No Payment, Other

## 2020-05-21 MED FILL — GABAPENTIN 400 MG CAPSULE: 400 | 30 days supply | Qty: 90 | Fill #1

## 2020-05-21 MED FILL — ?PRAZOSIN 2 MG CAPSULE: 2 | 30 days supply | Qty: 30 | Fill #1

## 2020-05-21 MED FILL — ?SERTRALINE HCL 50 MG TABS: 50 | 30 days supply | Qty: 30 | Fill #1

## 2020-05-21 MED FILL — ?BUSPIRONE HCL 15MG TABS: 15 | 30 days supply | Qty: 90 | Fill #1

## 2020-05-25 ENCOUNTER — Telehealth (INDEPENDENT_AMBULATORY_CARE_PROVIDER_SITE_OTHER): Payer: No Payment, Other | Admitting: Psychiatry

## 2020-05-25 ENCOUNTER — Other Ambulatory Visit: Payer: Self-pay

## 2020-05-25 ENCOUNTER — Other Ambulatory Visit (HOSPITAL_COMMUNITY): Payer: Self-pay | Admitting: Psychiatry

## 2020-05-25 ENCOUNTER — Encounter (HOSPITAL_COMMUNITY): Payer: Self-pay | Admitting: Psychiatry

## 2020-05-25 DIAGNOSIS — F1421 Cocaine dependence, in remission: Secondary | ICD-10-CM

## 2020-05-25 DIAGNOSIS — F1121 Opioid dependence, in remission: Secondary | ICD-10-CM

## 2020-05-25 DIAGNOSIS — F3341 Major depressive disorder, recurrent, in partial remission: Secondary | ICD-10-CM

## 2020-05-25 DIAGNOSIS — F1320 Sedative, hypnotic or anxiolytic dependence, uncomplicated: Secondary | ICD-10-CM

## 2020-05-25 DIAGNOSIS — F4310 Post-traumatic stress disorder, unspecified: Secondary | ICD-10-CM

## 2020-05-25 DIAGNOSIS — F419 Anxiety disorder, unspecified: Secondary | ICD-10-CM

## 2020-05-25 MED ORDER — GABAPENTIN 400 MG PO CAPS: 400.0000 mg | ORAL_CAPSULE | Freq: Three times a day (TID) | ORAL | 1 refills | Status: DC

## 2020-05-25 MED ORDER — SERTRALINE HCL 50 MG PO TABS: 50.0000 mg | ORAL_TABLET | Freq: Every day | ORAL | 1 refills | Status: DC

## 2020-05-25 MED ORDER — PRAZOSIN HCL 2 MG PO CAPS: 2.0000 mg | ORAL_CAPSULE | Freq: Every day | ORAL | 1 refills | Status: DC

## 2020-05-25 MED ORDER — BUSPIRONE HCL 15 MG PO TABS: 15.0000 mg | ORAL_TABLET | Freq: Three times a day (TID) | ORAL | 1 refills | Status: DC

## 2020-05-25 NOTE — Progress Notes (Signed)
[facility] Progress Note   Virtual Visit via Video Note  I connected with [name] on 05/25/20 at  3:00 PM EST by a video enabled telemedicine application and verified that I am speaking with the correct person using two identifiers.  Location: Patient: Home Provider: Clinic   I discussed the limitations of evaluation and management by telemedicine and the availability of in person appointments. The patient expressed understanding and agreed to proceed.  I provided 16 minutes of non-face-to-face time during this encounter.     Patient Identification: [name] MRN:  [id number] Date of Evaluation:  05/25/2020   Chief Complaint:   "I am doing better than before."   Visit Diagnosis:    ICD-10-CM   1. MDD (major depressive disorder), recurrent episode, moderate (HCC)  F33.1   2. PTSD (post-traumatic stress disorder)  F43.10   3. Anxiety  F41.9   4. Opioid use disorder, moderate, in sustained remission (HCC)  F11.21   5. Benzodiazepine dependence, continuous (HCC)  F13.20   6. Cocaine use disorder, moderate, in sustained remission (HCC)  F14.21     History of Present Illness: Patient reported that she feels she is doing better than before.  She stated that she thinks that sertraline has been helping her mood and she is not as irritable and depressed as she was.  She feels less anxious.  She also reported that she has been staying very busy with work which is doing well for her.  She informed that she recently got promoted at her new job and she is now being trained to get more responsibilities. She stated that she feels that things are progressing well for her. Writer noted that she met with the therapist Ms. Spease and is scheduled to start substance abuse intensive outpatient program from next week. Patient stated that she is going to start the IOP next week and she feels optimistic about it. She stated that she needs to change because she has been going on like  this for a long time now.  She is feeling confident and has cut down her illicit substance abuse consumption in the past few weeks. Writer applauded her for her positive thinking and the changes that she is making her routine for a better  future.   Past Psychiatric History: MDD, PTSD, polysubstance abuse  Past Medical History:  Past Medical History:  Diagnosis Date  . Anxiety   . Benzodiazepine abuse ([place])   . Bipolar 1 disorder ([place])   . Depression   . Drug abuse ([place])   . ETOH abuse   . Polysubstance abuse ([place])    No past surgical history on file.  Family Psychiatric History: denied  Family History: No family history on file.  Social History:   Social History   Socioeconomic History  . Marital status: Single    Spouse name: Not on file  . Number of children: Not on file  . Years of education: Not on file  . Highest education level: Not on file  Occupational History  . Not on file  Tobacco Use  . Smoking status: Current Every Day Smoker    Packs/day: 2.00    Years: 17.00    Pack years: 34.00    Types: Cigarettes  . Smokeless tobacco: Never Used  Vaping Use  . Vaping Use: Never used  Substance and Sexual Activity  . Alcohol use: Yes    Comment: +63  . Drug use: Yes    Types: Marijuana, IV  Comment: UDS + THC  . Sexual activity: Never  Other Topics Concern  . Not on file  Social History Narrative  . Not on file   Social Determinants of Health   Financial Resource Strain: Not on file  Food Insecurity: Not on file  Transportation Needs: Not on file  Physical Activity: Not on file  Stress: Not on file  Social Connections: Not on file     Allergies:   Allergies  Allergen Reactions  . Haldol [Haloperidol Lactate] Other (See Comments)    Makes whole body stiff    Metabolic Disorder Labs: Lab Results  Component Value Date   HGBA1C 5.2 02/24/2016   No results found for: PROLACTIN Lab Results  Component Value Date   TRIG 106 06/27/2017   Lab  Results  Component Value Date   TSH 1.29 02/24/2016    Therapeutic Level Labs: No results found for: LITHIUM Lab Results  Component Value Date   CBMZ <2.0 (L) 11/21/2016   Lab Results  Component Value Date   VALPROATE 74.4 04/26/2010    Current Medications: Current Outpatient Medications  Medication Sig Dispense Refill  . ALPRAZolam (XANAX) 1 MG tablet Take 1 mg by mouth 3 (three) times daily as needed for anxiety.    . busPIRone (BUSPAR) 15 MG tablet Take 1 tablet (15 mg total) by mouth 3 (three) times daily. 90 tablet 1  . gabapentin (NEURONTIN) 400 MG capsule Take 1 capsule (400 mg total) by mouth 3 (three) times daily. 90 capsule 1  . omeprazole (PRILOSEC OTC) 20 MG tablet Take 20 mg by mouth daily.    . ondansetron (ZOFRAN ODT) 4 MG disintegrating tablet Take 1 tablet (4 mg total) by mouth every 8 (eight) hours as needed for nausea or vomiting. 20 tablet 0  . prazosin (MINIPRESS) 2 MG capsule Take 1 capsule (2 mg total) by mouth at bedtime. 30 capsule 1  . sertraline (ZOLOFT) 50 MG tablet Take 1 tablet (50 mg total) by mouth daily. 30 tablet 1   No current facility-administered medications for this visit.    Psychiatric Specialty Exam: Review of Systems  There were no vitals taken for this visit.There is no height or weight on file to calculate BMI.  General Appearance: Fairly Groomed  Eye Contact:  Good  Speech:  Clear and Coherent and Normal Rate  Volume:  Normal  Mood: Less Depressed  Affect:  Congruent  Thought Process:  Goal Directed and Descriptions of Associations: Intact  Orientation:  Full (Time, Place, and Person)  Thought Content:  Logical  Suicidal Thoughts:  No  Homicidal Thoughts:  No  Memory:  Immediate;   Good Recent;   Good  Judgement:  Fair  Insight:  Fair  Psychomotor Activity:  Normal  Concentration:  Concentration: Good and Attention Span: Good  Recall:  Good  Fund of Knowledge:Good  Language: Good  Akathisia:  Negative  Handed:  Right   AIMS (if indicated):  Not done  Assets:  Communication Skills Desire for Improvement Financial Resources/Insurance Housing  ADL's:  Intact  Cognition: WNL  Sleep:  Fair   Screenings: AIMS   Flowsheet Row Admission (Discharged) from 06/03/2015 in [facility]  AIMS Total Score 0    AUDIT   Flowsheet Row Admission (Discharged) from 06/03/2015 in [facility] Admission (Discharged) from 11/01/2013 in [facility] Admission (Discharged) from 02/20/2013 in [facility]  Alcohol Use Disorder Identification Test  Final Score (AUDIT) 22 29 33    GAD-7   Flowsheet Row Office Visit from 06/05/2016 in [facility] Office Visit from 04/05/2016 in [facility] Office Visit from 02/24/2016 in [facility]  Total GAD-7 Score 11 9 10     PHQ2-9   [facility] Office Visit from 06/05/2016 in [facility] Office Visit from 04/05/2016 in [facility] Office Visit from 02/24/2016 in [facility]  PHQ-2 Total Score 2 2 2   PHQ-9 Total Score 9 9 6     Flowsheet Row ED from 08/13/2019 in [facility] DEPT ED from 06/30/2018 in [facility] CATEGORY High Risk Low Risk      Assessment and Plan: Patient appears to be doing well.  She reported improvement in her depressive symptoms and also anxiety symptoms after being started on sertraline.   1. Recurrent major depressive disorder, in partial remission (HCC)  - prazosin (MINIPRESS) 2 MG capsule; Take 1 capsule (2 mg total) by mouth at bedtime.  Dispense: 30 capsule; Refill: 1 - sertraline (ZOLOFT) 50 MG tablet; Take 1 tablet (50 mg total) by mouth daily.  Dispense: 30 tablet; Refill:  1  2. PTSD (post-traumatic stress disorder)  - prazosin (MINIPRESS) 2 MG capsule; Take 1 capsule (2 mg total) by mouth at bedtime.  Dispense: 30 capsule; Refill: 1 - sertraline (ZOLOFT) 50 MG tablet; Take 1 tablet (50 mg total) by mouth daily.  Dispense: 30 tablet; Refill: 1  3. Anxiety  - busPIRone (BUSPAR) 15 MG tablet; Take 1 tablet (15 mg total) by mouth 3 (three) times daily.  Dispense: 90 tablet; Refill: 1 - sertraline (ZOLOFT) 50 MG tablet; Take 1 tablet (50 mg total) by mouth daily.  Dispense: 30 tablet; Refill: 1  4. Opioid use disorder, moderate, in sustained remission (HCC)  - gabapentin (NEURONTIN) 400 MG capsule; Take 1 capsule (400 mg total) by mouth 3 (three) times daily.  Dispense: 90 capsule; Refill: 1  5. Benzodiazepine dependence, continuous ([place])   6. Cocaine use disorder, moderate, in sustained remission ([place])   Patient was advised to gradually cut down her Xanax, cocaine and opioid intake. Patient is scheduled to start substance abuse intensive outpatient program starting next week. Follow-up in 2 months.  Nevada Crane, MD 2/15/20222:59 PM

## 2020-06-01 ENCOUNTER — Ambulatory Visit (HOSPITAL_COMMUNITY): Payer: No Payment, Other | Admitting: Behavioral Health

## 2020-06-02 ENCOUNTER — Emergency Department (HOSPITAL_BASED_OUTPATIENT_CLINIC_OR_DEPARTMENT_OTHER)
Admission: EM | Admit: 2020-06-02 | Discharge: 2020-06-02 | Disposition: A | Discharge status: Home / Self Care | Payer: Self-pay | Attending: Emergency Medicine | Admitting: Emergency Medicine

## 2020-06-02 ENCOUNTER — Other Ambulatory Visit (HOSPITAL_COMMUNITY): Payer: Self-pay | Admitting: Emergency Medicine

## 2020-06-02 ENCOUNTER — Other Ambulatory Visit: Payer: Self-pay

## 2020-06-02 DIAGNOSIS — R112 Nausea with vomiting, unspecified: Secondary | ICD-10-CM | POA: Insufficient documentation

## 2020-06-02 DIAGNOSIS — R197 Diarrhea, unspecified: Secondary | ICD-10-CM | POA: Insufficient documentation

## 2020-06-02 DIAGNOSIS — R1084 Generalized abdominal pain: Secondary | ICD-10-CM | POA: Insufficient documentation

## 2020-06-02 DIAGNOSIS — F17210 Nicotine dependence, cigarettes, uncomplicated: Secondary | ICD-10-CM | POA: Insufficient documentation

## 2020-06-02 LAB — COMPREHENSIVE METABOLIC PANEL
ALT: 17 U/L (ref 0–44)
AST: 20 U/L (ref 15–41)
Albumin: 4.5 g/dL (ref 3.5–5.0)
Alkaline Phosphatase: 39 U/L (ref 38–126)
Anion gap: 12 (ref 5–15)
BUN: 14 mg/dL (ref 6–20)
CO2: 20 mmol/L — ABNORMAL LOW (ref 22–32)
Calcium: 9.3 mg/dL (ref 8.9–10.3)
Chloride: 106 mmol/L (ref 98–111)
Creatinine, Ser: 0.51 mg/dL (ref 0.44–1.00)
GFR, Estimated: >60 mL/min (ref >60)
Glucose, Bld: 146 mg/dL — ABNORMAL HIGH (ref 70–99)
Potassium: 3.1 mmol/L — ABNORMAL LOW (ref 3.5–5.1)
Sodium: 138 mmol/L (ref 135–145)
Total Bilirubin: 0.6 mg/dL (ref 0.3–1.2)
Total Protein: 7.4 g/dL (ref 6.5–8.1)

## 2020-06-02 LAB — CBC WITH DIFFERENTIAL/PLATELET
Abs Immature Granulocytes: 0.03 10*3/uL (ref 0.00–0.07)
Basophils Absolute: 0 10*3/uL (ref 0.0–0.1)
Basophils Relative: 0 %
Eosinophils Absolute: 0 10*3/uL (ref 0.0–0.5)
Eosinophils Relative: 0 %
HCT: 36.3 % (ref 36.0–46.0)
Hemoglobin: 12.5 g/dL (ref 12.0–15.0)
Immature Granulocytes: 0 %
Lymphocytes Relative: 11 %
Lymphs Abs: 0.8 10*3/uL (ref 0.7–4.0)
MCH: 34.3 pg — ABNORMAL HIGH (ref 26.0–34.0)
MCHC: 34.4 g/dL (ref 30.0–36.0)
MCV: 99.7 fL (ref 80.0–100.0)
Monocytes Absolute: 0.2 10*3/uL (ref 0.1–1.0)
Monocytes Relative: 3 %
Neutro Abs: 6.8 10*3/uL (ref 1.7–7.7)
Neutrophils Relative %: 86 %
Platelets: 281 10*3/uL (ref 150–400)
RBC: 3.64 MIL/uL — ABNORMAL LOW (ref 3.87–5.11)
RDW: 11.8 % (ref 11.5–15.5)
WBC: 7.9 10*3/uL (ref 4.0–10.5)
nRBC: 0 % (ref 0.0–0.2)

## 2020-06-02 LAB — PREGNANCY, URINE: Preg Test, Ur: NEGATIVE

## 2020-06-02 LAB — URINALYSIS, ROUTINE W REFLEX MICROSCOPIC
Bilirubin Urine: NEGATIVE
Glucose, UA: NEGATIVE mg/dL
Ketones, ur: 80 mg/dL — AB
Leukocytes,Ua: NEGATIVE
Nitrite: NEGATIVE
Protein, ur: 30 mg/dL — AB
Specific Gravity, Urine: 1.01 (ref 1.005–1.030)
pH: 8.5 — ABNORMAL HIGH (ref 5.0–8.0)

## 2020-06-02 LAB — URINALYSIS, MICROSCOPIC (REFLEX)

## 2020-06-02 LAB — LIPASE, BLOOD: Lipase: 26 U/L (ref 11–51)

## 2020-06-02 MED ORDER — DICYCLOMINE HCL 20 MG PO TABS: 20.0000 mg | ORAL_TABLET | Freq: Two times a day (BID) | ORAL | 0 refills | Status: DC

## 2020-06-02 MED ORDER — DICYCLOMINE HCL 10 MG/ML IM SOLN
20.0000 mg | Freq: Once | INTRAMUSCULAR | Status: AC
  Administered 2020-06-02: 20 mg via INTRAMUSCULAR
  Filled 2020-06-02: qty 2

## 2020-06-02 MED ORDER — LACTATED RINGERS IV BOLUS
1000.0000 mL | Freq: Once | INTRAVENOUS | Status: AC
  Administered 2020-06-02: 1000 mL via INTRAVENOUS

## 2020-06-02 MED ORDER — POTASSIUM CHLORIDE 10 MEQ/100ML IV SOLN
10.0000 meq | Freq: Once | INTRAVENOUS | Status: AC
  Administered 2020-06-02: 10 meq via INTRAVENOUS
  Filled 2020-06-02: qty 100

## 2020-06-02 MED ORDER — METOCLOPRAMIDE HCL 5 MG/ML IJ SOLN
10.0000 mg | Freq: Once | INTRAMUSCULAR | Status: AC
  Administered 2020-06-02: 10 mg via INTRAVENOUS
  Filled 2020-06-02: qty 2

## 2020-06-02 MED ORDER — PANTOPRAZOLE SODIUM 40 MG IV SOLR
40.0000 mg | Freq: Once | INTRAVENOUS | Status: AC
  Administered 2020-06-02: 40 mg via INTRAVENOUS
  Filled 2020-06-02: qty 40

## 2020-06-02 MED ORDER — MORPHINE SULFATE (PF) 4 MG/ML IV SOLN: 4.0000 mg | Freq: Once | INTRAVENOUS | Status: DC

## 2020-06-02 MED ORDER — PROMETHAZINE HCL 25 MG/ML IJ SOLN
25.0000 mg | Freq: Once | INTRAMUSCULAR | Status: AC
  Administered 2020-06-02: 25 mg via INTRAVENOUS
  Filled 2020-06-02: qty 1

## 2020-06-02 MED ORDER — PROMETHAZINE HCL 25 MG PO TABS: 25.0000 mg | ORAL_TABLET | Freq: Four times a day (QID) | ORAL | 0 refills | Status: DC | PRN

## 2020-06-02 MED FILL — PROMETHAZINE 25 MG TABLET: 25 | 7 days supply | Qty: 30 | Fill #0

## 2020-06-02 MED FILL — DICYCLOMINE 20 MG TABLET: 20 | 10 days supply | Qty: 20 | Fill #0

## 2020-06-02 NOTE — ED Provider Notes (Signed)
[facility] EMERGENCY DEPARTMENT Provider Note   CSN: [id number] Arrival date & time: 06/02/20  0847     History Chief Complaint  Patient presents with  . Abdominal Pain    [name] is a 40 y.o. female.  Patient is a 40 year old female with a history of depression, bipolar disease, benzodiazepine abuse, alcohol abuse, opiate use disorder in remission who is presenting today with complaints of generalized abdominal pain, nausea, vomiting and diarrhea that started last night.  Patient reports she has had numerous episodes of emesis and of diarrhea.  No blood in either.  She has had chills but has not checked her temperature.  She denies any cough, shortness of breath.  This all started after eating dinner last night.  No known sick contacts.  Patient does use tobacco and marijuana.  She reports she smokes marijuana the stays fairly seldomly.  She does still abuse Xanax and has had some recently and does not feel like she should be going through withdrawal.  She is had no prior abdominal surgeries and LMP was within the last 1 month.  The history is provided by the patient.  Abdominal Pain Pain location:  Epigastric and periumbilical Pain quality: aching and cramping        Past Medical History:  Diagnosis Date  . Anxiety   . Benzodiazepine abuse (HCC)   . Bipolar 1 disorder (HCC)   . Depression   . Drug abuse (HCC)   . ETOH abuse   . Polysubstance abuse [facility])     Patient Active Problem List   Diagnosis Date Noted  . Opioid use disorder, moderate, in sustained remission (HCC) 01/30/2020  . MDD (major depressive disorder), recurrent episode, moderate (HCC) 12/05/2019  . Cocaine use disorder, moderate, in sustained remission (HCC) 12/05/2019  . Anxiety 12/05/2019  . PTSD (post-traumatic stress disorder) 12/05/2019  . Family discord 08/13/2019  . Drug overdose 06/27/2017  . Intermittent explosive disorder 11/22/2016  . Chronic midline low back pain  05/29/2016  . Substance induced mood disorder (HCC) 06/28/2015  . 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 Polysubstance dependence (HCC) 06/06/2015  . Polysubstance dependence including opioid type drug, episodic abuse (HCC) 06/03/2015  . Polysubstance abuse (HCC) 11/01/2013  . Benzodiazepine dependence, continuous (HCC) 11/01/2013  . Bipolar I disorder, most recent episode (or current) manic (HCC) 02/25/2013  . Substance abuse/dependence 02/21/2013    No past surgical history on file.   OB History   No obstetric history on file.     No family history on file.  Social History   Tobacco Use  . Smoking status: Current Every Day Smoker    Packs/day: 2.00    Years: 17.00    Pack years: 34.00    Types: Cigarettes  . Smokeless tobacco: Never Used  Vaping Use  . Vaping Use: Never used  Substance Use Topics  . Alcohol use: Yes    Comment: +63  . Drug use: Yes    Types: Marijuana, IV    Comment: UDS + THC    Home Medications Prior to Admission medications   Medication Sig Start Date End Date Taking? Authorizing Provider  ALPRAZolam Prudy Feeler) 1 MG tablet Take 1 mg by mouth 3 (three) times daily as needed for anxiety.   Yes [provider]  busPIRone (BUSPAR) 15 MG tablet Take 1 tablet (15 mg total) by mouth 3 (three) times daily. 05/25/20  Yes [name], MD  gabapentin (NEURONTIN) 400 MG capsule Take 1 capsule (  400 mg total) by mouth 3 (three) times daily. 05/25/20  Yes [name], MD  omeprazole (PRILOSEC OTC) 20 MG tablet Take 20 mg by mouth daily.   Yes [provider]  ondansetron (ZOFRAN ODT) 4 MG disintegrating tablet Take 1 tablet (4 mg total) by mouth every 8 (eight) hours as needed for nausea or vomiting. 01/06/20  Yes Henderly, Britni A, PA-C  prazosin (MINIPRESS) 2 MG capsule Take 1 capsule (2 mg total) by mouth at bedtime. 05/25/20  Yes [name], MD  sertraline (ZOLOFT) 50 MG tablet Take 1 tablet (50 mg total) by mouth  daily. 05/25/20  Yes [name], MD    Allergies    Haldol [haloperidol lactate]  Review of Systems   Review of Systems  Gastrointestinal: Positive for abdominal pain.  All other systems reviewed and are negative.   Physical Exam Updated Vital Signs BP (!) 155/76 (BP Location: Right Arm)   Pulse 72   Temp (!) 97.5 F (36.4 C) (Oral)   Resp 19   Ht 5\' 2"  (1.575 m)   Wt 59.9 kg   LMP 05/06/2020 (Approximate)   SpO2 100%   BMI 24.14 kg/m   Physical Exam Vitals and nursing note reviewed.  Constitutional:      General: She is not in acute distress.    Appearance: She is well-developed and well-nourished.     Comments: Appears uncomfortable curled up in the bed in the fetal position  HENT:     Head: Normocephalic and atraumatic.     Mouth/Throat:     Comments: Dry mucous membranes Eyes:     Extraocular Movements: EOM normal.     Pupils: Pupils are equal, round, and reactive to light.  Cardiovascular:     Rate and Rhythm: Normal rate and regular rhythm.     Pulses: Intact distal pulses.     Heart sounds: Normal heart sounds. No murmur heard. No friction rub.  Pulmonary:     Effort: Pulmonary effort is normal.     Breath sounds: Normal breath sounds. No wheezing or rales.  Abdominal:     General: Bowel sounds are normal. There is no distension.     Palpations: Abdomen is soft.     Tenderness: There is abdominal tenderness in the epigastric area and periumbilical area. There is no right CVA tenderness, left CVA tenderness, guarding or rebound.     Comments: Abdomen is soft but she reports discomfort with epigastric and periumbilical palpation  Musculoskeletal:        General: No tenderness. Normal range of motion.     Comments: No edema  Skin:    General: Skin is warm and dry.     Capillary Refill: Capillary refill takes 2 to 3 seconds.     Findings: No rash.  Neurological:     Mental Status: She is alert and oriented to person, place, and time.     Cranial  Nerves: No cranial nerve deficit.  Psychiatric:        Mood and Affect: Mood and affect normal.        Behavior: Behavior normal.     ED Results / Procedures / Treatments   Labs (all labs ordered are listed, but only abnormal results are displayed) Labs Reviewed  CBC WITH DIFFERENTIAL/PLATELET - Abnormal; Notable for the following components:      Result Value   RBC 3.64 (*)    MCH 34.3 (*)    All other components within normal limits  COMPREHENSIVE METABOLIC PANEL -  Abnormal; Notable for the following components:   Potassium 3.1 (*)    CO2 20 (*)    Glucose, Bld 146 (*)    All other components within normal limits  URINALYSIS, ROUTINE W REFLEX MICROSCOPIC - Abnormal; Notable for the following components:   pH 8.5 (*)    Hgb urine dipstick SMALL (*)    Ketones, ur 80 (*)    Protein, ur 30 (*)    All other components within normal limits  URINALYSIS, MICROSCOPIC (REFLEX) - Abnormal; Notable for the following components:   Bacteria, UA FEW (*)    All other components within normal limits  LIPASE, BLOOD  PREGNANCY, URINE    EKG None  Radiology No results found.  Procedures Procedures   Medications Ordered in ED Medications  lactated ringers bolus 1,000 mL (has no administration in time range)  metoCLOPramide (REGLAN) injection 10 mg (has no administration in time range)  morphine 4 MG/ML injection 4 mg (has no administration in time range)    ED Course  I have reviewed the triage vital signs and the nursing notes.  Pertinent labs & imaging results that were available during my care of the patient were reviewed by me and considered in my medical decision making (see chart for details).    MDM Rules/Calculators/A&P                          Patient presenting with abdominal pain, nausea, vomiting, diarrhea since last night.  Patient has had no prior abdominal surgeries and low suspicion for obstruction at this time.  She has more epigastric and periumbilical  tenderness but lower suspicion for pancreatitis, hepatitis, appendicitis or diverticulitis.  Has a history of alcohol abuse but denies drinking recently.  She also does use marijuana but has never had a cannabis hyperemesis syndrome before.  She is still abusing Xanax at this time.  She is not displaying any symptoms of withdrawal at this time and has a history of opiate use disorder but that is in remission.  This could be viral in origin as well.  Patient denies any known bad food exposure or known sick contacts.  Patient given IV fluids and antiemetics.  Labs are pending.  11:46 AM Labs with mild hypokalemia of 3.1 but normal renal function and liver test.  Lipase within normal limits, CBC without acute findings, UA with evidence of dehydration with 80 ketones but no signs of infection.  UPT is negative.  Patient received 2 L of fluid, Protonix and Reglan.  She is feeling marginally better.  She has had no vomiting here.  Will attempt a p.o. challenge.  2:33 PM Pt was having some abd cramping and given bentyl which she reports has helped in the past.  2:56 PM No further vomiting.  Will d/c home with anti-emetic and f/u with pcp.  Final Clinical Impression(s) / ED Diagnoses Final diagnoses:  Nausea vomiting and diarrhea    Rx / DC Orders ED Discharge Orders         Ordered    dicyclomine (BENTYL) 20 MG tablet  2 times daily        06/02/20 1459    promethazine (PHENERGAN) 25 MG tablet  Every 6 hours PRN        06/02/20 1459           Gwyneth Sprout, MD 06/02/20 1501

## 2020-06-02 NOTE — ED Notes (Signed)
Visually chesked on Pt and she was resting well; normal breathing pattern. 100%

## 2020-06-02 NOTE — Discharge Instructions (Addendum)
Labs were normal today except for low potassium which you were given in the ER.  Liver, pancreas and kidney's normal today.  Make sure you are drinking fluids and avoid heavy meals until feeling better.  If you start having severe pain, vomiting and not improving or other concerns return for evaluation

## 2020-06-02 NOTE — ED Triage Notes (Signed)
Pt arrived from home with complaint of abdominal pain accompanied by nausea, vomiting and diarrhea. Pt stated she started experiencing symptoms last night after eating dinner but does not know if what she ate contributed to her current status.

## 2020-06-08 ENCOUNTER — Ambulatory Visit (HOSPITAL_COMMUNITY): Payer: Self-pay | Admitting: Behavioral Health

## 2020-06-15 ENCOUNTER — Ambulatory Visit (HOSPITAL_COMMUNITY): Payer: Self-pay | Admitting: Behavioral Health

## 2020-06-22 ENCOUNTER — Ambulatory Visit (HOSPITAL_COMMUNITY): Payer: Self-pay | Admitting: Behavioral Health

## 2020-06-29 ENCOUNTER — Ambulatory Visit (HOSPITAL_COMMUNITY): Payer: Self-pay | Admitting: Behavioral Health

## 2020-07-06 ENCOUNTER — Ambulatory Visit (HOSPITAL_COMMUNITY): Payer: Self-pay | Admitting: Behavioral Health

## 2020-07-10 ENCOUNTER — Emergency Department (HOSPITAL_COMMUNITY)
Admission: EM | Admit: 2020-07-10 | Discharge: 2020-07-11 | Disposition: A | Discharge status: Other Acute Inpatient Hospital | Payer: Self-pay | Attending: Emergency Medicine | Admitting: Emergency Medicine

## 2020-07-10 DIAGNOSIS — F314 Bipolar disorder, current episode depressed, severe, without psychotic features: Secondary | ICD-10-CM | POA: Insufficient documentation

## 2020-07-10 DIAGNOSIS — F1320 Sedative, hypnotic or anxiolytic dependence, uncomplicated: Secondary | ICD-10-CM | POA: Insufficient documentation

## 2020-07-10 DIAGNOSIS — Z046 Encounter for general psychiatric examination, requested by authority: Secondary | ICD-10-CM

## 2020-07-10 DIAGNOSIS — F1220 Cannabis dependence, uncomplicated: Secondary | ICD-10-CM | POA: Insufficient documentation

## 2020-07-10 DIAGNOSIS — Z20822 Contact with and (suspected) exposure to covid-19: Secondary | ICD-10-CM | POA: Insufficient documentation

## 2020-07-10 DIAGNOSIS — F17210 Nicotine dependence, cigarettes, uncomplicated: Secondary | ICD-10-CM | POA: Insufficient documentation

## 2020-07-10 NOTE — ED Triage Notes (Signed)
Pt arrived via ems due to being ivced by her friend. Pts friend reports that pt stated she was going to shoot self. Friend also reported that pt stated that she swallowed many of her buspirone pills. Upon [employer] arrival pt denies she was suicidal. Pt reported to ems that she only took 2 pills. EMS also reports pt has been drinking.

## 2020-07-11 ENCOUNTER — Encounter (HOSPITAL_COMMUNITY): Payer: Self-pay | Admitting: Urology

## 2020-07-11 ENCOUNTER — Other Ambulatory Visit: Payer: Self-pay

## 2020-07-11 ENCOUNTER — Inpatient Hospital Stay (HOSPITAL_COMMUNITY)
Admission: AD | Admit: 2020-07-11 | Discharge: 2020-07-14 | DRG: 885 | Disposition: A | Discharge status: Home / Self Care | Payer: Federal, State, Local not specified - Other | Source: Intra-hospital | Attending: Psychiatry | Admitting: Psychiatry

## 2020-07-11 DIAGNOSIS — F515 Nightmare disorder: Secondary | ICD-10-CM | POA: Diagnosis present

## 2020-07-11 DIAGNOSIS — G4700 Insomnia, unspecified: Secondary | ICD-10-CM | POA: Diagnosis present

## 2020-07-11 DIAGNOSIS — R45851 Suicidal ideations: Secondary | ICD-10-CM | POA: Diagnosis present

## 2020-07-11 DIAGNOSIS — F332 Major depressive disorder, recurrent severe without psychotic features: Principal | ICD-10-CM | POA: Diagnosis present

## 2020-07-11 DIAGNOSIS — K589 Irritable bowel syndrome without diarrhea: Secondary | ICD-10-CM | POA: Diagnosis present

## 2020-07-11 DIAGNOSIS — F314 Bipolar disorder, current episode depressed, severe, without psychotic features: Secondary | ICD-10-CM | POA: Diagnosis present

## 2020-07-11 DIAGNOSIS — Z9151 Personal history of suicidal behavior: Secondary | ICD-10-CM

## 2020-07-11 DIAGNOSIS — Z20822 Contact with and (suspected) exposure to covid-19: Secondary | ICD-10-CM | POA: Diagnosis present

## 2020-07-11 DIAGNOSIS — F17210 Nicotine dependence, cigarettes, uncomplicated: Secondary | ICD-10-CM | POA: Diagnosis present

## 2020-07-11 DIAGNOSIS — K219 Gastro-esophageal reflux disease without esophagitis: Secondary | ICD-10-CM | POA: Diagnosis present

## 2020-07-11 LAB — CBC WITH DIFFERENTIAL/PLATELET
Abs Immature Granulocytes: 0.01 10*3/uL (ref 0.00–0.07)
Basophils Absolute: 0.1 10*3/uL (ref 0.0–0.1)
Basophils Relative: 1 %
Eosinophils Absolute: 0 10*3/uL (ref 0.0–0.5)
Eosinophils Relative: 1 %
HCT: 39.2 % (ref 36.0–46.0)
Hemoglobin: 13.3 g/dL (ref 12.0–15.0)
Immature Granulocytes: 0 %
Lymphocytes Relative: 32 %
Lymphs Abs: 1.8 10*3/uL (ref 0.7–4.0)
MCH: 34.4 pg — ABNORMAL HIGH (ref 26.0–34.0)
MCHC: 33.9 g/dL (ref 30.0–36.0)
MCV: 101.3 fL — ABNORMAL HIGH (ref 80.0–100.0)
Monocytes Absolute: 0.3 10*3/uL (ref 0.1–1.0)
Monocytes Relative: 6 %
Neutro Abs: 3.4 10*3/uL (ref 1.7–7.7)
Neutrophils Relative %: 60 %
Platelets: 301 10*3/uL (ref 150–400)
RBC: 3.87 MIL/uL (ref 3.87–5.11)
RDW: 11.9 % (ref 11.5–15.5)
WBC: 5.6 10*3/uL (ref 4.0–10.5)
nRBC: 0 % (ref 0.0–0.2)

## 2020-07-11 LAB — COMPREHENSIVE METABOLIC PANEL
ALT: 17 U/L (ref 0–44)
AST: 20 U/L (ref 15–41)
Albumin: 4.2 g/dL (ref 3.5–5.0)
Alkaline Phosphatase: 41 U/L (ref 38–126)
Anion gap: 7 (ref 5–15)
BUN: 8 mg/dL (ref 6–20)
CO2: 24 mmol/L (ref 22–32)
Calcium: 9 mg/dL (ref 8.9–10.3)
Chloride: 109 mmol/L (ref 98–111)
Creatinine, Ser: 0.58 mg/dL (ref 0.44–1.00)
GFR, Estimated: >60 mL/min (ref >60)
Glucose, Bld: 84 mg/dL (ref 70–99)
Potassium: 3.7 mmol/L (ref 3.5–5.1)
Sodium: 140 mmol/L (ref 135–145)
Total Bilirubin: 0.5 mg/dL (ref 0.3–1.2)
Total Protein: 7.1 g/dL (ref 6.5–8.1)

## 2020-07-11 LAB — ACETAMINOPHEN LEVEL: Acetaminophen (Tylenol), Serum: <10 ug/mL — ABNORMAL LOW (ref 10–30)

## 2020-07-11 LAB — SALICYLATE LEVEL: Salicylate Lvl: <7 mg/dL — ABNORMAL LOW (ref 7.0–30.0)

## 2020-07-11 LAB — RAPID URINE DRUG SCREEN, HOSP PERFORMED
Amphetamines: NOT DETECTED
Barbiturates: NOT DETECTED
Benzodiazepines: POSITIVE — AB
Cocaine: NOT DETECTED
Opiates: NOT DETECTED
Tetrahydrocannabinol: POSITIVE — AB

## 2020-07-11 LAB — I-STAT BETA HCG BLOOD, ED (MC, WL, AP ONLY): I-stat hCG, quantitative: <5 m[IU]/mL (ref <5)

## 2020-07-11 LAB — ETHANOL: Alcohol, Ethyl (B): 69 mg/dL — ABNORMAL HIGH (ref <10)

## 2020-07-11 LAB — RESP PANEL BY RT-PCR (FLU A&B, COVID) ARPGX2
Influenza A by PCR: NEGATIVE
Influenza B by PCR: NEGATIVE
SARS Coronavirus 2 by RT PCR: NEGATIVE

## 2020-07-11 MED ORDER — ONDANSETRON 4 MG PO TBDP: 4.0000 mg | ORAL_TABLET | Freq: Four times a day (QID) | ORAL | Status: DC | PRN

## 2020-07-11 MED ORDER — LORAZEPAM 1 MG PO TABS: 1.0000 mg | ORAL_TABLET | Freq: Every day | ORAL | Status: DC

## 2020-07-11 MED ORDER — THIAMINE HCL 100 MG/ML IJ SOLN
100.0000 mg | Freq: Once | INTRAMUSCULAR | Status: AC
  Administered 2020-07-11: 100 mg via INTRAMUSCULAR
  Filled 2020-07-11: qty 2

## 2020-07-11 MED ORDER — LORAZEPAM 1 MG PO TABS: 1.0000 mg | ORAL_TABLET | Freq: Two times a day (BID) | ORAL | Status: DC

## 2020-07-11 MED ORDER — LOPERAMIDE HCL 2 MG PO CAPS: 2.0000 mg | ORAL_CAPSULE | ORAL | Status: DC | PRN

## 2020-07-11 MED ORDER — HYDROXYZINE HCL 25 MG PO TABS
25.0000 mg | ORAL_TABLET | Freq: Three times a day (TID) | ORAL | Status: DC | PRN
  Administered 2020-07-11 – 2020-07-13 (×3): 25 mg via ORAL
  Filled 2020-07-11 (×3): qty 1
  Filled 2020-07-11: qty 10

## 2020-07-11 MED ORDER — LORAZEPAM 1 MG PO TABS
1.0000 mg | ORAL_TABLET | Freq: Four times a day (QID) | ORAL | Status: AC
  Administered 2020-07-11 – 2020-07-12 (×4): 1 mg via ORAL
  Filled 2020-07-11 (×4): qty 1

## 2020-07-11 MED ORDER — ALUM & MAG HYDROXIDE-SIMETH 200-200-20 MG/5ML PO SUSP: 30.0000 mL | Freq: Four times a day (QID) | ORAL | Status: DC | PRN

## 2020-07-11 MED ORDER — THIAMINE HCL 100 MG PO TABS
100.0000 mg | ORAL_TABLET | Freq: Every day | ORAL | Status: DC
  Administered 2020-07-12 – 2020-07-14 (×3): 100 mg via ORAL
  Filled 2020-07-11 (×6): qty 1

## 2020-07-11 MED ORDER — PRAZOSIN HCL 2 MG PO CAPS
2.0000 mg | ORAL_CAPSULE | Freq: Every day | ORAL | Status: DC
  Administered 2020-07-11 – 2020-07-13 (×3): 2 mg via ORAL
  Filled 2020-07-11 (×2): qty 1
  Filled 2020-07-11: qty 7
  Filled 2020-07-11 (×2): qty 1

## 2020-07-11 MED ORDER — DICYCLOMINE HCL 20 MG PO TABS
20.0000 mg | ORAL_TABLET | Freq: Two times a day (BID) | ORAL | Status: DC
  Administered 2020-07-11 – 2020-07-13 (×5): 20 mg via ORAL
  Filled 2020-07-11: qty 14
  Filled 2020-07-11 (×2): qty 1
  Filled 2020-07-11: qty 14
  Filled 2020-07-11 (×8): qty 1

## 2020-07-11 MED ORDER — ALUM & MAG HYDROXIDE-SIMETH 200-200-20 MG/5ML PO SUSP: 30.0000 mL | ORAL | Status: DC | PRN

## 2020-07-11 MED ORDER — IBUPROFEN 400 MG PO TABS: 600.0000 mg | ORAL_TABLET | Freq: Three times a day (TID) | ORAL | Status: DC | PRN

## 2020-07-11 MED ORDER — ONDANSETRON HCL 4 MG PO TABS
4.0000 mg | ORAL_TABLET | Freq: Three times a day (TID) | ORAL | Status: DC | PRN
  Filled 2020-07-11: qty 1

## 2020-07-11 MED ORDER — BUSPIRONE HCL 10 MG PO TABS
10.0000 mg | ORAL_TABLET | Freq: Three times a day (TID) | ORAL | Status: DC
  Administered 2020-07-11 – 2020-07-14 (×10): 10 mg via ORAL
  Filled 2020-07-11 (×6): qty 1
  Filled 2020-07-11: qty 21
  Filled 2020-07-11 (×3): qty 1
  Filled 2020-07-11: qty 2
  Filled 2020-07-11 (×2): qty 1
  Filled 2020-07-11 (×2): qty 21
  Filled 2020-07-11: qty 1
  Filled 2020-07-11: qty 2

## 2020-07-11 MED ORDER — MAGNESIUM HYDROXIDE 400 MG/5ML PO SUSP
30.0000 mL | Freq: Every day | ORAL | Status: DC | PRN
Start: 2020-07-11 — End: 2020-07-14

## 2020-07-11 MED ORDER — GABAPENTIN 300 MG PO CAPS
300.0000 mg | ORAL_CAPSULE | Freq: Three times a day (TID) | ORAL | Status: DC
  Administered 2020-07-11 – 2020-07-14 (×10): 300 mg via ORAL
  Filled 2020-07-11 (×2): qty 1
  Filled 2020-07-11: qty 21
  Filled 2020-07-11 (×6): qty 1
  Filled 2020-07-11 (×2): qty 21
  Filled 2020-07-11 (×6): qty 1

## 2020-07-11 MED ORDER — TRAZODONE HCL 50 MG PO TABS
50.0000 mg | ORAL_TABLET | Freq: Every evening | ORAL | Status: DC | PRN
Start: 2020-07-11 — End: 2020-07-14
  Administered 2020-07-12 – 2020-07-13 (×2): 50 mg via ORAL
  Filled 2020-07-11: qty 7
  Filled 2020-07-11 (×2): qty 1

## 2020-07-11 MED ORDER — ACETAMINOPHEN 325 MG PO TABS
650.0000 mg | ORAL_TABLET | Freq: Four times a day (QID) | ORAL | Status: DC | PRN
  Administered 2020-07-11: 650 mg via ORAL
  Filled 2020-07-11: qty 2

## 2020-07-11 MED ORDER — SERTRALINE HCL 25 MG PO TABS
25.0000 mg | ORAL_TABLET | Freq: Every day | ORAL | Status: DC
  Administered 2020-07-11 – 2020-07-12 (×2): 25 mg via ORAL
  Filled 2020-07-11 (×6): qty 1

## 2020-07-11 MED ORDER — OMEPRAZOLE MAGNESIUM 20 MG PO TBEC
20.0000 mg | DELAYED_RELEASE_TABLET | Freq: Every day | ORAL | Status: DC
  Filled 2020-07-11 (×2): qty 1

## 2020-07-11 MED ORDER — ONDANSETRON 4 MG PO TBDP: 4.0000 mg | ORAL_TABLET | Freq: Three times a day (TID) | ORAL | Status: DC | PRN

## 2020-07-11 MED ORDER — ADULT MULTIVITAMIN W/MINERALS CH
1.0000 | ORAL_TABLET | Freq: Every day | ORAL | Status: DC
  Administered 2020-07-11 – 2020-07-14 (×4): 1 via ORAL
  Filled 2020-07-11 (×8): qty 1

## 2020-07-11 MED ORDER — LORAZEPAM 1 MG PO TABS: 1.0000 mg | ORAL_TABLET | Freq: Three times a day (TID) | ORAL | Status: DC

## 2020-07-11 MED ORDER — NICOTINE 21 MG/24HR TD PT24: 21.0000 mg | MEDICATED_PATCH | Freq: Every day | TRANSDERMAL | Status: DC | PRN

## 2020-07-11 MED ORDER — SERTRALINE HCL 50 MG PO TABS: 50.0000 mg | ORAL_TABLET | Freq: Every day | ORAL | 11 refills | Status: DC

## 2020-07-11 MED ORDER — OMEPRAZOLE 20 MG PO CPDR
20.0000 mg | DELAYED_RELEASE_CAPSULE | Freq: Every day | ORAL | Status: DC
  Administered 2020-07-11 – 2020-07-14 (×4): 20 mg via ORAL
  Filled 2020-07-11 (×4): qty 1
  Filled 2020-07-11: qty 7
  Filled 2020-07-11: qty 1

## 2020-07-11 MED ORDER — IBUPROFEN 600 MG PO TABS
600.0000 mg | ORAL_TABLET | Freq: Four times a day (QID) | ORAL | Status: DC | PRN
  Administered 2020-07-11 – 2020-07-13 (×2): 600 mg via ORAL
  Filled 2020-07-11 (×2): qty 1

## 2020-07-11 MED ORDER — LORAZEPAM 1 MG PO TABS
1.0000 mg | ORAL_TABLET | Freq: Four times a day (QID) | ORAL | Status: DC | PRN
  Filled 2020-07-11: qty 1

## 2020-07-11 NOTE — ED Notes (Addendum)
Pt was changed into purple scrubs and wanded by security. All belongings are locked in locker #2. Pt valuables placed in envelope and locked up by security. Pt was provided snacks and a sprite. Pt is now cooperative.

## 2020-07-11 NOTE — Progress Notes (Signed)
Patient signed voluntary consent for treatment form, per Dr. Singleton.  

## 2020-07-11 NOTE — ED Notes (Signed)
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 Sheriff left.  RN informed charged. Charge is aware. Pt has no sitter.

## 2020-07-11 NOTE — Tx Team (Signed)
Initial Treatment Plan 07/11/2020 12:16 PM [name] [id number]    PATIENT STRESSORS: Marital or family conflict Medication change or noncompliance Substance abuse   PATIENT STRENGTHS: Ability for insight Capable of independent living   PATIENT IDENTIFIED PROBLEMS: "depression"  "anxiety"  "agitation"                 DISCHARGE CRITERIA:  Ability to meet basic life and health needs Adequate post-discharge living arrangements Improved stabilization in mood, thinking, and/or behavior  PRELIMINARY DISCHARGE PLAN: Attend aftercare/continuing care group Attend PHP/IOP  PATIENT/FAMILY INVOLVEMENT: This treatment plan has been presented to and reviewed with the patient, Shian Goodnow, and/or family member.  The patient and family have been given the opportunity to ask questions and make suggestions.  Layla Barter, RN 07/11/2020, 12:16 PM

## 2020-07-11 NOTE — ED Notes (Signed)
Pt calm, cooperative making her phone call.

## 2020-07-11 NOTE — Progress Notes (Signed)
   07/11/20 2157  COVID-19 Daily Checkoff  Have you had a fever (temp > 37.80C/100F)  in the past 24 hours?  No  If you have had runny nose, nasal congestion, sneezing in the past 24 hours, has it worsened? No  COVID-19 EXPOSURE  Have you traveled outside the state in the past 14 days? No  Have you been in contact with someone with a confirmed diagnosis of COVID-19 or PUI in the past 14 days without wearing appropriate PPE? No  Have you been living in the same home as a person with confirmed diagnosis of COVID-19 or a PUI (household contact)? No  Have you been diagnosed with COVID-19? No

## 2020-07-11 NOTE — BH Assessment (Signed)
Comprehensive Clinical Assessment (CCA) Note  07/11/2020 [name] [id number]  DISPOSITION: Gave clinical report to Denise AsperEne Ajibola, NP who determined Pt meets criteria for inpatient psychiatric treatment. Binnie RailJoAnn Miller, [facility], confirmed bed availability. Pt is accepted to the service of Dr. Jola Babinskilary, room 306-1 after 0800. Number for RN report is [phone]. Notified Denise SitesLisa Sanders, PA-C and Denise BoatmanJazmine Javier, RN of acceptance.  The patient demonstrates the following risk factors for suicide: Chronic risk factors for suicide include: psychiatric disorder of bipolar disorder and PTSD, substance use disorder, previous suicide attempts by overdose and medical illness digestive problems. Acute risk factors for suicide include: family or marital conflict. Protective factors for this patient include: positive social support, positive therapeutic relationship and responsibility to others (children, family). Considering these factors, the overall suicide risk at this point appears to be high. Patient is not appropriate for outpatient follow up.  Therefore, a 1:1 sitter for suicide precautions is recommended. The EDP has been informed through secure chat.  Flowsheet Row [facility] from 08/13/2019 in [facility]  C-SSRS RISK CATEGORY High Risk Error: Question 6 not populated High Risk      Pt is a 40 year old female who [facility] via EMS and law enforcement after being petitioned for involuntary commitment by her ex-partner, Denise Bowelsatalie Littreal [phone]. Affidavit and petition states: "[name] has been diagnosed with bipolar, PTSD, depression and anxiety and has been prescribed Buspar and Zoloft and takes medication daily but hasn't been able to get anti-depressant medication in two weeks. Wa last committed 6  months ago. Has said she wanted to kill herself by shooting herself but just text her partner and said that she just took a bottle of pills."  ED record states Pt was verbally aggressive with EMS, law enforcement, and ED staff upon arrival. During assessment she was calm. She says she was "stressed out" due to arguing with her ex-partner. She describes her mood recently as "up and down." Pt says she had a stressful day at work and drank alcohol. She says while arguing with her ex-partner "I may have said a few things that made her think I was suicidal." Pt acknowledges she has recurring suicidal ideation but denies overdosing or attempting to harm herself tonight. Pt states she has attempted suicide in the past by overdosing on medications. Pt acknowledges symptoms including crying spells, loss of interest in usual pleasures, irritability, erratic sleep, decreased appetite and feelings of hopelessness. She denies current homicidal ideation. Pt reports she does have a history of being physically aggressive when intoxicated. She denies auditory or visual hallucinations.   Pt states she has does not typically drink alcohol to excess. She states she smokes approximately one gram of marijuana daily. Pt says she takes Xanax, which she gets from friends, when she can. She says her psychiatrist will not prescribe Xanax for Pt and she is considering contacting another psychiatrist to see if she can get a prescription.  Pt identifies several stressors. She says after she and her partner broke up "I was basically homeless" and moved in with her parents, which is not ideal. Pt says she has an elderly dog who has seizures. She says she and her ex-girlfriend have conflicts. Pt reports she works at [employer] and describes the job as stressful. She denies legal problems. She denies access to firearms.  Pt reports she is currently receiving outpatient medication management with Dr. Zena Miller. She says she has been trying  to arrange for weekly therapy. Pt reports she was psychiatrically hospitalized approximately six months ago at [facility]. She has been hospitalized at [facility] in the past.  Pt is dressed in hospital scrubs, alert and oriented x4. Pt speaks in a clear tone, at moderate volume and normal pace. Motor behavior appears normal. Pt reports experiencing nausea and vomited three times during assessment. Eye contact is good. Pt's mood is depressed and affect is congruent with mood. Thought process is coherent and relevant. There is no indication Pt is currently responding to internal stimuli or experiencing delusional thought content. Pt was calm and cooperative throughout assessment. She says she does not feel she needs inpatient psychiatric treatment at this time.   Chief Complaint:  Chief Complaint  Patient presents with  . ivc  . 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 No data recorded Referral name: No data recorded Referral phone number: No data recorded  Whom do you see for routine medical problems? No data recorded Practice/Facility Name: No data recorded Practice/Facility Phone Number: No data recorded Name of Contact: No data recorded Contact Number: No data recorded Contact Fax Number: No data recorded Prescriber Name: No data recorded Prescriber Address (if known): No data recorded  What Is the Reason for Your Visit/Call Today? No data recorded How Long Has This Been Causing You Problems? No data recorded What Do You Feel Would Help You the Most Today? No data recorded  Have You Recently Been in Any Inpatient Treatment (Hospital/Detox/Crisis Center/28-Day Program)? No data recorded Name/Location of  Program/Hospital:No data recorded How Long Were You There? No data recorded When Were You Discharged? No data recorded  Have You Ever Received Services From [facility] Before? No data recorded Who Do You See at [facility]? No data recorded  Have You Recently Had Any Thoughts About Hurting Yourself? No data recorded Are You Planning to Commit Suicide/Harm Yourself At This time? No data recorded  Have you Recently Had Thoughts About Hurting Someone [name]? No data recorded Explanation: No data recorded  Have You Used Any Alcohol or Drugs in the Past 24 Hours? No data recorded How Long Ago Did You Use Drugs or Alcohol? No data recorded What Did You Use and How Much? No data recorded  Do You Currently Have a Therapist/Psychiatrist? No data recorded Name of Therapist/Psychiatrist: No data recorded  Have You Been Recently Discharged From Any Office Practice or Programs? No data recorded Explanation of Discharge From Practice/Program: No data recorded    CCA Screening Triage Referral Assessment Type of Contact: No data recorded Is this Initial or Reassessment? No data recorded Date Telepsych consult ordered in [facility]:  No data recorded Time Telepsych consult ordered in [facility]:  No data recorded  Patient Reported Information Reviewed? No data recorded Patient Left Without Being Seen? No data recorded Reason for Not Completing Assessment: No data recorded  Collateral Involvement: No data recorded  Does Patient Have a Court Appointed Legal Guardian? No data recorded Name and Contact of Legal Guardian: Self  If Minor and Not Living with Parent(s), Who has Custody? Self  Is CPS involved or ever  been involved? -- (UTA)  Is APS involved or ever been involved? -- (UTA)   Patient Determined To Be At Risk for Harm To Self or Others Based on Review of Patient Reported Information or Presenting Complaint? No data recorded Method: No data recorded Availability of Means: No data  recorded Intent: No data recorded Notification Required: No data recorded Additional Information for Danger to Others Potential: No data recorded Additional Comments for Danger to Others Potential: No data recorded Are There Guns or Other Weapons in Your Home? No data recorded Types of Guns/Weapons: No data recorded Are These Weapons Safely Secured?                            No data recorded Who Could Verify You Are Able To Have These Secured: No data recorded Do You Have any Outstanding Charges, Pending Court Dates, Parole/Probation? No data recorded Contacted To Inform of Risk of Harm To Self or Others: No data recorded  Location of Assessment: [facility] ED   Does Patient Present under Involuntary Commitment? No data recorded IVC Papers Initial File Date: No data recorded  [place] of Residence: No data recorded  Patient Currently Receiving the Following Services: No data recorded  Determination of Need: No data recorded  Options For Referral: No data recorded    CCA Biopsychosocial Intake/Chief Complaint:  Pt petitioned for IVC by her girlfriend due to Pt threatening to shoot herself and stating she had overdosed on medication.  Current Symptoms/Problems: Pt reports erratic mood, erratic sleep, crying spells, irritability, anger outbursts, decreased appetite, and feelings of hopelessness.   Patient Reported Schizophrenia/Schizoaffective Diagnosis in Past: No   Strengths: Pt is willing to participate in outpatient mental health treatment  Preferences: None identified  Abilities: NA   Type of Services Patient Feels are Needed: Outpatient medication management and therapy   Initial Clinical Notes/Concerns: NA   Mental Health Symptoms Depression:  Change in energy/activity; Hopelessness; Increase/decrease in appetite; Irritability; Sleep (too much or little); Tearfulness; Weight gain/loss   Duration of Depressive symptoms: No data recorded  Mania:  Change in  energy/activity; Irritability   Anxiety:   Irritability; Sleep; Tension; Worrying   Psychosis:  None   Duration of Psychotic symptoms: No data recorded  Trauma:  None   Obsessions:  None   Compulsions:  None   Inattention:  None   Hyperactivity/Impulsivity:  N/A   Oppositional/Defiant Behaviors:  N/A   Emotional Irregularity:  Chronic feelings of emptiness; Intense/inappropriate anger; Mood lability; Recurrent suicidal behaviors/gestures/threats   Other Mood/Personality Symptoms:  NA    Mental Status Exam Appearance and self-care  Stature:  Average   Weight:  Average weight   Clothing:  Casual   Grooming:  Normal   Cosmetic use:  None   Posture/gait:  Normal   Motor activity:  Not Remarkable   Sensorium  Attention:  Normal   Concentration:  Normal   Orientation:  X5   Recall/memory:  Normal   Affect and Mood  Affect:  Depressed   Mood:  Depressed   Relating  Eye contact:  Normal   Facial expression:  Depressed   Attitude toward examiner:  Cooperative   Thought and Language  Speech flow: Clear and Coherent   Thought content:  Appropriate to Mood and Circumstances   Preoccupation:  None   Hallucinations:  None   Organization:  No data recorded  [employer] of Knowledge:  Average   Intelligence:  Average  Abstraction:  Normal   Judgement:  Impaired   Reality Testing:  Adequate   Insight:  Gaps   Decision Making:  Normal; Impulsive   Social Functioning  Social Maturity:  Impulsive   Social Judgement:  Normal   Stress  Stressors:  Family conflict; Housing; Illness; Relationship; Work   Coping Ability:  Contractor Deficits:  None   Supports:  Family     Religion: Religion/Spirituality Are You A Religious Person?: Yes What is Your Religious Affiliation?: Christian How Might This Affect Treatment?: NA  Leisure/Recreation: Leisure / Recreation Do You Have Hobbies?: Yes Leisure and Hobbies:  Music  Exercise/Diet: Exercise/Diet Do You Exercise?: No Have You Gained or Lost A Significant Amount of Weight in the Past Six Months?: Yes-Lost Number of Pounds Lost?: 10 Do You Follow a Special Diet?: No Do You Have Any Trouble Sleeping?: Yes Explanation of Sleeping Difficulties: Pt reports either not sleeping enough or sleeping too much   CCA Employment/Education Employment/Work Situation: Employment / Work Situation Employment situation: Employed 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[employer] How long has patient been employed?: 2 yeas Patient's job has been impacted by current illness: No What is the longest time patient has a held a job?: 12 year Where was the patient employed at that time?: [employer] Has patient ever been in the [employer]?: No  Education: Education Is Patient Currently Attending School?: No Did Garment/textile technologist From [employer]?: No Did Designer, television/film set?: No Did You Have Any Scientist, research (life sciences) In School?: No Did You Have An Individualized Education Program (IIEP): No Did You Have Any Difficulty At [employer]?: No Patient's Education Has Been Impacted by Current Illness: No   CCA Family/Childhood History Family and Relationship History: Family history Marital status: Long term relationship Long term relationship, how long?: 12 years What types of issues is patient dealing with in the relationship?: "you know, the usual ups and downs" Additional relationship information: NA Are you sexually active?: Yes What is your sexual orientation?: Bisexual Has your sexual activity been affected by drugs, alcohol, medication, or emotional stress?: No Does patient have children?: No  Childhood History:  Childhood History By whom was/is the patient raised?: Both parents Description of patient's relationship with caregiver when they were a child: Good when she was a child but as she got older her mother's mental health declined Patient's description of  current relationship with people who raised him/her: Good How were you disciplined when you got in trouble as a child/adolescent?: Lectured, grounded Does patient have siblings?: Yes Number of Siblings: 2 Description of patient's current relationship with siblings: brother and sister-we are not close Did patient suffer any verbal/emotional/physical/sexual abuse as a child?: Yes Did patient suffer from severe childhood neglect?: No Has patient ever been sexually abused/assaulted/raped as an adolescent or adult?: No Was the patient ever a victim of a crime or a disaster?: No Witnessed domestic violence?: No Has patient been affected by domestic violence as an adult?: No  Child/Adolescent Assessment:     CCA Substance Use Alcohol/Drug Use: Alcohol / Drug Use Pain Medications: Please see MAR Prescriptions: Please see MAR Over the Counter: Please see MAR History of alcohol / drug use?: Yes Longest period of sobriety (when/how long): Unknown Negative Consequences of Use: Legal,Financial,Personal relationships,Work / School Withdrawal Symptoms: Irritability,Agitation,Fever / Chills,Tremors,Nausea / Vomiting,Sweats,Diarrhea Substance #1 Name of Substance 1: Marijuana 1 - Age of First Use: Adolescent 1 - Amount (size/oz): 1 gram 1 - Frequency: Daily 1 - Duration: Ongoing  1 - Last Use / Amount: 07/10/2020 1 - Method of Aquiring: Dealer 1- Route of Use: Smoking Substance #2 Name of Substance 2: Xanax 2 - Age of First Use: UTA 2 - Amount (size/oz): Varies according to availability 2 - Frequency: daily when available 2 - Duration: ongoing 2 - Last Use / Amount: 07/10/2020 2 - Method of Aquiring: "Friends" 2 - Route of Substance Use: Oral Substance #3 Name of Substance 3: Alcohol 3 - Age of First Use: Adolescent 3 - Amount (size/oz): 2-3 beers 3 - Frequency: 2-3 times per month 3 - Duration: Ongoing 3 - Last Use / Amount: 07/10/2020 3 - Method of Aquiring: Store 3 - Route of  Substance Use: Oral                   ASAM's:  Six Dimensions of Multidimensional Assessment  Dimension 1:  Acute Intoxication and/or Withdrawal Potential:   Dimension 1:  Description of individual's past and current experiences of substance use and withdrawal: Pt has history of abusing substances including benzodiazepines, marijuana, alcohol  Dimension 2:  Biomedical Conditions and Complications:   Dimension 2:  Description of patient's biomedical conditions and  complications: Pt reports she has stomach problems  Dimension 3:  Emotional, Behavioral, or Cognitive Conditions and Complications:  Dimension 3:  Description of emotional, behavioral, or cognitive conditions and complications: Pt diagnosed with bipolar disorder  Dimension 4:  Readiness to Change:  Dimension 4:  Description of Readiness to Change criteria: Pt does not want to stop using Xanax  Dimension 5:  Relapse, Continued use, or Continued Problem Potential:  Dimension 5:  Relapse, continued use, or continued problem potential critiera description: Frequent relapse on substances  Dimension 6:  Recovery/Living Environment:  Dimension 6:  Recovery/Iiving environment criteria description: Lives with parents  ASAM Severity Score: ASAM's Severity Rating Score: 12  ASAM Recommended Level of Treatment: ASAM Recommended Level of Treatment: Level II Partial Hospitalization Treatment   Substance use Disorder (SUD) Substance Use Disorder (SUD)  Checklist Symptoms of Substance Use: Continued use despite having a persistent/recurrent physical/psychological problem caused/exacerbated by use,Continued use despite persistent or recurrent social, interpersonal problems, caused or exacerbated by use,Persistent desire or unsuccessful efforts to cut down or control use,Social, occupational, recreational activities given up or reduced due to use  Recommendations for Services/Supports/Treatments: Recommendations for  Services/Supports/Treatments Recommendations For Services/Supports/Treatments: Inpatient Hospitalization  DSM5 Diagnoses: Patient Active Problem List   Diagnosis Date Noted  . Opioid use disorder, moderate, in sustained remission (HCC) 01/30/2020  . MDD (major depressive disorder), recurrent episode, moderate (HCC) 12/05/2019  . Cocaine use disorder, moderate, in sustained remission (HCC) 12/05/2019  . Anxiety 12/05/2019  . PTSD (post-traumatic stress disorder) 12/05/2019  . Family discord 08/13/2019  . Drug overdose 06/27/2017  . Intermittent explosive disorder 11/22/2016  . Chronic midline low back pain 05/29/2016  . Substance induced mood disorder (HCC) 06/28/2015  . 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 Polysubstance dependence (HCC) 06/06/2015  . Polysubstance dependence including opioid type drug, episodic abuse (HCC) 06/03/2015  . Polysubstance abuse (HCC) 11/01/2013  . Benzodiazepine dependence, continuous (HCC) 11/01/2013  . Bipolar I disorder, most recent episode (or current) manic (HCC) 02/25/2013  . Substance abuse/dependence 02/21/2013    Patient Centered Plan: Patient is on the following Treatment Plan(s):  Anxiety, Depression and Substance Abuse   Referrals to Alternative Service(s): Referred to Alternative Service(s):   Place:   Date:   Time:    Referred to Alternative Service(s):  Place:   Date:   Time:    Referred to Alternative Service(s):   Place:   Date:   Time:    Referred to Alternative Service(s):   Place:   Date:   Time:     Pamalee Leyden, [facility]
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
## 2020-07-11 NOTE — [facility] Group Notes (Signed)
Psychoeducational Group Note  Date: 07-11-20 Time:  1300  Group Topic/Focus:  Making Healthy Choices:   The focus of this group is to help patients identify negative/unhealthy choices they were using prior to admission and identify positive/healthier coping strategies to replace them upon discharge.In this group, patients started asking about the brain and how the brain works with and how the chemicals work for those who use substances, the pros and cons of saboxone.  Participation Level:  Did not attend   Cing  A  
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
## 2020-07-11 NOTE — Progress Notes (Signed)
D.  Pt pleasant, flat affect on approach.  Pt denies complaints at this time other than "knot on my head" from "banging my head" prior to admission.  Pt states she does that at times when angry.  Pt requested Buspar and Gabapentin to be changed to HS for last dose as that is the way she takes them at home.  Pt states that she also takes Benadryl with her sleep medications.    A.  PA and pharmacy notified of Pt's medication time change request and this was done.  Support and encouragement offered to Pt.  Medications taken as ordered.  R.  Pt remains safe on the unit, will continue to monitor.

## 2020-07-11 NOTE — [facility] Group Notes (Addendum)
Adult Psychoeducational Group Not Date:  07/11/2020 Time:  0900-1045 Group Topic/Focus: PROGRESSIVE RELAXATION. A group where deep breathing is taught and tensing and relaxation muscle groups is used. Imagery is used as well.  Pts are asked to imagine 3 pillars that hold them up when they are not able to hold themselves up.  Participation Level:  Did not attend    Dixie Jafri A  

## 2020-07-11 NOTE — ED Notes (Signed)
Officer [place] here to take pt to [facility]

## 2020-07-11 NOTE — Progress Notes (Addendum)
Patient admitted to the adult unit under IVC commitment. She reported that her girlfriend committed her because of the things she was saying. She stated that her girlfriend was afraid that she would harm herself. She stated that at the time of the incident she felt stressed, made the comment that she gives up, and clarified that she was not suicidal. She denies suicidal and homicidal ideations. She denies AVH. She reports sometimes feeling paranoid like someone is going to take her when opening doors, or feeling like someone is going to kill her. She reported that she is employed at [employer], and lives in a camper in her parents yard. She reported taking xanax and stated that she gets xanax from her friend. She reported a past hx of alcohol abuse and reported that she doesn't drink alcohol as much. She reported that she drinks alcohol less than monthly. Patient signed voluntary consent for treatment form, per Dr. Mason Jim.

## 2020-07-11 NOTE — [facility] Group Notes (Signed)
[facility] Group Notes: (Clinical Social Work)   07/11/2020      Type of Therapy:  Group Therapy   Participation Level:  Did Not Attend - was invited both individually by MHT and by overhead announcement, chose not to attend.   Ambrose Mantle, LCSW 07/11/2020, 12:07 PM

## 2020-07-11 NOTE — BHH Suicide Risk Assessment (Addendum)
[facility] Admission Suicide Risk Assessment   Demographic factors:  Caucasian, Lesbian relationship Current Mental Status:  Suicidal ideation indicated by patient,Suicidal ideation indicated by others Loss Factors: conflict with partner Historical Factors:  Prior h/o abuse; Previous suicide attempts and previous psychiatric inpatient admissions, benzodiazepine abuse prior to admission Risk Reduction Factors:  Living with partner  Total Time Spent in Direct Patient Care:  I personally spent 45 minutes on the unit in direct patient care. The direct patient care time included face-to-face time with the patient, reviewing the patient's chart, communicating with other professionals, and coordinating care. Greater than 50% of this time was spent in counseling or coordinating care with the patient regarding goals of hospitalization, psycho-education, and discharge planning needs.  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 She was reportedly brought in by EMS to the ED after her female partner IVCed her for expressing thoughts to shoot herself and after reporting that she had overdosed on Buspar. The patient states that she and her girlfriend have been arguing, and in the context of alcohol use she made suicidal statements to her partner. The patient admits that she took "a few extra Buspar" but states it was not a suicide attempt. She admits that she told her girlfriend she wanted to hurt herself in the context of stress in their relationship. She states that she has had "a lot" of previous suicide attempts  and has had multiple previous psychiatric inpatient admissions. The last psychiatric admission was at [facility] in 2018. She is followed by Dr. Evelene Croon as an outpatient and admits that in the last few weeks she did not refill her Zoloft 50mg . She states that for the last several years she has suffered with depressive symptoms and in the last 2 weeks has been having erratic sleep cycles, low energy, poor focus, low appetite, guilt, and anhedonia. She denies current SI, intent or plan and denies HI. She denies h/o mania or hypomania. She denies h/o psychosis or paranoia. She reports a past h/o PTSD related to childhood traumas that she does not wish to discuss. She is supposed to be taking Prazosin 2mg  to help with PTSD related nightmares, Neurontin 400mg  tid to help with polysubstance use and anxiety, and Buspar 15mg  tid to help with anxiety. She states her cocaine and opiate abuse issues are in sustained remission and she is vague as to her pattern of alcohol use prior to admission. She admits to using Xanax 1-5mg  daily that she obtains from friends to "self-medicate" for her residual anxiety.She states she previously was prescribed Xanax but is unsure when she last had it filled.  She denies current withdrawal symptoms or cravings for substances. See H&P for additional admission details.   Continued Clinical Symptoms:  Alcohol Use Disorder Identification Test Final Score (AUDIT): 2 The "Alcohol Use Disorders Identification Test", Guidelines for Use in Primary Care, Second Edition.  [facility]). Score between 0-7:  no or low risk or alcohol related problems. Score between 8-15:  moderate risk of alcohol related problems. Score between 16-19:  high risk of alcohol related problems. Score 20 or above:  warrants further diagnostic evaluation for alcohol dependence and treatment.  CLINICAL FACTORS:   Depression:    Anhedonia Hopelessness Impulsivity Insomnia Severe Alcohol/Substance Abuse/Dependencies More than one psychiatric diagnosis Previous Psychiatric Diagnoses and Treatments  Psychiatric Specialty Exam: Physical Exam - see H&P  Review of Systems - see H&P  Blood pressure (!) 140/94, pulse 93, temperature 98.4 F (36.9 C), temperature source Oral, resp. rate 18, height 5\' 2"  (1.575 m), weight 58.5 kg, SpO2 99 %.Body mass index is 23.59 kg/m.  General Appearance: casually dressed in scrubs, fair hygiene  Eye Contact:  Good  Speech:  Clear and Coherent and Normal Rate  Volume:  Normal  Mood:  Depressed, anxious  Affect:  Constricted  Thought Process:  Goal Directed  Orientation:  Full (Time, Place, and Person)  Thought Content:  Logical and no evidence of acute psychosis on exam  Suicidal Thoughts:  SI reported prior to admission; denies current SI, intent or plan  Homicidal Thoughts:  Denied  Memory:  Recent;   Fair  Judgement:  Poor  Insight:  Fair  Psychomotor Activity:  Normal  Concentration:  Concentration: Fair and Attention Span: Fair  Recall:  of Knowledge:  Good  Language:  Good  Akathisia:  Negative  Assets:  Communication Skills Desire for Improvement Housing Resilience  ADL's:  Intact  Cognition:  WNL   COGNITIVE FEATURES THAT CONTRIBUTE TO RISK:  None    SUICIDE RISK:   Moderate:  Frequent suicidal ideation with limited intensity, and duration, some specificity in terms of plans, no associated intent, good self-control, limited dysphoria/symptomatology, some risk factors present, and identifiable protective factors, including available and accessible social support.  PLAN OF CARE: Patient requests to sign in for voluntary treatment at this time. She has been restarted on Zoloft 25mg  daily, Neurontin 300mg  tid, and Buspar 10mg  tid which can be titrated up to previous home doses as tolerated during admission. She has been placed on Ativan taper and CIWA  protocol for potential alcohol and benzodiazepine withdrawal. Admission labs reviewed: WBC 5.6, H/H 13.3/39.2, platelets 301; CMP WNL; Salicylate <7, Tylenol <10, ETOH 69; UDS positive for benzodiazepines and THC; pregnancy test negative, respiratory panel negative. Will order TSH, A1c, lipid and EKG. Her home Bentyl dose has been restarted and will place patient on PPI for GI protection. PDMP reviewed and no fills noted of benzodiazepines.   I certify that inpatient services furnished can reasonably be expected to improve the patient's condition.   , MD, FAPA 07/11/2020, 2:13 PM

## 2020-07-11 NOTE — ED Notes (Signed)
Pt left ambulatory with officer [place].  All belongings accounted for and given to officer

## 2020-07-11 NOTE — ED Notes (Signed)
Pt being verbally aggressive with ems shouting that they hurt her and shouting why the f is she here. Pt shouting at officers that they get to talk all night and do nothing while she has to stay her. Pt keeps shouting f word.Pt refusing vitals

## 2020-07-11 NOTE — ED Notes (Signed)
Attempted to call report to [facility], [facility] nurse to call me back. She is in a meeting

## 2020-07-11 NOTE — ED Provider Notes (Signed)
[name] EMERGENCY DEPARTMENT Provider Note   CSN: [id number] Arrival date & time: 07/10/20  2353     History Chief Complaint  Patient presents with  . ivc  . Ingestion    Ingestion of buspirone pills    [name] is a 40 y.o. female.  The history is provided by the patient and medical records.    40 y.o. F with history of anxiety, benzodiazepine abuse, bipolar disorder, depression, drug abuse, presenting to the ED under IVC petitioned by ex-girlfriend.  Patient states they have been having some issues recently, arguing a lot, etc.  Per IVC paperwork, patient voiced to ex-girlfriend that she was going to shoot herself and then later texted to say that she took a bunch of pills.  She did not say specifically what or how much.  Patient admits to being prescribed BuSpar, however does not take this as directed.  She denies taking any intentional overdose.  She did take some Goody's headache powder prior to arrival for headache.  She does admit to drinking alcohol tonight.  Denies SI/HI/AVH at present.  Patient initially irritable on arrival but calmed and cooperative now.  Past Medical History:  Diagnosis Date  . Anxiety   . Benzodiazepine abuse (HCC)   . Bipolar 1 disorder (HCC)   . Depression   . Drug abuse (HCC)   . ETOH abuse   . Polysubstance abuse [facility])     Patient Active Problem List   Diagnosis Date Noted  . Opioid use disorder, moderate, in sustained remission (HCC) 01/30/2020  . MDD (major depressive disorder), recurrent episode, moderate (HCC) 12/05/2019  . Cocaine use disorder, moderate, in sustained remission (HCC) 12/05/2019  . Anxiety 12/05/2019  . PTSD (post-traumatic stress disorder) 12/05/2019  . Family discord 08/13/2019  . Drug overdose 06/27/2017  . Intermittent explosive disorder 11/22/2016  . Chronic midline low back pain 05/29/2016  . Substance induced mood disorder (HCC) 06/28/2015  . Major depressive disorder,  recurrent, mild (HCC) 06/06/2015  . Polysubstance dependence (HCC) 06/06/2015  . Polysubstance dependence including opioid type drug, episodic abuse (HCC) 06/03/2015  . Polysubstance abuse (HCC) 11/01/2013  . Benzodiazepine dependence, continuous (HCC) 11/01/2013  . Bipolar I disorder, most recent episode (or current) manic (HCC) 02/25/2013  . Substance abuse/dependence 02/21/2013    No past surgical history on file.   OB History   No obstetric history on file.     No family history on file.  Social History   Tobacco Use  . Smoking status: Current Every Day Smoker    Packs/day: 2.00    Years: 17.00    Pack years: 34.00    Types: Cigarettes  . Smokeless tobacco: Never Used  Vaping Use  . Vaping Use: Never used  Substance Use Topics  . Alcohol use: Yes    Comment: +63  . Drug use: Yes    Types: Marijuana, IV    Comment: UDS + THC    Home Medications Prior to Admission medications   Medication Sig Start Date End Date Taking? Authorizing Provider  ALPRAZolam Prudy Feeler) 1 MG tablet Take 1 mg by mouth 3 (three) times daily as needed for anxiety.    [provider]  busPIRone (BUSPAR) 15 MG tablet TAKE 1 TABLET (15 MG TOTAL) BY MOUTH 3 (THREE) TIMES DAILY. 05/25/20 05/25/21  [name], MD  dicyclomine (BENTYL) 20 MG tablet Take 1 tablet (20 mg total) by mouth 2 (two) times daily. 06/02/20   Gwyneth Sprout, MD  gabapentin (  NEURONTIN) 400 MG capsule TAKE 1 CAPSULE (400 MG TOTAL) BY MOUTH 3 (THREE) TIMES DAILY. 05/25/20 05/25/21  [name], MD  omeprazole (PRILOSEC OTC) 20 MG tablet Take 20 mg by mouth daily.    [provider]  ondansetron (ZOFRAN ODT) 4 MG disintegrating tablet Take 1 tablet (4 mg total) by mouth every 8 (eight) hours as needed for nausea or vomiting. 01/06/20   Henderly, Britni A, PA-C  prazosin (MINIPRESS) 2 MG capsule TAKE 1 CAPSULE (2 MG TOTAL) BY MOUTH AT BEDTIME. 05/25/20 05/25/21  [name], MD  promethazine (PHENERGAN) 25 MG tablet  Take 1 tablet (25 mg total) by mouth every 6 (six) hours as needed for nausea or vomiting. 06/02/20   Gwyneth Sprout, MD  sertraline (ZOLOFT) 50 MG tablet TAKE 1 TABLET (50 MG TOTAL) BY MOUTH DAILY. 05/25/20 05/25/21  [name], MD  mirtazapine (REMERON) 15 MG tablet Take 1 tablet (15 mg total) by mouth at bedtime. 01/30/20 03/23/20  [name], MD    Allergies    Haldol [haloperidol lactate]  Review of Systems   Review of Systems  Psychiatric/Behavioral:       IVC  All other systems reviewed and are negative.   Physical Exam Updated Vital Signs There were no vitals taken for this visit.  Physical Exam Vitals and nursing note reviewed.  Constitutional:      Appearance: She is well-developed.  HENT:     Head: Normocephalic and atraumatic.  Eyes:     Conjunctiva/sclera: Conjunctivae normal.     Pupils: Pupils are equal, round, and reactive to light.  Cardiovascular:     Rate and Rhythm: Normal rate and regular rhythm.     Heart sounds: Normal heart sounds.  Pulmonary:     Effort: Pulmonary effort is normal.     Breath sounds: Normal breath sounds.  Abdominal:     General: Bowel sounds are normal.     Palpations: Abdomen is soft.  Musculoskeletal:        General: Normal range of motion.     Cervical back: Normal range of motion.  Skin:    General: Skin is warm and dry.  Neurological:     Mental Status: She is alert and oriented to person, place, and time.  Psychiatric:     Comments: Adamantly denies SI/HI/AVH     ED Results / Procedures / Treatments   Labs (all labs ordered are listed, but only abnormal results are displayed) Labs Reviewed  CBC WITH DIFFERENTIAL/PLATELET - Abnormal; Notable for the following components:      Result Value   MCV 101.3 (*)    MCH 34.4 (*)    All other components within normal limits  RAPID URINE DRUG SCREEN, HOSP PERFORMED - Abnormal; Notable for the following components:   Benzodiazepines POSITIVE (*)     Tetrahydrocannabinol POSITIVE (*)    All other components within normal limits  SALICYLATE LEVEL - Abnormal; Notable for the following components:   Salicylate Lvl <7.0 (*)    All other components within normal limits  ACETAMINOPHEN LEVEL - Abnormal; Notable for the following components:   Acetaminophen (Tylenol), Serum <10 (*)    All other components within normal limits  ETHANOL - Abnormal; Notable for the following components:   Alcohol, Ethyl (B) 69 (*)    All other components within normal limits  RESP PANEL BY RT-PCR (FLU A&B, COVID) ARPGX2  COMPREHENSIVE METABOLIC PANEL  I-STAT BETA HCG BLOOD, ED ([facility], [facility], [facility] ONLY)    EKG None  Radiology No results found.  Procedures Procedures   Medications Ordered in ED Medications - No data to display  ED Course  I have reviewed the triage vital signs and the nursing notes.  Pertinent labs & imaging results that were available during my care of the patient were reviewed by me and considered in my medical decision making (see chart for details).    MDM Rules/Calculators/A&P  40 year old female presenting to the ED under IVC petition by her ex-girlfriend.  Reportedly patient expressed that she wanted to shoot herself today and then had taken some pills.  Patient adamantly denies this.  Does admit to alcohol use this evening and took some Goody powders for headache.  Initially she is agitated and resistant to staff instructions, however after talking with her she did calm down and was directable.  Labs are overall reassuring.  UDS is positive for benzos and THC.  Ethanol is 69.  Tylenol and salicylate levels are undetectable.  Medically clear.  TTS has evaluated, has been accepted to [facility] under care of Dr. Jola Babinski.  Bed will be ready after 0800.  Patient has remained stable throughout the night, no acute events.  First exam completed.  Final Clinical Impression(s) / ED Diagnoses Final diagnoses:  Involuntary commitment    Rx / DC  Orders ED Discharge Orders    None       Garlon Hatchet, PA-C 07/11/20 [id number]    Marily Memos, MD 07/11/20 [phone]

## 2020-07-12 LAB — HEMOGLOBIN A1C
Hgb A1c MFr Bld: 5.2 % (ref 4.8–5.6)
Mean Plasma Glucose: 102.54 mg/dL

## 2020-07-12 LAB — LIPID PANEL
Cholesterol: 123 mg/dL (ref 0–200)
HDL: 39 mg/dL — ABNORMAL LOW (ref >40)
LDL Cholesterol: 73 mg/dL (ref 0–99)
Total CHOL/HDL Ratio: 3.2 RATIO
Triglycerides: 56 mg/dL (ref <150)
VLDL: 11 mg/dL (ref 0–40)

## 2020-07-12 LAB — TSH: TSH: 0.906 u[IU]/mL (ref 0.350–4.500)

## 2020-07-12 MED ORDER — LORAZEPAM 1 MG PO TABS: 1.0000 mg | ORAL_TABLET | Freq: Every day | ORAL | Status: DC

## 2020-07-12 MED ORDER — NICOTINE 21 MG/24HR TD PT24
21.0000 mg | MEDICATED_PATCH | Freq: Every day | TRANSDERMAL | Status: DC
  Administered 2020-07-12 – 2020-07-14 (×3): 21 mg via TRANSDERMAL
  Filled 2020-07-12 (×5): qty 1

## 2020-07-12 MED ORDER — SERTRALINE HCL 50 MG PO TABS
50.0000 mg | ORAL_TABLET | Freq: Every day | ORAL | Status: DC
  Administered 2020-07-13: 50 mg via ORAL
  Filled 2020-07-12: qty 1
  Filled 2020-07-12: qty 7
  Filled 2020-07-12: qty 1

## 2020-07-12 MED ORDER — DOCUSATE SODIUM 100 MG PO CAPS
100.0000 mg | ORAL_CAPSULE | Freq: Every day | ORAL | Status: DC
  Administered 2020-07-12 – 2020-07-13 (×2): 100 mg via ORAL
  Filled 2020-07-12 (×3): qty 1
  Filled 2020-07-12: qty 7
  Filled 2020-07-12: qty 1

## 2020-07-12 MED ORDER — LORAZEPAM 1 MG PO TABS
1.0000 mg | ORAL_TABLET | Freq: Two times a day (BID) | ORAL | Status: AC
  Administered 2020-07-12 – 2020-07-13 (×2): 1 mg via ORAL
  Filled 2020-07-12 (×2): qty 1

## 2020-07-12 MED ORDER — LORAZEPAM 1 MG PO TABS: 1.0000 mg | ORAL_TABLET | Freq: Two times a day (BID) | ORAL | Status: DC

## 2020-07-12 NOTE — Progress Notes (Signed)
Patient rated her day as a 6 out of 10 since her day was "not too bad". Her goal for tomorrow is to remain "calm".

## 2020-07-12 NOTE — Plan of Care (Signed)
Nurse discussed anxiety, depression and coping skills with patient.  

## 2020-07-12 NOTE — [facility] Group Notes (Signed)
LCSW Group Therapy Note  07/12/2020   Type of Therapy and Topic:  Group Therapy - Healthy vs Unhealthy Coping Skills  Participation Level:  Active  Description of Group The focus of this group was to determine what unhealthy coping techniques typically are used by group members and what healthy coping techniques would be helpful in coping with various problems. Patients were guided in becoming aware of the differences between healthy and unhealthy coping techniques. Patients were asked to identify 2-3 healthy coping skills they would like to learn to use more effectively.  Therapeutic Goals 1. Patients learned that coping is what human beings do all day long to deal with various situations in their lives 2. Patients defined and discussed healthy vs unhealthy coping techniques 3. Patients identified their preferred coping techniques and identified whether these were healthy or unhealthy 4. Patients determined 2-3 healthy coping skills they would like to become more familiar with and use more often. 5. Patients provided support and ideas to each other   Summary of Patient Progress:  Pt was seen interacting appropriately with peers during leisure time in nature.    Therapeutic Modalities Cognitive Behavioral Therapy Motivational Interviewing  Rickita Forstner M Krystalle Pilkington, LCSWA 07/12/2020  1:38 PM   

## 2020-07-12 NOTE — [facility] Group Notes (Signed)
Date: 07/12/2020 Occupational Therapy Group Note  Group encouraged increased engagement and participation through discussion focused on Stages of Change. Group reviewed and educated on the five stages of change, including pre-contemplation, contemplation, preparation, action, and maintenance. The sixth stage that not everyone goes through 'relapse' was also reviewed. Discussion focused on patients sharing their own journey with mental health and/or substance use and identified which stage they feel as though they are currently in and what steps that have to take next in order to move forward into the following stage. Peer support was also observed and encouraged throughout discussion.   Therapeutic Goal(s): Identify and review stages of change as it relates to one's own mental health and/or substance use journey. Identify and explain which stage of change one currently identifies with and what next steps one needs to take to move forward in their recovery. Participation Level: Active   Participation Quality: Independent   Behavior: Calm and Cooperative   Speech/Thought Process: Focused   Affect/Mood: Constricted   Insight: Fair   Judgement: Fair   Individualization: Kabrea was active in their participation of group discussion/activity. Pt joined group with only about 10 minutes remaining due to meeting with the provider, however was actively engaged and appeared receptive to education provided.   Modes of Intervention: Discussion, Education and Problem-solving  Patient Response to Interventions:  Attentive and Engaged   Plan: Continue to engage patient in OT groups 2 - 3x/week.  07/12/2020  Donne Hazel, MOT, OTR/L

## 2020-07-12 NOTE — Progress Notes (Signed)
Nutrition Brief Note RD working remotely.   Patient identified on the Malnutrition Screening Tool (MST) Report  Wt Readings from Last 15 Encounters:  07/11/20 58.5 kg  06/02/20 59.9 kg  01/06/20 56.2 kg  07/15/18 99.8 kg  06/30/18 99.8 kg  12/24/17 97.5 kg  06/28/17 91.2 kg  10/19/16 90.7 kg  07/17/16 86.2 kg  06/05/16 84.4 kg  04/25/16 81.6 kg  04/05/16 81.8 kg  03/02/16 74.8 kg  02/24/16 75.8 kg  11/05/15 73.5 kg    Body mass index is 23.59 kg/m. Patient meets criteria for normal weight based on current BMI. Weight has been stable for the past 6 months.   Weight yesterday was 129 lb and weight on 06/30/18 was 219 lb. This indicates 90 lb weight loss (41% body weight) in the past 2 years.   Current diet order is Regular and patient is eating as desired for meals and snacks. Labs and medications reviewed.   No nutrition interventions warranted at this time. If nutrition issues arise, please consult RD.      Trenton Gammon, MS, RD, LDN, CNSC Inpatient Clinical Dietitian RD pager # available in [facility]  After hours/weekend pager # available in [facility]

## 2020-07-12 NOTE — Tx Team (Signed)
Interdisciplinary Treatment and Diagnostic Plan Update  07/12/2020 Time of Session: **9:15am [name] MRN: [id number]  Principal Diagnosis: Major depressive disorder, recurrent severe without psychotic features [facility](HCC)  Secondary Diagnoses: Principal Problem:   Major depressive disorder, recurrent severe without psychotic features (HCC)   Current Medications:  Current Facility-Administered Medications  Medication Dose Route Frequency Provider Last Rate Last Admin  . acetaminophen (TYLENOL) tablet 650 mg  650 mg Oral Q6H PRN Ajibola, Ene A, NP   650 mg at 07/11/20 1808  . alum & mag hydroxide-simeth (MAALOX/MYLANTA) 200-200-20 MG/5ML suspension 30 mL  30 mL Oral Q4H PRN Ajibola, Ene A, NP      . busPIRone (BUSPAR) tablet 10 mg  10 mg Oral TID Charm RingsLord, Jamison Y, NP   10 mg at 07/12/20 [id number]  . dicyclomine (BENTYL) tablet 20 mg  20 mg Oral BID Charm RingsLord, Jamison Y, NP   20 mg at 07/12/20 0813  . gabapentin (NEURONTIN) capsule 300 mg  300 mg Oral TID Charm RingsLord, Jamison Y, NP   300 mg at 07/12/20 0813  . hydrOXYzine (ATARAX/VISTARIL) tablet 25 mg  25 mg Oral TID PRN Ajibola, Ene A, NP   25 mg at 07/11/20 2111  . ibuprofen (ADVIL) tablet 600 mg  600 mg Oral Q6H PRN Charm RingsLord, Jamison Y, NP   600 mg at 07/11/20 2111  . loperamide (IMODIUM) capsule 2-4 mg  2-4 mg Oral PRN Charm RingsLord, Jamison Y, NP      . LORazepam (ATIVAN) tablet 1 mg  1 mg Oral BID Laveda AbbeParks, Laurie Britton, NP       Followed by  . [START ON 07/13/2020] LORazepam (ATIVAN) tablet 1 mg  1 mg Oral Daily Laveda AbbeParks, Laurie Britton, NP      . magnesium hydroxide (MILK OF MAGNESIA) suspension 30 mL  30 mL Oral Daily PRN Ajibola, Ene A, NP      . multivitamin with minerals tablet 1 tablet  1 tablet Oral Daily Charm RingsLord, Jamison Y, NP   1 tablet at 07/12/20 0813  . nicotine (NICODERM CQ - dosed in mg/24 hours) patch 21 mg  21 mg Transdermal Daily Antonieta Pertlary, Greg Lawson, MD   21 mg at 07/12/20 0930  . omeprazole (PRILOSEC) capsule 20 mg  20 mg Oral Daily Antonieta Pertlary, Greg  Lawson, MD   20 mg at 07/12/20 0900  . ondansetron (ZOFRAN-ODT) disintegrating tablet 4 mg  4 mg Oral Q6H PRN Charm RingsLord, Jamison Y, NP      . prazosin (MINIPRESS) capsule 2 mg  2 mg Oral QHS Charm RingsLord, Jamison Y, NP   2 mg at 07/11/20 2111  . sertraline (ZOLOFT) tablet 25 mg  25 mg Oral Daily Charm RingsLord, Jamison Y, NP   25 mg at 07/12/20 0813  . thiamine tablet 100 mg  100 mg Oral Daily Charm RingsLord, Jamison Y, NP   100 mg at 07/12/20 0813  . traZODone (DESYREL) tablet 50 mg  50 mg Oral QHS PRN Ajibola, Ene A, NP   50 mg at 07/12/20 0108   PTA Medications: Medications Prior to Admission  Medication Sig Dispense Refill Last Dose  . ALPRAZolam (XANAX) 1 MG tablet Take 1 mg by mouth 3 (three) times daily as needed for anxiety.     . Aspirin-Acetaminophen-Caffeine (GOODY HEADACHE PO) Take 1 packet by mouth daily as needed (headache).     . busPIRone (BUSPAR) 15 MG tablet TAKE 1 TABLET (15 MG TOTAL) BY MOUTH 3 (THREE) TIMES DAILY. (Patient taking differently: Take 15 mg by mouth 3 (three) times daily.) 90  tablet 1   . dicyclomine (BENTYL) 20 MG tablet TAKE 1 TABLET (20 MG TOTAL) BY MOUTH 2 (TWO) TIMES DAILY. (Patient taking differently: Take 20 mg by mouth 2 (two) times daily.) 20 tablet 0   . gabapentin (NEURONTIN) 400 MG capsule TAKE 1 CAPSULE (400 MG TOTAL) BY MOUTH 3 (THREE) TIMES DAILY. (Patient taking differently: Take 400 mg by mouth 3 (three) times daily.) 90 capsule 1   . ibuprofen (ADVIL) 200 MG tablet Take 600 mg by mouth every 6 (six) hours as needed for mild pain.     Marland Kitchen omeprazole (PRILOSEC OTC) 20 MG tablet Take 20 mg by mouth daily.     . ondansetron (ZOFRAN ODT) 4 MG disintegrating tablet Take 1 tablet (4 mg total) by mouth every 8 (eight) hours as needed for nausea or vomiting. 20 tablet 0   . prazosin (MINIPRESS) 2 MG capsule TAKE 1 CAPSULE (2 MG TOTAL) BY MOUTH AT BEDTIME. (Patient taking differently: Take 2 mg by mouth at bedtime.) 30 capsule 1   . promethazine (PHENERGAN) 25 MG tablet TAKE 1 TABLET (25 MG  TOTAL) BY MOUTH EVERY 6 (SIX) HOURS AS NEEDED FOR NAUSEA OR VOMITING. (Patient taking differently: Take 25 mg by mouth every 6 (six) hours as needed for vomiting or nausea.) 30 tablet 0   . sertraline (ZOLOFT) 50 MG tablet TAKE 1 TABLET (50 MG TOTAL) BY MOUTH DAILY. (Patient taking differently: Take 50 mg by mouth daily.) 30 tablet 1     Patient Stressors: Marital or family conflict Medication change or noncompliance Substance abuse  Patient Strengths: Ability for insight Capable of independent living  Treatment Modalities: Medication Management, Group therapy, Case management,  1 to 1 session with clinician, Psychoeducation, Recreational therapy.   Physician Treatment Plan for Primary Diagnosis: Major depressive disorder, recurrent severe without psychotic features (HCC) Long Term Goal(s): Improvement in symptoms so as ready for discharge Improvement in symptoms so as ready for discharge   Short Term Goals: Ability to identify changes in lifestyle to reduce recurrence of condition will improve Ability to verbalize feelings will improve Ability to disclose and discuss suicidal ideas Ability to demonstrate self-control will improve Ability to identify and develop effective coping behaviors will improve Ability to maintain clinical measurements within normal limits will improve Compliance with prescribed medications will improve Ability to identify triggers associated with substance abuse/mental health issues will improve Ability to identify changes in lifestyle to reduce recurrence of condition will improve Ability to verbalize feelings will improve Ability to disclose and discuss suicidal ideas Ability to demonstrate self-control will improve Ability to identify and develop effective coping behaviors will improve Ability to maintain clinical measurements within normal limits will improve Compliance with prescribed medications will improve Ability to identify triggers associated with  substance abuse/mental health issues will improve  Medication Management: Evaluate patient's response, side effects, and tolerance of medication regimen.  Therapeutic Interventions: 1 to 1 sessions, Unit Group sessions and Medication administration.  Evaluation of Outcomes: Progressing  Physician Treatment Plan for Secondary Diagnosis: Principal Problem:   Major depressive disorder, recurrent severe without psychotic features (HCC)  Long Term Goal(s): Improvement in symptoms so as ready for discharge Improvement in symptoms so as ready for discharge   Short Term Goals: Ability to identify changes in lifestyle to reduce recurrence of condition will improve Ability to verbalize feelings will improve Ability to disclose and discuss suicidal ideas Ability to demonstrate self-control will improve Ability to identify and develop effective coping behaviors will improve Ability to maintain  clinical measurements within normal limits will improve Compliance with prescribed medications will improve Ability to identify triggers associated with substance abuse/mental health issues will improve Ability to identify changes in lifestyle to reduce recurrence of condition will improve Ability to verbalize feelings will improve Ability to disclose and discuss suicidal ideas Ability to demonstrate self-control will improve Ability to identify and develop effective coping behaviors will improve Ability to maintain clinical measurements within normal limits will improve Compliance with prescribed medications will improve Ability to identify triggers associated with substance abuse/mental health issues will improve     Medication Management: Evaluate patient's response, side effects, and tolerance of medication regimen.  Therapeutic Interventions: 1 to 1 sessions, Unit Group sessions and Medication administration.  Evaluation of Outcomes: Progressing   RN Treatment Plan for Primary Diagnosis: Major  depressive disorder, recurrent severe without psychotic features (HCC) Long Term Goal(s): Knowledge of disease and therapeutic regimen to maintain health will improve  Short Term Goals: Ability to remain free from injury will improve, Ability to verbalize frustration and anger appropriately will improve, Ability to demonstrate self-control, Ability to identify and develop effective coping behaviors will improve and Compliance with prescribed medications will improve  Medication Management: RN will administer medications as ordered by provider, will assess and evaluate patient's response and provide education to patient for prescribed medication. RN will report any adverse and/or side effects to prescribing provider.  Therapeutic Interventions: 1 on 1 counseling sessions, Psychoeducation, Medication administration, Evaluate responses to treatment, Monitor vital signs and CBGs as ordered, Perform/monitor CIWA, COWS, AIMS and Fall Risk screenings as ordered, Perform wound care treatments as ordered.  Evaluation of Outcomes: Progressing   LCSW Treatment Plan for Primary Diagnosis: Major depressive disorder, recurrent severe without psychotic features (HCC) Long Term Goal(s): Safe transition to appropriate next level of care at discharge, Engage patient in therapeutic group addressing interpersonal concerns.  Short Term Goals: Engage patient in aftercare planning with referrals and resources, Increase social support, Facilitate patient progression through stages of change regarding substance use diagnoses and concerns, Identify triggers associated with mental health/substance abuse issues and Increase skills for wellness and recovery  Therapeutic Interventions: Assess for all discharge needs, 1 to 1 time with Social worker, Explore available resources and support systems, Assess for adequacy in community support network, Educate family and significant other(s) on suicide prevention, Complete Psychosocial  Assessment, Interpersonal group therapy.  Evaluation of Outcomes: Progressing   Progress in Treatment: Attending groups: No. Participating in groups: No. Taking medication as prescribed: Yes. Toleration medication: Yes. Family/Significant other contact made: No, will contact:  girlfriend Patient understands diagnosis: No. Discussing patient identified problems/goals with staff: No. Medical problems stabilized or resolved: Yes. Denies suicidal/homicidal ideation: Yes. Issues/concerns per patient self-inventory: No.   New problem(s) identified: No, Describe:  none  New Short Term/Long Term Goal(s): detox, medication management for mood stabilization; elimination of SI thoughts; development of comprehensive mental wellness/sobriety plans  Patient Goals:  "I don't know. I was IVC'd"  Discharge Plan or Barriers: Patient to return home. Patient is to be established with [facility] through [facility] for continued care at discharge.   Reason for Continuation of Hospitalization: Depression Medication stabilization Suicidal ideation  Estimated Length of Stay: 1-3 days  Attendees: Patient: [name] 07/12/2020   Physician: Clifton Custard, MD 07/12/2020   Nursing:  07/12/2020  RN Care Manager: 07/12/2020   Social Worker: Ruthann Cancer, LCSW 07/12/2020  Recreational Therapist:  07/12/2020   Other:  07/12/2020   Other:  07/12/2020   Other: 07/12/2020  Scribe for Treatment Team: Otelia Santee, [place] 07/12/2020 12:25 PM

## 2020-07-12 NOTE — Progress Notes (Signed)
[facility] MD Progress Note  07/12/2020 3:35 PM [name]  MRN:  [id number] Subjective: "I am doing better than when I got here.  I feel better physically."  Objective:  Medical record reviewed.  The patient's case was discussed in detail with members of the treatment team this morning. The patient was seen and interviewed in her room today.   The patient reports she feels better than prior to admission.  She describes her depression as 4 out of 10 in intensity.  The patient denies passive wish for death, SI, AI, HI or urges to engage in nonsuicidal self-injurious behavior.  She denies AH, VH or PI.  The patient states that she is sleeping and eating okay.  She reports that she continues to have some anxiety and we discussed nonpharmacologic ways to manage anxiety in addition to the medications that patient is currently taking.  The patient reports that there is a firearm at her mother's home.  She states she is willing to sign a consent for release of information so that social worker can talk to patient's girlfriend who can then talk to mother about the firearm removal or possibly to allow social worker to speak directly to mother.  The patient denies side effects from her medications.  She is receptive to the plan of increasing her Zoloft to 50 mg and request that it be changed to nighttime dosing which is how she has taken it in the past.  The patient has been attending and participating in groups appropriately.  She is not engaged in any self-injurious behavior on the unit.  She is not engaged in any aggressive behavior on the unit.  The patient has been taking standing dose medications as prescribed.  She has not required any as needed medications.  Principal Problem: Major depressive disorder, recurrent severe without psychotic features (HCC) Diagnosis: Principal Problem:   Major depressive disorder, recurrent severe without psychotic features (HCC)  Total Time spent with patient: 25  minutes  Past Psychiatric History: See H&P.  There is reported history of prior suicide attempts via overdose on medications.  There is a reported history of multiple prior inpatient psychiatric admissions.  The most recent psychiatric admission was in 2018 at [facility].  Past Medical History:  Past Medical History:  Diagnosis Date  . Anxiety   . Benzodiazepine abuse (HCC)   . Bipolar 1 disorder (HCC)   . Depression   . Drug abuse (HCC)   . ETOH abuse   . Polysubstance abuse (HCC)    History reviewed. No pertinent surgical history. Family History: History reviewed. No pertinent family history. Family Psychiatric  History: There is no known family psychiatric history.  See H&P Social History:  Social History   Substance and Sexual Activity  Alcohol Use Yes   Comment: +63     Social History   Substance and Sexual Activity  Drug Use Yes  . Types: Marijuana, IV   Comment: UDS + THC    Social History   Socioeconomic History  . Marital status: Single    Spouse name: Not on file  . Number of children: Not on file  . Years of education: Not on file  . Highest education level: Not on file  Occupational History  . Not on file  Tobacco Use  . Smoking status: Current Every Day Smoker    Packs/day: 2.00    Years: 17.00    Pack years: 34.00    Types: Cigarettes  . Smokeless tobacco: Never  Used  Vaping Use  . Vaping Use: Never used  Substance and Sexual Activity  . Alcohol use: Yes    Comment: +63  . Drug use: Yes    Types: Marijuana, IV    Comment: UDS + THC  . Sexual activity: Never  Other Topics Concern  . Not on file  Social History Narrative  . Not on file   Social Determinants of Health   Financial Resource Strain: Not on file  Food Insecurity: Not on file  Transportation Needs: Not on file  Physical Activity: Not on file  Stress: Not on file  Social Connections: Not on file   Additional Social History:                          Sleep: Fair  Appetite:  Good  Current Medications: Current Facility-Administered Medications  Medication Dose Route Frequency Provider Last Rate Last Admin  . acetaminophen (TYLENOL) tablet 650 mg  650 mg Oral Q6H PRN Ajibola, Ene A, NP   650 mg at 07/11/20 1808  . alum & mag hydroxide-simeth (MAALOX/MYLANTA) 200-200-20 MG/5ML suspension 30 mL  30 mL Oral Q4H PRN Ajibola, Ene A, NP      . busPIRone (BUSPAR) tablet 10 mg  10 mg Oral TID Charm Rings, NP   10 mg at 07/12/20 1400  . dicyclomine (BENTYL) tablet 20 mg  20 mg Oral BID Charm Rings, NP   20 mg at 07/12/20 0813  . docusate sodium (COLACE) capsule 100 mg  100 mg Oral Daily Claudie Revering, MD      . gabapentin (NEURONTIN) capsule 300 mg  300 mg Oral TID Charm Rings, NP   300 mg at 07/12/20 1300  . hydrOXYzine (ATARAX/VISTARIL) tablet 25 mg  25 mg Oral TID PRN Ajibola, Ene A, NP   25 mg at 07/11/20 2111  . ibuprofen (ADVIL) tablet 600 mg  600 mg Oral Q6H PRN Charm Rings, NP   600 mg at 07/11/20 2111  . loperamide (IMODIUM) capsule 2-4 mg  2-4 mg Oral PRN Charm Rings, NP      . LORazepam (ATIVAN) tablet 1 mg  1 mg Oral Q12H Claudie Revering, MD      . magnesium hydroxide (MILK OF MAGNESIA) suspension 30 mL  30 mL Oral Daily PRN Ajibola, Ene A, NP      . multivitamin with minerals tablet 1 tablet  1 tablet Oral Daily Charm Rings, NP   1 tablet at 07/12/20 0813  . nicotine (NICODERM CQ - dosed in mg/24 hours) patch 21 mg  21 mg Transdermal Daily Antonieta Pert, MD   21 mg at 07/12/20 0930  . omeprazole (PRILOSEC) capsule 20 mg  20 mg Oral Daily Antonieta Pert, MD   20 mg at 07/12/20 0900  . ondansetron (ZOFRAN-ODT) disintegrating tablet 4 mg  4 mg Oral Q6H PRN Charm Rings, NP      . prazosin (MINIPRESS) capsule 2 mg  2 mg Oral QHS Charm Rings, NP   2 mg at 07/11/20 2111  . [START ON 07/13/2020] sertraline (ZOLOFT) tablet 50 mg  50 mg Oral QHS Claudie Revering, MD      . thiamine tablet 100 mg  100 mg  Oral Daily Charm Rings, NP   100 mg at 07/12/20 0813  . traZODone (DESYREL) tablet 50 mg  50 mg Oral QHS PRN Ajibola, Ene A, NP   50  mg at 07/12/20 0108    Lab Results:  Results for orders placed or performed during the hospital encounter of 07/11/20 (from the past 48 hour(s))  TSH     Status: None   Collection Time: 07/12/20  6:28 AM  Result Value Ref Range   TSH 0.906 0.350 - 4.500 uIU/mL    Comment: Performed by a 3rd Generation assay with a functional sensitivity of <=0.01 uIU/mL. Performed at [facility], [address] [place], [place] [address]   Hemoglobin A1c     Status: None   Collection Time: 07/12/20  6:28 AM  Result Value Ref Range   Hgb A1c MFr Bld 5.2 4.8 - 5.6 %    Comment: (NOTE) Pre diabetes:          5.7%-6.4%  Diabetes:              >6.4%  Glycemic control for   <7.0% adults with diabetes    Mean Plasma Glucose 102.54 mg/dL    Comment: Performed at [facility], [address] [place], [place] [address]  Lipid panel     Status: Abnormal   Collection Time: 07/12/20  6:28 AM  Result Value Ref Range   Cholesterol 123 0 - 200 mg/dL   Triglycerides 56 <098<150 mg/dL   HDL 39 (L) >11>40 mg/dL   Total CHOL/HDL Ratio 3.2 RATIO   VLDL 11 0 - 40 mg/dL   LDL Cholesterol 73 0 - 99 mg/dL    Comment:        Total Cholesterol/HDL:CHD Risk Coronary Heart Disease Risk Table                     Men   Women  1/2 Average Risk   3.4   3.3  Average Risk       5.0   4.4  2 X Average Risk   9.6   7.1  3 X Average Risk  23.4   11.0        Use the calculated Patient Ratio above and the CHD Risk Table to determine the patient's CHD Risk.        ATP III CLASSIFICATION (LDL):  <100     mg/dL   Optimal  [id number]  mg/dL   Near or Above                    Optimal  130-159  mg/dL   Borderline  [id number]  mg/dL   High  >086>190     mg/dL   Very High Performed at [facility], [address] [place], [place] [address]     Blood  Alcohol level:  Lab Results  Component Value Date   ETH 69 (H) 07/11/2020   ETH <10 08/13/2019    Metabolic Disorder Labs: Lab Results  Component Value Date   HGBA1C 5.2 07/12/2020   MPG 102.54 07/12/2020   No results found for: PROLACTIN Lab Results  Component Value Date   CHOL 123 07/12/2020   TRIG 56 07/12/2020   HDL 39 (L) 07/12/2020   CHOLHDL 3.2 07/12/2020   VLDL 11 07/12/2020   LDLCALC 73 07/12/2020    Physical Findings: AIMS: Facial and Oral Movements Muscles of Facial Expression: None, normal Lips and Perioral Area: None, normal Jaw: None, normal Tongue: None, normal,Extremity Movements Upper (arms, wrists, hands, fingers): None, normal Lower (legs, knees, ankles, toes): None, normal, Trunk Movements Neck, shoulders, hips: None, normal, Overall Severity Severity of  abnormal movements (highest score from questions above): None, normal Incapacitation due to abnormal movements: None, normal Patient's awareness of abnormal movements (rate only patient's report): No Awareness, Dental Status Current problems with teeth and/or dentures?: No Does patient usually wear dentures?: No  CIWA:  CIWA-Ar Total: 1 COWS:     Musculoskeletal: Strength & Muscle Tone: within normal limits Gait & Station: normal Patient leans: N/A  Psychiatric Specialty Exam:  Presentation  General Appearance: Appropriate for Environment; Casual; Neat  Eye Contact:Good  Speech:Clear and Coherent; Normal Rate  Speech Volume:Normal  Handedness:No data recorded  Mood and Affect  Mood:Anxious  Affect:Congruent; Constricted   Thought Process  Thought Processes:Coherent; Goal Directed  Descriptions of Associations:Intact  Orientation:Full (Time, Place and Person)  Thought Content:Logical  History of Schizophrenia/Schizoaffective disorder:No  Duration of Psychotic Symptoms:No data recorded Hallucinations:Hallucinations: None  Ideas of Reference:None  Suicidal  Thoughts:Suicidal Thoughts: No  Homicidal Thoughts:Homicidal Thoughts: No   Sensorium  Memory:Immediate Good; Recent Good; Remote Good  Judgment:Fair  Insight:Fair   Executive Functions  Concentration:Good  Attention Span:Good  Recall:Good  Fund of Knowledge:Fair  Language:Good   Psychomotor Activity  Psychomotor Activity:Psychomotor Activity: Normal   Assets  Assets:Desire for Improvement; Housing; Physical Health; Resilience; Social Support; Vocational/Educational   Sleep  Sleep:Sleep: Fair    Physical Exam: Physical Exam Vitals and nursing note reviewed.  HENT:     Head: Normocephalic and atraumatic.  Pulmonary:     Effort: Pulmonary effort is normal.  Neurological:     General: No focal deficit present.     Mental Status: She is alert and oriented to person, place, and time.    Review of Systems  Constitutional: Negative for chills, diaphoresis and fever.  HENT: Negative for sore throat.   Eyes: Negative for blurred vision.  Respiratory: Negative for cough and shortness of breath.   Cardiovascular: Negative for chest pain and palpitations.  Gastrointestinal: Positive for constipation. Negative for diarrhea, nausea and vomiting.  Genitourinary: Negative for dysuria.  Musculoskeletal: Negative for joint pain and myalgias.  Skin: Negative for rash.  Neurological: Negative for dizziness, tremors and headaches.  Endo/Heme/Allergies: Does not bruise/bleed easily.  Psychiatric/Behavioral: Positive for depression and substance abuse. Negative for hallucinations and suicidal ideas. The patient is nervous/anxious.    Blood pressure 113/79, pulse 74, temperature 97.8 F (36.6 C), temperature source Oral, resp. rate 18, height 5\' 2"  (1.575 m), weight 58.5 kg, SpO2 98 %. Body mass index is 23.59 kg/m.   Treatment Plan Summary: This is a 40 year old female with a history of depression, anxiety and polysubstance dependence who was admitted for suicidal  ideation and a threatened overdose in the setting of alcohol use and work and relationship stressors.  Inpatient psychiatric hospitalization is necessary for further treatment and management of depressive symptoms and anxiety symptoms as well as for safety of patient.  Daily contact with patient to assess and evaluate symptoms and progress in treatment and Medication management   Continue every 15 minute observation for safety of patient.  Encouraged participation in group therapy and therapeutic milieu.  I advised patient today to make a list of nonpharmacologic ways to reduce her anxiety to help reduce her overall baseline anxiety outside the hospital.  Depression/anxiety -Increase Zoloft and shift to 50 mg nightly for depression and anxiety -Continue BuSpar 10 mg 3 times daily for adjunct of treatment of depression and for anxiety -Continue hydroxyzine 25 mg 3 times daily as needed anxiety  Insomnia -Continue prazosin 2 mg nightly for nightmares -Trazodone 50 mg  nightly as needed insomnia  Benzodiazepine use disorder -Continue taper off lorazepam.  We will decrease to 1 mg every 12 hours today and then 1 mg daily starting tomorrow.  Patient's vital signs have been stable and she has no signs or symptoms of withdrawal from benzodiazepines.  Lab results reviewed.  Labs from this morning revealed lipid panel with HDL of 39 but otherwise within normal limits.  Hemoglobin A1c was 5.2.  TSH was 0.906.  Urine drug screen on admission was positive for benzodiazepines and THC  EKG with ventricular rate of 68 and QT/QTc of 404/429.  Social work is working to contact collaterals to secure firearms and obtain additional history.  Social work is also working on Public house manager.  I certify that inpatient services furnished can reasonably be expected to improve the patient's condition.   Claudie Revering, MD 07/12/2020, 3:35 PM

## 2020-07-12 NOTE — Progress Notes (Signed)
Recreation Therapy Notes  Date:  4.4.22 Time: 0930 Location: [address] Dayroom  Group Topic: Stress Management  Goal Area(s) Addresses:  Patient will identify positive stress management techniques. Patient will identify benefits of using stress management post d/c.  Intervention: Stress Management  Activity: Meditation.  LRT played Miller meditation that focused on calming anxiety.  Patients were to listen and follow along with the meditation to help overcome anxious thoughts and physical symptoms    Education:  Stress Management, Discharge Planning.   Education Outcome: Acknowledges Education  Clinical Observations/Feedback: Pt did not attend group.      [name], LRT/CTRS         [name] 07/12/2020 11:06 AM 

## 2020-07-13 NOTE — Progress Notes (Signed)
   07/12/20 2118  Psych Admission Type (Psych Patients Only)  Admission Status Voluntary  Psychosocial Assessment  Patient Complaints Irritability;Depression;Sadness  Eye Contact Fair  Facial Expression Flat  Affect Appropriate to circumstance  Speech Logical/coherent  Interaction Assertive  Motor Activity Other (Comment) (WDL)  Appearance/Hygiene Improved  Behavior Characteristics Cooperative;Appropriate to situation  Mood Sad  Thought Process  Coherency WDL  Content Blaming others  Delusions None reported or observed  Perception WDL  Hallucination None reported or observed  Judgment Poor  Confusion None  Danger to Self  Current suicidal ideation? Denies  Danger to Others  Danger to Others None reported or observed

## 2020-07-13 NOTE — Progress Notes (Signed)
Patient stated she does not want MD to talk to her mother for any reason.  Patient stated she and her mother have problems talking.  Patient wants MD/SW to call her girlfriend [name] phone [phone].  Patient stated she and her girlfriend have talked about this situation recently.  Also girlfriend spoke to patient's   Parents and the gun has been put away.    Patient stated her girlfriend will sign papers, talk to whomever to clear this matter up.  Patient will be safe when she leaves [facility].

## 2020-07-13 NOTE — Progress Notes (Signed)
[facility] MD Progress Note  07/13/2020 4:08 PM [name]  MRN:  [id number]   Subjective: " I am all right.  Less anxious.  Ready for discharge."  Objective:  Medical record reviewed.  The patient's case was discussed in detail with members of the treatment team this morning. The patient was seen and interviewed in the office on the unit today.   The patient describes her mood as "all right."  She reports feeling less depressed and rates her depression as 3 out of 10 in intensity.  Her anxiety is improved.  She denies passive wish for death, SI, AI, HI, PI, AH or VH.  The patient reports that she slept okay last night and her appetite is fair.  She states she dislikes her living situation on the property of her parents "but I have nowhere else to live right now."  She is looking forward to spending time with her pets after discharge.  She denies any side effects from her medications.  She is having some menstrual cramps but denies any other physical problems.  The patient gives permission to have her father contacted so that any firearms in her parents' home can be secured.  Patient states that she will sign a release to allow her father to be contacted.  She denies any other access to firearms.  The patient is looking forward to discharge tomorrow.  She declines any referral to substance use disorder treatment program.  She is receptive to a referral for outpatient therapy and psychiatric medication management.  The patient states that she is looking forward to returning to work and recently was promoted.  She has been communicating with her girlfriend who is supportive.  The patient has been attending and participating in groups appropriately.  She is not engaged in any self-injurious behavior on the unit.  She has not engaged in any aggressive behavior on the unit.  The patient has been taking standing dose medications as prescribed.  The patient took as needed trazodone last night for sleep.  She  took as needed hydroxyzine for anxiety at 1340 today.  She also took as needed ibuprofen for cramps at 1340 today.  Principal Problem: Major depressive disorder, recurrent severe without psychotic features (HCC) Diagnosis: Principal Problem:   Major depressive disorder, recurrent severe without psychotic features (HCC)  Total Time spent with patient: 20 minutes  Past Psychiatric History: See H&P.  There is reported history of prior suicide attempts via overdose on medications.  There is a reported history of multiple prior inpatient psychiatric admissions.  The most recent psychiatric admission was in 2018 at [facility].  Past Medical History:  Past Medical History:  Diagnosis Date  . Anxiety   . Benzodiazepine abuse (HCC)   . Bipolar 1 disorder (HCC)   . Depression   . Drug abuse (HCC)   . ETOH abuse   . Polysubstance abuse (HCC)    History reviewed. No pertinent surgical history. Family History: History reviewed. No pertinent family history. Family Psychiatric  History: There is no known family psychiatric history.  See H&P Social History:  Social History   Substance and Sexual Activity  Alcohol Use Yes   Comment: +63     Social History   Substance and Sexual Activity  Drug Use Yes  . Types: Marijuana, IV   Comment: UDS + THC    Social History   Socioeconomic History  . Marital status: Single    Spouse name: Not on file  . Number  of children: Not on file  . Years of education: Not on file  . Highest education level: Not on file  Occupational History  . Not on file  Tobacco Use  . Smoking status: Current Every Day Smoker    Packs/day: 2.00    Years: 17.00    Pack years: 34.00    Types: Cigarettes  . Smokeless tobacco: Never Used  Vaping Use  . Vaping Use: Never used  Substance and Sexual Activity  . Alcohol use: Yes    Comment: +63  . Drug use: Yes    Types: Marijuana, IV    Comment: UDS + THC  . Sexual activity: Never  Other Topics  Concern  . Not on file  Social History Narrative  . Not on file   Social Determinants of Health   Financial Resource Strain: Not on file  Food Insecurity: Not on file  Transportation Needs: Not on file  Physical Activity: Not on file  Stress: Not on file  Social Connections: Not on file   Additional Social History:                         Sleep: Fair  Appetite:  Good  Current Medications: Current Facility-Administered Medications  Medication Dose Route Frequency Provider Last Rate Last Admin  . acetaminophen (TYLENOL) tablet 650 mg  650 mg Oral Q6H PRN Ajibola, Ene A, NP   650 mg at 07/11/20 1808  . alum & mag hydroxide-simeth (MAALOX/MYLANTA) 200-200-20 MG/5ML suspension 30 mL  30 mL Oral Q4H PRN Ajibola, Ene A, NP      . busPIRone (BUSPAR) tablet 10 mg  10 mg Oral TID Charm Rings, NP   10 mg at 07/13/20 1340  . dicyclomine (BENTYL) tablet 20 mg  20 mg Oral BID Charm Rings, NP   20 mg at 07/13/20 0740  . docusate sodium (COLACE) capsule 100 mg  100 mg Oral Daily Claudie Revering, MD   100 mg at 07/13/20 0740  . gabapentin (NEURONTIN) capsule 300 mg  300 mg Oral TID Charm Rings, NP   300 mg at 07/13/20 1340  . hydrOXYzine (ATARAX/VISTARIL) tablet 25 mg  25 mg Oral TID PRN Ajibola, Ene A, NP   25 mg at 07/13/20 1342  . ibuprofen (ADVIL) tablet 600 mg  600 mg Oral Q6H PRN Charm Rings, NP   600 mg at 07/13/20 1342  . loperamide (IMODIUM) capsule 2-4 mg  2-4 mg Oral PRN Charm Rings, NP      . magnesium hydroxide (MILK OF MAGNESIA) suspension 30 mL  30 mL Oral Daily PRN Ajibola, Ene A, NP      . multivitamin with minerals tablet 1 tablet  1 tablet Oral Daily Charm Rings, NP   1 tablet at 07/13/20 0740  . nicotine (NICODERM CQ - dosed in mg/24 hours) patch 21 mg  21 mg Transdermal Daily Antonieta Pert, MD   21 mg at 07/13/20 0738  . omeprazole (PRILOSEC) capsule 20 mg  20 mg Oral Daily Antonieta Pert, MD   20 mg at 07/13/20 0740  . ondansetron  (ZOFRAN-ODT) disintegrating tablet 4 mg  4 mg Oral Q6H PRN Charm Rings, NP      . prazosin (MINIPRESS) capsule 2 mg  2 mg Oral QHS Charm Rings, NP   2 mg at 07/12/20 2118  . sertraline (ZOLOFT) tablet 50 mg  50 mg Oral QHS Claudie Revering, MD      .  thiamine tablet 100 mg  100 mg Oral Daily Charm Rings, NP   100 mg at 07/13/20 0739  . traZODone (DESYREL) tablet 50 mg  50 mg Oral QHS PRN Ajibola, Ene A, NP   50 mg at 07/12/20 0108    Lab Results:  Results for orders placed or performed during the hospital encounter of 07/11/20 (from the past 48 hour(s))  TSH     Status: None   Collection Time: 07/12/20  6:28 AM  Result Value Ref Range   TSH 0.906 0.350 - 4.500 uIU/mL    Comment: Performed by a 3rd Generation assay with a functional sensitivity of <=0.01 uIU/mL. Performed at [facility], [address], [place], [place] [address]   Hemoglobin A1c     Status: None   Collection Time: 07/12/20  6:28 AM  Result Value Ref Range   Hgb A1c MFr Bld 5.2 4.8 - 5.6 %    Comment: (NOTE) Pre diabetes:          5.7%-6.4%  Diabetes:              >6.4%  Glycemic control for   <7.0% adults with diabetes    Mean Plasma Glucose 102.54 mg/dL    Comment: Performed at [facility], [address] [place], [place] [address]  Lipid panel     Status: Abnormal   Collection Time: 07/12/20  6:28 AM  Result Value Ref Range   Cholesterol 123 0 - 200 mg/dL   Triglycerides 56 <102 mg/dL   HDL 39 (L) >72 mg/dL   Total CHOL/HDL Ratio 3.2 RATIO   VLDL 11 0 - 40 mg/dL   LDL Cholesterol 73 0 - 99 mg/dL    Comment:        Total Cholesterol/HDL:CHD Risk Coronary Heart Disease Risk Table                     Men   Women  1/2 Average Risk   3.4   3.3  Average Risk       5.0   4.4  2 X Average Risk   9.6   7.1  3 X Average Risk  23.4   11.0        Use the calculated Patient Ratio above and the CHD Risk Table to determine the patient's CHD Risk.        ATP III  CLASSIFICATION (LDL):  <100     mg/dL   Optimal  536-644  mg/dL   Near or Above                    Optimal  130-159  mg/dL   Borderline  034-742  mg/dL   High  >595     mg/dL   Very High Performed at [facility], [address] [place], [place] [address]     Blood Alcohol level:  Lab Results  Component Value Date   ETH 69 (H) 07/11/2020   ETH <10 08/13/2019    Metabolic Disorder Labs: Lab Results  Component Value Date   HGBA1C 5.2 07/12/2020   MPG 102.54 07/12/2020   No results found for: PROLACTIN Lab Results  Component Value Date   CHOL 123 07/12/2020   TRIG 56 07/12/2020   HDL 39 (L) 07/12/2020   CHOLHDL 3.2 07/12/2020   VLDL 11 07/12/2020   LDLCALC 73 07/12/2020    Physical Findings: AIMS: Facial and Oral Movements Muscles of Facial Expression:  None, normal Lips and Perioral Area: None, normal Jaw: None, normal Tongue: None, normal,Extremity Movements Upper (arms, wrists, hands, fingers): None, normal Lower (legs, knees, ankles, toes): None, normal, Trunk Movements Neck, shoulders, hips: None, normal, Overall Severity Severity of abnormal movements (highest score from questions above): None, normal Incapacitation due to abnormal movements: None, normal Patient's awareness of abnormal movements (rate only patient's report): No Awareness, Dental Status Current problems with teeth and/or dentures?: No Does patient usually wear dentures?: No  CIWA:  CIWA-Ar Total: 1 COWS:     Musculoskeletal: Strength & Muscle Tone: within normal limits Gait & Station: normal Patient leans: N/A  Psychiatric Specialty Exam:  Presentation  General Appearance: Appropriate for Environment; Neat  Eye Contact:Good  Speech:Clear and Coherent; Normal Rate  Speech Volume:Normal  Handedness:No data recorded  Mood and Affect  Mood:Anxious  Affect:Congruent   Thought Process  Thought Processes:Coherent; Goal Directed; Linear  Descriptions of  Associations:Intact  Orientation:Full (Time, Place and Person)  Thought Content:Logical  History of Schizophrenia/Schizoaffective disorder:No  Duration of Psychotic Symptoms:No data recorded Hallucinations:Hallucinations: None  Ideas of Reference:None  Suicidal Thoughts:Suicidal Thoughts: No  Homicidal Thoughts:Homicidal Thoughts: No   Sensorium  Memory:Immediate Good; Recent Good; Remote Good  Judgment:Good  Insight:Good   Executive Functions  Concentration:Good  Attention Span:Good  Recall:Good  Fund of Knowledge:Good  Language:Good   Psychomotor Activity  Psychomotor Activity:Psychomotor Activity: Normal   Assets  Assets:Communication Skills; Desire for Improvement; Housing; Physical Health; Resilience; Social Support; Vocational/Educational   Sleep  Sleep:Sleep: Good    Physical Exam: Physical Exam Vitals and nursing note reviewed.  HENT:     Head: Normocephalic and atraumatic.  Pulmonary:     Effort: Pulmonary effort is normal.  Neurological:     General: No focal deficit present.     Mental Status: She is alert and oriented to person, place, and time.    Review of Systems  Constitutional: Negative for chills, diaphoresis and fever.  HENT: Negative for sore throat.   Eyes: Negative for blurred vision.  Respiratory: Negative for cough and shortness of breath.   Cardiovascular: Negative for chest pain and palpitations.  Gastrointestinal: Negative for constipation, diarrhea, nausea and vomiting.  Genitourinary: Negative for dysuria.  Musculoskeletal: Negative for joint pain and myalgias.  Skin: Negative for rash.  Neurological: Negative for dizziness, tremors and headaches.  Endo/Heme/Allergies: Does not bruise/bleed easily.  Psychiatric/Behavioral: Negative for hallucinations and suicidal ideas. The patient is nervous/anxious.    Blood pressure 104/82, pulse (!) 109, temperature 98.1 F (36.7 C), temperature source Oral, resp. rate 18,  height 5\' 2"  (1.575 m), weight 58.5 kg, SpO2 100 %. Body mass index is 23.59 kg/m.   Treatment Plan Summary: This is a 40 year old female with a history of depression, anxiety and polysubstance abuse who was admitted for suicidal ideation and a threatened overdose in the setting of alcohol use and work and relationship stressors.  The patient has improved with significantly decreased anxiety and mood symptoms since admission.  She is nearing readiness for discharge.  Anticipate probable discharge tomorrow 07/14/20 after social work has been able to contact patient's parents and make sure any firearms in the patient's parents home are secured and not accessible to patient.  She will be living on the same property as her parents after discharge.  Daily contact with patient to assess and evaluate symptoms and progress in treatment and Medication management   Continue every 15 minute observation for safety of patient.  Encouraged participation in group therapy and therapeutic milieu.  Depression/anxiety -Continue Zoloft 50 mg nightly for depression and anxiety -Continue BuSpar 10 mg 3 times daily for adjunct of treatment of depression and for anxiety -Continue hydroxyzine 25 mg 3 times daily as needed anxiety -Continue gabapentin 300 mg 3 times daily  Insomnia -Continue prazosin 2 mg nightly for nightmares -Trazodone 50 mg nightly as needed insomnia  Benzodiazepine use disorder -Lorazepam taper has been completed this morning.  Patient's vital signs stable and she displays no signs or symptoms of benzodiazepine withdrawal.  I advised her to participate in a substance use disorder treatment program as an outpatient or on a residential paces.  Patient declines to participate.  Social work is working to contact collaterals to secure firearms and obtain additional history.  Social work is also working on Public house manager.  I certify that inpatient services furnished can reasonably be expected to  improve the patient's condition.   Claudie Revering, MD 07/13/2020, 4:08 PM

## 2020-07-13 NOTE — Plan of Care (Addendum)
D:  Patient's self inventory sheet, patient sleeps good, no sleep medication.  Fair to poor appetite, normal energy level, poor concentration.  Rated depression and hopeless 5, anxiety 8.  Withdrawals, tremors, chilling, agitation, irritability.  Denied SI.  Denied physical problems.  Physical pain, stomach/arm, worst pain #4 in past 24 hours.  Pain medicine is helpful.  A:  Medications administered per MD orders.  Emotional support and encouragement given patient. R:  Denied SI and HI, contracts for safety.  Denied A/V hallucinations.  Safety maintained with 15 minute checks.

## 2020-07-13 NOTE — Progress Notes (Signed)
Pt was on the 400 hall using the phone when a phone call came in for another pt. Writer gave the pt more time on the phone and asked the caller to call back in another 15 min. When the caller phoned back I asked the pt if they would allow the other pt to use the phone. Pt began to curse at [employer], responding angrily, "wait let me hang up the damn phone first!". Pt hung up the phone and proceeded to curse out Clinical research associate and told Clinical research associate that the pt could have used another phone. Pt picked up the phone on the 300 hall and slammed it on the receiver making a loud bang. Writer asked pt if they were alright and they responded "are you alright!". Writer asked pt not to damage hospital property and pt denied tossing the phone. Pt proceeded to call writer a "bitch and told writer to shut the fuck up talking to me!". Writer stopped interacting with the pt while pt began to pace the floor and glare at [employer].

## 2020-07-13 NOTE — [facility] Counselor (Signed)
Pt's father, [name] [phone] stated that the firearm in his/his wife home is secured. He repeatedly asked what brought pt to the hospital and CSW informed him that she was not able to disclose that information at this time due to the pt's request. Pt's father then stated, "When do you think she will do this again another 2-3 days?". CSW stated that she was unsure what he was asking and he laughed. CSW informed [name] that if he has any concerns he can voice them to CSW or he can speak to pt's doctor. He said no thank you. CSW gave [name] the number for the unit and encouraged him to contact the unit if he has any concerns.   Fredirick Lathe, LCSWA Clinicial Social Worker [employer]

## 2020-07-13 NOTE — Progress Notes (Signed)
   07/13/20 2000  Psych Admission Type (Psych Patients Only)  Admission Status Voluntary  Psychosocial Assessment  Patient Complaints Anxiety;Depression;Irritability;Agitation  Eye Contact Fair  Facial Expression Anxious;Pained  Affect Anxious;Irritable  Speech Logical/coherent  Interaction Assertive;Defensive  Motor Activity Other (Comment) (wnl)  Appearance/Hygiene Unremarkable  Behavior Characteristics Agitated;Irritable  Mood Depressed;Anxious  Thought Process  Coherency WDL  Content Blaming others  Delusions None reported or observed  Perception WDL  Hallucination None reported or observed  Judgment Poor  Confusion None  Danger to Self  Current suicidal ideation? Denies  Danger to Others  Danger to Others None reported or observed   Pt seen at nurse's station. Pt somewhat irritable. Denies SI, HI, AVH and pain. Pt c/o neck stiffness. Rates anxiety as very high (no number given) and depression as 5/10. Pt says she may be discharged tomorrow or the next day. Pt lives in a trailer behind her mother's house. She says that she and her mother don't get along but she has to live there because she doesn't have water hookup in her trailer. She has a girlfriend but they don't live together because she (the GF) is taking care of her children and they can't stay together right now. Pt endorses loneliness and lack of friends so she is to herself a lot. We talked about working on herself and also trying to stay in contact with family she has a good relationship with.

## 2020-07-13 NOTE — Progress Notes (Signed)
Adult Psychoeducational Group Note  Date:  07/13/2020 Time:  10:29 AM  Group Topic/Focus:  Goals Group:   The focus of this group is to help patients establish daily goals to achieve during treatment and discuss how the patient can incorporate goal setting into their daily lives to aide in recovery.  Participation Level:  Active  Participation Quality:  Appropriate  Affect:  Appropriate  Cognitive:  Appropriate  Insight: Appropriate  Engagement in Group:  Engaged  Modes of Intervention:  Discussion  Additional Comments:  Pt attended group and participated in discussion.  Phi Avans R Zuriah Bordas 07/13/2020, 10:29 AM

## 2020-07-13 NOTE — Plan of Care (Signed)
Nurse discussed anxiety, depression and coping skills with patient.  

## 2020-07-14 ENCOUNTER — Other Ambulatory Visit: Payer: Self-pay

## 2020-07-14 MED ORDER — DOCUSATE SODIUM 100 MG PO CAPS: 100.0000 mg | ORAL_CAPSULE | Freq: Every day | ORAL | 0 refills | Status: DC

## 2020-07-14 MED ORDER — GABAPENTIN 300 MG PO CAPS: 300.0000 mg | ORAL_CAPSULE | Freq: Three times a day (TID) | ORAL | 0 refills | Status: DC

## 2020-07-14 MED ORDER — SERTRALINE HCL 50 MG PO TABS: 50.0000 mg | ORAL_TABLET | Freq: Every day | ORAL | 0 refills | Status: DC

## 2020-07-14 MED ORDER — HYDROXYZINE HCL 25 MG PO TABS: 25.0000 mg | ORAL_TABLET | Freq: Three times a day (TID) | ORAL | 0 refills | Status: DC | PRN

## 2020-07-14 MED ORDER — BUSPIRONE HCL 10 MG PO TABS: 10.0000 mg | ORAL_TABLET | Freq: Three times a day (TID) | ORAL | 0 refills | Status: DC

## 2020-07-14 MED ORDER — PRAZOSIN HCL 2 MG PO CAPS: 2.0000 mg | ORAL_CAPSULE | Freq: Every day | ORAL | 0 refills | Status: DC

## 2020-07-14 MED ORDER — DICYCLOMINE HCL 20 MG PO TABS: 20.0000 mg | ORAL_TABLET | Freq: Two times a day (BID) | ORAL | 0 refills | Status: DC

## 2020-07-14 MED ORDER — NICOTINE 21 MG/24HR TD PT24: 21.0000 mg | MEDICATED_PATCH | Freq: Every day | TRANSDERMAL | 0 refills | Status: DC

## 2020-07-14 MED ORDER — TRAZODONE HCL 50 MG PO TABS: 50.0000 mg | ORAL_TABLET | Freq: Every evening | ORAL | 0 refills | Status: DC | PRN

## 2020-07-14 MED FILL — Sertraline HCl Tab 50 MG: ORAL | 30 days supply | Qty: 30 | Fill #0 | Status: CN

## 2020-07-14 NOTE — Progress Notes (Signed)
RN met with pt and reviewed pt's discharge instructions.  Pt verbalized understanding of discharge instructions and pt did not have any questions. RN reviewed and provided pt with a copy of SRA, AVS and Transition Record.  RN returned pt's belongings to pt.  Pt denied SI/HI/AVH and voiced no concerns.  Pt was appreciative of the care pt received at [facility].  Patient discharged to the lobby without incident. 

## 2020-07-14 NOTE — Discharge Summary (Signed)
Physician Discharge Summary Note  Patient:  [name] is an 40 y.o., female MRN:  [id number] DOB:  May 11, 1980 Patient phone:  [phone] (home)  Patient address:   [address] [place] [address],   Total Time spent with patient: Greater than 30 minutes  Date of Admission:  07/11/2020  Date of Discharge: 07-14-20  Reason for Admission: Expression of suicidal thoughts to shoot herself & that she had overdosed on Buspar.   Principal Problem: Major depressive disorder, recurrent severe without psychotic features [facility])  Discharge Diagnoses: Principal Problem:   Major depressive disorder, recurrent severe without psychotic features (HCC)  Past Psychiatric History: Major depressive disorder, recurrent.  Past Medical History:  Past Medical History:  Diagnosis Date  . Anxiety   . Benzodiazepine abuse (HCC)   . Bipolar 1 disorder (HCC)   . Depression   . Drug abuse (HCC)   . ETOH abuse   . Polysubstance abuse (HCC)    History reviewed. No pertinent surgical history.  Family History: History reviewed. No pertinent family history.  Family Psychiatric  History: See H&P  Social History:  Social History   Substance and Sexual Activity  Alcohol Use Yes   Comment: +63     Social History   Substance and Sexual Activity  Drug Use Yes  . Types: Marijuana, IV   Comment: UDS + THC    Social History   Socioeconomic History  . Marital status: Single    Spouse name: Not on file  . Number of children: Not on file  . Years of education: Not on file  . Highest education level: Not on file  Occupational History  . Not on file  Tobacco Use  . Smoking status: Current Every Day Smoker    Packs/day: 2.00    Years: 17.00    Pack years: 34.00    Types: Cigarettes  . Smokeless tobacco: Never Used  Vaping Use  . Vaping Use: Never used  Substance and Sexual Activity  . Alcohol use: Yes    Comment: +63  . Drug use: Yes    Types: Marijuana, IV    Comment:  UDS + THC  . Sexual activity: Never  Other Topics Concern  . Not on file  Social History Narrative  . 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 She was reportedly brought in by EMS to the ED after her female partner IVCed her for expressing thoughts to shoot herself and after reporting that she had overdosed on Buspar. The patient states that she and her girlfriend have been arguing, and in the context of alcohol use she made suicidal statements to her partner. The patient admits that she took "a few extra Buspar" but states it was not a suicide attempt. She admits that she told her girlfriend she wanted to hurt herself in the context of stress in their relationship. She states that she has had "a lot" of previous suicide attempts and has had multiple previous psychiatric inpatient admissions. The last psychiatric admission [facility] at [facility] in [facility]. She is followed by Dr. Evelene Croon as an outpatient and admits that in the last few weeks she did not refill her Zoloft . She states that for the last several years she has suffered with depressive symptoms and in the last 2 weeks has been having erratic sleep cycles, low energy, poor focus, low appetite, guilt, and anhedonia. She denies current SI, intent or plan and denies HI. She denies h/o mania or hypomania. She denies h/o psychosis or paranoia. She reports a past h/o PTSD related to childhood traumas that she does not wish to discuss. She is supposed to be taking Prazosin   to help with PTSD related nightmares, Neurontin  tid to help with polysubstance use and anxiety, and Buspar  tid to help with anxiety. She states her cocaine and opiate abuse issues are in sustained remission and she is vague as to her pattern of alcohol use prior to admission. She admits to using Xanax 1-5mg  daily that she obtains from friends to "self-medicate" for her residual anxiety.She states she previously was prescribed Xanax but is unsure when she last had it filled.  She denies current withdrawal symptoms or cravings for substances. See H&P for additional admission details.   This is one of several psychiatric admissions/discharge summaries from the [place] systems for this 40 year old AA female. She is with hx of chronic mental illness, PTSD, multiple suicide attempts, polysubstance substance use disorders & multiple psychiatric admissions. She is well known in this [facility] & in other psychiatric hospitals within the area for similar complaints. [name] has over the years been tried on multiple psychotropic medications for her symptoms & it appeared she may not been compliant with her recommended treatment regimen in parts due to substance abuse/use issues. She was brought to the hospital this time around for evaluation & treatment for making suicidal threats & reported an attempt by overdose on Buspar. She cited as her trigger stress from her relationship.  After evaluation of her presenting symptoms this time, [name] was recommended for mood stabilization treatments. The medication regimen for her presenting symptoms were discussed & with her consent initiated. She received, stabilized & was discharged on the medications as listed below on her discharge medication lists. She was also enrolled & participated in the group counseling sessions being offered & held on this unit. She learned coping skills. She presented on this admission, other chronic medical conditions that required  treatment & monitoring. She was resumed, treated & discharged on all her pertinent home medications for those pre-existing health issues. She tolerated her treatment regimen without any adverse effects or reactions reported. Her symptoms responded well to her treatment regimen warranting this discharge.  During the course of this present hospitalization, the 15-minute checks were adequate to ensure Evi's safety.  Patient did not display any dangerous, violent or suicidal behavior on the unit. She interacted with patients & staff appropriately, participated appropriately in the group sessions/therapies. Her medications were addressed & adjusted to meet her needs. She was recommended for outpatient follow-up care & medication management upon discharge to assure her continuity of care.  At the time of discharge, patient is not reporting any acute suicidal/homicidal ideations. She currently denies any new issues or concerns. Education and supportive counseling provided throughout her hospital stay & upon discharge.  Today upon her discharge evaluation with the attending psychiatrist, Denni shares she is doing well. She denies any other specific concerns. She is sleeping well. Her appetite is good. She denies other  physical complaints. She denies AH/VH, delusional thoughts or paranoia. She feels that her medications have been helpful & is in agreement to continue her current treatment regimen as recommended. She was able to engage in safety planning including plan to return to [facility] or contact emergency services if she feels unable to maintain her own safety or the safety of others. Pt had no further questions, comments, or concerns. She left [facility] with all personal belongings in no apparent distress. Transportation per girlfriend.  Physical Findings: AIMS: Facial and Oral Movements Muscles of Facial Expression: None, normal Lips and Perioral Area: None, normal Jaw: None, normal Tongue: None,  normal,Extremity Movements Upper (arms, wrists, hands, fingers): None, normal Lower (legs, knees, ankles, toes): None, normal, Trunk Movements Neck, shoulders, hips: None, normal, Overall Severity Severity of abnormal movements (highest score from questions above): None, normal Incapacitation due to abnormal movements: None, normal Patient's awareness of abnormal movements (rate only patient's report): No Awareness, Dental Status Current problems with teeth and/or dentures?: No Does patient usually wear dentures?: No  CIWA:  CIWA-Ar Total: 1 COWS:     Musculoskeletal: Strength & Muscle Tone: within normal limits Gait & Station: normal Patient leans: N/A  Psychiatric Specialty Exam: See Md's discharge SRA>  Sleep  Sleep:Sleep: Good  Physical Exam: Physical Exam Vitals and nursing note reviewed.  HENT:     Head: Normocephalic.     Nose: Nose normal.     Mouth/Throat:     Mouth: Mucous membranes are moist.  Eyes:     Pupils: Pupils are equal, round, and reactive to light.  Cardiovascular:     Rate and Rhythm: Normal rate.     Pulses: Normal pulses.  Pulmonary:     Effort: Pulmonary effort is normal.  Genitourinary:    Comments: Deferred Musculoskeletal:        General: Normal range of motion.     Cervical back: Normal range of motion.  Skin:    General: Skin is warm and dry.  Neurological:     General: No focal deficit present.     Mental Status: She is alert and oriented to person, place, and time. Mental status is at baseline.    Review of Systems  Constitutional: Negative for chills, diaphoresis and fever.  HENT: Negative for congestion and sore throat.   Eyes: Negative for blurred vision.  Respiratory: Negative for cough, shortness of breath and wheezing.   Cardiovascular: Negative for chest pain and palpitations.  Gastrointestinal: Negative for abdominal pain, constipation, diarrhea, heartburn, nausea and vomiting.  Genitourinary: Negative for dysuria.   Musculoskeletal: Negative for joint pain and myalgias.  Skin: Negative.   Neurological: Negative for dizziness, tingling, tremors, sensory change, speech change, focal weakness, seizures, loss of consciousness, weakness and headaches.  Endo/Heme/Allergies: Negative for environmental allergies. Does not bruise/bleed easily.       Allergies: Haldol  Psychiatric/Behavioral: Positive for depression (Stabilized with medication prior to discharge) and substance abuse (Hx. alcohol/Benzodiazepine/THC use disorders). Negative for hallucinations, memory loss and suicidal ideas. The patient has insomnia (Hx. of stabilized with medication upon discharge). The patient is not nervous/anxious (Stable upon discharge).    Blood pressure 121/88, pulse (!) 58, temperature 98.5 F (36.9 C), temperature source Oral, resp. rate 18, height 5\' 2"  (1.575 m), weight 58.5 kg, SpO2 100 %. Body mass index is 23.59 kg/m.  Have you used any form of tobacco in the last 30 days? (Cigarettes, Smokeless Tobacco, Cigars, and/or Pipes): Yes  Has this patient used any form of tobacco  in the last 30 days? (Cigarettes, Smokeless Tobacco, Cigars, and/or Pipes): Yes, an FDA-approved tobacco cessation medication was offered at discharge.  Blood Alcohol level:  Lab Results  Component Value Date   ETH 69 (H) 07/11/2020   ETH <10 08/13/2019   Metabolic Disorder Labs:  Lab Results  Component Value Date   HGBA1C 5.2 07/12/2020   MPG 102.54 07/12/2020   No results found for: PROLACTIN Lab Results  Component Value Date   CHOL 123 07/12/2020   TRIG 56 07/12/2020   HDL 39 (L) 07/12/2020   CHOLHDL 3.2 07/12/2020   VLDL 11 07/12/2020   LDLCALC 73 07/12/2020   See Psychiatric Specialty Exam and Suicide Risk Assessment completed by Attending Physician prior to discharge.  Discharge destination:  Home  Is patient on multiple antipsychotic therapies at discharge:  No   Has Patient had three or more failed trials of antipsychotic  monotherapy by history:  No  Recommended Plan for Multiple Antipsychotic Therapies: NA  Allergies as of 07/14/2020      Reactions   Haldol [haloperidol Lactate] Other (See Comments)   Makes whole body stiff      Medication List    STOP taking these medications   ALPRAZolam 1 MG tablet Commonly known as: XANAX   GOODY HEADACHE PO   ibuprofen 200 MG tablet Commonly known as: ADVIL   promethazine 25 MG tablet Commonly known as: PHENERGAN     TAKE these medications     Indication  busPIRone 10 MG tablet Commonly known as: BUSPAR Take 1 tablet (10 mg total) by mouth 3 (three) times daily. For anxiety What changed:   medication strength  how much to take  how to take this  when to take this  additional instructions  Indication: Anxiety Disorder   dicyclomine 20 MG tablet Commonly known as: BENTYL Take 1 tablet (20 mg total) by mouth 2 (two) times daily. For IBS What changed:   how much to take  additional instructions  Indication: Irritable Bowel Syndrome   docusate sodium 100 MG capsule Commonly known as: COLACE Take 1 capsule (100 mg total) by mouth daily. (May buy from over the counter): For constipation Start taking on: July 15, 2020  Indication: Constipation   gabapentin 300 MG capsule Commonly known as: NEURONTIN Take 1 capsule (300 mg total) by mouth 3 (three) times daily. For agitation What changed:   medication strength  how much to take  how to take this  when to take this  additional instructions  Indication: Agitation   hydrOXYzine 25 MG tablet Commonly known as: ATARAX/VISTARIL Take 1 tablet (25 mg total) by mouth 3 (three) times daily as needed for anxiety.  Indication: Feeling Anxious   nicotine 21 mg/24hr patch Commonly known as: NICODERM CQ - dosed in mg/24 hours Place 1 patch (21 mg total) onto the skin daily. (May buy from over the counter): For nicotine withdrawal Start taking on: July 15, 2020  Indication: Nicotine  Addiction   omeprazole 20 MG tablet Commonly known as: PRILOSEC OTC Take 20 mg by mouth daily.  Indication: Acid reflux   ondansetron 4 MG disintegrating tablet Commonly known as: Zofran ODT Take 1 tablet (4 mg total) by mouth every 8 (eight) hours as needed for nausea or vomiting.  Indication: Nausea and Vomiting   prazosin 2 MG capsule Commonly known as: MINIPRESS Take 1 capsule (2 mg total) by mouth at bedtime. For nightmares What changed:   how much to take  how to take this  when to take this  additional instructions  Indication: Frightening Dreams   sertraline 50 MG tablet Commonly known as: ZOLOFT Take 1 tablet (50 mg total) by mouth at bedtime. For depression What changed:   how much to take  when to take this  additional instructions  Indication: Major Depressive Disorder   traZODone 50 MG tablet Commonly known as: DESYREL Take 1 tablet (50 mg total) by mouth at bedtime as needed for sleep.  Indication: Trouble Sleeping       Follow-up Information    [facility]. Go on 07/16/2020.   Specialty: Urgent Care Why: You have an appointment for medication management on 07/16/20 at 11:40 am.  You also have an appointment to start substance abuse intensive outpatient therapy services on 07/28/20 at 1:00 pm. These appointments will be held in person.  Contact information: [address] [place] [address] [phone]             Follow-up recommendations: Activity:  As tolerated Diet: As recommended by your primary care doctor. Keep all scheduled follow-up appointments as recommended.   Comments: Prescriptions given at discharge.  Patient agreeable to plan.  Given opportunity to ask questions.  Appears to feel comfortable with discharge denies any current suicidal or homicidal thought. Patient is also instructed prior to discharge to: Take all medications as prescribed by his/her mental healthcare provider. Report any  adverse effects and or reactions from the medicines to his/her outpatient provider promptly. Patient has been instructed & cautioned: To not engage in alcohol and or illegal drug use while on prescription medicines. In the event of worsening symptoms, patient is instructed to call the crisis hotline, 911 and or go to the nearest ED for appropriate evaluation and treatment of symptoms. To follow-up with his/her primary care provider for your other medical issues, concerns and or health care needs.  Signed: Armandina Stammer, NP, PMHNP, FNP-BC 07/14/2020, 10:10 AM

## 2020-07-14 NOTE — [facility] Suicide Risk Assessment (Signed)
[facility] INPATIENT:  Family/Significant Other Suicide Prevention Education  Suicide Prevention Education:  Education Completed; w girlfriend [name] [phone],  (name of family member/significant other) has been identified by the patient as the family member/significant other with whom the patient will be residing, and identified as the person(s) who will aid the patient in the event of a mental health crisis (suicidal ideations/suicide attempt).  With written consent from the patient, the family member/significant other has been provided the following suicide prevention education, prior to the and/or following the discharge of the patient.  The suicide prevention education provided includes the following:  Suicide risk factors  Suicide prevention and interventions  National Suicide Hotline telephone number  [facility] assessment telephone number  [facility] Emergency Assistance 911  [facility] and/or Residential Mobile Crisis Unit telephone number  Request made of family/significant other to:  Remove weapons (e.g., guns, rifles, knives), all items previously/currently identified as safety concern.    Remove drugs/medications (over-the-counter, prescriptions, illicit drugs), all items previously/currently identified as a safety concern.  The family member/significant other verbalizes understanding of the suicide prevention education information provided.  The family member/significant other agrees to remove the items of safety concern listed above.  When CSW asked what led pt to the hospital Ms. Littreal stated, "The last few weeks her depression and anxiety have been declining and she didn't have a way to get her medications. Fridays she was upset and then she became irritable. She stopped all communication with me and then all of a sudden she started texting me saying how she had access to a gun. She has never tried that before so I called 911. The pt told  the officer that she was fine but then she texted me and said she took a bottle of pills. The officer then took her to the hospital.". No safety concerns at this time. Ms. Inocencio Homes states that she spoke with pt's parents yesterday about concealing the weapon in their home. Ms. Hortencia Pilar states that pt was going to weekly groups on Tuesdays prior to being admitted and that she plans to continue to do so post-discharge.   Felizardo Hoffmann 07/14/2020, 10:14 AM

## 2020-07-14 NOTE — Progress Notes (Signed)
Recreation Therapy Notes  Date: 4.6.22 Time: 0930 Location: [address] Day[address]  Group Topic: Stress Management   Goal Area(s) Addresses:  Patient will actively participate in stress management techniques presented during session.  Patient will successfully identify benefit of practicing stress management post d/c.   Intervention: Guided exercise with ambient sound and script  Activity :Guided Imagery  LRT read a script that lead patients to envision floating on a cloud and going to any place they desire.  Patients were to listen to follow along as script was read to fully participate in activity.  Education:  Stress Management, Discharge Planning.   Education Outcome: Acknowledges education  Clinical Observations/Feedback: Pt did not attend group session.    Caroll Rancher, LRT/CTRS         Lillia Abed, Daevon Holdren A 07/14/2020 11:11 AM

## 2020-07-14 NOTE — Progress Notes (Signed)
  [facility] Adult Case Management Discharge Plan :  Will you be returning to the same living situation after discharge:  Yes,  personal home At discharge, do you have transportation home?: Yes,  via girlfriend Do you have the ability to pay for your medications: No.  Release of information consent forms completed and in the chart;  Patient's signature needed at discharge.  Patient to Follow up at:  Follow-up Information    [facility]. Go on 07/16/2020.   Specialty: Urgent Care Why: You have an appointment for medication management on 07/16/20 at 11:40 am.  You also have an appointment to start substance abuse intensive outpatient therapy services on 07/28/20 at 1:00 pm. These appointments will be held in person.  Contact information: [address] [place] [address] [phone]              Next level of care provider has access to [facility] Link:yes  Safety Planning and Suicide Prevention discussed: Yes,  w/ girlfriend  Have you used any form of tobacco in the last 30 days? (Cigarettes, Smokeless Tobacco, Cigars, and/or Pipes): Yes  Has patient been referred to the Quitline?: Patient refused referral  Patient has been referred for addiction treatment: Yes SAIOP  Felizardo Hoffmann, LCSWA 07/14/2020, 10:23 AM

## 2020-07-14 NOTE — BHH Suicide Risk Assessment (Signed)
[facility] Discharge Suicide Risk Assessment   Principal Problem: Major depressive disorder, recurrent severe without psychotic features [facility]) Discharge Diagnoses: Principal Problem:   Major depressive disorder, recurrent severe without psychotic features (HCC)   Total Time spent with patient: 20 minutes  Musculoskeletal: Strength & Muscle Tone: within normal limits Gait & Station: normal Patient leans: N/A  Psychiatric Specialty Exam: Review of Systems  Constitutional: Negative for chills and fever.  HENT: Negative for sore throat.   Eyes: Negative for discharge.  Respiratory: Negative for cough and shortness of breath.   Cardiovascular: Negative for chest pain and palpitations.  Gastrointestinal: Negative for diarrhea, nausea and vomiting.  Endocrine: Negative for polyuria.  Genitourinary: Negative for difficulty urinating.  Musculoskeletal: Negative for arthralgias and myalgias.  Neurological: Negative for dizziness and light-headedness.  Psychiatric/Behavioral: Negative for dysphoric mood, hallucinations, self-injury and suicidal ideas.    Blood pressure 121/88, pulse (!) 58, temperature 98.5 F (36.9 C), temperature source Oral, resp. rate 18, height 5\' 2"  (1.575 m), weight 58.5 kg, SpO2 100 %.Body mass index is 23.59 kg/m.  General Appearance: Casual and Neat  Eye Contact::  Good  Speech:  Clear and Coherent and Normal Rate  Volume:  Normal  Mood:  Anxious to be discharged  Affect:  Appropriate and Full Range  Thought Process:  Coherent, Goal Directed and Linear  Orientation:  Full (Time, Place, and Person)  Thought Content:  Logical  Suicidal Thoughts:  No  Homicidal Thoughts:  No  Memory:  Immediate;   Good Recent;   Good Remote;   Good  Judgement:  Good  Insight:  Good  Psychomotor Activity:  Normal  Concentration:  Good  Recall:  Good  Fund of Knowledge:Good  Language: Good  Akathisia:  No    AIMS (if indicated):    N/A  Assets:  Communication Skills Desire  for Improvement Housing Physical Health Resilience Social Support Vocational/Educational  Sleep:  Number of Hours: 5  Cognition: WNL  ADL's:  Intact   Mental Status Per Nursing Assessment::   On Admission:  Suicidal ideation indicated by patient,Suicidal ideation indicated by others  Demographic Factors:  Caucasian and Lesbian relationship  Loss Factors: conflict with partner  Historical Factors: Prior suicide attempts, history of abuse, previous psychiatric hospitalizations, history of substance abuse  Risk Reduction Factors:   Employed, Positive social support and Positive coping skills or problem solving skills  Continued Clinical Symptoms:  Anxiety Depression:   Impulsivity More than one psychiatric diagnosis Previous Psychiatric Diagnoses and Treatments  Cognitive Features That Contribute To Risk:  Polarized thinking    Suicide Risk:  Mild:  Suicidal ideation of limited frequency, intensity, duration, and specificity.  There are no identifiable plans, no associated intent, mild dysphoria and related symptoms, good self-control (both objective and subjective assessment), few other risk factors, and identifiable protective factors, including available and accessible social support.   Follow-up Information    [facility]. Go on 07/16/2020.   Specialty: Urgent Care Why: You have an appointment for medication management on 07/16/20 at 11:40 am.  You also have an appointment to start substance abuse intensive outpatient therapy services on 07/28/20 at 1:00 pm. These appointments will be held in person.  Contact information: [phone] [place] [phone]              Plan Of Care/Follow-up recommendations:  Activity:  as tolerated Other:   Take medications as prescribed.   Do not take any medications that are not prescribed  for you.  Do not drink alcohol.   Do not use marijuana or other drugs.  Keep follow up  appointments with therapist and outpatient psychiatrist.  Go to 12 step groups and get sponsor to help refrain from using alcohol or substances and strongly consider entering into outpatient or residential substance use disorder treatment program  Claudie Revering, MD 07/14/2020, 9:48 AM

## 2020-07-14 NOTE — Progress Notes (Signed)
Adult Psychoeducational Group Note  Date:  07/14/2020 Time:  12:00 PM  Group Topic/Focus:  Goals Group:   The focus of this group is to help patients establish daily goals to achieve during treatment and discuss how the patient can incorporate goal setting into their daily lives to aide in recovery.  Participation Level:  Active  Participation Quality:  Appropriate  Affect:  Appropriate  Cognitive:  Alert  Insight: Appropriate, Good and Improving  Engagement in Group:  Engaged  Modes of Intervention:  Discussion  Additional Comments:  Pt attended group and participated in discussion.  Shaheim Mahar R Kenyotta Dorfman 07/14/2020, 12:00 PM

## 2020-07-15 ENCOUNTER — Other Ambulatory Visit: Payer: Self-pay

## 2020-07-16 ENCOUNTER — Other Ambulatory Visit: Payer: Self-pay

## 2020-07-16 ENCOUNTER — Encounter (HOSPITAL_COMMUNITY): Payer: Self-pay | Admitting: Psychiatry

## 2020-07-16 ENCOUNTER — Telehealth (INDEPENDENT_AMBULATORY_CARE_PROVIDER_SITE_OTHER): Payer: No Payment, Other | Admitting: Psychiatry

## 2020-07-16 DIAGNOSIS — F1321 Sedative, hypnotic or anxiolytic dependence, in remission: Secondary | ICD-10-CM | POA: Diagnosis not present

## 2020-07-16 DIAGNOSIS — F3341 Major depressive disorder, recurrent, in partial remission: Secondary | ICD-10-CM

## 2020-07-16 DIAGNOSIS — F1421 Cocaine dependence, in remission: Secondary | ICD-10-CM

## 2020-07-16 DIAGNOSIS — F419 Anxiety disorder, unspecified: Secondary | ICD-10-CM

## 2020-07-16 DIAGNOSIS — F4310 Post-traumatic stress disorder, unspecified: Secondary | ICD-10-CM | POA: Diagnosis not present

## 2020-07-16 DIAGNOSIS — F1121 Opioid dependence, in remission: Secondary | ICD-10-CM

## 2020-07-16 MED ORDER — SERTRALINE HCL 50 MG PO TABS
50.0000 mg | ORAL_TABLET | Freq: Every day | ORAL | 1 refills | Status: DC
  Filled 2020-07-16: qty 30, 30d supply, fill #0
  Filled 2020-08-24: qty 30, 30d supply, fill #1

## 2020-07-16 MED ORDER — PRAZOSIN HCL 2 MG PO CAPS
2.0000 mg | ORAL_CAPSULE | Freq: Every day | ORAL | 1 refills | Status: DC
  Filled 2020-07-16: qty 30, 30d supply, fill #0
  Filled 2020-08-24: qty 30, 30d supply, fill #1

## 2020-07-16 MED ORDER — GABAPENTIN 400 MG PO CAPS
400.0000 mg | ORAL_CAPSULE | Freq: Three times a day (TID) | ORAL | 1 refills | Status: DC
  Filled 2020-07-16: qty 90, 30d supply, fill #0
  Filled 2020-08-24: qty 90, 30d supply, fill #1

## 2020-07-16 MED ORDER — TRAZODONE HCL 50 MG PO TABS
50.0000 mg | ORAL_TABLET | Freq: Every evening | ORAL | 1 refills | Status: DC | PRN
  Filled 2020-07-16: qty 30, 30d supply, fill #0

## 2020-07-16 MED ORDER — HYDROXYZINE HCL 25 MG PO TABS
25.0000 mg | ORAL_TABLET | Freq: Three times a day (TID) | ORAL | 1 refills | Status: DC | PRN
  Filled 2020-07-16: qty 90, 30d supply, fill #0
  Filled 2020-08-24: qty 90, 30d supply, fill #1

## 2020-07-16 NOTE — Progress Notes (Signed)
BH OP Progress Note   Virtual Visit via Video Note  I connected with [name] on 07/16/20 at 11:40 AM EDT by a video enabled telemedicine application and verified that I am speaking with the correct person using two identifiers.  Location: Patient: Home Provider: Clinic   I discussed the limitations of evaluation and management by telemedicine and the availability of in person appointments. The patient expressed understanding and agreed to proceed.  I provided 19 minutes of non-face-to-face time during this encounter.     Patient Identification: [name] MRN:  [id number] Date of Evaluation:  07/16/2020   Chief Complaint:   "I feel a whole lot better today."  Visit Diagnosis:    ICD-10-CM   1. Recurrent major depressive disorder, in partial remission (HCC)  F33.41 sertraline (ZOLOFT) 50 MG tablet    traZODone (DESYREL) 50 MG tablet    gabapentin (NEURONTIN) 400 MG capsule  2. PTSD (post-traumatic stress disorder)  F43.10 prazosin (MINIPRESS) 2 MG capsule  3. Anxiety  F41.9 hydrOXYzine (ATARAX/VISTARIL) 25 MG tablet    sertraline (ZOLOFT) 50 MG tablet    gabapentin (NEURONTIN) 400 MG capsule  4. 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 Opioid use disorder, moderate, in sustained remission (HCC)  F11.21   6. Cocaine use disorder, moderate, in sustained remission (HCC)  F14.21     History of Present Illness: Patient was recently hospitalized at [facility] inpatient unit after being IVC petitioned after she overdosed on BuSpar at home and also had suicidal ideations to shoot herself.  At the time of admission her UDS was positive for benzodiazepines and marijuana. Patient was discharged recently on April 6. Patient stated that she was tapered off the benzos during her hospital stay and today she feels much clear in her head.  She stated that she is able to see anything clearly. She stated  that she feels like a new person and feels refreshed. She stated that the psychiatrist at the hospital gave her time up until Monday however her boss has been calling her and asking her to return back to work tomorrow.  She stated that she will probably return back on Monday and take this week and often rest. She stated that she is hoping that she will never go back to Xanax again.  She was to stay away from any sort of illicit substances now. She informed that she had seen Ms. [name] for SA IOP however she could not attend those sessions due to her work schedule. She was agreeable to see her again for outpatient therapy sessions.  She stated that she is not feeling depressed anymore and denied having any suicidal ideations anymore.  She feels her mood is stable.  She stated that she does not want to take buspirone anymore and she is surprised why it discharged her on some tablets for that.  She asked if she could take as needed hydroxyzine for anxiety.  She would like to continue the same dose of gabapentin 400 mg which was prescribed by the writer.  We will continue the prazosin, sertraline and trazodone for sleep.    Past Psychiatric History: MDD, PTSD, polysubstance abuse  Past Medical History:  Past Medical History:  Diagnosis Date  . Anxiety   . Benzodiazepine abuse (HCC)   . Bipolar 1 disorder (HCC)   . Depression   . Drug abuse (HCC)   . ETOH abuse   . Polysubstance  abuse (HCC)    No past surgical history on file.  Family Psychiatric History: denied  Family History: No family history on file.  Social History:   Social History   Socioeconomic History  . Marital status: Single    Spouse name: Not on file  . Number of children: Not on file  . Years of education: Not on file  . Highest education level: Not on file  Occupational History  . Not on file  Tobacco Use  . Smoking status: Current Every Day Smoker    Packs/day: 2.00    Years: 17.00    Pack years: 34.00     Types: Cigarettes  . Smokeless tobacco: Never Used  Vaping Use  . Vaping Use: Never used  Substance and Sexual Activity  . Alcohol use: Yes    Comment: +63  . Drug use: Yes    Types: Marijuana, IV    Comment: UDS + THC  . Sexual activity: Never  Other Topics Concern  . Not on file  Social History Narrative  . Not on file   Social Determinants of Health   Financial Resource Strain: Not on file  Food Insecurity: Not on file  Transportation Needs: Not on file  Physical Activity: Not on file  Stress: Not on file  Social Connections: Not on file     Allergies:   Allergies  Allergen Reactions  . Haldol [Haloperidol Lactate] Other (See Comments)    Makes whole body stiff    Metabolic Disorder Labs: Lab Results  Component Value Date   HGBA1C 5.2 07/12/2020   MPG 102.54 07/12/2020   No results found for: PROLACTIN Lab Results  Component Value Date   CHOL 123 07/12/2020   TRIG 56 07/12/2020   HDL 39 (L) 07/12/2020   CHOLHDL 3.2 07/12/2020   VLDL 11 07/12/2020   LDLCALC 73 07/12/2020   Lab Results  Component Value Date   TSH 0.906 07/12/2020    Therapeutic Level Labs: No results found for: LITHIUM Lab Results  Component Value Date   CBMZ <2.0 (L) 11/21/2016   Lab Results  Component Value Date   VALPROATE 74.4 04/26/2010    Current Medications: Current Outpatient Medications  Medication Sig Dispense Refill  . gabapentin (NEURONTIN) 400 MG capsule Take 1 capsule (400 mg total) by mouth 3 (three) times daily. 90 capsule 1  . dicyclomine (BENTYL) 20 MG tablet Take 1 tablet (20 mg total) by mouth 2 (two) times daily. For IBS 14 tablet 0  . docusate sodium (COLACE) 100 MG capsule Take 1 capsule (100 mg total) by mouth daily. (May buy from over the counter): For constipation 1 capsule 0  . hydrOXYzine (ATARAX/VISTARIL) 25 MG tablet Take 1 tablet (25 mg total) by mouth 3 (three) times daily as needed for anxiety. 90 tablet 1  . nicotine (NICODERM CQ - DOSED IN  MG/24 HOURS) 21 mg/24hr patch Place 1 patch (21 mg total) onto the skin daily. (May buy from over the counter): For nicotine withdrawal 28 patch 0  . omeprazole (PRILOSEC OTC) 20 MG tablet Take 20 mg by mouth daily.    . ondansetron (ZOFRAN ODT) 4 MG disintegrating tablet Take 1 tablet (4 mg total) by mouth every 8 (eight) hours as needed for nausea or vomiting. 20 tablet 0  . prazosin (MINIPRESS) 2 MG capsule Take 1 capsule (2 mg total) by mouth at bedtime. For nightmares 30 capsule 1  . sertraline (ZOLOFT) 50 MG tablet Take 1 tablet (50 mg total) by mouth  at bedtime. For depression 30 tablet 1  . traZODone (DESYREL) 50 MG tablet Take 1 tablet (50 mg total) by mouth at bedtime as needed for sleep. 30 tablet 1   No current facility-administered medications for this visit.    Psychiatric Specialty Exam: Review of Systems  There were no vitals taken for this visit.There is no height or weight on file to calculate BMI.  General Appearance: Fairly Groomed  Eye Contact:  Good  Speech:  Clear and Coherent and Normal Rate  Volume:  Normal  Mood: Less Depressed  Affect:  Congruent  Thought Process:  Goal Directed and Descriptions of Associations: Intact  Orientation:  Full (Time, Place, and Person)  Thought Content:  Logical  Suicidal Thoughts:  No  Homicidal Thoughts:  No  Memory:  Immediate;   Good Recent;   Good  Judgement:  Fair  Insight:  Fair  Psychomotor Activity:  Normal  Concentration:  Concentration: Good and Attention Span: Good  Recall:  Good  Fund of Knowledge:Good  Language: Good  Akathisia:  Negative  Handed:  Right  AIMS (if indicated):  Not done  Assets:  Communication Skills Desire for Improvement Financial Resources/Insurance Housing  ADL's:  Intact  Cognition: WNL  Sleep:  Fair   Screenings: AIMS   Flowsheet Row [facility]) [facility]  AIMS Total Score 0 0    AUDIT   Flowsheet Row [facility]) [facility] (Dis[facility] Ad[facility] (Dis[facility] ([facility]  Alcohol Use Disorder Identification Test Final Score (AUDIT) 2 22 29  33    GAD-7   Flowsheet Row [facility]  Total GAD-7 Score 11 9 10     PHQ2-9   Flowsheet Row [facility] from 02/24/2016 [facility]  PHQ-2 Total Score 0 2 2 2   PHQ-9 Total Score -- 9 9 6     Flowsheet Row [facility]  C-SSRS RISK CATEGORY Error: Q3, 4, or 5 should not be populated when Q2 is No Low Risk High Risk      Assessment and Plan: Patient was recently discharged from the hospital after being admitted there under IVC petition for suicidal ideations and suicidal attempt by overdosing on BuSpar.  1. Recurrent major depressive disorder, in partial remission (HCC)  - sertraline (ZOLOFT) 50 MG tablet; Take 1 tablet (50 mg total) by mouth at bedtime. For depression  Dispense: 30 tablet; Refill: 1 - traZODone (DESYREL) 50 MG tablet; Take 1 tablet  (50 mg total) by mouth at bedtime as needed for sleep.  Dispense: 30 tablet; Refill: 1 - gabapentin (NEURONTIN) 400 MG capsule; Take 1 capsule (400 mg total) by mouth 3 (three) times daily.  Dispense: 90 capsule; Refill: 1  2. PTSD (post-traumatic stress disorder)  - prazosin (MINIPRESS) 2 MG capsule; Take 1 capsule (2 mg total) by mouth at bedtime. For nightmares  Dispense: 30 capsule; Refill: 1  3. Anxiety  - hydrOXYzine (ATARAX/VISTARIL) 25 MG tablet; Take 1 tablet (25 mg total) by mouth 3 (three) times daily as needed for anxiety.  Dispense: 90 tablet; Refill: 1 - sertraline (ZOLOFT) 50 MG tablet; Take 1 tablet (50 mg total) by mouth at bedtime. For depression  Dispense: 30 tablet; Refill: 1 - gabapentin (NEURONTIN) 400 MG capsule; Take 1 capsule (400 mg total) by mouth 3 (three) times daily.  Dispense: 90 capsule; Refill: 1  4. 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  Dispense: 90 capsule; Refill: 1  5. Opioid use disorder, moderate, in sustained remission (HCC)   6. Cocaine use disorder, moderate, in sustained remission [facility])   Writer will have the front desk staff contact her with Ms. [name] for out-pt therapy services. Patient was advised to stay away from any illicit substances. Follow-up in 6 weeks.    [name], MD 4/8/202212:12 PM

## 2020-07-19 ENCOUNTER — Ambulatory Visit (HOSPITAL_COMMUNITY): Payer: No Payment, Other | Admitting: Behavioral Health

## 2020-07-20 ENCOUNTER — Other Ambulatory Visit: Payer: Self-pay

## 2020-07-21 ENCOUNTER — Other Ambulatory Visit: Payer: Self-pay

## 2020-07-21 ENCOUNTER — Encounter: Payer: Self-pay | Admitting: Physician Assistant

## 2020-07-21 ENCOUNTER — Ambulatory Visit: Payer: Self-pay | Attending: Physician Assistant | Admitting: Physician Assistant

## 2020-07-21 DIAGNOSIS — K589 Irritable bowel syndrome without diarrhea: Secondary | ICD-10-CM

## 2020-07-21 DIAGNOSIS — F3341 Major depressive disorder, recurrent, in partial remission: Secondary | ICD-10-CM

## 2020-07-21 DIAGNOSIS — F1910 Other psychoactive substance abuse, uncomplicated: Secondary | ICD-10-CM

## 2020-07-21 DIAGNOSIS — Z09 Encounter for follow-up examination after completed treatment for conditions other than malignant neoplasm: Secondary | ICD-10-CM

## 2020-07-21 MED ORDER — DICYCLOMINE HCL 20 MG PO TABS
20.0000 mg | ORAL_TABLET | Freq: Two times a day (BID) | ORAL | 3 refills | Status: DC
  Filled 2020-07-21: qty 30, 15d supply, fill #0

## 2020-07-21 NOTE — Progress Notes (Signed)
Patient ID: [name], female   DOB: 1980/08/06, 40 y.o.   MRN: [id number]  Virtual Visit via Telephone Note  I connected with [name] on 07/21/20 at  4:10 PM EDT by telephone and verified that I am speaking with the correct person using two identifiers.  Location: Patient: home Provider: [facility] office   I discussed the limitations, risks, security and privacy concerns of performing an evaluation and management service by telephone and the availability of in person appointments. I also discussed with the patient that there may be a patient responsible charge related to this service. The patient expressed understanding and agreed to proceed.   History of Present Illness: After IVC and subsequent BH hospitalization 4/3 to 07/14/2020.  She did see Dr Evelene Croon in follow up on 07/16/2020.  She is doing well.  No further SI/HI.  She feels hopeful about the future.  She has been in and out of AA and NA over the years.  Not currently attending.  Not actively using but not active in recovery either  From discharge summary: Date of Admission:  07/11/2020  Date of Discharge: 07-14-20  Reason for Admission: Expression of suicidal thoughts to shoot herself & that she had overdosed on Buspar.   Principal Problem: Major depressive disorder, recurrent severe without psychotic features [facility])  Discharge Diagnoses: Principal Problem:   Major depressive disorder, recurrent severe without psychotic features (HCC)  Past Psychiatric History: Major depressive disorder, recurrent.  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 She was reportedly brought in by EMS to the ED after her female partner IVCed her for expressing thoughts to shoot herself and after  reporting that she had overdosed on Buspar. The patient states that she and her girlfriend have been arguing, and in the context of alcohol use she made suicidal statements to her partner. The patient admits that she took "a few extra Buspar" but states it was not a suicide attempt. She admits that she told her girlfriend she wanted to hurt herself in the context of stress in their relationship. She states that she has had "a lot" of previous suicide attempts and has had multiple previous psychiatric inpatient admissions. The last psychiatric admission was at [facility] in 2018. She is followed by Dr. Evelene Croon as an outpatient and admits that in the last few weeks she did not refill her Zoloft 50mg . She states that for the last several years she has suffered with depressive symptoms and in the last 2 weeks has been having erratic sleep cycles, low energy, poor focus, low appetite, guilt, and anhedonia. She denies current SI, intent or plan and denies HI. She denies h/o mania or hypomania. She denies h/o psychosis or paranoia. She reports a past h/o PTSD related to childhood traumas that she does not wish to discuss. She is supposed to be taking Prazosin 2mg  to help with PTSD related nightmares, Neurontin 400mg  tid to help with polysubstance use and anxiety, and Buspar 15mg  tid to help with anxiety. She states her cocaine and opiate abuse issues are in sustained remission and she is vague as to her pattern of alcohol use prior to admission. She admits to using Xanax 1-5mg  daily that she obtains from friends to "self-medicate" for her residual anxiety.She states she previously was prescribed Xanax but is unsure  when she last had it filled. She denies current withdrawal symptoms or cravings for substances. See H&P for additional admission details.   This is one of several psychiatric admissions/discharge summaries from the [facility] systems for this 40 year old AA female. She is with hx of chronic mental  illness, PTSD, multiple suicide attempts, polysubstance substance use disorders & multiple psychiatric admissions. She is well known in this [facility] & in other psychiatric hospitals within the area for similar complaints. Deanna has over the years been tried on multiple psychotropic medications for her symptoms & it appeared she may not been compliant with her recommended treatment regimen in parts due to substance abuse/use issues. She was brought to the hospital this time around for evaluation & treatment for making suicidal threats & reported an attempt by overdose on Buspar. She cited as her trigger stress from her relationship.  After evaluation of her presenting symptoms this time, Makynzie was recommended for mood stabilization treatments. The medication regimen for her presenting symptoms were discussed & with her consent initiated. She received, stabilized & was discharged on the medications as listed below on her discharge medication lists. She was also enrolled & participated in the group counseling sessions being offered & held on this unit. She learned coping skills. She presented on this admission, other chronic medical conditions that required treatment & monitoring. She was resumed, treated & discharged on all her pertinent home medications for those pre-existing health issues. She tolerated her treatment regimen without any adverse effects or reactions reported. Her symptoms responded well to her treatment regimen warranting this discharge.  During the course of this present hospitalization, the 15-minute checks were adequate to ensure Carry's safety.  Patient did not display any dangerous, violent or suicidal behavior on the unit. She interacted with patients & staff appropriately, participated appropriately in the group sessions/therapies. Her medications were addressed & adjusted to meet her needs. She was recommended for outpatient follow-up care & medication management upon discharge to  assure her continuity of care.  At the time of discharge, patient is not reporting any acute suicidal/homicidal ideations. She currently denies any new issues or concerns. Education and supportive counseling provided throughout her hospital stay & upon discharge.  Today upon her discharge evaluation with the attending psychiatrist, Markisha shares she is doing well. She denies any other specific concerns. She is sleeping well. Her appetite is good. She denies other physical complaints. She denies AH/VH, delusional thoughts or paranoia. She feels that her medications have been helpful & is in agreement to continue her current treatment regimen as recommended.She was able to engage in safety planning including plan to return to [facility] or contact emergency services if she feels unable to maintain her own safety or the safety of others. Pt had no further questions, comments, or concerns.She left [facility] with all personal belongings in no apparent distress. Transportation per girlfriend.  Observations/Objective:  NAD.  A&Ox3.  Denies SI/HI.     Assessment and Plan: 1. Recurrent major depressive disorder, in partial remission (HCC) Continue current regimen and f/up with Dr Evelene Croon  2. Encounter for examination following treatment at hospital  3. Irritable bowel syndrome, unspecified type - dicyclomine (BENTYL) 20 MG tablet; Take 1 tablet (20 mg total) by mouth 2 (two) times daily. For IBS prn  Dispense: 30 tablet; Refill: 3  4. Substance abuse (HCC) [employer].org is the website for narcotics anonymous [employer].cy (website) or 667 579 3897 is the information for alcoholics anonymou Both are free and immediately available for help  with alcohol and drug use I have counseled the patient at length about substance abuse and addiction.  12 step meetings/recovery recommended.  Local 12 step meeting lists were given and attendance was encouraged.  Patient expresses understanding.    Follow Up Instructions: Assign  PCP in about 6-8 weeks   I discussed the assessment and treatment plan with the patient. The patient was provided an opportunity to ask questions and all were answered. The patient agreed with the plan and demonstrated an understanding of the instructions.   The patient was advised to call back or seek an in-person evaluation if the symptoms worsen or if the condition fails to improve as anticipated.  I provided 16 minutes of non-face-to-face time during this encounter.   Georgian Co, PA-C

## 2020-07-26 ENCOUNTER — Other Ambulatory Visit: Payer: Self-pay

## 2020-07-27 ENCOUNTER — Ambulatory Visit (HOSPITAL_COMMUNITY): Payer: No Payment, Other | Admitting: Behavioral Health

## 2020-07-28 ENCOUNTER — Ambulatory Visit (HOSPITAL_COMMUNITY): Payer: No Payment, Other | Admitting: Behavioral Health

## 2020-08-24 ENCOUNTER — Other Ambulatory Visit: Payer: Self-pay

## 2020-08-26 ENCOUNTER — Other Ambulatory Visit: Payer: Self-pay

## 2020-08-27 ENCOUNTER — Other Ambulatory Visit: Payer: Self-pay

## 2020-08-27 ENCOUNTER — Encounter (HOSPITAL_COMMUNITY): Payer: Self-pay | Admitting: Psychiatry

## 2020-08-27 ENCOUNTER — Telehealth (INDEPENDENT_AMBULATORY_CARE_PROVIDER_SITE_OTHER): Payer: No Payment, Other | Admitting: Psychiatry

## 2020-08-27 DIAGNOSIS — F419 Anxiety disorder, unspecified: Secondary | ICD-10-CM | POA: Diagnosis not present

## 2020-08-27 DIAGNOSIS — F3341 Major depressive disorder, recurrent, in partial remission: Secondary | ICD-10-CM

## 2020-08-27 DIAGNOSIS — F1421 Cocaine dependence, in remission: Secondary | ICD-10-CM

## 2020-08-27 DIAGNOSIS — F1320 Sedative, hypnotic or anxiolytic dependence, uncomplicated: Secondary | ICD-10-CM

## 2020-08-27 DIAGNOSIS — F4310 Post-traumatic stress disorder, unspecified: Secondary | ICD-10-CM

## 2020-08-27 DIAGNOSIS — F1121 Opioid dependence, in remission: Secondary | ICD-10-CM

## 2020-08-27 MED ORDER — PRAZOSIN HCL 2 MG PO CAPS
2.0000 mg | ORAL_CAPSULE | Freq: Every day | ORAL | 2 refills | Status: DC
  Filled 2020-08-27 – 2020-09-17 (×3): qty 30, 30d supply, fill #0

## 2020-08-27 MED ORDER — SERTRALINE HCL 50 MG PO TABS
50.0000 mg | ORAL_TABLET | Freq: Every day | ORAL | 2 refills | Status: DC
  Filled 2020-08-27 – 2020-09-17 (×2): qty 30, 30d supply, fill #0

## 2020-08-27 MED ORDER — TRAZODONE HCL 50 MG PO TABS
50.0000 mg | ORAL_TABLET | Freq: Every evening | ORAL | 2 refills | Status: DC | PRN
  Filled 2020-08-27: qty 30, 30d supply, fill #0

## 2020-08-27 MED ORDER — GABAPENTIN 400 MG PO CAPS
400.0000 mg | ORAL_CAPSULE | Freq: Three times a day (TID) | ORAL | 2 refills | Status: DC
  Filled 2020-08-27 – 2020-09-17 (×2): qty 90, 30d supply, fill #0

## 2020-08-27 MED ORDER — HYDROXYZINE HCL 25 MG PO TABS
25.0000 mg | ORAL_TABLET | Freq: Three times a day (TID) | ORAL | 2 refills | Status: DC | PRN
  Filled 2020-08-27 – 2020-09-17 (×2): qty 90, 30d supply, fill #0

## 2020-08-27 NOTE — Progress Notes (Signed)
BH OP Progress Note   Virtual Visit via Telephone Note  I connected with Denise Hashimotohristie Lynn Corpening on 08/27/20 at 11:20 AM EDT by telephone and verified that I am speaking with the correct person using two identifiers.  Location: Patient: home Provider: Clinic   I discussed the limitations, risks, security and privacy concerns of performing an evaluation and management service by telephone and the availability of in person appointments. I also discussed with the patient that there may be a patient responsible charge related to this service. The patient expressed understanding and agreed to proceed.   I provided 13 minutes of non-face-to-face time during this encounter.    Patient Identification: [name] MRN:  [id number] Date of Evaluation:  08/27/2020   Chief Complaint:   "It's okay I guess."  Visit Diagnosis:    ICD-10-CM   1. Recurrent major depressive disorder, in partial remission (HCC)  F33.41 gabapentin (NEURONTIN) 400 MG capsule    sertraline (ZOLOFT) 50 MG tablet    traZODone (DESYREL) 50 MG tablet  2. PTSD (post-traumatic stress disorder)  F43.10 prazosin (MINIPRESS) 2 MG capsule  3. Anxiety  F41.9 gabapentin (NEURONTIN) 400 MG capsule    hydrOXYzine (ATARAX/VISTARIL) 25 MG tablet    sertraline (ZOLOFT) 50 MG tablet  4. Benzodiazepine dependence, continuous (HCC)  F13.20   5. Opioid use disorder, moderate, in sustained remission (HCC)  F11.21   6. Cocaine use disorder, moderate, in sustained remission (HCC)  F14.21     History of Present Illness: Patient stated that she had to be forgotten about this appointment and that is why she did not answer the video call link.  She did answer her phone though.  She stated that she was sleeping. She stated that she has been doing okay and has been very busy with her work schedule.  She stated that she has still been using Xanax because she believes that she cannot function without it.  She still uses it but reports that  she is not using it as often as she used to. She has not used any cocaine or pain pills or any other forms of illicit substances.  She stated that she is not sure if she can really quit Xanax at any point. She no showed for her appointment with Ms. Shaleta last month.  She continues to be ambivalent about quitting benzodiazepines and at present did not show any active intent to do so.  Writer advised her to cut down and minimize the use as much as she can, patient said she will try. She denied mixing it with alcohol.  She denied any other concerns and stated that she can continue taking her same regimen for now.    Past Psychiatric History: MDD, PTSD, polysubstance abuse  Past Medical History:  Past Medical History:  Diagnosis Date  . Anxiety   . Benzodiazepine abuse (HCC)   . Bipolar 1 disorder (HCC)   . Depression   . Drug abuse (HCC)   . ETOH abuse   . Polysubstance abuse (HCC)    No past surgical history on file.  Family Psychiatric History: denied  Family History: No family history on file.  Social History:   Social History   Socioeconomic History  . Marital status: Single    Spouse name: Not on file  . Number of children: Not on file  . Years of education: Not on file  . Highest education level: Not on file  Occupational History  . Not on file  Tobacco Use  .  Smoking status: Current Every Day Smoker    Packs/day: 2.00    Years: 17.00    Pack years: 34.00    Types: Cigarettes  . Smokeless tobacco: Never Used  Vaping Use  . Vaping Use: Never used  Substance and Sexual Activity  . Alcohol use: Yes    Comment: +63  . Drug use: Yes    Types: Marijuana, IV    Comment: UDS + THC  . Sexual activity: Never  Other Topics Concern  . Not on file  Social History Narrative  . Not on file   Social Determinants of Health   Financial Resource Strain: Not on file  Food Insecurity: Not on file  Transportation Needs: Not on file  Physical Activity: Not on file   Stress: Not on file  Social Connections: Not on file     Allergies:   Allergies  Allergen Reactions  . Haldol [Haloperidol Lactate] Other (See Comments)    Makes whole body stiff    Metabolic Disorder Labs: Lab Results  Component Value Date   HGBA1C 5.2 07/12/2020   MPG 102.54 07/12/2020   No results found for: PROLACTIN Lab Results  Component Value Date   CHOL 123 07/12/2020   TRIG 56 07/12/2020   HDL 39 (L) 07/12/2020   CHOLHDL 3.2 07/12/2020   VLDL 11 07/12/2020   LDLCALC 73 07/12/2020   Lab Results  Component Value Date   TSH 0.906 07/12/2020    Therapeutic Level Labs: No results found for: LITHIUM Lab Results  Component Value Date   CBMZ <2.0 (L) 11/21/2016   Lab Results  Component Value Date   VALPROATE 74.4 04/26/2010    Current Medications: Current Outpatient Medications  Medication Sig Dispense Refill  . dicyclomine (BENTYL) 20 MG tablet Take 1 tablet (20 mg total) by mouth 2 (two) times daily as needed 30 tablet 3  . docusate sodium (COLACE) 100 MG capsule Take 1 capsule (100 mg total) by mouth daily. (May buy from over the counter): For constipation 1 capsule 0  . gabapentin (NEURONTIN) 400 MG capsule Take 1 capsule (400 mg total) by mouth 3 (three) times daily. 90 capsule 2  . hydrOXYzine (ATARAX/VISTARIL) 25 MG tablet Take 1 tablet (25 mg total) by mouth 3 (three) times daily as needed for anxiety. 90 tablet 2  . nicotine (NICODERM CQ - DOSED IN MG/24 HOURS) 21 mg/24hr patch Place 1 patch (21 mg total) onto the skin daily. (May buy from over the counter): For nicotine withdrawal 28 patch 0  . omeprazole (PRILOSEC OTC) 20 MG tablet Take 20 mg by mouth daily.    . ondansetron (ZOFRAN ODT) 4 MG disintegrating tablet Take 1 tablet (4 mg total) by mouth every 8 (eight) hours as needed for nausea or vomiting. 20 tablet 0  . prazosin (MINIPRESS) 2 MG capsule Take 1 capsule (2 mg total) by mouth at bedtime. For nightmares 30 capsule 2  . sertraline  (ZOLOFT) 50 MG tablet Take 1 tablet (50 mg total) by mouth at bedtime. For depression 30 tablet 2  . traZODone (DESYREL) 50 MG tablet Take 1 tablet (50 mg total) by mouth at bedtime as needed for sleep. 30 tablet 2   No current facility-administered medications for this visit.    Psychiatric Specialty Exam: Review of Systems  There were no vitals taken for this visit.There is no height or weight on file to calculate BMI.  General Appearance: Fairly Groomed  Eye Contact:  Good  Speech:  Clear and Coherent and  Normal Rate  Volume:  Normal  Mood: Less Depressed  Affect:  Congruent  Thought Process:  Goal Directed and Descriptions of Associations: Intact  Orientation:  Full (Time, Place, and Person)  Thought Content:  Logical  Suicidal Thoughts:  No  Homicidal Thoughts:  No  Memory:  Immediate;   Good Recent;   Good  Judgement:  Fair  Insight:  Fair  Psychomotor Activity:  Normal  Concentration:  Concentration: Good and Attention Span: Good  Recall:  Good  Fund of Knowledge:Good  Language: Good  Akathisia:  Negative  Handed:  Right  AIMS (if indicated):  Not done  Assets:  Communication Skills Desire for Improvement Financial Resources/Insurance Housing  ADL's:  Intact  Cognition: WNL  Sleep:  Fair   Screenings: AIMS   Flowsheet Row [facility]  AIMS Total Score 0 0    AUDIT   Flowsheet Row [facility]) [facility] Ad[facility] (Dis[facility] Ad[facility] (Dis[facility]) [facility] ([facility]  Alcohol Use Disorder Identification Test Final Score (AUDIT) 2 22 29  33    GAD-7    Flowsheet Row Office Visit from 06/05/2016 in [facility] Office Visit from 04/05/2016 in [facility] Office Visit from 02/24/2016 in [facility]  Total GAD-7 Score 11 9 10     PHQ2-9   Flowsheet Row Video Visit from 07/16/2020 in [facility] Office Visit from 06[facility]05/2016 in [facility] Office Visit from 04/05/2016 in [facility] Office Visit from 02/24/2016 in [facility]  PHQ-2 Total Score 0 2 2 2   PHQ-9 Total Score -- 9 9 6     Flowsheet Row Video Visit from 07/16/2020 in [facility] Admission (Discharged) [facility] ED from 07/10/2020 in [facility] EMERGENCY DEPARTMENT  C-SSRS RISK CATEGORY Error: Q3, 4, or 5 should not be populated when Q2 is No Low Risk High Risk      Assessment and Plan: Patient is still using Xanax.  She has been working and has been able to maintain her employment.  Writer recommended that she tries to cut down her use of Xanax to the minimum as possible.  Patient says she is on a try.  She does not seem to be too motivated about quitting Xanax use.  We will continue the same regimen for now.  1. Recurrent major depressive disorder, in partial remission (HCC)  - sertraline (ZOLOFT) 50 MG tablet; Take 1 tablet (50 mg total) by mouth at bedtime. For depression  Dispense: 30 tablet; Refill: 1 - traZODone (DESYREL) 50 MG tablet; Take 1 tablet (50 mg total) by mouth at bedtime as needed for sleep.  Dispense: 30 tablet; Refill: 1 - gabapentin (NEURONTIN) 400 MG capsule; Take 1 capsule (400 mg total) by mouth 3 (three) times daily.  Dispense: 90 capsule; Refill: 1  2. PTSD (post-traumatic stress disorder)  - prazosin (MINIPRESS) 2 MG capsule; Take 1 capsule (2 mg total) by mouth at  bedtime. For nightmares  Dispense: 30 capsule; Refill: 1  3. Anxiety  - hydrOXYzine (  ATARAX/VISTARIL) 25 MG tablet; Take 1 tablet (25 mg total) by mouth 3 (three) times daily as needed for anxiety.  Dispense: 90 tablet; Refill: 1 - sertraline (ZOLOFT) 50 MG tablet; Take 1 tablet (50 mg total) by mouth at bedtime. For depression  Dispense: 30 tablet; Refill: 1 - gabapentin (NEURONTIN) 400 MG capsule; Take 1 capsule (400 mg total) by mouth 3 (three) times daily.  Dispense: 90 capsule; Refill: 1  4. Benzodiazepine Dependence, continuous (HCC)  - gabapentin (NEURONTIN) 400 MG capsule; Take 1 capsule (400 mg total) by mouth 3 (three) times daily.  Dispense: 90 capsule; Refill: 1  5. Opioid use disorder, moderate, in sustained remission (HCC)   6. Cocaine use disorder, moderate, in sustained remission (HCC)  Continue same regimen. Patient was advised to stay away from any illicit substances. Follow-up in 10 weeks.    [name], MD 5/20/202211:24 AM

## 2020-08-30 ENCOUNTER — Other Ambulatory Visit: Payer: Self-pay

## 2020-09-03 ENCOUNTER — Other Ambulatory Visit: Payer: Self-pay

## 2020-09-08 ENCOUNTER — Other Ambulatory Visit: Payer: Self-pay

## 2020-09-17 ENCOUNTER — Other Ambulatory Visit: Payer: Self-pay

## 2020-10-08 ENCOUNTER — Encounter: Payer: Self-pay | Admitting: Internal Medicine

## 2020-10-08 ENCOUNTER — Ambulatory Visit: Payer: Self-pay | Attending: Internal Medicine | Admitting: Internal Medicine

## 2020-10-08 ENCOUNTER — Other Ambulatory Visit: Payer: Self-pay

## 2020-10-08 VITALS — BP 102/68 | HR 101 | Resp 16 | Ht 62.0 in | Wt 141.8 lb

## 2020-10-08 DIAGNOSIS — R634 Abnormal weight loss: Secondary | ICD-10-CM

## 2020-10-08 DIAGNOSIS — R0982 Postnasal drip: Secondary | ICD-10-CM

## 2020-10-08 DIAGNOSIS — F411 Generalized anxiety disorder: Secondary | ICD-10-CM

## 2020-10-08 DIAGNOSIS — R110 Nausea: Secondary | ICD-10-CM

## 2020-10-08 DIAGNOSIS — R059 Cough, unspecified: Secondary | ICD-10-CM

## 2020-10-08 DIAGNOSIS — F17200 Nicotine dependence, unspecified, uncomplicated: Secondary | ICD-10-CM

## 2020-10-08 DIAGNOSIS — F322 Major depressive disorder, single episode, severe without psychotic features: Secondary | ICD-10-CM

## 2020-10-08 MED ORDER — ALBUTEROL SULFATE HFA 108 (90 BASE) MCG/ACT IN AERS
2.0000 | INHALATION_SPRAY | Freq: Four times a day (QID) | RESPIRATORY_TRACT | 1 refills | Status: DC | PRN
  Filled 2020-10-08: qty 8.5, 25d supply, fill #0

## 2020-10-08 MED ORDER — LORATADINE 10 MG PO TABS
10.0000 mg | ORAL_TABLET | Freq: Every day | ORAL | 1 refills | Status: DC
  Filled 2020-10-08: qty 30, 30d supply, fill #0

## 2020-10-08 MED ORDER — FLUTICASONE PROPIONATE 50 MCG/ACT NA SUSP
NASAL | 6 refills | Status: DC
  Filled 2020-10-08: qty 16, 30d supply, fill #0

## 2020-10-08 NOTE — Patient Instructions (Signed)
You can go to the radiology department at [facility] to have the x-ray done.  Consider purchasing and using Ensure or boost to help supplement your meals.  I strongly recommend giving a trial of quitting the marijuana use.  You should apply for the orange card/cone discount card.

## 2020-10-08 NOTE — Progress Notes (Signed)
Patient ID: [name], female    DOB: 18-May-1980  MRN: [id number]  CC: Establish Care   Subjective: [name] is a 40 y.o. female who presents to est with me as PCP.  Had telephone visit with PA 07/2020 Her concerns today include:  Patient with history of PTSD, MDD, opioid use disorder, benzodiazepine dependence, suicide attempts.  Seeing Dr Evelene Croon but she will be leaving.  They will be plugging her in with a new mental health provider home she will see in August. Very stressed and depressed.  Contributing factors are her job, recent break-up with her partner of 4 years and her living situation.  Lives in a camper in her parents' driveway for 1.5 yrs. Has toxic relationship with her mother. Not making enough to afford her own house. PHQ-9 is positive today including the question about suicidal thoughts.  However patient states she has no active plans of wanting to hurt herself.  She would like to be placed back on Xanax.  States that she was on it for quite a number of years and her current psychiatrist would not prescribe it for her.  She feels that her life has gotten worse since being taken off the Xanax.  Complains of intermittent issues with her stomach that occurs every several months.  She would develop uncontrollable nausea and vomiting.  Seen in the ER for this back in September of last year and in February of this year.  In February of this year she was also having diarrhea.  Lipase level was normal.  LFTs normal.  She had CAT scan of the abdomen back in September of last year with no acute findings.  She takes Bentyl which helps.  Most days she still has a bit of nausea.  She admits to marijuana use.  She states that she has been using daily recently due to increased stress.  Loss over 90 lbs since 2020.  TSH has been normal.  Recent A1c screening for diabetes was normal.  She reports poor appetite which she attributes to depression and stress.  No blood in the stools.   She has had some thinning of hair at the front of the head for a while.  Smoke 1 pk a day since age 62.  Never quit before and not ready to do so. Complains of cough productive of clear mucus x 1 mth..  Some drainage at back of throat at times.  She sometimes feels pain in the throat.  She has acid reflux and is on omeprazole which she feels keeps the acid reflux under good control.  SOB in mornings over the past mth. Some wheezing but not a lot.     Patient Active Problem List   Diagnosis Date Noted   Recurrent major depressive disorder, in partial remission (HCC) 07/16/2020   Major depressive disorder, recurrent severe without psychotic features (HCC) 07/11/2020   Opioid use disorder, moderate, in sustained remission (HCC) 01/30/2020   Cocaine use disorder, moderate, in sustained remission (HCC) 12/05/2019   Anxiety 12/05/2019   PTSD (post-traumatic stress disorder) 12/05/2019   Intermittent explosive disorder 11/22/2016   Benzodiazepine dependence, continuous (HCC) 11/01/2013   Substance abuse/dependence 02/21/2013     Current Outpatient Medications on File Prior to Visit  Medication Sig Dispense Refill   ALPRAZolam (XANAX) 1 MG tablet Take 1 mg by mouth 3 (three) times daily as needed for anxiety.     dicyclomine (BENTYL) 20 MG tablet Take 1 tablet (20 mg total) by mouth  2 (two) times daily as needed 30 tablet 3   docusate sodium (COLACE) 100 MG capsule Take 1 capsule (100 mg total) by mouth daily. (May buy from over the counter): For constipation (Patient not taking: Reported on 10/08/2020) 1 capsule 0   gabapentin (NEURONTIN) 400 MG capsule Take 1 capsule (400 mg total) by mouth 3 (three) times daily. 90 capsule 2   hydrOXYzine (ATARAX/VISTARIL) 25 MG tablet Take 1 tablet (25 mg total) by mouth 3 (three) times daily as needed for anxiety. 90 tablet 2   nicotine (NICODERM CQ - DOSED IN MG/24 HOURS) 21 mg/24hr patch Place 1 patch (21 mg total) onto the skin daily. (May buy from over the  counter): For nicotine withdrawal (Patient not taking: Reported on 10/08/2020) 28 patch 0   omeprazole (PRILOSEC OTC) 20 MG tablet Take 20 mg by mouth daily.     ondansetron (ZOFRAN ODT) 4 MG disintegrating tablet Take 1 tablet (4 mg total) by mouth every 8 (eight) hours as needed for nausea or vomiting. 20 tablet 0   prazosin (MINIPRESS) 2 MG capsule Take 1 capsule (2 mg total) by mouth at bedtime. For nightmares 30 capsule 2   sertraline (ZOLOFT) 50 MG tablet Take 1 tablet (50 mg total) by mouth at bedtime. For depression 30 tablet 2   traZODone (DESYREL) 50 MG tablet Take 1 tablet (50 mg total) by mouth at bedtime as needed for sleep. 30 tablet 2   [DISCONTINUED] mirtazapine (REMERON) 15 MG tablet Take 1 tablet (15 mg total) by mouth at bedtime. 30 tablet 1   No current facility-administered medications on file prior to visit.    Allergies  Allergen Reactions   Haldol [Haloperidol Lactate] Other (See Comments)    Makes whole body stiff    Social History   Socioeconomic History   Marital status: Single    Spouse name: Not on file   Number of children: Not on file   Years of education: Not on file   Highest education level: Not on file  Occupational History   Not on file  Tobacco Use   Smoking status: Every Day    Packs/day: 2.00    Years: 17.00    Pack years: 34.00    Types: Cigarettes   Smokeless tobacco: Never  Vaping Use   Vaping Use: Never used  Substance and Sexual Activity   Alcohol use: Yes    Comment: +63   Drug use: Yes    Types: Marijuana, IV    Comment: UDS + THC   Sexual activity: Never  Other Topics Concern   Not on file  Social History Narrative   Not on file   Social Determinants of Health   Financial Resource Strain: Not on file  Food Insecurity: Not on file  Transportation Needs: Not on file  Physical Activity: Not on file  Stress: Not on file  Social Connections: Not on file  Intimate Partner Violence: Not on file    No family history on  file.  No past surgical history on file.  ROS: Review of Systems Negative except as stated above  PHYSICAL EXAM: BP 102/68   Pulse (!) 101   Resp 16   Ht 5\' 2"  (1.575 m)   Wt 141 lb 12.8 oz (64.3 kg)   SpO2 95%   BMI 25.94 kg/m   Wt Readings from Last 3 Encounters:  10/08/20 141 lb 12.8 oz (64.3 kg)  07/11/20 129 lb (58.5 kg)  06/02/20 132 lb (59.9 kg)  This SmartLink has not been configured with any valid records.    Physical Exam  General appearance - alert, well appearing, middle-aged Caucasian female and in no distress Mental status - normal mood, behavior, speech, dress, motor activity, and thought processes Eyes - pupils equal and reactive, extraocular eye movements intact Mouth - mucous membranes moist, pharynx normal without lesions Neck - supple, no significant adenopathy.  No thyroid enlargement.  No thyroid nodules appreciated. Chest - clear to auscultation, no wheezes, rales or rhonchi, symmetric air entry Heart - normal rate, regular rhythm, normal S1, S2, no murmurs, rubs, clicks or gallops Abdomen - soft, nontender, nondistended, no masses or organomegaly Extremities - peripheral pulses normal, no pedal edema, no clubbing or cyanosis  Depression screen [facility] 2/9 10/08/2020 07/16/2020 06/05/2016  Decreased Interest 3 0 1  Down, Depressed, Hopeless 3 0 1  PHQ - 2 Score 6 0 2  Altered sleeping 3 - 1  Tired, decreased energy 3 - 1  Change in appetite 3 - 1  Feeling bad or failure about yourself  3 - 1  Trouble concentrating 3 - 1  Moving slowly or fidgety/restless 3 - 1  Suicidal thoughts 3 - 1  PHQ-9 Score 27 - 9     CMP Latest Ref Rng & Units 07/11/2020 06/02/2020 01/06/2020  Glucose 70 - 99 mg/dL 84 947(S) 962(E)  BUN 6 - 20 mg/dL 8 14 19   Creatinine 0.44 - 1.00 mg/dL 3.66 2.94  Sodium 135 - 145 mmol/L 140 138 133(L)  Potassium 3.5 - 5.1 mmol/L 3.7 3.1(L) 3.3(L)  Chloride 98 - 111 mmol/L 109 106 102  CO2 22 - 32 mmol/L 24 20(L) 22  Calcium 8.9  - 10.3 mg/dL 9.0 9.3 8.9  Total Protein 6.5 - 8.1 g/dL 7.1 7.4 7.1  Total Bilirubin 0.3 - 1.2 mg/dL 0.5 0.6 0.7  Alkaline Phos 38 - 126 U/L 41 39 40  AST 15 - 41 U/L 20 20 13(L)  ALT 0 - 44 U/L 17 17 13    Lipid Panel     Component Value Date/Time   CHOL 123 07/12/2020 0628   TRIG 56 07/12/2020 0628   HDL 39 (L) 07/12/2020 0628   CHOLHDL 3.2 07/12/2020 0628   VLDL 11 07/12/2020 0628   LDLCALC 73 07/12/2020 0628    CBC    Component Value Date/Time   WBC 5.6 07/11/2020 0006   RBC 3.87 07/11/2020 0006   HGB 13.3 07/11/2020 0006   HCT 39.2 07/11/2020 0006   PLT 301 07/11/2020 0006   MCV 101.3 (H) 07/11/2020 0006   MCH 34.4 (H) 07/11/2020 0006   MCHC 33.9 07/11/2020 0006   RDW 11.9 07/11/2020 0006   LYMPHSABS 1.8 07/11/2020 0006   MONOABS 0.3 07/11/2020 0006   EOSABS 0.0 07/11/2020 0006   BASOSABS 0.1 07/11/2020 0006    ASSESSMENT AND PLAN: 1. Nausea in adult Advised patient that daily use of marijuana can sometimes cause hyperemesis.  I recommend giving a trial of staying off the marijuana for several months.  She will continue to use the Bentyl since she does report some relief with that.  Continue omeprazole.  Advised to apply for the orange card/cone discount card and once approved we can refer her to the gastroenterologist.  2. Cough Strongly advised to quit smoking.  Discussed health risks associated with smoking. Differential diagnosis for her cough can include bronchitis/emphysema given the years that she has smoked, postnasal drip or asthma.  We will give a trial of albuterol inhaler,  Flonase nasal spray and Claritin.  Baseline chest x-ray ordered. - albuterol (VENTOLIN HFA) 108 (90 Base) MCG/ACT inhaler; Inhale 2 puffs into the lungs every 6 (six) hours as needed for wheezing or shortness of breath.  Dispense: 8.5 g; Refill: 1 - fluticasone (FLONASE) 50 MCG/ACT nasal spray; instill 1 spray each nostril daily x 3 days then as needed for postnasal drainage.  Dispense: 16  g; Refill: 6 - loratadine (CLARITIN) 10 MG tablet; Take 1 tablet (10 mg total) by mouth daily.  Dispense: 30 tablet; Refill: 1 - DG Chest 2 View; Future  3. Post-nasal drainage Start Flonase and Claritin.  4. Tobacco dependence Advised to quit smoking.  Discussed health risks associated with smoking.  Patient not willing to give a trial of quitting at this time.  5. Weight loss, unintentional May be due to depression plus substance use.  She will continue follow-up with behavioral health.  6. Severe major depression without psychotic features [facility](HCC) -Asked patient whether she has an emergency plan in place should she feel the need to act on suicidal thoughts.  She does.  I recommend being seen in the emergency room if she does have the urge to act on her thoughts. 7. GAD (generalized anxiety disorder) I told her that I am unable and unwilling to prescribe Xanax for her.  She can discuss with her behavioral health provider.    Patient was given the opportunity to ask questions.  Patient verbalized understanding of the plan and was able to repeat key elements of the plan.   No orders of the defined types were placed in this encounter.    Requested Prescriptions    No prescriptions requested or ordered in this encounter    No follow-ups on file.  Jonah Blueeborah Dennys Traughber, MD, FACP

## 2020-10-08 NOTE — Progress Notes (Signed)
Pt states her throat is sore from coughing  Pt states she has been coughing because she has been smoking a lot and she hasn't cut back since coughing

## 2020-10-15 ENCOUNTER — Other Ambulatory Visit: Payer: Self-pay

## 2020-11-09 ENCOUNTER — Telehealth (HOSPITAL_COMMUNITY): Payer: No Payment, Other | Admitting: Physician Assistant

## 2020-11-12 ENCOUNTER — Telehealth (INDEPENDENT_AMBULATORY_CARE_PROVIDER_SITE_OTHER): Payer: No Payment, Other | Admitting: Family

## 2020-11-12 ENCOUNTER — Other Ambulatory Visit: Payer: Self-pay

## 2020-11-12 ENCOUNTER — Encounter (HOSPITAL_COMMUNITY): Payer: Self-pay | Admitting: Family

## 2020-11-12 DIAGNOSIS — F331 Major depressive disorder, recurrent, moderate: Secondary | ICD-10-CM

## 2020-11-12 DIAGNOSIS — F3341 Major depressive disorder, recurrent, in partial remission: Secondary | ICD-10-CM

## 2020-11-12 DIAGNOSIS — F1320 Sedative, hypnotic or anxiolytic dependence, uncomplicated: Secondary | ICD-10-CM

## 2020-11-12 DIAGNOSIS — F419 Anxiety disorder, unspecified: Secondary | ICD-10-CM | POA: Diagnosis not present

## 2020-11-12 DIAGNOSIS — F4310 Post-traumatic stress disorder, unspecified: Secondary | ICD-10-CM

## 2020-11-12 MED ORDER — TRAZODONE HCL 50 MG PO TABS
50.0000 mg | ORAL_TABLET | Freq: Every evening | ORAL | 2 refills | Status: DC | PRN
  Filled 2020-11-12: qty 30, 30d supply, fill #0

## 2020-11-12 MED ORDER — PRAZOSIN HCL 2 MG PO CAPS
2.0000 mg | ORAL_CAPSULE | Freq: Every day | ORAL | 2 refills | Status: DC
Start: 2020-11-12 — End: 2020-11-21
  Filled 2020-11-12: qty 30, 30d supply, fill #0

## 2020-11-12 MED ORDER — GABAPENTIN 400 MG PO CAPS
400.0000 mg | ORAL_CAPSULE | Freq: Three times a day (TID) | ORAL | 2 refills | Status: DC
  Filled 2020-11-12: qty 90, 30d supply, fill #0

## 2020-11-12 MED ORDER — HYDROXYZINE HCL 25 MG PO TABS
25.0000 mg | ORAL_TABLET | Freq: Three times a day (TID) | ORAL | 2 refills | Status: DC | PRN
  Filled 2020-11-12: qty 90, 30d supply, fill #0

## 2020-11-12 MED ORDER — SERTRALINE HCL 50 MG PO TABS
50.0000 mg | ORAL_TABLET | Freq: Every day | ORAL | 2 refills | Status: DC
Start: 2020-11-12 — End: 2020-11-21
  Filled 2020-11-12: qty 30, 30d supply, fill #0

## 2020-11-12 NOTE — Progress Notes (Signed)
Virtual Visit via Telephone Note  I connected with [name] on 11/12/20 at  2:00 PM EDT by telephone and verified that I am speaking with the correct person using two identifiers.  Location: Patient:Home Provider: Office   I discussed the limitations, risks, security and privacy concerns of performing an evaluation and management service by telephone and the availability of in person appointments. I also discussed with the patient that there may be a patient responsible charge related to this service. The patient expressed understanding and agreed to proceed.   I discussed the assessment and treatment plan with the patient. The patient was provided an opportunity to ask questions and all were answered. The patient agreed with the plan and demonstrated an understanding of the instructions.   The patient was advised to call back or seek an in-person evaluation if the symptoms worsen or if the condition fails to improve as anticipated.  I provided 15 minutes of non-face-to-face time during this encounter.   Denise Rack, NP   BH MD/PA/NP OP Progress Note  11/12/2020 1:11 PM [name]  MRN:  [id number]  Chief Complaint:  [name] reported "  I am feeling hopeless and depressed."   HPI:  [name] 40 year old female presents for medication management. Reports her depression and anxiety is related to her recent break-up with her girlfriend. Stated that she feels hopeless and alone. Stated that she has been separated for the past 3 to 4 months.States they have been dating for the past 4-years.   Reports her girlfriend's main concern is her mood irritability and explosiveness.  States everything is bad now living arrangement is toxic.  States" I recently had to put my cat to sleep."   Reports " suicidal every day."  Currently denying plan or intent.     Patient is requesting to be restarted on Xanax.  States" if yall don't  refill my xanax then  I will continue  to buy the medication of the street."      Later during this assessment patient reports she would not be suicidal if she was able to take Xanax to help "calm her nerves."    Patient reports she has not been taking Zoloft or Minipress as prescribed due to financial issues.  States she has been off this medication for the past 2 weeks.  Denies any recent inpatient admissions.  Discussed crisis hotline and patient to consider starting partial hospitalization program for the [facility].  Patient was receptive to plan.  States she is currently employed by [employer] and will follow-up to see if she is able to get time off.   -Patient was offered substance abuse treatment she declines stating she is completed detox of the past which was not helpful.  Patient to keep follow-up appointment in 3 months.  Support encouragement reassurance was provided.  Visit Diagnosis:    ICD-10-CM   1. MDD (major depressive disorder), recurrent episode, moderate (HCC)  F33.1     2. Anxiety  F41.9     3. Benzodiazepine dependence, continuous (HCC)  F13.20       Past Psychiatric History:   Past Medical History:  Past Medical History:  Diagnosis Date   Anxiety    Benzodiazepine abuse (HCC)    Bipolar 1 disorder (HCC)    Depression    Drug abuse (HCC)    ETOH abuse    Polysubstance abuse (HCC)    No past surgical history on file.  Family Psychiatric History:   Family  History: No family history on file.  Social History:  Social History   Socioeconomic History   Marital status: Single    Spouse name: Not on file   Number of children: Not on file   Years of education: Not on file   Highest education level: Not on file  Occupational History   Not on file  Tobacco Use   Smoking status: Every Day    Packs/day: 2.00    Years: 17.00    Pack years: 34.00    Types: Cigarettes   Smokeless tobacco: Never  Vaping Use   Vaping Use: Never used  Substance and Sexual Activity   Alcohol use: Yes    Comment:  +63   Drug use: Yes    Types: Marijuana, IV    Comment: UDS + THC   Sexual activity: Never  Other Topics Concern   Not on file  Social History Narrative   Not on file   Social Determinants of Health   Financial Resource Strain: Not on file  Food Insecurity: Not on file  Transportation Needs: Not on file  Physical Activity: Not on file  Stress: Not on file  Social Connections: Not on file    Allergies:  Allergies  Allergen Reactions   Haldol [Haloperidol Lactate] Other (See Comments)    Makes whole body stiff    Metabolic Disorder Labs: Lab Results  Component Value Date   HGBA1C 5.2 07/12/2020   MPG 102.54 07/12/2020   No results found for: PROLACTIN Lab Results  Component Value Date   CHOL 123 07/12/2020   TRIG 56 07/12/2020   HDL 39 (L) 07/12/2020   CHOLHDL 3.2 07/12/2020   VLDL 11 07/12/2020   LDLCALC 73 07/12/2020   Lab Results  Component Value Date   TSH 0.906 07/12/2020   TSH 1.29 02/24/2016    Therapeutic Level Labs: No results found for: LITHIUM Lab Results  Component Value Date   VALPROATE 74.4 04/26/2010   VALPROATE 75.3 01/21/2010   No components found for:  CBMZ  Current Medications: Current Outpatient Medications  Medication Sig Dispense Refill   albuterol (VENTOLIN HFA) 108 (90 Base) MCG/ACT inhaler Inhale 2 puffs into the lungs every 6 (six) hours as needed for wheezing or shortness of breath. 8.5 g 1   ALPRAZolam (XANAX) 1 MG tablet Take 1 mg by mouth 3 (three) times daily as needed for anxiety.     dicyclomine (BENTYL) 20 MG tablet Take 1 tablet (20 mg total) by mouth 2 (two) times daily as needed 30 tablet 3   docusate sodium (COLACE) 100 MG capsule Take 1 capsule (100 mg total) by mouth daily. (May buy from over the counter): For constipation (Patient not taking: Reported on 10/08/2020) 1 capsule 0   fluticasone (FLONASE) 50 MCG/ACT nasal spray instill 1 spray each nostril daily x 3 days then as needed for postnasal drainage. 16 g 6    gabapentin (NEURONTIN) 400 MG capsule Take 1 capsule (400 mg total) by mouth 3 (three) times daily. 90 capsule 2   hydrOXYzine (ATARAX/VISTARIL) 25 MG tablet Take 1 tablet (25 mg total) by mouth 3 (three) times daily as needed for anxiety. 90 tablet 2   loratadine (CLARITIN) 10 MG tablet Take 1 tablet (10 mg total) by mouth daily. 30 tablet 1   omeprazole (PRILOSEC OTC) 20 MG tablet Take 20 mg by mouth daily.     ondansetron (ZOFRAN ODT) 4 MG disintegrating tablet Take 1 tablet (4 mg total) by mouth every 8 (eight)  hours as needed for nausea or vomiting. 20 tablet 0   prazosin (MINIPRESS) 2 MG capsule Take 1 capsule (2 mg total) by mouth at bedtime. For nightmares 30 capsule 2   sertraline (ZOLOFT) 50 MG tablet Take 1 tablet (50 mg total) by mouth at bedtime. For depression 30 tablet 2   traZODone (DESYREL) 50 MG tablet Take 1 tablet (50 mg total) by mouth at bedtime as needed for sleep. 30 tablet 2   No current facility-administered medications for this visit.     Musculoskeletal: Strength & Muscle Tone: within normal limits Gait & Station: normal Patient leans: N/A  Psychiatric Specialty Exam: Review of Systems  There were no vitals taken for this visit.There is no height or weight on file to calculate BMI.  General Appearance: NA  Eye Contact:  NA  Speech:  Clear and Coherent  Volume:  Normal  Mood:  Anxious and Depressed  Affect:  Depressed  Thought Process:  Coherent  Orientation:  Full (Time, Place, and Person)  Thought Content: Logical   Suicidal Thoughts:  Yes.  without intent/plan  Homicidal Thoughts:  No  Memory:  Immediate;   Fair Recent;   Fair Remote;   Fair  Judgement:  Fair  Insight:  Fair  Psychomotor Activity:  Normal  Concentration:  Concentration: Fair  Recall:  [employer] of Knowledge: Fair  Language: Fair  Akathisia:  NA  Handed:  Right  AIMS (if indicated): done  Assets:  Communication Skills Intimacy Resilience Social Support  ADL's:  Intact   Cognition: WNL  Sleep:  Fair   Screenings: AIMS    Flowsheet Row [facility]) [facility]  AIMS Total Score 0 0      AUDIT    Flowsheet Row [facility]) [facility] ([facility] ([facility] ([facility]) [facility]  Alcohol Use Disorder Identification Test Final Score (AUDIT) 2 22 29  33      GAD-7    Flowsheet Row Office Visit from 10/08/2020 in [facility] Office Visit from 06/05/2016 in [facility] Office Visit from 04/05/2016 in [facility] Office Visit from 02/24/2016 in [facility]  Total GAD-7 Score 19 11 9 10       PHQ2-9    Flowsheet Row Office Visit from 10/08/2020 in [facility] Video Visit from 07/16/2020 in [facility] Office Visit from 06/05/2016 in [facility] Office Visit from 04/05/2016 in [facility] Office Visit from 02/24/2016 in [facility]  PHQ-2 Total Score 6 0 2 2 2   PHQ-9 Total Score 27 -- 9 9 6       Flowsheet Row Video Visit from 07/16/2020 in [facility] ED from 07/10/2020 in [facility] EMERGENCY DEPART[facility]  C-SSRS RISK CATEGORY Error: Q3, 4, or 5 should not be populated when Q2 is No Low Risk High Risk        Assessment and Plan:   Bethzaida Boord  40 year old female with a  charted history of major depressive disorder generalized anxiety disorder and substance abuse history.  Continues to request to be restarted on Xanax as she states utilizing Xanax daily without prescription.  States she has not been able to afford her current medications which she has been off for the past 2 weeks.  Reports chronic suicidal ideations.  Patient reports she has plans to restart medication this Friday.  Crisis hotline was provided.  Keep follow-up appointment with all outpatient providers.  Support, encouragement and reassurance was provided.   Major depressive disorder: Generalized anxiety disorder:  -Continue Zoloft 50 mg p.o. daily Continue Minipress 2 mg p.o. nightly Continue trazodone 50 mg p.o. daily Continue hydroxyzine 25 mg p.o. 3 times daily as needed Continue gabapentin 400 mg p.o. 3 times daily  Patient to consider following up with partial hospitalization programming at the [facility] behavioral health for additional coping skills Patient to follow-up in 3 months   Denise Rack, NP 11/12/2020, 1:11 PM

## 2020-11-15 ENCOUNTER — Telehealth (HOSPITAL_COMMUNITY): Payer: Self-pay | Admitting: Professional

## 2020-11-15 NOTE — Telephone Encounter (Signed)
See call log 

## 2020-11-16 ENCOUNTER — Telehealth (HOSPITAL_COMMUNITY): Payer: Self-pay | Admitting: Professional

## 2020-11-16 NOTE — Telephone Encounter (Signed)
See call log 

## 2020-11-17 ENCOUNTER — Ambulatory Visit (HOSPITAL_COMMUNITY): Payer: No Payment, Other | Admitting: Professional
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
## 2020-11-19 ENCOUNTER — Encounter (HOSPITAL_COMMUNITY): Payer: Self-pay

## 2020-11-19 ENCOUNTER — Other Ambulatory Visit: Payer: Self-pay

## 2020-11-19 ENCOUNTER — Emergency Department (HOSPITAL_COMMUNITY): Payer: Self-pay

## 2020-11-19 ENCOUNTER — Emergency Department (HOSPITAL_COMMUNITY)
Admission: EM | Admit: 2020-11-19 | Discharge: 2020-11-20 | Disposition: A | Discharge status: Home / Self Care | Payer: Self-pay | Attending: Student | Admitting: Student

## 2020-11-19 DIAGNOSIS — F1320 Sedative, hypnotic or anxiolytic dependence, uncomplicated: Secondary | ICD-10-CM | POA: Diagnosis present

## 2020-11-19 DIAGNOSIS — F17210 Nicotine dependence, cigarettes, uncomplicated: Secondary | ICD-10-CM | POA: Insufficient documentation

## 2020-11-19 DIAGNOSIS — R451 Restlessness and agitation: Secondary | ICD-10-CM | POA: Insufficient documentation

## 2020-11-19 DIAGNOSIS — Z20822 Contact with and (suspected) exposure to covid-19: Secondary | ICD-10-CM | POA: Insufficient documentation

## 2020-11-19 DIAGNOSIS — Y906 Blood alcohol level of 120-199 mg/100 ml: Secondary | ICD-10-CM | POA: Insufficient documentation

## 2020-11-19 DIAGNOSIS — F1020 Alcohol dependence, uncomplicated: Secondary | ICD-10-CM | POA: Insufficient documentation

## 2020-11-19 DIAGNOSIS — F1220 Cannabis dependence, uncomplicated: Secondary | ICD-10-CM | POA: Insufficient documentation

## 2020-11-19 DIAGNOSIS — F332 Major depressive disorder, recurrent severe without psychotic features: Secondary | ICD-10-CM | POA: Diagnosis present

## 2020-11-19 DIAGNOSIS — E876 Hypokalemia: Secondary | ICD-10-CM | POA: Insufficient documentation

## 2020-11-19 DIAGNOSIS — R519 Headache, unspecified: Secondary | ICD-10-CM | POA: Insufficient documentation

## 2020-11-19 DIAGNOSIS — R4689 Other symptoms and signs involving appearance and behavior: Secondary | ICD-10-CM

## 2020-11-19 DIAGNOSIS — R45851 Suicidal ideations: Secondary | ICD-10-CM

## 2020-11-19 LAB — CBC WITH DIFFERENTIAL/PLATELET
Abs Immature Granulocytes: 0.02 10*3/uL (ref 0.00–0.07)
Basophils Absolute: 0.1 10*3/uL (ref 0.0–0.1)
Basophils Relative: 1 %
Eosinophils Absolute: 0.1 10*3/uL (ref 0.0–0.5)
Eosinophils Relative: 1 %
HCT: 39.4 % (ref 36.0–46.0)
Hemoglobin: 13.8 g/dL (ref 12.0–15.0)
Immature Granulocytes: 0 %
Lymphocytes Relative: 35 %
Lymphs Abs: 2.9 10*3/uL (ref 0.7–4.0)
MCH: 34.2 pg — ABNORMAL HIGH (ref 26.0–34.0)
MCHC: 35 g/dL (ref 30.0–36.0)
MCV: 97.8 fL (ref 80.0–100.0)
Monocytes Absolute: 0.5 10*3/uL (ref 0.1–1.0)
Monocytes Relative: 6 %
Neutro Abs: 4.9 10*3/uL (ref 1.7–7.7)
Neutrophils Relative %: 57 %
Platelets: 375 10*3/uL (ref 150–400)
RBC: 4.03 MIL/uL (ref 3.87–5.11)
RDW: 12.3 % (ref 11.5–15.5)
WBC: 8.4 10*3/uL (ref 4.0–10.5)
nRBC: 0 % (ref 0.0–0.2)

## 2020-11-19 LAB — COMPREHENSIVE METABOLIC PANEL
ALT: 15 U/L (ref 0–44)
AST: 19 U/L (ref 15–41)
Albumin: 4.1 g/dL (ref 3.5–5.0)
Alkaline Phosphatase: 42 U/L (ref 38–126)
Anion gap: 13 (ref 5–15)
BUN: 9 mg/dL (ref 6–20)
CO2: 17 mmol/L — ABNORMAL LOW (ref 22–32)
Calcium: 8.8 mg/dL — ABNORMAL LOW (ref 8.9–10.3)
Chloride: 113 mmol/L — ABNORMAL HIGH (ref 98–111)
Creatinine, Ser: 0.62 mg/dL (ref 0.44–1.00)
GFR, Estimated: >60 mL/min (ref >60)
Glucose, Bld: 80 mg/dL (ref 70–99)
Potassium: 2.9 mmol/L — ABNORMAL LOW (ref 3.5–5.1)
Sodium: 143 mmol/L (ref 135–145)
Total Bilirubin: 0.6 mg/dL (ref 0.3–1.2)
Total Protein: 7.1 g/dL (ref 6.5–8.1)

## 2020-11-19 LAB — URINALYSIS, ROUTINE W REFLEX MICROSCOPIC
Bilirubin Urine: NEGATIVE
Glucose, UA: NEGATIVE mg/dL
Ketones, ur: NEGATIVE mg/dL
Leukocytes,Ua: NEGATIVE
Nitrite: NEGATIVE
Protein, ur: NEGATIVE mg/dL
Specific Gravity, Urine: 1.006 (ref 1.005–1.030)
pH: 5 (ref 5.0–8.0)

## 2020-11-19 LAB — RAPID URINE DRUG SCREEN, HOSP PERFORMED
Amphetamines: NOT DETECTED
Barbiturates: NOT DETECTED
Benzodiazepines: POSITIVE — AB
Cocaine: NOT DETECTED
Opiates: NOT DETECTED
Tetrahydrocannabinol: POSITIVE — AB

## 2020-11-19 LAB — I-STAT BETA HCG BLOOD, ED (MC, WL, AP ONLY): I-stat hCG, quantitative: <5 m[IU]/mL (ref <5)

## 2020-11-19 LAB — ACETAMINOPHEN LEVEL: Acetaminophen (Tylenol), Serum: <10 ug/mL — ABNORMAL LOW (ref 10–30)

## 2020-11-19 LAB — ETHANOL: Alcohol, Ethyl (B): 153 mg/dL — ABNORMAL HIGH (ref <10)

## 2020-11-19 LAB — RESP PANEL BY RT-PCR (FLU A&B, COVID) ARPGX2
Influenza A by PCR: NEGATIVE
Influenza B by PCR: NEGATIVE
SARS Coronavirus 2 by RT PCR: NEGATIVE

## 2020-11-19 LAB — SALICYLATE LEVEL: Salicylate Lvl: <7 mg/dL — ABNORMAL LOW (ref 7.0–30.0)

## 2020-11-19 MED ORDER — ZIPRASIDONE MESYLATE 20 MG IM SOLR
20.0000 mg | Freq: Once | INTRAMUSCULAR | Status: AC
  Administered 2020-11-19: 20 mg via INTRAMUSCULAR
  Filled 2020-11-19: qty 20

## 2020-11-19 MED ORDER — KETAMINE HCL 50 MG/ML IJ SOLN
4.0000 mg/kg | Freq: Once | INTRAMUSCULAR | Status: DC | PRN
Start: 2020-11-19 — End: 2020-11-20

## 2020-11-19 MED ORDER — KETAMINE HCL 50 MG/5ML IJ SOSY
PREFILLED_SYRINGE | INTRAMUSCULAR | Status: AC
  Filled 2020-11-19: qty 5

## 2020-11-19 MED ORDER — MAGNESIUM OXIDE -MG SUPPLEMENT 400 (240 MG) MG PO TABS
800.0000 mg | ORAL_TABLET | Freq: Once | ORAL | Status: AC
  Administered 2020-11-20: 800 mg via ORAL
  Filled 2020-11-19: qty 2

## 2020-11-19 MED ORDER — POTASSIUM CHLORIDE CRYS ER 20 MEQ PO TBCR
40.0000 meq | EXTENDED_RELEASE_TABLET | Freq: Once | ORAL | Status: AC
  Administered 2020-11-20: 40 meq via ORAL
  Filled 2020-11-19: qty 2

## 2020-11-19 MED ORDER — LORAZEPAM 2 MG/ML IJ SOLN
2.0000 mg | Freq: Once | INTRAMUSCULAR | Status: AC
  Administered 2020-11-19: 2 mg via INTRAMUSCULAR
  Filled 2020-11-19: qty 1

## 2020-11-19 MED ORDER — KETAMINE HCL 10 MG/ML IJ SOLN
INTRAMUSCULAR | Status: AC
  Filled 2020-11-19: qty 1
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
## 2020-11-19 NOTE — ED Notes (Signed)
Pt has been dressed out into burgundy scrubs. Pt belongings have been placed in the cabinet and security has pt pocket knife with them.

## 2020-11-20 ENCOUNTER — Other Ambulatory Visit: Payer: Self-pay

## 2020-11-20 ENCOUNTER — Encounter (HOSPITAL_COMMUNITY): Payer: Self-pay | Admitting: Adult Health

## 2020-11-20 ENCOUNTER — Inpatient Hospital Stay (HOSPITAL_COMMUNITY)
Admission: AD | Admit: 2020-11-20 | Discharge: 2020-11-21 | DRG: 897 | Disposition: A | Discharge status: Home / Self Care | Payer: Federal, State, Local not specified - Other | Source: Intra-hospital | Attending: Psychiatry | Admitting: Psychiatry

## 2020-11-20 DIAGNOSIS — Z20822 Contact with and (suspected) exposure to covid-19: Secondary | ICD-10-CM | POA: Diagnosis present

## 2020-11-20 DIAGNOSIS — F1020 Alcohol dependence, uncomplicated: Principal | ICD-10-CM | POA: Diagnosis present

## 2020-11-20 DIAGNOSIS — F332 Major depressive disorder, recurrent severe without psychotic features: Secondary | ICD-10-CM | POA: Diagnosis present

## 2020-11-20 DIAGNOSIS — Y906 Blood alcohol level of 120-199 mg/100 ml: Secondary | ICD-10-CM | POA: Diagnosis present

## 2020-11-20 DIAGNOSIS — K219 Gastro-esophageal reflux disease without esophagitis: Secondary | ICD-10-CM | POA: Diagnosis present

## 2020-11-20 DIAGNOSIS — F1320 Sedative, hypnotic or anxiolytic dependence, uncomplicated: Secondary | ICD-10-CM | POA: Diagnosis present

## 2020-11-20 DIAGNOSIS — R45851 Suicidal ideations: Secondary | ICD-10-CM | POA: Diagnosis present

## 2020-11-20 DIAGNOSIS — F17210 Nicotine dependence, cigarettes, uncomplicated: Secondary | ICD-10-CM | POA: Diagnosis present

## 2020-11-20 DIAGNOSIS — G4700 Insomnia, unspecified: Secondary | ICD-10-CM | POA: Diagnosis present

## 2020-11-20 DIAGNOSIS — F3341 Major depressive disorder, recurrent, in partial remission: Secondary | ICD-10-CM

## 2020-11-20 DIAGNOSIS — J440 Chronic obstructive pulmonary disease with acute lower respiratory infection: Secondary | ICD-10-CM | POA: Diagnosis present

## 2020-11-20 DIAGNOSIS — F419 Anxiety disorder, unspecified: Secondary | ICD-10-CM

## 2020-11-20 MED ORDER — PANTOPRAZOLE SODIUM 40 MG PO TBEC
40.0000 mg | DELAYED_RELEASE_TABLET | Freq: Every day | ORAL | Status: DC
  Administered 2020-11-20 – 2020-11-21 (×2): 40 mg via ORAL
  Filled 2020-11-20: qty 1
  Filled 2020-11-20: qty 7
  Filled 2020-11-20 (×4): qty 1

## 2020-11-20 MED ORDER — WHITE PETROLATUM EX OINT
TOPICAL_OINTMENT | CUTANEOUS | Status: AC
  Administered 2020-11-20: 1
  Filled 2020-11-20: qty 5

## 2020-11-20 MED ORDER — ACETAMINOPHEN 325 MG PO TABS: 650.0000 mg | ORAL_TABLET | Freq: Four times a day (QID) | ORAL | Status: DC | PRN

## 2020-11-20 MED ORDER — ALBUTEROL SULFATE HFA 108 (90 BASE) MCG/ACT IN AERS
2.0000 | INHALATION_SPRAY | Freq: Four times a day (QID) | RESPIRATORY_TRACT | Status: DC | PRN
  Filled 2020-11-20: qty 6.7

## 2020-11-20 MED ORDER — ONDANSETRON 4 MG PO TBDP: 4.0000 mg | ORAL_TABLET | Freq: Three times a day (TID) | ORAL | Status: DC | PRN

## 2020-11-20 MED ORDER — OMEPRAZOLE MAGNESIUM 20 MG PO TBEC: 20.0000 mg | DELAYED_RELEASE_TABLET | Freq: Every day | ORAL | Status: DC

## 2020-11-20 MED ORDER — IBUPROFEN 200 MG PO TABS: 600.0000 mg | ORAL_TABLET | Freq: Three times a day (TID) | ORAL | Status: DC | PRN

## 2020-11-20 MED ORDER — GABAPENTIN 400 MG PO CAPS
400.0000 mg | ORAL_CAPSULE | Freq: Three times a day (TID) | ORAL | Status: DC
  Administered 2020-11-20 – 2020-11-21 (×3): 400 mg via ORAL
  Filled 2020-11-20 (×2): qty 1
  Filled 2020-11-20: qty 21
  Filled 2020-11-20: qty 1
  Filled 2020-11-20: qty 21
  Filled 2020-11-20: qty 1
  Filled 2020-11-20: qty 21
  Filled 2020-11-20 (×3): qty 1

## 2020-11-20 MED ORDER — GABAPENTIN 400 MG PO CAPS
400.0000 mg | ORAL_CAPSULE | Freq: Three times a day (TID) | ORAL | Status: DC
  Administered 2020-11-20: 400 mg via ORAL
  Filled 2020-11-20 (×3): qty 1

## 2020-11-20 MED ORDER — NICOTINE 21 MG/24HR TD PT24
21.0000 mg | MEDICATED_PATCH | Freq: Every day | TRANSDERMAL | Status: DC
  Administered 2020-11-20: 21 mg via TRANSDERMAL
  Filled 2020-11-20: qty 1

## 2020-11-20 MED ORDER — FLUTICASONE PROPIONATE 50 MCG/ACT NA SUSP
1.0000 | Freq: Every day | NASAL | Status: DC
  Filled 2020-11-20 (×2): qty 16

## 2020-11-20 MED ORDER — HYDROXYZINE HCL 25 MG PO TABS
25.0000 mg | ORAL_TABLET | Freq: Three times a day (TID) | ORAL | Status: DC | PRN
  Administered 2020-11-20 – 2020-11-21 (×2): 25 mg via ORAL
  Filled 2020-11-20 (×2): qty 1
  Filled 2020-11-20: qty 10

## 2020-11-20 MED ORDER — FOLIC ACID 1 MG PO TABS
1.0000 mg | ORAL_TABLET | Freq: Every day | ORAL | Status: DC
  Administered 2020-11-20 – 2020-11-21 (×2): 1 mg via ORAL
  Filled 2020-11-20 (×6): qty 1

## 2020-11-20 MED ORDER — SERTRALINE HCL 50 MG PO TABS: 50.0000 mg | ORAL_TABLET | Freq: Every day | ORAL | Status: DC

## 2020-11-20 MED ORDER — TRAZODONE HCL 100 MG PO TABS: 50.0000 mg | ORAL_TABLET | Freq: Every evening | ORAL | Status: DC | PRN

## 2020-11-20 MED ORDER — HYDROXYZINE HCL 25 MG PO TABS
25.0000 mg | ORAL_TABLET | Freq: Three times a day (TID) | ORAL | Status: DC | PRN
  Administered 2020-11-20: 25 mg via ORAL
  Filled 2020-11-20: qty 1

## 2020-11-20 MED ORDER — LORAZEPAM 1 MG PO TABS
1.0000 mg | ORAL_TABLET | Freq: Four times a day (QID) | ORAL | Status: DC | PRN
  Administered 2020-11-20 – 2020-11-21 (×2): 1 mg via ORAL
  Filled 2020-11-20 (×3): qty 1

## 2020-11-20 MED ORDER — THIAMINE HCL 100 MG PO TABS
100.0000 mg | ORAL_TABLET | Freq: Every day | ORAL | Status: DC
Start: 2020-11-20 — End: 2020-11-21
  Administered 2020-11-20 – 2020-11-21 (×2): 100 mg via ORAL
  Filled 2020-11-20 (×6): qty 1

## 2020-11-20 MED ORDER — ALUM & MAG HYDROXIDE-SIMETH 200-200-20 MG/5ML PO SUSP: 30.0000 mL | ORAL | Status: DC | PRN

## 2020-11-20 MED ORDER — TRAZODONE HCL 50 MG PO TABS
50.0000 mg | ORAL_TABLET | Freq: Every evening | ORAL | Status: DC | PRN
  Administered 2020-11-20: 50 mg via ORAL
  Filled 2020-11-20: qty 7
  Filled 2020-11-20: qty 1

## 2020-11-20 MED ORDER — PRAZOSIN HCL 2 MG PO CAPS
2.0000 mg | ORAL_CAPSULE | Freq: Every day | ORAL | Status: DC
  Administered 2020-11-20: 2 mg via ORAL
  Filled 2020-11-20 (×2): qty 1
  Filled 2020-11-20: qty 7
  Filled 2020-11-20: qty 2

## 2020-11-20 MED ORDER — ALBUTEROL SULFATE HFA 108 (90 BASE) MCG/ACT IN AERS: 2.0000 | INHALATION_SPRAY | Freq: Four times a day (QID) | RESPIRATORY_TRACT | Status: DC | PRN

## 2020-11-20 MED ORDER — SERTRALINE HCL 50 MG PO TABS
50.0000 mg | ORAL_TABLET | Freq: Every day | ORAL | Status: DC
  Administered 2020-11-20: 50 mg via ORAL
  Filled 2020-11-20 (×3): qty 1
  Filled 2020-11-20: qty 7

## 2020-11-20 MED ORDER — GABAPENTIN 300 MG PO CAPS
400.0000 mg | ORAL_CAPSULE | Freq: Three times a day (TID) | ORAL | Status: DC
  Administered 2020-11-20: 400 mg via ORAL
  Filled 2020-11-20: qty 1

## 2020-11-20 NOTE — Progress Notes (Signed)
Psychoeducational Group Note  Date:  11/20/2020 Time:  2015  Group Topic/Focus:  Wrap up group  Participation Level: Did Not Attend  Participation Quality:  Not Applicable  Affect:  Not Applicable  Cognitive:  Not Applicable  Insight:  Not Applicable  Engagement in Group: Not Applicable  Additional Comments:  Did not attend.   Marcille Buffy 11/20/2020, 9:07 PM

## 2020-11-20 NOTE — ED Notes (Signed)
Transport call for patient to go to [facility]

## 2020-11-20 NOTE — Progress Notes (Signed)
   11/20/20 2258  Psych Admission Type (Psych Patients Only)  Admission Status Involuntary  Psychosocial Assessment  Patient Complaints Anxiety;Irritability  Eye Contact Brief  Facial Expression Anxious  Affect Irritable  Speech Logical/coherent  Interaction Assertive  Motor Activity Other (Comment) (WDL)  Appearance/Hygiene Unremarkable  Behavior Characteristics Anxious;Guarded  Mood Labile;Irritable  Thought Process  Coherency WDL  Content WDL  Delusions None reported or observed  Perception WDL  Hallucination None reported or observed  Judgment Impaired  Confusion None  Danger to Self  Current suicidal ideation? Denies  Danger to Others  Danger to Others None reported or observed

## 2020-11-20 NOTE — ED Provider Notes (Signed)
[facility]  I did not see her this morning because she had been in the emergency department for less than 24 hours.  However, she was evaluated last night by Dr. Audrie Lia.  She was also evaluated this morning by psychiatry.  Unfortunately, I first exam was not completed.  [facility] refused to accept the patient without this paperwork.  I did complete the paperwork so that the patient did not have to come back to the emergency department.   Koleen Distance, MD 11/20/20 [phone]

## 2020-11-20 NOTE — Progress Notes (Signed)
Per Shnese Mills,NP, patient meets criteria for inpatient treatment. There are no available or appropriate beds at [facility] today. CSW faxed referrals to the following facilities for review:   [facility]  Pending - No Request Sent N/A [address] [place] [address] [phone] --  [facility]  Pending - No Request Sent N/A [address] [place] [address] [phone] --  [facility]  Pending - No Request Sent N/A [address] [place] [address] [phone] --  [facility]  Pending - No Request Sent N/A [address], [place] [address] [phone] --  [facility]  Pending - No Request Sent N/A [address], [place] [address] [phone] --  [facility]  Pending - No Request Sent N/A [address] [place] [address] [phone] --  [facility]  Pending - No Request Sent N/A [address] [place] [address] [phone] --  [facility]  Pending - No Request Sent N/A [address] [place] [address] [phone] --  [facility]  Pending - No Request Sent N/A [address] [place] [address] [phone] --  [facility]  Pending - No Request Sent N/A [address] [place] [address] [phone] --  [facility]  Pending - No Request Sent N/A [address], [place] [address] [phone] --  [facility]  Pending - No Request Sent N/A [address], [place] [address] [phone] --  [facility]  Pending - No Request Sent N/A [address] [place] [address]  [phone] --  [facility]  Pending - No Request Sent N/A [address], [place] [address] [phone] --  [facility]  Pending - No Request Sent N/A [address], [place] [address] [phone] --  [facility]  Pending - No Request Sent N/A [address], [place] [address] [phone] --  [facility]  Pending - No Request Sent N/A [address] [place] [address] [phone] --   TTS will continue to seek bed placement.  Tuere Nwosu, MSW, LCSW-A, LCAS-A Phone: [phone] Disposition/TOC  

## 2020-11-20 NOTE — Progress Notes (Signed)
Per Rosey Bath, [facility], pt has been accepted to [facility] bed 307-1. Accepting provider is Vivien Presto, Attending provider is Dr. Jola Babinski. Patient can arrive after 1:30 PM. Number for report is [phone].   Crissie Reese, MSW, LCSW-A Phone: [phone] Disposition/TOC
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
## 2020-11-20 NOTE — Tx Team (Signed)
Initial Treatment Plan 11/20/2020 6:25 PM [name] TDD:[id number]    PATIENT STRESSORS: Loss of relationship Substance abuse Other: homeless   PATIENT STRENGTHS: Average or above average intelligence Communication skills Motivation for treatment/growth Physical Health Work skills   PATIENT IDENTIFIED PROBLEMS: Suicidal ideation    Feeling worthless    "hopelessness"    "No support"         DISCHARGE CRITERIA:  Adequate post-discharge living arrangements Improved stabilization in mood, thinking, and/or behavior Reduction of life-threatening or endangering symptoms to within safe limits Safe-care adequate arrangements made Verbal commitment to aftercare and medication compliance Withdrawal symptoms are absent or subacute and managed without 24-hour nursing intervention  PRELIMINARY DISCHARGE PLAN: Outpatient therapy Placement in alternative living arrangements Return to previous work or school arrangements  PATIENT/FAMILY INVOLVEMENT: This treatment plan has been presented to and reviewed with the patient, [name]. The patient has been given the opportunity to ask questions and make suggestions.  Shela Nevin, RN 11/20/2020, 1600

## 2020-11-20 NOTE — Progress Notes (Addendum)
Patient is a 40 year old female who presented to [facility] under IVC accompanied by [employer]. Pt IVC'd per her father due making suicidal statements, and acting erratically. Pt had a BAL of 153.  Pt has a hx of bipolar 1, BPD, and polysubstance abuse. Pt reported using unprescribed Xanax regularly, but stated that she "doesn't drink that much". Pt presented today with a depressed affect, anxious mood- irritable initially but calmed down towards the end of admission. Pt's speech was logical and coherent throughout the admission interview. Pt expressed feelings of worthlessness and hopelessness, and stated, "I don't have no support". Pt endorsed passive SI- no plan- stating, "I always feel this way". Pt  denied A/VH, and did not appear to be responding to internal stimuli. Admission paperwork completed and signed. Verbal understanding expressed. Skin assessment revealed superficial scratches to bilateral forearms, that pt reported was from her cat. Some bruising was noted on distal inner left forearm, left elbow and left anterior shoulder.  Belongings searched and secured in locker (belt, key, cigarettes and lighter). Pt oriented to the unit and provided with po fluids and sandwich. Q 15 min checks initiated for safety.

## 2020-11-21 DIAGNOSIS — F1020 Alcohol dependence, uncomplicated: Secondary | ICD-10-CM

## 2020-11-21 MED ORDER — POTASSIUM CHLORIDE CRYS ER 20 MEQ PO TBCR
20.0000 meq | EXTENDED_RELEASE_TABLET | Freq: Two times a day (BID) | ORAL | Status: DC
  Filled 2020-11-21 (×2): qty 1

## 2020-11-21 MED ORDER — LORAZEPAM 1 MG PO TABS
ORAL_TABLET | ORAL | Status: AC
  Filled 2020-11-21: qty 1

## 2020-11-21 MED ORDER — PRAZOSIN HCL 2 MG PO CAPS
2.0000 mg | ORAL_CAPSULE | Freq: Every day | ORAL | 0 refills | Status: DC
  Filled 2020-11-21 – 2020-11-30 (×2): qty 30, 30d supply, fill #0

## 2020-11-21 MED ORDER — TRAZODONE HCL 50 MG PO TABS
50.0000 mg | ORAL_TABLET | Freq: Every evening | ORAL | 0 refills | Status: DC | PRN
  Filled 2020-11-21 – 2020-11-30 (×2): qty 30, 30d supply, fill #0

## 2020-11-21 MED ORDER — FLUTICASONE PROPIONATE 50 MCG/ACT NA SUSP: 1.0000 | Freq: Every day | NASAL | 2 refills | Status: DC

## 2020-11-21 MED ORDER — GABAPENTIN 400 MG PO CAPS
400.0000 mg | ORAL_CAPSULE | Freq: Three times a day (TID) | ORAL | 0 refills | Status: DC
  Filled 2020-11-21: qty 90, 30d supply, fill #0

## 2020-11-21 MED ORDER — HYDROXYZINE HCL 25 MG PO TABS
25.0000 mg | ORAL_TABLET | ORAL | Status: AC
  Administered 2020-11-21: 25 mg via ORAL

## 2020-11-21 MED ORDER — HYDROXYZINE HCL 25 MG PO TABS
25.0000 mg | ORAL_TABLET | Freq: Three times a day (TID) | ORAL | 2 refills | Status: DC | PRN
Start: 2020-11-21 — End: 2021-01-24
  Filled 2020-11-21 – 2020-11-30 (×2): qty 75, 25d supply, fill #0

## 2020-11-21 MED ORDER — HYDROXYZINE HCL 25 MG PO TABS
ORAL_TABLET | ORAL | Status: AC
  Filled 2020-11-21: qty 1

## 2020-11-21 MED ORDER — LORAZEPAM 1 MG PO TABS
1.0000 mg | ORAL_TABLET | ORAL | Status: AC
  Administered 2020-11-21: 1 mg via ORAL

## 2020-11-21 MED ORDER — SERTRALINE HCL 50 MG PO TABS
50.0000 mg | ORAL_TABLET | Freq: Every day | ORAL | 0 refills | Status: DC
  Filled 2020-11-21 – 2020-11-30 (×3): qty 30, 30d supply, fill #0
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
## 2020-11-21 NOTE — [facility] Group Notes (Signed)
[facility] Group Notes:  (Nursing/MHT/Case Management/Adjunct)  Date:  11/21/2020  Time:  10:08 AM  Type of Therapy:  Group Therapy  Participation Level:  Active  Participation Quality:  Appropriate  Affect:  Anxious and Appropriate  Cognitive:  Appropriate  Insight:  Appropriate  Engagement in Group:  Engaged  Modes of Intervention:  Discussion  Summary of Progress/Problems:Pt is aware of functions of the unit, and seemed worried about IVC being removed for discharge.  Jaquita Rector 11/21/2020, 10:08 AM
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
## 2020-11-21 NOTE — [facility] Suicide Risk Assessment (Signed)
[facility] Discharge Suicide Risk Assessment   Principal Problem: Alcohol use disorder, severe, dependence (HCC) Discharge Diagnoses: Principal Problem:   Alcohol use disorder, severe, dependence (HCC) Active Problems:   Benzodiazepine dependence, continuous (HCC)   MDD (major depressive disorder), recurrent episode, severe (HCC)   Total Time spent with patient: 30 minutes  Musculoskeletal: Strength & Muscle Tone: within normal limits Gait & Station: normal Patient leans: N/A  Psychiatric Specialty Exam  Presentation  General Appearance: Fairly Groomed  Eye Contact:Fair  Speech:Normal Rate  Speech Volume:Normal  Handedness:Right   Mood and Affect  Mood:Anxious  Duration of Depression Symptoms: Greater than two weeks  Affect:Appropriate   Thought Process  Thought Processes:Coherent  Descriptions of Associations:Intact  Orientation:Full (Time, Place and Person)  Thought Content:Logical  History of Schizophrenia/Schizoaffective disorder:No  Duration of Psychotic Symptoms:No data recorded Hallucinations:Hallucinations: None  Ideas of Reference:None  Suicidal Thoughts:Suicidal Thoughts: No SI Active Intent and/or Plan: With Intent; With Plan; With Means to Carry Out SI Passive Intent and/or Plan: With Intent  Homicidal Thoughts:Homicidal Thoughts: No   Sensorium  Memory:Immediate Good; Recent Good; Remote Good  Judgment:Fair  Insight:Fair   Executive Functions  Concentration:Good  Attention Span:Good  Recall:Fair  Fund of Knowledge:Fair  Language:Good   Psychomotor Activity  Psychomotor Activity:Psychomotor Activity: Normal   Assets  Assets:Desire for Improvement; Resilience   Sleep  Sleep:Sleep: Good Number of Hours of Sleep: 6.75   Physical Exam: Physical Exam Vitals and nursing note reviewed.  HENT:     Head: Normocephalic and atraumatic.  Pulmonary:     Effort: Pulmonary effort is normal.  Neurological:     General: No  focal deficit present.     Mental Status: She is alert and oriented to person, place, and time.   Review of Systems  All other systems reviewed and are negative. Blood pressure (!) 129/96, pulse (!) 127, temperature 98.6 F (37 C), temperature source Oral, resp. rate 20, height 5\' 2"  (1.575 m), weight 59.9 kg, SpO2 96 %. Body mass index is 24.14 kg/m.  Mental Status Per Nursing Assessment::   On Admission:  Suicidal ideation indicated by patient  Demographic Factors:  Caucasian, Gay, lesbian, or bisexual orientation, Low socioeconomic status, and Living alone  Loss Factors: Loss of significant relationship  Historical Factors: Impulsivity  Risk Reduction Factors:   NA  Continued Clinical Symptoms:  Bipolar Disorder:   Bipolar II Depression:   Comorbid alcohol abuse/dependence Impulsivity Alcohol/Substance Abuse/Dependencies More than one psychiatric diagnosis Previous Psychiatric Diagnoses and Treatments  Cognitive Features That Contribute To Risk:  Thought constriction (tunnel vision)    Suicide Risk:  Minimal: No identifiable suicidal ideation.  Patients presenting with no risk factors but with morbid ruminations; may be classified as minimal risk based on the severity of the depressive symptoms   Follow-up Information     [facility] Follow up.   Specialty: Urgent Care Why: Please resume group therapy at discharge and request to be assessed for psychiatric medication management. Contact information: [address] [place] [phone]                Plan Of Care/Follow-up recommendations:  Activity:  ad lib  [phone], MD 11/21/2020, 11:27 AM

## 2020-11-21 NOTE — [facility] Group Notes (Signed)
[facility] Group Notes: (Clinical Social Work)   11/21/2020      Type of Therapy:  Group Therapy   Participation Level:  Did Not Attend - was invited both individually by [facility] and by overhead announcement, chose not to attend.   Trenisha Lafavor Grossman-Orr, LCSW 11/21/2020, 12:58 PM    

## 2020-11-21 NOTE — Progress Notes (Signed)
  [facility] Adult Case Management Discharge Plan :  Will you be returning to the same living situation after discharge:  Yes,  return home with parents.  At discharge, do you have transportation home?: Yes,  pt is working on getting a ride to [employer]; if not, bus pass can be provided.  Do you have the ability to pay for your medications: Yes,  [facility]/mental health  Release of information consent forms completed and submitted to medical records by CSW.   Patient to Follow up at:  Follow-up Information     [facility] Follow up.   Specialty: Urgent Care Why: Please begin virutal Partial Hospitalization Program (PHP) on Monday, 8/15. This program operates Monday-Thursday from 9am-2pm and on Friday from 8am-1pm. Please contact [facility] to schedule your next psychiatric medication management appt when the office opens on Monday, 8/15. Contact information: [address] [place] [address] [phone]                Next level of care provider has access to [facility] Link:yes  Safety Planning and Suicide Prevention discussed: Yes,  with pt; as she declined to consent to family contact.      Has patient been referred to the Quitline?: Patient refused referral  Patient has been referred for addiction treatment: Yes  Rona Ravens, LCSW 11/21/2020, 12:09 PM

## 2020-11-21 NOTE — [facility] Suicide Risk Assessment (Signed)
[facility] INPATIENT:  Family/Significant Other Suicide Prevention Education  Suicide Prevention Education:  Patient Refusal for Family/Significant Other Suicide Prevention Education: The patient [name] has refused to provide written consent for family/significant other to be provided Family/Significant Other Suicide Prevention Education during admission and/or prior to discharge.  Physician notified.  SPE completed with pt, as pt refused to consent to family contact. SPI pamphlet provided to pt and pt was encouraged to share information with support network, ask questions, and talk about any concerns relating to SPE. Pt denies access to guns/firearms and verbalized understanding of information provided. Mobile Crisis information also provided to pt.    Rona Ravens, MSW, LCSW 11/21/2020, 11:56 AM

## 2020-11-21 NOTE — Progress Notes (Signed)
Pt discharged to lobby with friend present to transport pt to parent's house. Pt was stable and appreciative at that time. All papers, samples, and prescriptions were given and valuables returned. Verbal understanding expressed. Denies SI/HI and A/VH. Pt given opportunity to express concerns and ask questions.

## 2020-11-21 NOTE — Progress Notes (Signed)
   11/21/20 1200  Psych Admission Type (Psych Patients Only)  Admission Status Involuntary  Psychosocial Assessment  Patient Complaints Anxiety;Agitation  Eye Contact Brief  Facial Expression Anxious  Affect Irritable  Speech Logical/coherent  Interaction Assertive  Motor Activity Other (Comment) (WDL)  Appearance/Hygiene Unremarkable  Behavior Characteristics Anxious  Mood Anxious  Thought Process  Coherency WDL  Content WDL  Delusions None reported or observed  Perception WDL  Hallucination None reported or observed  Judgment Impaired  Confusion None  Danger to Self  Current suicidal ideation? Denies  Danger to Others  Danger to Others None reported or observed
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
## 2020-11-22 ENCOUNTER — Telehealth (HOSPITAL_COMMUNITY): Payer: Self-pay | Admitting: Professional

## 2020-11-22 ENCOUNTER — Other Ambulatory Visit: Payer: Self-pay

## 2020-11-22 ENCOUNTER — Ambulatory Visit (INDEPENDENT_AMBULATORY_CARE_PROVIDER_SITE_OTHER): Payer: No Payment, Other | Admitting: Professional

## 2020-11-22 DIAGNOSIS — F419 Anxiety disorder, unspecified: Secondary | ICD-10-CM

## 2020-11-22 DIAGNOSIS — F332 Major depressive disorder, recurrent severe without psychotic features: Secondary | ICD-10-CM

## 2020-11-22 DIAGNOSIS — F1320 Sedative, hypnotic or anxiolytic dependence, uncomplicated: Secondary | ICD-10-CM
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
## 2020-11-23 ENCOUNTER — Ambulatory Visit (HOSPITAL_COMMUNITY): Payer: No Payment, Other | Admitting: Licensed Clinical Social Worker

## 2020-11-23 ENCOUNTER — Telehealth (HOSPITAL_COMMUNITY): Payer: Self-pay | Admitting: Professional

## 2020-11-23 ENCOUNTER — Other Ambulatory Visit: Payer: Self-pay

## 2020-11-23 ENCOUNTER — Ambulatory Visit (INDEPENDENT_AMBULATORY_CARE_PROVIDER_SITE_OTHER): Payer: No Payment, Other | Admitting: Professional

## 2020-11-23 DIAGNOSIS — F1320 Sedative, hypnotic or anxiolytic dependence, uncomplicated: Secondary | ICD-10-CM

## 2020-11-23 DIAGNOSIS — F332 Major depressive disorder, recurrent severe without psychotic features: Secondary | ICD-10-CM | POA: Diagnosis not present

## 2020-11-23 DIAGNOSIS — F419 Anxiety disorder, unspecified: Secondary | ICD-10-CM

## 2020-11-23 NOTE — Psych (Signed)
[facility] PHP THERAPIST PROGRESS NOTE  Virtual Visit via Video Note  I connected with [name] on 11/23/20 at  9:00 AM EDT by a video enabled telemedicine application and verified that I am speaking with the correct person using two identifiers.  Location: Patient: Home Provider: Clinical Home Office   I discussed the limitations of evaluation and management by telemedicine and the availability of in person appointments. The patient expressed understanding and agreed to proceed.  Follow Up Instructions:    I discussed the assessment and treatment plan with the patient. The patient was provided an opportunity to ask questions and all were answered. The patient agreed with the plan and demonstrated an understanding of the instructions.   The patient was advised to call back or seek an in-person evaluation if the symptoms worsen or if the condition fails to improve as anticipated.  I provided 240 minutes of non-face-to-face time during this encounter.   [name], LCMHCA      [name] [id number]  Session Time: 9-10  Participation Level: Active  Behavioral Response: CasualAlertAnxious  Type of Therapy: Group Therapy  Treatment Goals addressed: Coping  Interventions: CBT, DBT, Solution Focused, Strength-based, Supportive, and Reframing  Summary: Clinician led check-in regarding current stressors and situation, and patient completed verbal daily inventory. Clinician utilized active listening and empathetic response and validated patient emotions. Clinician facilitated processing group on pertinent issues.   Therapist Response: [name] is a 40 y.o. female who presents with with depression and anxiety symptoms. Patient arrived within time allowed and reports that she is feeling "down." Patient rates her mood at a 2 on a scale of 1-10 with 10 being great. Pt reports she is stressed due to a conversation she had with her ex this  morning. Pt reports she spent the afternoon yesterday with her ex, and things went well until this morning. Pt able to process. Pt engaged in discussion.    Session Time: 10-11  Participation Level: Active  Behavioral Response: CasualAlertAnxious  Type of Therapy: Group Therapy  Treatment Goals addressed: Coping  Interventions: CBT, DBT, Solution Focused, Strength-based, Supportive, and Reframing  Summary: Clinician introduced topic of "Radical Acceptance."  Group discussed how and when radical acceptance can be used and helpful.   Therapist Response: Pt engaged in discussion. Pt reports needing to practice radical acceptance with her current situation with her ex and job.    Session Time: 11-12  Participation Level: Active  Behavioral Response: CasualAlertAnxious  Type of Therapy: Group Therapy   Treatment Goals addressed: Coping   Interventions: Supportive and Reframing   Summary:  Spiritual care group.   Therapist Response: Pt engaged in discussion. See Chaplin note.   Session Time: 12-1  Participation Level: Active  Behavioral Response: CasualAlertAnxious and Irritable  Type of Therapy: Group Therapy  Treatment Goals addressed: Coping  Interventions: CBT, DBT, Solution Focused, Strength-based, Supportive, and Reframing  Summary: 12:00 - 12:50: Clinician continued needs assessment. Patients identified and discussed areas of life where improvements need to be made. 12:50 -1:00 Clinician led check-out. Clinician assessed for immediate needs, medication compliance and efficacy, and safety concerns   Therapist Response: 12:00 - 12:50: Pt engaged in discussion. Pt reports she needs improvements in self-esteem, self-respect, and independence. 12:50 - 1:00: At check-out, patient rates her mood at a 5 on a scale of 1-10 with 10 being great. Pt reports afternoon plans of "sleeping and being depressed." Cln and pt spend time discussing options for other activities  patients can participate in, such as listening to music/sermons, reading lyrics, or walking her dog. Patient reports she will try these skills. Patient demonstrates some progress as evidenced by being open to challenges to use new skills. Patient denies SI/HI/self-harm at the end of group.   Suicidal/Homicidal: Nowithout intent/plan  Plan: Patient will continue in PHP while working on symptoms related to depression and anxiety.  Diagnosis: Major depressive disorder, recurrent severe without psychotic features (HCC) [F33.2]    1. Major depressive disorder, recurrent severe without psychotic features (HCC)   2. Anxiety   3. Benzodiazepine dependence, continuous (HCC)       [name], [facility] 11/23/2020

## 2020-11-24 ENCOUNTER — Ambulatory Visit (INDEPENDENT_AMBULATORY_CARE_PROVIDER_SITE_OTHER): Payer: No Payment, Other | Admitting: Professional

## 2020-11-24 ENCOUNTER — Encounter (HOSPITAL_COMMUNITY): Payer: Self-pay | Admitting: Professional

## 2020-11-24 DIAGNOSIS — F1320 Sedative, hypnotic or anxiolytic dependence, uncomplicated: Secondary | ICD-10-CM

## 2020-11-24 DIAGNOSIS — F419 Anxiety disorder, unspecified: Secondary | ICD-10-CM

## 2020-11-24 DIAGNOSIS — F331 Major depressive disorder, recurrent, moderate: Secondary | ICD-10-CM

## 2020-11-24 DIAGNOSIS — F332 Major depressive disorder, recurrent severe without psychotic features: Secondary | ICD-10-CM

## 2020-11-24 NOTE — Progress Notes (Signed)
Virtual Visit via Video Note  I connected with [name] on 11/24/20 at  9:00 AM EDT by a video enabled telemedicine application and verified that I am speaking with the correct person using two identifiers.  Location: Patient: Home Provider: Office   I discussed the limitations of evaluation and management by telemedicine and the availability of in person appointments. The patient expressed understanding and agreed to proceed.    I discussed the assessment and treatment plan with the patient. The patient was provided an opportunity to ask questions and all were answered. The patient agreed with the plan and demonstrated an understanding of the instructions.   The patient was advised to call back or seek an in-person evaluation if the symptoms worsen or if the condition fails to improve as anticipated.  I provided 15 minutes of non-face-to-face time during this encounter.   [name], NP   [facility] partial hospitalization Program   Psychiatric Initial Adult Assessment   Patient Identification: [name] MRN:  [id number] Date of Evaluation:  11/24/2020 Referral Source: Outpatient provider Chief Complaint:    Major depressive Visit Diagnosis:    ICD-10-CM   1. MDD (major depressive disorder), recurrent episode, moderate (HCC)  F33.1     2. Anxiety  F41.9       History of Present Illness:  [name] is a 40 year-old female that was referred by her outpatient provider. Patient was recently discharged from inpatient admission due to worsening depression and chronic suicidal ideations.  Patient continues to request Xanax refills.  She reports a recent break-up with her girlfriend.  States she feels lonely and helpless.  States she continues to work to occupy her time.  States that she believes her father initiated involuntary commitment.  States that she was drinking and using Xanax off the streets which caused her mood and her thoughts to  become altered.  Reports multiple inpatient admissions.  Reports chronic suicidal ideations without intent or plan.  Patient was recently seen by this provider with medication refills to Zoloft 50 mg daily, Minipress 2 mg nightly, trazodone 50 mg daily, hydroxyzine 25 mg p.o. 3 times daily as needed.  And gabapentin 400 mg p.o. 3 times daily.  She reports financial concerns with picking up medications.  However was recently discharged with medication refills states that she should be able to continue her medications in 1 week.  Patient to start partial hospitalization programming 11/23/2020  Associated Signs/Symptoms: Depression Symptoms:  difficulty concentrating, hopelessness, suicidal thoughts without plan, anxiety, (Hypo) Manic Symptoms:  Distractibility, Irritable Mood, Labiality of Mood, Anxiety Symptoms:  Excessive Worry, Social Anxiety, Psychotic Symptoms:   N/A PTSD Symptoms: NA  Past Psychiatric History:   Previous Psychotropic Medications: No   Substance Abuse History in the last 12 months:  Yes.    Consequences of Substance Abuse: NA  Past Medical History:  Past Medical History:  Diagnosis Date   Anxiety    Benzodiazepine abuse (HCC)    Bipolar 1 disorder (HCC)    Depression    Drug abuse (HCC)    ETOH abuse    Polysubstance abuse (HCC)    No past surgical history on file.  Family Psychiatric History:   Family History: No family history on file.  Social History:   Social History   Socioeconomic History   Marital status: Single    Spouse name: Not on file   Number of children: Not on file   Years of education: Not on file  Highest education level: Not on file  Occupational History   Not on file  Tobacco Use   Smoking status: Every Day    Packs/day: 2.00    Years: 17.00    Pack years: 34.00    Types: Cigarettes   Smokeless tobacco: Never  Vaping Use   Vaping Use: Never used  Substance and Sexual Activity   Alcohol use: Yes    Comment: +63    Drug use: Yes    Types: Marijuana, IV    Comment: UDS + THC   Sexual activity: Never  Other Topics Concern   Not on file  Social History Narrative   Not on file   Social Determinants of Health   Financial Resource Strain: Not on file  Food Insecurity: Not on file  Transportation Needs: Not on file  Physical Activity: Not on file  Stress: Not on file  Social Connections: Not on file    Additional Social History:   Allergies:   Allergies  Allergen Reactions   Haldol [Haloperidol Lactate] Other (See Comments)    Makes whole body stiff    Metabolic Disorder Labs: Lab Results  Component Value Date   HGBA1C 5.2 07/12/2020   MPG 102.54 07/12/2020   No results found for: PROLACTIN Lab Results  Component Value Date   CHOL 123 07/12/2020   TRIG 56 07/12/2020   HDL 39 (L) 07/12/2020   CHOLHDL 3.2 07/12/2020   VLDL 11 07/12/2020   LDLCALC 73 07/12/2020   Lab Results  Component Value Date   TSH 0.906 07/12/2020    Therapeutic Level Labs: No results found for: LITHIUM Lab Results  Component Value Date   CBMZ <2.0 (L) 11/21/2016   Lab Results  Component Value Date   VALPROATE 74.4 04/26/2010    Current Medications: Current Outpatient Medications  Medication Sig Dispense Refill   albuterol (VENTOLIN HFA) 108 (90 Base) MCG/ACT inhaler Inhale 2 puffs into the lungs every 6 (six) hours as needed for wheezing or shortness of breath. 8.5 g 1   fluticasone (FLONASE) 50 MCG/ACT nasal spray Place 1 spray into both nostrils daily. For allergies  2   gabapentin (NEURONTIN) 400 MG capsule Take 1 capsule (400 mg total) by mouth 3 (three) times daily. For agitation 90 capsule 0   hydrOXYzine (ATARAX/VISTARIL) 25 MG tablet Take 1 tablet (25 mg total) by mouth 3 (three) times daily as needed for anxiety. 75 tablet 2   omeprazole (PRILOSEC OTC) 20 MG tablet Take 20 mg by mouth daily.     prazosin (MINIPRESS) 2 MG capsule Take 1 capsule (2 mg total) by mouth at bedtime. For  nightmares 30 capsule 0   sertraline (ZOLOFT) 50 MG tablet Take 1 tablet (50 mg total) by mouth at bedtime. For depression 30 tablet 0   traZODone (DESYREL) 50 MG tablet Take 1 tablet (50 mg total) by mouth at bedtime as needed for sleep. 30 tablet 0   No current facility-administered medications for this visit.    Musculoskeletal: Strength & Muscle Tone: within normal limits Gait & Station: normal Patient leans: N/A  Psychiatric Specialty Exam: Review of Systems  There were no vitals taken for this visit.There is no height or weight on file to calculate BMI.  General Appearance: Casual  Eye Contact:  Good  Speech:  Clear and Coherent  Volume:  Normal  Mood:  Anxious and Depressed  Affect:  Congruent  Thought Process:  Coherent  Orientation:  Full (Time, Place, and Person)  Thought Content:  Logical  Suicidal Thoughts:  Yes.  without intent/plan  Homicidal Thoughts:  No  Memory:  Immediate;   Fair Recent;   Fair  Judgement:  Fair  Insight:  Fair  Psychomotor Activity:  Normal  Concentration:  Concentration: Good  Recall:  Good  Fund of Knowledge:Good  Language: Fair  Akathisia:  No  Handed:  Right  AIMS (if indicated):  done  Assets:  Desire for Improvement Resilience Social Support  ADL's:  Intact  Cognition: WNL  Sleep:  Good   Screenings: AIMS    Flowsheet Row [facility]) [facility]  AIMS Total Score 0 0      AUDIT    Flowsheet Row [facility]) [facility] (Dis[facility] ([facility]) [facility] ([facility]) [facility]  Alcohol Use Disorder Identification Test Final Score (AUDIT) 2 2 22 29  33      GAD-7    Flowsheet Row Counselor from 11/17/2020 in [facility] Office Visit from 10/08/2020 in [facility] Office Visit from 06/05/2016 in [facility] Office Visit from 04/05/2016 in [facility] Office Visit from 02/24/2016 in [facility]  Total GAD-7 Score 20 19 11 9 10       PHQ2-9    Flowsheet Row Counselor from 11/17/2020 in [facility] Office Visit from 10/08/2020 in [facility] Video Visit from 07/16/2020 in [facility] Office Visit from 06/05/2016 in [facility] Office Visit from 04/05/2016 in [facility]  PHQ-2 Total Score 6 6 0 2 2  PHQ-9 Total Score 26 27 -- 9 9      Flowsheet Row [facility]) [facility] from 11/19/2020 in [facility] Counselor from 11/17/2020 in [facility]  C-SSRS RISK CATEGORY Low Risk Moderate Risk Error: Q3, 4, or 5 should not be populated when Q2 is No       Assessment and Plan:   Patient to start partial hospitalization programming- [facility]   Continue Zoloft 50 mg p.o. daily Continue Minipress 2 mg p.o. nightly Continue trazodone 50 mg p.o. daily Continue hydroxyzine 25 mg p.o. 3 times daily as needed Continue gabapentin 400 mg p.o. 3 times daily  Treatment plan was reviewed and agreed upon by NP [facility] and patient [name] need for group service.    Melvyn Neth, NP 8/17/202210:47 AM

## 2020-11-25 ENCOUNTER — Ambulatory Visit (INDEPENDENT_AMBULATORY_CARE_PROVIDER_SITE_OTHER): Payer: No Payment, Other | Admitting: Professional

## 2020-11-25 ENCOUNTER — Other Ambulatory Visit: Payer: Self-pay

## 2020-11-25 ENCOUNTER — Encounter (HOSPITAL_COMMUNITY): Payer: Self-pay

## 2020-11-25 DIAGNOSIS — F1320 Sedative, hypnotic or anxiolytic dependence, uncomplicated: Secondary | ICD-10-CM

## 2020-11-25 DIAGNOSIS — F332 Major depressive disorder, recurrent severe without psychotic features: Secondary | ICD-10-CM | POA: Diagnosis not present

## 2020-11-25 DIAGNOSIS — F419 Anxiety disorder, unspecified: Secondary | ICD-10-CM
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
## 2020-11-25 NOTE — Psych (Signed)
[facility] PHP THERAPIST PROGRESS NOTE  Virtual Visit via Video Note  I connected with [name] on 11/25/20 at  9:00 AM EDT by a video enabled telemedicine application and verified that I am speaking with the correct person using two identifiers.  Location: Patient: Home Provider: Clinical Home Office   I discussed the limitations of evaluation and management by telemedicine and the availability of in person appointments. The patient expressed understanding and agreed to proceed.  Follow Up Instructions:    I discussed the assessment and treatment plan with the patient. The patient was provided an opportunity to ask questions and all were answered. The patient agreed with the plan and demonstrated an understanding of the instructions.   The patient was advised to call back or seek an in-person evaluation if the symptoms worsen or if the condition fails to improve as anticipated.  I provided 240 minutes of non-face-to-face time during this encounter.   [name], L[facility]      [name] [id number]  Session Time: 9-10  Participation Level: Active  Behavioral Response: CasualAlertAnxious  Type of Therapy: Group Therapy  Treatment Goals addressed: Coping  Interventions: CBT, DBT, Solution Focused, Strength-based, Supportive, and Reframing  Summary: Clinician led check-in regarding current stressors and situation, and patient completed verbal daily inventory. Clinician utilized active listening and empathetic response and validated patient emotions. Clinician facilitated processing group on pertinent issues.   Therapist Response: [name] is a 40 y.o. female who presents with with depression and anxiety symptoms. Patient arrived within time allowed and reports that she is feeling "better than yesterday." Patient rates her mood at a 4 on a scale of 1-10 with 10 being great. Pt reports she was able to nap yesterday which helped. She also  participated in writing her positive journal entry and taking her dog on a walk. Pt reports continued stress related to relationship with ex-girlfriend. Pt able to process. Pt engaged in discussion.    Session Time: 10-11  Participation Level: Active  Behavioral Response: CasualAlertAnxious  Type of Therapy: Group Therapy  Treatment Goals addressed: Coping  Interventions: CBT, DBT, Solution Focused, Strength-based, Supportive, and Reframing  Summary: Clinician introduced "Mindfulness". Clinician showed TedTalk on mindfulness and the group discussed.   Therapist Response: Pt engaged in discussion. Pt reports being good at mindfulness. After further discussion, pt reports she has many ways to improve.    Session Time: 11-12  Participation Level: Active  Behavioral Response: CasualAlertAnxious  Type of Therapy: Group Therapy   Treatment Goals addressed: Coping   Interventions: CBT, DBT, Solution Focused, Strength-based, Supportive, and Reframing  Summary: Clinician introduced "Mindfulness". Group discussed "What" and "How" skills of mindfulness.  Therapist Response: Pt engaged in discussion. Pt reports needing to work on not judging herself when she doesn't do everything "exactly right."    Session Time: 12-1  Participation Level: Active  Behavioral Response: CasualAlertAnxious and Irritable  Type of Therapy: Group Therapy  Treatment Goals addressed: Coping  Interventions: CBT, DBT, Solution Focused, Strength-based, Supportive, and Reframing  Summary: 12:00 - 12:50: Group continued with "Mindfulness" topic.  Patients identified what types of activities would help them practice mindfulness. Patients took part in Progressive Muscle Relaxation and other activities and discussed which would work best for them. 12:50 -1:00 Clinician led check-out. Clinician assessed for immediate needs, medication compliance and efficacy, and safety concerns   Therapist Response: 12:00 -  12:50: Pt engaged in discussion. Pt reports she will try 5-4-3-2-1 activity to practice  mindfulness. Pt reports she will work on being mindful in the shower. 12:50 - 1:00: At check-out, patient rates her mood at a 5 on a scale of 1-10 with 10 being great. Pt reports afternoon plans of running errands. Patient demonstrates some progress as evidenced by increased mood. Patient denies SI/HI/self-harm at the end of group.   Suicidal/Homicidal: Nowithout intent/plan  Plan: Patient will continue in PHP while working on symptoms related to depression and anxiety.  Diagnosis: Major depressive disorder, recurrent severe without psychotic features (HCC) [F33.2]    1. Major depressive disorder, recurrent severe without psychotic features (HCC)   2. Anxiety   3. Benzodiazepine dependence, continuous (HCC)       [name], [facility] 11/25/2020

## 2020-11-25 NOTE — Psych (Signed)
[facility] PHP THERAPIST PROGRESS NOTE  Virtual Visit via Video Note  I connected with [name] on 11/25/20 at  9:00 AM EDT by a video enabled telemedicine application and verified that I am speaking with the correct person using two identifiers.  Location: Patient: Home Provider: Clinical Home Office   I discussed the limitations of evaluation and management by telemedicine and the availability of in person appointments. The patient expressed understanding and agreed to proceed.  Follow Up Instructions:    I discussed the assessment and treatment plan with the patient. The patient was provided an opportunity to ask questions and all were answered. The patient agreed with the plan and demonstrated an understanding of the instructions.   The patient was advised to call back or seek an in-person evaluation if the symptoms worsen or if the condition fails to improve as anticipated.  I provided 240 minutes of non-face-to-face time during this encounter.   [name], L[facility]      [name] [id number]  Session Time: 9-10  Participation Level: Active  Behavioral Response: CasualAlertAnxious  Type of Therapy: Group Therapy  Treatment Goals addressed: Coping  Interventions: CBT, DBT, Solution Focused, Strength-based, Supportive, and Reframing  Summary: Clinician led check-in regarding current stressors and situation, and patient completed verbal daily inventory. Clinician utilized active listening and empathetic response and validated patient emotions. Clinician facilitated processing group on pertinent issues.   Therapist Response: [name] is a 40 y.o. female who presents with with depression and anxiety symptoms. Patient arrived within time allowed and reports that she is feeling "a little better than yesterday." Patient rates her mood at a 4 on a scale of 1-10 with 10 being great. Pt reports she is feeling better than yesterday due to  sleeping and trying coping skills. Pt reports she wrote her positive journal entry yesterday and took her dog for a walk. Pt continues to report confusion related to feelings about her ex-girlfriend. Pt able to process. Pt engaged in discussion.    Session Time: 10-11  Participation Level: Active  Behavioral Response: CasualAlertAnxious  Type of Therapy: Group Therapy  Treatment Goals addressed: Coping  Interventions: CBT, DBT, Solution Focused, Strength-based, Supportive, and Reframing  Summary: Clinician introduced topic of "Mindfulness". Clinician showed TedTalk on mindfulness and the group discussed.  Therapist Response: Pt engaged in discussion. Pt reports she is good at being mindful. After further discussion, pt realized she needs improvement in being mindful instead of trying to avoid obstacles in life.    Session Time: 11-12  Participation Level: Active  Behavioral Response: CasualAlertAnxious  Type of Therapy: Group Therapy   Treatment Goals addressed: Coping   Interventions: CBT, DBT, Solution Focused, Strength-based, Supportive, and Reframing  Summary: Clinician continued topic of "Mindfulness". Group discussed "What" and "How" skills of mindfulness.  Therapist Response: Pt engaged in discussion. Pt reports she needs to .    Session Time: 12-1  Participation Level: Active  Behavioral Response: CasualAlertAnxious and Irritable  Type of Therapy: Group Therapy  Treatment Goals addressed: Coping  Interventions: CBT, DBT, Solution Focused, Strength-based, Supportive, and Reframing  Summary: 12:00 - 12:50: Clinician discussed topic of "walls" and numbing self to emotions. Patients identified and discussed areas of life where improvements need to be made. 12:50 -1:00 Clinician led check-out. Clinician assessed for immediate needs, medication compliance and efficacy, and safety concerns   Therapist Response: 12:00 - 12:50: Pt engaged in discussion. Pt reports  she needs improvements in building walls and  not allowing others in. 12:50 - 1:00: At check-out, patient rates her mood at a 3 on a scale of 1-10 with 10 being great. Pt reports afternoon plans of taking a nap and a walk. Patient demonstrates some progress as evidenced by using new skills to manage symptoms. Patient denies SI/HI/self-harm at the end of group.   Suicidal/Homicidal: Nowithout intent/plan  Plan: Patient will continue in PHP while working on symptoms related to depression and anxiety.  Diagnosis: Major depressive disorder, recurrent severe without psychotic features (HCC) [F33.2]    1. Major depressive disorder, recurrent severe without psychotic features (HCC)   2. Anxiety   3. Benzodiazepine dependence, continuous (HCC)       [name], [facility] 11/25/2020

## 2020-11-25 NOTE — Psych (Signed)
[facility] PHP THERAPIST PROGRESS NOTE  Virtual Visit via Video Note  I connected with [name] on 11/24/20 at  9:00 AM EDT by a video enabled telemedicine application and verified that I am speaking with the correct person using two identifiers.  Location: Patient: Home Provider: Clinical Home Office   I discussed the limitations of evaluation and management by telemedicine and the availability of in person appointments. The patient expressed understanding and agreed to proceed.  Follow Up Instructions:    I discussed the assessment and treatment plan with the patient. The patient was provided an opportunity to ask questions and all were answered. The patient agreed with the plan and demonstrated an understanding of the instructions.   The patient was advised to call back or seek an in-person evaluation if the symptoms worsen or if the condition fails to improve as anticipated.  I provided 240 minutes of non-face-to-face time during this encounter.   [name], L[facility]      Adaysha Dubinsky [phone]  Session Time: 9-10  Participation Level: Active  Behavioral Response: CasualAlertAnxious  Type of Therapy: Group Therapy  Treatment Goals addressed: Coping  Interventions: CBT, DBT, Solution Focused, Strength-based, Supportive, and Reframing  Summary: Clinician led check-in regarding current stressors and situation, and patient completed verbal daily inventory. Clinician utilized active listening and empathetic response and validated patient emotions. Clinician facilitated processing group on pertinent issues.   Therapist Response: [name] is a 40 y.o. female who presents with with depression and anxiety symptoms. Patient arrived within time allowed and reports that she is feeling "upset." Patient rates her mood at a 1 on a scale of 1-10 with 10 being great. Pt reports she is stressed due to her current relationship with her ex and the  "push and pull" of not knowing where things stand. Pt reports she did not sleep due to the anxiety. Pt able to process. Pt engaged in discussion.    Session Time: 10-11  Participation Level: Active  Behavioral Response: CasualAlertAnxious  Type of Therapy: Group Therapy  Treatment Goals addressed: Coping  Interventions: CBT, DBT, Solution Focused, Strength-based, Supportive, and Reframing  Summary: Clinician introduced topic of "Positive Psychology". Group watched "Positive Psychology" Ted-Talk. Patients discussed how their "lens" of life effects the way they feel.  Therapist Response: Pt engaged in discussion. Pt reports wanting to learn to change her lens to have a more positive outlook.    Session Time: 11-12  Participation Level: Active  Behavioral Response: CasualAlertAnxious  Type of Therapy: Group Therapy   Treatment Goals addressed: Coping   Interventions: CBT, DBT, Solution Focused, Strength-based, Supportive, and Reframing  Summary: Clinician continued topic of "Positive Psychology". Group discussed 5 strategies to help change lens. Patients identified one strategy they would be willing to try to change their "lens" for at least 21 days to create a new habit.  Therapist Response: Pt engaged in discussion. Pt reports she will try positive journaling and 3 gratitudes.    Session Time: 12-1  Participation Level: Active  Behavioral Response: CasualAlertAnxious and Irritable  Type of Therapy: Group Therapy  Treatment Goals addressed: Coping  Interventions: CBT, DBT, Solution Focused, Strength-based, Supportive, and Reframing  Summary: 12:00 - 12:50: Clinician discussed topic of "walls" and numbing self to emotions. Patients identified and discussed areas of life where improvements need to be made. 12:50 -1:00 Clinician led check-out. Clinician assessed for immediate needs, medication compliance and efficacy, and safety concerns   Therapist Response: 12:00 -  12:50: Pt engaged in discussion. Pt reports she needs improvements in building walls and not allowing others in. 12:50 - 1:00: At check-out, patient rates her mood at a 3 on a scale of 1-10 with 10 being great. Pt reports afternoon plans of taking a nap and a walk. Patient demonstrates some progress as evidenced by using new skills to manage symptoms. Patient denies SI/HI/self-harm at the end of group.   Suicidal/Homicidal: Nowithout intent/plan  Plan: Patient will continue in PHP while working on symptoms related to depression and anxiety.  Diagnosis: MDD (major depressive disorder), recurrent episode, moderate (HCC) [F33.1]    1. MDD (major depressive disorder), recurrent episode, moderate (HCC)   2. Anxiety   3. Major depressive disorder, recurrent severe without psychotic features (HCC)   4. Benzodiazepine dependence, continuous (HCC)       [name], [facility] 11/24/2020

## 2020-11-26 ENCOUNTER — Ambulatory Visit (INDEPENDENT_AMBULATORY_CARE_PROVIDER_SITE_OTHER): Payer: No Payment, Other | Admitting: Professional

## 2020-11-26 DIAGNOSIS — F332 Major depressive disorder, recurrent severe without psychotic features: Secondary | ICD-10-CM

## 2020-11-26 DIAGNOSIS — F419 Anxiety disorder, unspecified: Secondary | ICD-10-CM

## 2020-11-26 DIAGNOSIS — F1320 Sedative, hypnotic or anxiolytic dependence, uncomplicated: Secondary | ICD-10-CM

## 2020-11-29 ENCOUNTER — Other Ambulatory Visit: Payer: Self-pay

## 2020-11-29 ENCOUNTER — Ambulatory Visit (INDEPENDENT_AMBULATORY_CARE_PROVIDER_SITE_OTHER): Payer: No Payment, Other | Admitting: Professional

## 2020-11-29 DIAGNOSIS — F332 Major depressive disorder, recurrent severe without psychotic features: Secondary | ICD-10-CM | POA: Diagnosis not present

## 2020-11-29 DIAGNOSIS — F1320 Sedative, hypnotic or anxiolytic dependence, uncomplicated: Secondary | ICD-10-CM

## 2020-11-29 DIAGNOSIS — F419 Anxiety disorder, unspecified: Secondary | ICD-10-CM

## 2020-11-29 NOTE — Psych (Signed)
Virtual Visit via Video Note  I connected with [name] on 11/29/20 at  9:00 AM EDT by a video enabled telemedicine application and verified that I am speaking with the correct person using two identifiers.  Location: Patient: Home Provider: Clinical Home Office   I discussed the limitations of evaluation and management by telemedicine and the availability of in person appointments. The patient expressed understanding and agreed to proceed.  Follow Up Instructions:    I discussed the assessment and treatment plan with the patient. The patient was provided an opportunity to ask questions and all were answered. The patient agreed with the plan and demonstrated an understanding of the instructions.   The patient was advised to call back or seek an in-person evaluation if the symptoms worsen or if the condition fails to improve as anticipated.  I provided 240 minutes of non-face-to-face time during this encounter.   [name], LCM[facility]    [name] [id number]  Session Time: 9-10  Participation Level: Active  Behavioral Response: CasualAlertAnxious  Type of Therapy: Group Therapy  Treatment Goals addressed: Coping  Interventions: CBT, DBT, Solution Focused, Strength-based, Supportive, and Reframing  Summary: Clinician led check-in regarding current stressors and situation, and patient completed verbal daily inventory. Clinician utilized active listening and empathetic response and validated patient emotions. Clinician facilitated processing group on pertinent issues.   Therapist Response: [name] is a 40 y.o. female who presents with with depression and anxiety symptoms. Patient arrived within time allowed and reports that she is feeling "just kinda here, but not super depressed." Patient rates her mood at a 4 on a scale of 1-10 with 10 being great. Pt reports she continues to try to "step back" from her current situation with her  ex-girlfriend. Pt reports she continues to work on her positive journaling. Pt reports others have noticed the changes she is making. Pt able to process. Pt engaged in discussion.    Session Time: 10-11  Participation Level: Active  Behavioral Response: CasualAlertAnxious  Type of Therapy: Group Therapy  Treatment Goals addressed: Coping  Interventions: CBT, DBT, Solution Focused, Strength-based, Supportive, and Reframing  Summary: Clinician introduced topic of "Values and Goals". Group discussed how values change over time. Pt participated in "[employer]" activity to help identify values.  Therapist Response: Pt engaged in discussion. Pt reports not knowing current values and wanting to explore further.   Session Time: 11-12  Participation Level: Active  Behavioral Response: CasualAlertAnxious  Type of Therapy: Group Therapy   Treatment Goals addressed: Coping   Interventions: CBT, DBT, Solution Focused, Strength-based, Supportive, and Reframing  Summary: Clinician continued topic of "Values". Group participated in values sort to narrow down and understand current values.   Therapist Response: Pt engaged in discussion. Pt reports having a better understanding of values, including tending to relationships, spiritual care, and being self-directed.    Session Time: 12-1  Participation Level: Active  Behavioral Response: CasualAlertAnxious and Irritable  Type of Therapy: Group Therapy  Treatment Goals addressed: Coping  Interventions: CBT, DBT, Solution Focused, Strength-based, Supportive, and Reframing  Summary: 12:00 - 12:50: Clinician continued topic of "Values and Goals". Group discussed how goals and values are related. Group discussed SMART goals. 12:50 -1:00 Clinician led check-out. Clinician assessed for immediate needs, medication compliance and efficacy, and safety concerns   Therapist Response: 12:00 - 12:50: Pt engaged in discussion. Pt reports she  wants to work on goals related to managing emotions to be more self-directed. 12:50 -  1:00: At check-out, patient rates her mood at a 5 on a scale of 1-10 with 10 being great. Pt reports weekend plans of spending time with ex-girlfriend and reviewing paperwork from group. Patient demonstrates some progress as evidenced by decrease in impulsive behaviors related to emotional outbursts. Patient denies SI/HI/self-harm at the end of group.   Suicidal/Homicidal: Nowithout intent/plan  Plan: Patient will continue in PHP while working on symptoms related to depression and anxiety.  Diagnosis: Major depressive disorder, recurrent severe without psychotic features (HCC) [F33.2]    1. Major depressive disorder, recurrent severe without psychotic features (HCC)   2. Anxiety   3. Benzodiazepine dependence, continuous (HCC)        [name], [facility] 11/26/2020

## 2020-11-29 NOTE — Psych (Signed)
Virtual Visit via Video Note  I connected with [name] on 11/29/20 at  9:00 AM EDT by a video enabled telemedicine application and verified that I am speaking with the correct person using two identifiers.  Location: Patient: Home Provider: Clinical Home Office   I discussed the limitations of evaluation and management by telemedicine and the availability of in person appointments. The patient expressed understanding and agreed to proceed.  Follow Up Instructions:    I discussed the assessment and treatment plan with the patient. The patient was provided an opportunity to ask questions and all were answered. The patient agreed with the plan and demonstrated an understanding of the instructions.   The patient was advised to call back or seek an in-person evaluation if the symptoms worsen or if the condition fails to improve as anticipated.  I provided 240 minutes of non-face-to-face time during this encounter.   [name], [facility]    [name] [phone]  Session Time: 8-9  Participation Level: Active  Behavioral Response: CasualAlertAnxious  Type of Therapy: Group Therapy  Treatment Goals addressed: Coping  Interventions: CBT, DBT, Solution Focused, Strength-based, Supportive, and Reframing  Summary: Clinician led check-in regarding current stressors and situation, and patient completed verbal daily inventory. Clinician utilized active listening and empathetic response and validated patient emotions. Clinician facilitated processing group on pertinent issues.   Therapist Response: Cydney Alvarenga is a 40 y.o. female who presents with with depression and anxiety symptoms. Patient arrived within time allowed and reports that she is feeling "irritable." Patient rates her mood at a 5 on a scale of 1-10 with 10 being great. Pt reports she had a good and bad weekend; good with ex-girlfriend and bad with interaction with mother. Pt reports her  mother is sharing pt's mental health "business" with others and pt does not like it. Pt able to process. Pt engaged in discussion.    Session Time: 9-10  Participation Level: Active  Behavioral Response: CasualAlertAnxious  Type of Therapy: Group Therapy  Treatment Goals addressed: Coping  Interventions: CBT, DBT, Solution Focused, Strength-based, Supportive, and Reframing  Summary: Clinician introduced topic of "Communication". Clinician explained the difference between types of communication, communication styles, and healthy/unhealthy communication.  Patients identified communication styles in short video clips.  Patients identified their main form of communication and reasons why it works, or reasons to think about changing it.   Therapist Response: Pt engaged in discussion. Pt reports being aggressive in a lot of communication.   Session Time: 10-11  Participation Level: Active  Behavioral Response: CasualAlertAnxious  Type of Therapy: Group Therapy   Treatment Goals addressed: Coping   Interventions: CBT, DBT, Solution Focused, Strength-based, Supportive, and Reframing  Summary: Clinician continued topic of communication. Clinician educated on non-verbal aspect of communication. Group discussed importance of non-verbal communications and limitations of communication done not in-person.  Therapist Response: Pt engaged in discussion. Pt shares scenarios when non-verbal communications have not been used and communication "went [employer] Time: 11-12  Participation Level: Active  Behavioral Response: CasualAlertAnxious and Irritable  Type of Therapy: Group Therapy  Treatment Goals addressed: Coping  Interventions: CBT, DBT, Solution Focused, Strength-based, Supportive, and Reframing  Summary: 11:00 - 11:50: Cln provided psychoeducation on Active Listening and how it can aid in communication. 11:50 -12:00 Clinician led check-out. Clinician assessed for  immediate needs, medication compliance and efficacy, and safety concerns   Therapist Response: 12:00 - 12:50: Pt engaged in discussion. Pt reports she wants  to work on active listening. 12:50 - 1:00: At check-out, patient rates her mood at a 5 on a scale of 1-10 with 10 being great. Pt reports afternoon plans of running errands, spending time with her dog, and reviewing her paperwork from group. Patient demonstrates some progress as evidenced by increase in insight. Patient denies SI/HI/self-harm at the end of group.   Suicidal/Homicidal: Nowithout intent/plan  Plan: Patient will continue in PHP while working on symptoms related to depression and anxiety.  Diagnosis: Major depressive disorder, recurrent severe without psychotic features (HCC) [F33.2]    1. Major depressive disorder, recurrent severe without psychotic features (HCC)   2. Anxiety   3. Benzodiazepine dependence, continuous (HCC)     [name], [facility] 11/29/2020

## 2020-11-30 ENCOUNTER — Other Ambulatory Visit: Payer: Self-pay

## 2020-11-30 ENCOUNTER — Ambulatory Visit (INDEPENDENT_AMBULATORY_CARE_PROVIDER_SITE_OTHER): Payer: No Payment, Other | Admitting: Professional

## 2020-11-30 DIAGNOSIS — F332 Major depressive disorder, recurrent severe without psychotic features: Secondary | ICD-10-CM

## 2020-11-30 DIAGNOSIS — F419 Anxiety disorder, unspecified: Secondary | ICD-10-CM

## 2020-11-30 DIAGNOSIS — F1320 Sedative, hypnotic or anxiolytic dependence, uncomplicated: Secondary | ICD-10-CM

## 2020-12-01 ENCOUNTER — Other Ambulatory Visit: Payer: Self-pay

## 2020-12-01 ENCOUNTER — Ambulatory Visit (INDEPENDENT_AMBULATORY_CARE_PROVIDER_SITE_OTHER): Payer: No Payment, Other | Admitting: Professional

## 2020-12-01 DIAGNOSIS — F332 Major depressive disorder, recurrent severe without psychotic features: Secondary | ICD-10-CM

## 2020-12-01 NOTE — Progress Notes (Signed)
Spoke with patient via Webex video call, used 2 identifiers to correctly identify patient. States that groups are going well and she is enjoying the one on one sessions. No side effects from medication. On scale 1-10 as 10 being worst she rates depression at 5 and anxiety at 6. Has anxiety about returning to work soon. Denies SI/HI or AV hallucinations. No issues or complaints.

## 2020-12-01 NOTE — Progress Notes (Signed)
Virtual Visit via Video Note  I connected with [name] on 12/01/20 at  9:00 AM EDT by a video enabled telemedicine application and verified that I am speaking with the correct person using two identifiers.  Location: Patient: Home Provider: Office   I discussed the limitations of evaluation and management by telemedicine and the availability of in person appointments. The patient expressed understanding and agreed to proceed.   I discussed the assessment and treatment plan with the patient. The patient was provided an opportunity to ask questions and all were answered. The patient agreed with the plan and demonstrated an understanding of the instructions.   The patient was advised to call back or seek an in-person evaluation if the symptoms worsen or if the condition fails to improve as anticipated.  I provided 15 minutes of non-face-to-face time during this encounter.   [name], NP   [facility] MD/PA/NP OP Progress Note  12/01/2020 1:50 PM [name]  MRN:  [id number]  Chief Complaint:  HPI:  Visit Diagnosis: No diagnosis found.  Past Psychiatric History:   Past Medical History:  Past Medical History:  Diagnosis Date   Anxiety    Benzodiazepine abuse (HCC)    Bipolar 1 disorder (HCC)    Depression    Drug abuse (HCC)    ETOH abuse    Polysubstance abuse (HCC)    No past surgical history on file.  Family Psychiatric History:   Family History:  Family History  Problem Relation Age of Onset   Drug abuse Mother    Anxiety disorder Mother    Anxiety disorder Father    Drug abuse Maternal Aunt    Alcohol abuse Maternal Grandfather    Drug abuse Cousin     Social History:  Social History   Socioeconomic History   Marital status: Single    Spouse name: Not on file   Number of children: 0   Years of education: Not on file   Highest education level: 11th grade  Occupational History   Not on file  Tobacco Use   Smoking status: Every  Day    Packs/day: 2.00    Years: 17.00    Pack years: 34.00    Types: Cigarettes   Smokeless tobacco: Never  Vaping Use   Vaping Use: Never used  Substance and Sexual Activity   Alcohol use: Yes    Comment: +63   Drug use: Yes    Types: Marijuana, IV    Comment: UDS + THC   Sexual activity: Never  Other Topics Concern   Not on file  Social History Narrative   Not on file   Social Determinants of Health   Financial Resource Strain: Not on file  Food Insecurity: Not on file  Transportation Needs: Not on file  Physical Activity: Not on file  Stress: Not on file  Social Connections: Not on file    Allergies:  Allergies  Allergen Reactions   Haldol [Haloperidol Lactate] Other (See Comments)    Makes whole body stiff    Metabolic Disorder Labs: Lab Results  Component Value Date   HGBA1C 5.2 07/12/2020   MPG 102.54 07/12/2020   No results found for: PROLACTIN Lab Results  Component Value Date   CHOL 123 07/12/2020   TRIG 56 07/12/2020   HDL 39 (L) 07/12/2020   CHOLHDL 3.2 07/12/2020   VLDL 11 07/12/2020   LDLCALC 73 07/12/2020   Lab Results  Component Value Date   TSH 0.906 07/12/2020  TSH 1.29 02/24/2016    Therapeutic Level Labs: No results found for: LITHIUM Lab Results  Component Value Date   VALPROATE 74.4 04/26/2010   VALPROATE 75.3 01/21/2010   No components found for:  CBMZ  Current Medications: Current Outpatient Medications  Medication Sig Dispense Refill   albuterol (VENTOLIN HFA) 108 (90 Base) MCG/ACT inhaler Inhale 2 puffs into the lungs every 6 (six) hours as needed for wheezing or shortness of breath. 8.5 g 1   gabapentin (NEURONTIN) 400 MG capsule Take 1 capsule (400 mg total) by mouth 3 (three) times daily. For agitation 90 capsule 0   hydrOXYzine (ATARAX/VISTARIL) 25 MG tablet Take 1 tablet (25 mg total) by mouth 3 (three) times daily as needed for anxiety. 75 tablet 2   omeprazole (PRILOSEC OTC) 20 MG tablet Take 20 mg by mouth  daily.     prazosin (MINIPRESS) 2 MG capsule Take 1 capsule (2 mg total) by mouth at bedtime for nightmares 30 capsule 0   sertraline (ZOLOFT) 50 MG tablet Take 1 tablet (50 mg total) by mouth at bedtime for depression 30 tablet 0   traZODone (DESYREL) 50 MG tablet Take 1 tablet (50 mg total) by mouth at bedtime as needed for sleep. 30 tablet 0   fluticasone (FLONASE) 50 MCG/ACT nasal spray Place 1 spray into both nostrils daily. For allergies (Patient not taking: No sig reported)  2   No current facility-administered medications for this visit.     Musculoskeletal: Strength & Muscle Tone: within normal limits Gait & Station: normal Patient leans: N/A  Psychiatric Specialty Exam: Review of Systems  There were no vitals taken for this visit.There is no height or weight on file to calculate BMI.  General Appearance: Casual  Eye Contact:  Good  Speech:  Clear and Coherent  Volume:  Normal  Mood:  Anxious and Depressed  Affect:  Congruent  Thought Process:  Coherent  Orientation:  Full (Time, Place, and Person)  Thought Content: Logical   Suicidal Thoughts:  No  Homicidal Thoughts:  No  Memory:  Immediate;   Fair Recent;   Fair  Judgement:  Good  Insight:  Good  Psychomotor Activity:  Normal  Concentration:  Concentration: Good  Recall:  Good  Fund of Knowledge: Good  Language: Good  Akathisia:  No  Handed:  Right  AIMS (if indicated): done  Assets:  Communication Skills Desire for Improvement Resilience Social Support  ADL's:  Intact  Cognition: WNL  Sleep:  Good   Screenings: AIMS    Flowsheet Row [facility]) [facility]  AIMS Total Score 0 0      AUDIT    Flowsheet Row [facility]) [facility] ([facility] ([facility]) [facility]  Alcohol Use Disorder Identification Test Final Score (AUDIT) 2 2 22 29  33      GAD-7    Flowsheet Row Counselor from 11/17/2020 in [facility] Office Visit from 10/08/2020 in [facility] Office Visit from 06/05/2016 in [facility] Office Visit from 04/05/2016 in [facility] Office Visit  from 02/24/2016 in [facility]  Total GAD-7 Score 20 19 11 9 10       PHQ2-9    Flowsheet Row Counselor from 11/25/2020 in [facility] Counselor from 11/17/2020 in [facility] Office Visit from 10/08/2020 in [facility] Video Visit from 07/16/2020 in [facility] Office Visit from 06/05/2016 in [facility]  PHQ-2 Total Score 6 6 6  0 2  PHQ-9 Total Score 25 26 27  -- 9      Flowsheet Row Counselor from 11/25/2020 in [facility] Ad[facility] (Discharged[facility] from 11/19/2020 [facility]  C-SSRS RISK CATEGORY Error: Question 6 not populated Low Risk Moderate Risk        Assessment and Plan:  Continue Partial Hospitalization    11/22/2020, NP 12/01/2020, 1:50 PM

## 2020-12-02 ENCOUNTER — Encounter (HOSPITAL_COMMUNITY): Payer: Self-pay | Admitting: Professional

## 2020-12-02 ENCOUNTER — Ambulatory Visit (HOSPITAL_COMMUNITY): Payer: No Payment, Other

## 2020-12-02 NOTE — Psych (Signed)
Virtual Visit via Video Note  I connected with [name] on 12/01/20 at  9:00 AM EDT by a video enabled telemedicine application and verified that I am speaking with the correct person using two identifiers.  Location: Patient: Home Provider: Clinical Home Office   I discussed the limitations of evaluation and management by telemedicine and the availability of in person appointments. The patient expressed understanding and agreed to proceed.  Follow Up Instructions:    I discussed the assessment and treatment plan with the patient. The patient was provided an opportunity to ask questions and all were answered. The patient agreed with the plan and demonstrated an understanding of the instructions.   The patient was advised to call back or seek an in-person evaluation if the symptoms worsen or if the condition fails to improve as anticipated.  I provided 240 minutes of non-face-to-face time during this encounter.   [name], LCMHCA    Briane Birden [phone]  Session Time: 9-10  Participation Level: Active  Behavioral Response: CasualAlertAnxious  Type of Therapy: Group Therapy  Treatment Goals addressed: Coping  Interventions: CBT, DBT, Solution Focused, Strength-based, Supportive, and Reframing  Summary: Clinician led check-in regarding current stressors and situation, and patient completed verbal daily inventory. Clinician utilized active listening and empathetic response and validated patient emotions. Clinician facilitated processing group on pertinent issues.   Therapist Response: [name] is a 40 y.o. female who presents with with depression and anxiety symptoms. Patient arrived within time allowed and reports that she is feeling "not the greatest but could be worse." Patient rates her mood at a 5 on a scale of 1-10 with 10 being great. Pt reports she took her dog for a walk and napped yesterday afternoon. Pt reports she struggles  to find the good in herself but can easily find it in others. Pt able to process. Pt engaged in discussion.    Session Time: 10-11  Participation Level: Active  Behavioral Response: CasualAlertAnxious  Type of Therapy: Group Therapy  Treatment Goals addressed: Coping  Interventions: CBT, DBT, Solution Focused, Strength-based, Supportive, and Reframing  Summary: Clinician introduced topic of "Cognitive Distortions".  Pts identified common cognitive distortions they often experience and ways to combat those distortions.  Therapist Response: Pt engaged in discussion. Pt reports struggling with minimization and magnification.   Session Time: 11-12  Participation Level: Active  Behavioral Response: CasualAlertAnxious  Type of Therapy: Group Therapy   Treatment Goals addressed: Coping   Interventions: CBT, DBT, Solution Focused, Strength-based, Supportive, and Reframing  Summary: Clinician continued topic of "Cognitive Distortions".  Pts continued to identify common cognitive distortions they often experience and ways to combat those distortions.  Therapist Response: Pt engaged in discussion. Pt reports having a better understanding of how to combat cognitive distortions using "check-the-facts," "best friend test," and reminding self "feelings do not equal fact."    Session Time: 12-1  Participation Level: Active  Behavioral Response: CasualAlertAnxious and Irritable  Type of Therapy: Group Therapy  Treatment Goals addressed: Coping  Interventions: CBT, DBT, Solution Focused, Strength-based, Supportive, and Reframing  Summary: 12:00 - 12:50: Clinician introduced topic of "but/and." Cln explained how opposites can be accurate at the same time such as "I love you but you annoy me." Vs "I love you and you annoy me." 12:50 -1:00 Clinician led check-out. Clinician assessed for immediate needs, medication compliance and efficacy, and safety concerns   Therapist Response: 12:00  - 12:50: Pt engaged in discussion. Pt engaged in practicing  real world "but" statements and changing to "and" statements. 12:50 - 1:00: At check-out, patient rates her mood at a 5 on a scale of 1-10 with 10 being great. Pt reports afternoon plans of walking her dog. Patient demonstrates some progress as evidenced by increased insight into responsibility for own actions and feelings. Patient denies SI/HI/self-harm at the end of group.   Suicidal/Homicidal: Nowithout intent/plan  Plan: Patient will continue in PHP while working on symptoms related to depression and anxiety.  Diagnosis: Major depressive disorder, recurrent severe without psychotic features (HCC) [F33.2]    1. Major depressive disorder, recurrent severe without psychotic features (HCC)   2. Anxiety   3. Benzodiazepine dependence, continuous (HCC)        [name], [facility] 12/01/2020

## 2020-12-02 NOTE — Progress Notes (Signed)
Virtual Visit via Video Note  I connected with [name] on 12/02/20 at  9:00 AM EDT by a video enabled telemedicine application and verified that I am speaking with the correct person using two identifiers.  Location: Patient: Home Provider: Office   I discussed the limitations of evaluation and management by telemedicine and the availability of in person appointments. The patient expressed understanding and agreed to proceed.   I discussed the assessment and treatment plan with the patient. The patient was provided an opportunity to ask questions and all were answered. The patient agreed with the plan and demonstrated an understanding of the instructions.   The patient was advised to call back or seek an in-person evaluation if the symptoms worsen or if the condition fails to improve as anticipated.  I provided 15 minutes of non-face-to-face time during this encounter.   [name], NP   [facility] MD/PA/NP OP Progress Note  12/02/2020 10:29 AM [name]  MRN:  [id number]   Evaluation: [name] was seen and evaluated via WebEx.  She continues to present flat, but pleasant.  Reporting chronic suicidal ideations she denied plan or intent.  Reports she has been working on the relationship between she and her girlfriend.  States "we have a lot of things to work through" denies auditory or visual hallucinations.  Reports she has been taking medications as directed.  Reports learning coping skills with radical acceptance and self control.  Rates her depression 5 out of 10 with 10 being the worst.  Reports a good appetite.  States she is rested well throughout the night.  Anticipated discharge early next week.  Support encouragement reassurance was provided.  Visit Diagnosis:    ICD-10-CM   1. Major depressive disorder, recurrent severe without psychotic features (HCC)  F33.2       Past Psychiatric History:   Past Medical History:  Past Medical History:   Diagnosis Date   Anxiety    Benzodiazepine abuse (HCC)    Bipolar 1 disorder (HCC)    Depression    Drug abuse (HCC)    ETOH abuse    Polysubstance abuse (HCC)    No past surgical history on file.  Family Psychiatric History:   Family History:  Family History  Problem Relation Age of Onset   Drug abuse Mother    Anxiety disorder Mother    Anxiety disorder Father    Drug abuse Maternal Aunt    Alcohol abuse Maternal Grandfather    Drug abuse Cousin     Social History:  Social History   Socioeconomic History   Marital status: Single    Spouse name: Not on file   Number of children: 0   Years of education: Not on file   Highest education level: 11th grade  Occupational History   Not on file  Tobacco Use   Smoking status: Every Day    Packs/day: 2.00    Years: 17.00    Pack years: 34.00    Types: Cigarettes   Smokeless tobacco: Never  Vaping Use   Vaping Use: Never used  Substance and Sexual Activity   Alcohol use: Yes    Comment: +63   Drug use: Yes    Types: Marijuana, IV    Comment: UDS + THC   Sexual activity: Never  Other Topics Concern   Not on file  Social History Narrative   Not on file   Social Determinants of Health   Financial Resource Strain: Not on  file  Food Insecurity: Not on file  Transportation Needs: Not on file  Physical Activity: Not on file  Stress: Not on file  Social Connections: Not on file    Allergies:  Allergies  Allergen Reactions   Haldol [Haloperidol Lactate] Other (See Comments)    Makes whole body stiff    Metabolic Disorder Labs: Lab Results  Component Value Date   HGBA1C 5.2 07/12/2020   MPG 102.54 07/12/2020   No results found for: PROLACTIN Lab Results  Component Value Date   CHOL 123 07/12/2020   TRIG 56 07/12/2020   HDL 39 (L) 07/12/2020   CHOLHDL 3.2 07/12/2020   VLDL 11 07/12/2020   LDLCALC 73 07/12/2020   Lab Results  Component Value Date   TSH 0.906 07/12/2020   TSH 1.29 02/24/2016     Therapeutic Level Labs: No results found for: LITHIUM Lab Results  Component Value Date   VALPROATE 74.4 04/26/2010   VALPROATE 75.3 01/21/2010   No components found for:  CBMZ  Current Medications: Current Outpatient Medications  Medication Sig Dispense Refill   albuterol (VENTOLIN HFA) 108 (90 Base) MCG/ACT inhaler Inhale 2 puffs into the lungs every 6 (six) hours as needed for wheezing or shortness of breath. 8.5 g 1   fluticasone (FLONASE) 50 MCG/ACT nasal spray Place 1 spray into both nostrils daily. For allergies (Patient not taking: No sig reported)  2   gabapentin (NEURONTIN) 400 MG capsule Take 1 capsule (400 mg total) by mouth 3 (three) times daily. For agitation 90 capsule 0   hydrOXYzine (ATARAX/VISTARIL) 25 MG tablet Take 1 tablet (25 mg total) by mouth 3 (three) times daily as needed for anxiety. 75 tablet 2   omeprazole (PRILOSEC OTC) 20 MG tablet Take 20 mg by mouth daily.     prazosin (MINIPRESS) 2 MG capsule Take 1 capsule (2 mg total) by mouth at bedtime for nightmares 30 capsule 0   sertraline (ZOLOFT) 50 MG tablet Take 1 tablet (50 mg total) by mouth at bedtime for depression 30 tablet 0   traZODone (DESYREL) 50 MG tablet Take 1 tablet (50 mg total) by mouth at bedtime as needed for sleep. 30 tablet 0   No current facility-administered medications for this visit.     Musculoskeletal: Strength & Muscle Tone: within normal limits Gait & Station: normal Patient leans: N/A  Psychiatric Specialty Exam: Review of Systems  There were no vitals taken for this visit.There is no height or weight on file to calculate BMI.  General Appearance: Casual  Eye Contact:  Good  Speech:  Clear and Coherent  Volume:  Normal  Mood:  Anxious and Depressed  Affect:  Congruent  Thought Process:  Coherent  Orientation:  Full (Time, Place, and Person)  Thought Content: Logical   Suicidal Thoughts:  Yes.  without intent/plan  Homicidal Thoughts:  No  Memory:  Immediate;    Fair Recent;   Fair Remote;   Fair  Judgement:  Fair  Insight:  Fair  Psychomotor Activity:  Normal  Concentration:  Concentration: Good  Recall:  Good  Fund of Knowledge: Good  Language: Good  Akathisia:  No  Handed:  Right  AIMS (if indicated): done  Assets:  Communication Skills Social Support  ADL's:  Intact  Cognition: WNL  Sleep:  Good   Screenings: AIMS    Flowsheet Row [facility]) [facility]  AIMS Total  Score 0 0      AUDIT    Flowsheet Row [facility]) [facility] Ad[facility] (Discharged[facility] (Discharged[facility] (Discharged) [facility] ([facility]  Alcohol Use Disorder Identification Test Final Score (AUDIT) 2 2 22 29  33      GAD-7    Flowsheet Row Counselor from 11/17/2020 in [facility] Office Visit from 10/08/2020 in [facility] Office Visit from 06/05/2016 in [facility] Office Visit from 04/05/2016 in [facility] Office Visit from 02/24/2016 in [facility]  Total GAD-7 Score 20 19 11 9 10       PHQ2-9    Flowsheet Row Counselor from 11/25/2020 in [facility] Counselor from 11/17/2020 in [facility] Office Visit from 10/08/2020 in [facility] Video Visit from 07/16/2020 in [facility] Office Visit from 06/05/2016 in [facility]  PHQ-2 Total Score 6 6 6  0 2  PHQ-9 Total Score 25 26 27  -- 9      Flowsheet Row Counselor from 11/25/2020 in [facility] Admission (Discharged[facility]  C-SSRS RISK CATEGORY Error: Question 6 not populated Low Risk Moderate Risk        Assessment and Plan:  Continue partial hospitalization programming-[facility] Continue medications as indicated  Treatment plan was reviewed and agreed upon by NP [facility] and patient [name] need for continued group services mood   01/19/2021, NP 12/02/2020, 10:29 AM

## 2020-12-02 NOTE — Psych (Signed)
[facility] THERAPIST PROGRESS NOTE  Virtual Visit via Video Note  I connected with [name] on 11/30/20 at  9:00 AM EDT by a video enabled telemedicine application and verified that I am speaking with the correct person using two identifiers.  Location: Patient: Home Provider: Clinical Home Office   I discussed the limitations of evaluation and management by telemedicine and the availability of in person appointments. The patient expressed understanding and agreed to proceed.  Follow Up Instructions:    I discussed the assessment and treatment plan with the patient. The patient was provided an opportunity to ask questions and all were answered. The patient agreed with the plan and demonstrated an understanding of the instructions.   The patient was advised to call back or seek an in-person evaluation if the symptoms worsen or if the condition fails to improve as anticipated.  I provided 240 minutes of non-face-to-face time during this encounter.   [name], LCMHCA      Camia Dipinto [phone]  Session Time: 9-10  Participation Level: Active  Behavioral Response: CasualAlertAnxious  Type of Therapy: Group Therapy  Treatment Goals addressed: Coping  Interventions: CBT, DBT, Solution Focused, Strength-based, Supportive, and Reframing  Summary: Clinician led check-in regarding current stressors and situation, and patient completed verbal daily inventory. Clinician utilized active listening and empathetic response and validated patient emotions. Clinician facilitated processing group on pertinent issues.   Therapist Response: [name] is a 40 y.o. female who presents with with depression and anxiety symptoms. Patient arrived within time allowed and reports that she is feeling "just kinda here." Patient rates her mood at a 5 on a scale of 1-10 with 10 being great. Pt reports she ran a few errands yesterday afternoon and took a nap.  Pt reports she is stepping back from having conversations with her ex. Pt reports she has increased awareness of her emotions and the actions that follow. Pt able to process. Pt engaged in discussion.    Session Time: 10-11  Participation Level: Active  Behavioral Response: CasualAlertAnxious  Type of Therapy: Group Therapy  Treatment Goals addressed: Coping  Interventions: CBT, DBT, Solution Focused, Strength-based, Supportive, and Reframing  Summary: Clinician introduced topic of "fair fighting rules" for arguments.  Patients identified which rules they already follow, and which rules they need to be more aware of for better communication in the future.  Therapist Response: Pt engaged in discussion. Pt reports needing to practice staying focused on one topic at a time.    Session Time: 11-12  Participation Level: Active  Behavioral Response: CasualAlertAnxious  Type of Therapy: Group Therapy   Treatment Goals addressed: Coping   Interventions: Supportive and Reframing   Summary:  Spiritual care group.   Therapist Response: Pt engaged in discussion. See Chaplin note.   Session Time: 12-1  Participation Level: Active  Behavioral Response: CasualAlertAnxious and Irritable  Type of Therapy: Group Therapy  Treatment Goals addressed: Coping  Interventions: CBT, DBT, Solution Focused, Strength-based, Supportive, and Reframing  Summary: 12:00 - 12:50: Clinician continued topic of communication.  Clinician introduced "I Statements" and patients practiced responding to situations using I Statements. 12:50 -1:00 Clinician led check-out. Clinician assessed for immediate needs, medication compliance and efficacy, and safety concerns   Therapist Response: 12:00 - 12:50: Pt engaged in discussion. Pt reports she needs improvements in self-esteem, self-respect, and independence. 12:50 - 1:00: At check-out, patient rates her mood at a 6 on a scale of 1-10 with 10 being  great. Pt  reports afternoon plans of listening to sermons, walking her dog, and watching TV. Patient demonstrates some progress as evidenced by increased awareness of emotions and not allowing emotions to control her. Patient denies SI/HI/self-harm at the end of group.   Suicidal/Homicidal: Nowithout intent/plan  Plan: Patient will continue in PHP while working on symptoms related to depression and anxiety.  Diagnosis: Major depressive disorder, recurrent severe without psychotic features (HCC) [F33.2]    1. Major depressive disorder, recurrent severe without psychotic features (HCC)   2. Anxiety   3. Benzodiazepine dependence, continuous (HCC)       [name], [facility] 11/30/20

## 2020-12-03 ENCOUNTER — Ambulatory Visit (INDEPENDENT_AMBULATORY_CARE_PROVIDER_SITE_OTHER): Payer: No Payment, Other | Admitting: Professional

## 2020-12-03 DIAGNOSIS — F332 Major depressive disorder, recurrent severe without psychotic features: Secondary | ICD-10-CM | POA: Diagnosis not present

## 2020-12-03 DIAGNOSIS — F1320 Sedative, hypnotic or anxiolytic dependence, uncomplicated: Secondary | ICD-10-CM

## 2020-12-03 DIAGNOSIS — F419 Anxiety disorder, unspecified: Secondary | ICD-10-CM

## 2020-12-06 ENCOUNTER — Ambulatory Visit (INDEPENDENT_AMBULATORY_CARE_PROVIDER_SITE_OTHER): Payer: No Payment, Other | Admitting: Professional

## 2020-12-06 ENCOUNTER — Other Ambulatory Visit: Payer: Self-pay

## 2020-12-06 DIAGNOSIS — F332 Major depressive disorder, recurrent severe without psychotic features: Secondary | ICD-10-CM

## 2020-12-06 DIAGNOSIS — F419 Anxiety disorder, unspecified: Secondary | ICD-10-CM

## 2020-12-06 DIAGNOSIS — F1320 Sedative, hypnotic or anxiolytic dependence, uncomplicated: Secondary | ICD-10-CM

## 2020-12-07 ENCOUNTER — Ambulatory Visit (INDEPENDENT_AMBULATORY_CARE_PROVIDER_SITE_OTHER): Payer: No Payment, Other | Admitting: Professional

## 2020-12-07 ENCOUNTER — Other Ambulatory Visit: Payer: Self-pay

## 2020-12-07 DIAGNOSIS — F332 Major depressive disorder, recurrent severe without psychotic features: Secondary | ICD-10-CM | POA: Diagnosis not present

## 2020-12-07 MED ORDER — TRAZODONE HCL 100 MG PO TABS
50.0000 mg | ORAL_TABLET | Freq: Every evening | ORAL | 0 refills | Status: DC | PRN
  Filled 2020-12-07: qty 15, 30d supply, fill #0

## 2020-12-07 NOTE — Progress Notes (Signed)
Virtual Visit via Video Note  I connected with [name] on 12/08/20 at  9:00 AM EDT by a video enabled telemedicine application and verified that I am speaking with the correct person using two identifiers.  Location: Patient: Home Provider: Office   I discussed the limitations of evaluation and management by telemedicine and the availability of in person appointments. The patient expressed understanding and agreed to proceed.   I discussed the assessment and treatment plan with the patient. The patient was provided an opportunity to ask questions and all were answered. The patient agreed with the plan and demonstrated an understanding of the instructions.   The patient was advised to call back or seek an in-person evaluation if the symptoms worsen or if the condition fails to improve as anticipated.  I provided 15 minutes of non-face-to-face time during this encounter.   Denise Rack, NP   [facility]- [facility] urgent care Discharge Summary  [name] [id number]  Admission date: 08/16/202 Discharge date: 12/07/2020  Reason for admission: Per admission assessment note: "[name] is a 40 year-old female that was referred by her outpatient provider. Patient was recently discharged from inpatient admission due to worsening depression and chronic suicidal ideations.  Patient continues to request Xanax refills.  She reports a recent break-up with her girlfriend.  States she feels lonely and helpless.  States she continues to work to occupy her time.  States that she believes her father initiated involuntary commitment.  States that she was drinking and using Xanax off the streets which caused her mood and her thoughts to become altered.  Chemical Use History: Continues to report using Xanax off the street to help with her anxiety.  Patient to consider following up with chemical dependency program.      Progress in Program Toward Treatment Goals: Ongoing, patient attended and participated with daily group session with active and engaged participation.  Reporting chronic suicidal ideations denies plan or intent.  States she has not been resting well throughout the night reports taking Xanax, Benadryl and Trazodone to help her sleep which has not been effective.  Discussed titrating trazodone from 50 mg to 100 mg.  Patient was receptive to plan.  Reports mild anxiety related to going back to work.  Discussed sleep disturbance may be attributed to situational stressors/anxiety.  Patient was receptive to plan.  Progress (rationale): Keep follow-up appointment with outpatient provider, increase trazodone 50 mg to 100 mg at discharge.    Take all medications as prescribed. Keep all follow-up appointments as scheduled.  Do not consume alcohol or use illegal drugs while on prescription medications. Report any adverse effects from your medications to your primary care provider promptly.  In the event of recurrent symptoms or worsening symptoms, call 911, a crisis hotline, or go to the nearest emergency department for evaluation.    Denise Jacks NP 12/08/2020

## 2020-12-07 NOTE — Psych (Signed)
Virtual Visit via Video Note  I connected with [name] on 12/07/20 at  9:00 AM EDT by a video enabled telemedicine application and verified that I am speaking with the correct person using two identifiers.  Location: Patient: Home Provider: Clinical Home Office   I discussed the limitations of evaluation and management by telemedicine and the availability of in person appointments. The patient expressed understanding and agreed to proceed.  Follow Up Instructions:    I discussed the assessment and treatment plan with the patient. The patient was provided an opportunity to ask questions and all were answered. The patient agreed with the plan and demonstrated an understanding of the instructions.   The patient was advised to call back or seek an in-person evaluation if the symptoms worsen or if the condition fails to improve as anticipated.  I provided 240 minutes of non-face-to-face time during this encounter.   [name], LCMHCA    [name] [id number]  Session Time: 9-10  Participation Level: Active  Behavioral Response: CasualAlertAnxious  Type of Therapy: Group Therapy  Treatment Goals addressed: Coping  Interventions: CBT, DBT, Solution Focused, Strength-based, Supportive, and Reframing  Summary: Clinician led check-in regarding current stressors and situation, and patient completed verbal daily inventory. Clinician utilized active listening and empathetic response and validated patient emotions. Clinician facilitated processing group on pertinent issues.   Therapist Response: [name] is a 40 y.o. female who presents with with depression and anxiety symptoms. Patient arrived within time allowed and reports that she is feeling "overwhelmed" Patient rates her mood at a 4 on a scale of 1-10 with 10 being great. 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 Pt reports using deep breathing to  help. Pt able to process. Pt engaged in discussion.    Session Time: 10-11  Participation Level: Active  Behavioral Response: CasualAlertAnxious  Type of Therapy: Group Therapy  Treatment Goals addressed: Coping  Interventions: CBT, DBT, Solution Focused, Strength-based, Supportive, and Reframing  Summary: Clinician led introduced the topic of "Healthy Relationships". Group discussed healthy/unhealthy traits for relationships.  Therapist Response: Pt engaged in discussion. Pt able to identify healthy traits for her ideal relationships.   Session Time: 11-12   Participation Level: Active   Behavioral Response: CasualAlertAnxious   Type of Therapy: Group Therapy   Treatment Goals addressed: Coping   Interventions: Supportive and Reframing   Summary:  Spiritual care group.   Therapist Response: Pt engaged in discussion. See Chaplin note.    Session Time: 12-1  Participation Level: Active  Behavioral Response: CasualAlertAnxious  Type of Therapy: Group Therapy  Treatment Goals addressed: Coping  Interventions: CBT, DBT, Solution Focused, Strength-based, Supportive, and Reframing  Summary: 12:00 - 12:50: Clinician introduced topic of "Self-Esteem". Group discussed strengths and qualities.  Patient identified personal strengths and qualities and shared with group. Group discussed writing positive affirmations. 12:50 -1:00 Clinician led check-out. Clinician assessed for immediate needs, medication compliance and efficacy, and safety concerns   Therapist Response: 12:00 - 12:50: Pt engaged in discussion. Pt able to identify positive affirmations to write and post around her room. 12:50 - 1:00: At check-out, patient rates her mood at a 6 on a scale of 1-10 with 10 being great. Pt reports afternoon plans of walking her dog and running errands. Pt again reports anxiety about being done with group. Cln and pt spend time discussing her progress. Patient demonstrates some  progress as evidenced by increased ability to identify  own progress. Patient denies SI/HI/self-harm at the end of group.   Suicidal/Homicidal: Nowithout intent/plan  Plan: Patient will discharge from PHP due to meeting goals of gaining coping skills, decreased depression and anxiety symptoms. Progress was measured by observation, self-report, and scales. Provider has approved discharge and patient reports alignment with discharge plan. Patient will step down to individual counseling and medication management. Pt is scheduled for individual counseling on 01/07/21 at 10a with [name] and medication management on 01/14/21 at 11a with [name].  Patient denies any SI/HI at time of discharge.  Diagnosis: Major depressive disorder, recurrent severe without psychotic features (HCC) [F33.2]    1. Major depressive disorder, recurrent severe without psychotic features (HCC)   2. Recurrent major depressive disorder, in partial remission (HCC)   3. Anxiety   4. Benzodiazepine dependence, continuous (HCC)        [name], [facility] 12/07/2020

## 2020-12-07 NOTE — Psych (Signed)
Virtual Visit via Video Note  I connected with [name] on 12/03/20 at  9:00 AM EDT by a video enabled telemedicine application and verified that I am speaking with the correct person using two identifiers.  Location: Patient: Home Provider: Clinical Home Office   I discussed the limitations of evaluation and management by telemedicine and the availability of in person appointments. The patient expressed understanding and agreed to proceed.  Follow Up Instructions:    I discussed the assessment and treatment plan with the patient. The patient was provided an opportunity to ask questions and all were answered. The patient agreed with the plan and demonstrated an understanding of the instructions.   The patient was advised to call back or seek an in-person evaluation if the symptoms worsen or if the condition fails to improve as anticipated.  I provided 240 minutes of non-face-to-face time during this encounter.   [name], LCMHCA    Taquila Leys [id number]  Session Time: 9-10  Participation Level: Active  Behavioral Response: CasualAlertAnxious  Type of Therapy: Group Therapy  Treatment Goals addressed: Coping  Interventions: CBT, DBT, Solution Focused, Strength-based, Supportive, and Reframing  Summary: Clinician led check-in regarding current stressors and situation, and patient completed verbal daily inventory. Clinician utilized active listening and empathetic response and validated patient emotions. Clinician facilitated processing group on pertinent issues.   Therapist Response: [name] is a 40 y.o. female who presents with with depression and anxiety symptoms. Patient arrived within time allowed and reports that she is feeling "anxious." Patient rates her mood at a 5 on a scale of 1-10 with 10 being great. 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 Pt is stressed  due to the change and this store being father away from her house which makes transportation more of an issue. Pt reports her parents got into an argument and she used good boundaries to not get in the middle of the argument. Pt able to process. Pt engaged in discussion.    Session Time: 10-11  Participation Level: Active  Behavioral Response: CasualAlertAnxious  Type of Therapy: Group Therapy  Treatment Goals addressed: Coping  Interventions: CBT, DBT, Solution Focused, Strength-based, Supportive, and Reframing  Summary: Clinician led psychoeducation group on distress tolerance. The STOP skill was introduced, and patients discussed how to utilize it. Patients identified when this technique may be helpful in their personal lives.  Therapist Response: Pt engaged in discussion. Pt reports getting better at taking a step back and needing continued work.   Session Time: 11-12  Participation Level: Active  Behavioral Response: CasualAlertAnxious  Type of Therapy: Group Therapy   Treatment Goals addressed: Coping   Interventions: CBT, DBT, Solution Focused, Strength-based, Supportive, and Reframing  Summary: Clinician continued topic of "Distress Tolerance". Group discussed "TIPS" and how/when patients can employ this method to help.  Patients identified when this technique may be helpful in their personal lives.  Therapist Response: Pt engaged in discussion. Pt reports using Temperature change could be beneficial for her.    Session Time: 12-1  Participation Level: Active  Behavioral Response: CasualAlertAnxious and Irritable  Type of Therapy: Group Therapy  Treatment Goals addressed: Coping  Interventions: CBT, DBT, Solution Focused, Strength-based, Supportive, and Reframing  Summary: 12:00 - 12:50: Clinician led psychoeducation group on distress tolerance. The ACCEPTS skill was reviewed and finished, and patients discussed how to utilize it. Patients identified when this  technique may  be helpful in their personal lives. Pt participated in creating Top 5 lists. 12:50 -1:00 Clinician led check-out. Clinician assessed for immediate needs, medication compliance and efficacy, and safety concerns   Therapist Response: 12:00 - 12:50: Pt engaged in discussion. Pt reports top 5 lists may be beneficial for her to use in heightened emotional states. 12:50 - 1:00: At check-out, patient rates her mood at a 6 on a scale of 1-10 with 10 being great. Pt reports weekend plans of walking her dog, cleaning, listening to music, running errands, and possibly seeing her ex girlfriend. Patient demonstrates some progress as evidenced by increased insight into what skills can help and why. Patient denies SI/HI/self-harm at the end of group.   Suicidal/Homicidal: Nowithout intent/plan  Plan: Patient will continue in PHP while working on symptoms related to depression and anxiety.  Diagnosis: Major depressive disorder, recurrent severe without psychotic features (HCC) [F33.2]    1. Major depressive disorder, recurrent severe without psychotic features (HCC)   2. Anxiety   3. Benzodiazepine dependence, continuous (HCC)        [name], [facility] 12/03/2020

## 2020-12-07 NOTE — Psych (Signed)
Virtual Visit via Video Note  I connected with [name] on 12/06/20 at  9:00 AM EDT by a video enabled telemedicine application and verified that I am speaking with the correct person using two identifiers.  Location: Patient: Home Provider: Clinical Home Office   I discussed the limitations of evaluation and management by telemedicine and the availability of in person appointments. The patient expressed understanding and agreed to proceed.  Follow Up Instructions:    I discussed the assessment and treatment plan with the patient. The patient was provided an opportunity to ask questions and all were answered. The patient agreed with the plan and demonstrated an understanding of the instructions.   The patient was advised to call back or seek an in-person evaluation if the symptoms worsen or if the condition fails to improve as anticipated.  I provided 240 minutes of non-face-to-face time during this encounter.   [name], LCMHCA    Idabell Picking [phone]  Session Time: 9-10  Participation Level: Active  Behavioral Response: CasualAlertAnxious  Type of Therapy: Group Therapy  Treatment Goals addressed: Coping  Interventions: CBT, DBT, Solution Focused, Strength-based, Supportive, and Reframing  Summary: Clinician led check-in regarding current stressors and situation, and patient completed verbal daily inventory. Clinician utilized active listening and empathetic response and validated patient emotions. Clinician facilitated processing group on pertinent issues.   Therapist Response: Nasiah Polinsky is a 40 y.o. female who presents with with depression and anxiety symptoms. Patient arrived within time allowed and reports that she is feeling "ok, in the middle." Patient rates her mood at a 5 on a scale of 1-10 with 10 being great. Pt reports she had a good weekend, though she had decreased sleep. Pt reports a "not great conversation" with her  ex-girlfriend but pt was able to handle emotions related to converation in a healthier manner. Pt reports using deep breathing to help. Pt able to process. Pt engaged in discussion.    Session Time: 10-11  Participation Level: Active  Behavioral Response: CasualAlertAnxious  Type of Therapy: Group Therapy  Treatment Goals addressed: Coping  Interventions: CBT, DBT, Solution Focused, Strength-based, Supportive, and Reframing  Summary: Clinician led psychoeducation group on grounding skills and how to utilize them.   Therapist Response: Pt engaged in discussion. Pt reports categories game, 5-4-3-2-1, and bilateral tapping could all be helpful.    Session Time: 11-12  Participation Level: Active  Behavioral Response: CasualAlertAnxious  Type of Therapy: Group Therapy   Treatment Goals addressed: Coping   Interventions: CBT, DBT, Solution Focused, Strength-based, Supportive, and Reframing  Summary: Clinician introduced topic of "Boundaries". Patients discussed the difference between rigid, porous, and healthy boundaries.  Patients identified different areas in life where boundaries need to be addressed.   Therapist Response: Pt engaged in discussion. Pt reports using needing to address boundaries in her home life and work life.   Session Time: 12-1  Participation Level: Active  Behavioral Response: CasualAlertAnxious and Irritable  Type of Therapy: Group Therapy  Treatment Goals addressed: Coping  Interventions: CBT, DBT, Solution Focused, Strength-based, Supportive, and Reframing  Summary: 12:00 - 12:50: Clinician continued with topic of "Boundaries". Clinician introduced topic of how to set and maintain boundaries.  Group identified areas in life where boundaries need to be examined and changed, and how to set realistic boundaries.  12:50 -1:00 Clinician led check-out. Clinician assessed for immediate needs, medication compliance and efficacy, and safety concerns    Therapist Response: 12:00 - 12:50: Pt engaged in  discussion. Pt reports wanting to set boundaries with parents so she will not be "the middle man." 12:50 - 1:00: At check-out, patient rates her mood at a 6 on a scale of 1-10 with 10 being great. Pt reports afternoon plans of walking her dog, reviewing papers from group, and talking to her ex-girlfriend. Patient demonstrates some progress as evidenced by increased insight into boundaries needed to be implimented in romantic relationships. Patient denies SI/HI/self-harm at the end of group.   Suicidal/Homicidal: Nowithout intent/plan  Plan: Patient will continue in PHP while working on symptoms related to depression and anxiety.  Diagnosis: Major depressive disorder, recurrent severe without psychotic features (HCC) [F33.2]    1. Major depressive disorder, recurrent severe without psychotic features (HCC)   2. Anxiety   3. Benzodiazepine dependence, continuous (HCC)        [name], [facility] 12/06/2020

## 2020-12-08 ENCOUNTER — Encounter (HOSPITAL_COMMUNITY): Payer: Self-pay | Admitting: Professional

## 2020-12-14 ENCOUNTER — Other Ambulatory Visit: Payer: Self-pay

## 2020-12-17 ENCOUNTER — Ambulatory Visit (HOSPITAL_COMMUNITY): Payer: No Payment, Other | Admitting: Licensed Clinical Social Worker

## 2021-01-07 ENCOUNTER — Telehealth (HOSPITAL_COMMUNITY): Payer: Self-pay | Admitting: Licensed Clinical Social Worker

## 2021-01-07 ENCOUNTER — Ambulatory Visit (HOSPITAL_COMMUNITY): Payer: No Payment, Other | Admitting: Licensed Clinical Social Worker

## 2021-01-07 ENCOUNTER — Other Ambulatory Visit: Payer: Self-pay

## 2021-01-07 NOTE — Telephone Encounter (Signed)
LCSW sent two links tp p phone with no response. LCSW f/u with PC, pt did answer and stated she was at work and would need to call back to reschedule once she has her work schedule for the month.

## 2021-01-14 ENCOUNTER — Telehealth (HOSPITAL_COMMUNITY): Payer: No Payment, Other | Admitting: Physician Assistant

## 2021-01-14 ENCOUNTER — Ambulatory Visit: Payer: Self-pay | Admitting: Internal Medicine

## 2021-01-14 ENCOUNTER — Other Ambulatory Visit: Payer: Self-pay

## 2021-01-24 ENCOUNTER — Encounter (HOSPITAL_COMMUNITY): Payer: Self-pay | Admitting: Physician Assistant

## 2021-01-24 ENCOUNTER — Telehealth (INDEPENDENT_AMBULATORY_CARE_PROVIDER_SITE_OTHER): Payer: No Payment, Other | Admitting: Physician Assistant

## 2021-01-24 ENCOUNTER — Other Ambulatory Visit: Payer: Self-pay

## 2021-01-24 DIAGNOSIS — F419 Anxiety disorder, unspecified: Secondary | ICD-10-CM | POA: Diagnosis not present

## 2021-01-24 DIAGNOSIS — G479 Sleep disorder, unspecified: Secondary | ICD-10-CM

## 2021-01-24 DIAGNOSIS — F4310 Post-traumatic stress disorder, unspecified: Secondary | ICD-10-CM | POA: Diagnosis not present

## 2021-01-24 DIAGNOSIS — F3341 Major depressive disorder, recurrent, in partial remission: Secondary | ICD-10-CM | POA: Diagnosis not present

## 2021-01-24 DIAGNOSIS — F1321 Sedative, hypnotic or anxiolytic dependence, in remission: Secondary | ICD-10-CM | POA: Diagnosis not present

## 2021-01-24 MED ORDER — GABAPENTIN 400 MG PO CAPS
400.0000 mg | ORAL_CAPSULE | Freq: Three times a day (TID) | ORAL | 0 refills | Status: DC
  Filled 2021-01-24 – 2021-02-18 (×2): qty 90, 30d supply, fill #0

## 2021-01-24 MED ORDER — TRAZODONE HCL 100 MG PO TABS
50.0000 mg | ORAL_TABLET | Freq: Every evening | ORAL | 1 refills | Status: DC | PRN
  Filled 2021-01-24 – 2021-02-18 (×2): qty 30, 60d supply, fill #0

## 2021-01-24 MED ORDER — HYDROXYZINE HCL 25 MG PO TABS
25.0000 mg | ORAL_TABLET | Freq: Three times a day (TID) | ORAL | 1 refills | Status: DC | PRN
  Filled 2021-01-24 – 2021-02-18 (×2): qty 75, 25d supply, fill #0

## 2021-01-24 MED ORDER — PRAZOSIN HCL 2 MG PO CAPS
2.0000 mg | ORAL_CAPSULE | Freq: Every day | ORAL | 1 refills | Status: DC
  Filled 2021-01-24 – 2021-02-18 (×2): qty 30, 30d supply, fill #0

## 2021-01-24 MED ORDER — SERTRALINE HCL 100 MG PO TABS
100.0000 mg | ORAL_TABLET | Freq: Every day | ORAL | 1 refills | Status: DC
  Filled 2021-01-24 – 2021-02-18 (×2): qty 30, 30d supply, fill #0

## 2021-01-24 NOTE — Progress Notes (Addendum)
BH MD/PA/NP OP Progress Note  Virtual Visit via Telephone Note  I connected with [name] on 01/24/21 at  4:00 PM EDT by telephone and verified that I am speaking with the correct person using two identifiers.  Location: Patient: Home Provider: Clinic   I discussed the limitations, risks, security and privacy concerns of performing an evaluation and management service by telephone and the availability of in person appointments. I also discussed with the patient that there may be a patient responsible charge related to this service. The patient expressed understanding and agreed to proceed.  Follow Up Instructions:  I discussed the assessment and treatment plan with the patient. The patient was provided an opportunity to ask questions and all were answered. The patient agreed with the plan and demonstrated an understanding of the instructions.   The patient was advised to call back or seek an in-person evaluation if the symptoms worsen or if the condition fails to improve as anticipated.  I provided 18 minutes of non-face-to-face time during this encounter.  Meta Hatchet, PA   01/27/2021 7:24 AM [name]  MRN:  [id number]  Chief Complaint: Follow up and medication management  HPI:   [name] is a 40 year old female with a past psychiatric history significant for anxiety, major depressive disorder, sleep disturbances, PTSD, and severe benzodiazepine use disorder who presents to [facility] via virtual telephone visit for follow-up and medication management.  Patient is currently being managed on the following medications:  Hydroxyzine 25 mg 3 times daily as needed Gabapentin 400 mg 3 times daily Trazodone 100 mg at bedtime Sertraline 100 mg daily Prazosin 1 mg at bedtime  Patient reports that she has been taking trazodone 50 mg at bedtime with no success on the management of her sleep.  She reports that  the dosage does not do anything for her.  Patient was informed that according to her records, she is supposed to be taking trazodone 100 mg at bedtime for the management of her sleep.  Patient vocalized understanding.  Patient inquired about buspirone stating that she has taken the medication in the past to help with her sleep.  Although patient does not have a prescription for the medication, patient states that she has been taking 15 mg of buspirone at night for the management of her sleep.  Patient reports that her mood is not good at all.  Patient endorses the following depressive symptoms: low mood, irritability, decreased appetite, and fluctuations in her weight.  Patient also endorses anxiety stating that she would like to be placed on Xanax to help with her anxiety.  Patient was informed that due to her past drug use history, she would most likely be unable to be given benzodiazepine.  Patient admitted to provider that she has been purchasing the medication from her friend.  Provider informed patient to discontinue purchasing benzodiazepines from her friends got a prescription.  Patient endorses the following stressors in her life: current job and relationship issues.  Patient feels like she cannot handle anything anymore and snaps really easily at people.  Visit Diagnosis:    ICD-10-CM   1. 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 Anxiety  F41.9 hydrOXYzine (ATARAX/VISTARIL) 25 MG tablet    gabapentin (NEURONTIN) 400 MG capsule    3. Recurrent major depressive disorder, in partial remission (HCC)  F33.41 gabapentin (NEURONTIN) 400 MG capsule    traZODone (DESYREL) 100 MG tablet  4. PTSD (post-traumatic stress disorder)  F43.10 sertraline (ZOLOFT) 100 MG tablet    5. Sleep disturbances  G47.9 prazosin (MINIPRESS) 2 MG capsule    traZODone (DESYREL) 100 MG tablet      Past Psychiatric History:  Sleep disturbances Anxiety Major depressive  disorder PTSD Benzodiazepine use disorder  Past Medical History:  Past Medical History:  Diagnosis Date   Anxiety    Benzodiazepine abuse (HCC)    Bipolar 1 disorder (HCC)    Depression    Drug abuse (HCC)    ETOH abuse    Polysubstance abuse (HCC)    History reviewed. No pertinent surgical history.  Family Psychiatric History:  None reported  Family History:  Family History  Problem Relation Age of Onset   Drug abuse Mother    Anxiety disorder Mother    Anxiety disorder Father    Drug abuse Maternal Aunt    Alcohol abuse Maternal Grandfather    Drug abuse Cousin     Social History:  Social History   Socioeconomic History   Marital status: Single    Spouse name: Not on file   Number of children: 0   Years of education: Not on file   Highest education level: 11th grade  Occupational History   Not on file  Tobacco Use   Smoking status: Every Day    Packs/day: 2.00    Years: 17.00    Pack years: 34.00    Types: Cigarettes   Smokeless tobacco: Never  Vaping Use   Vaping Use: Never used  Substance and Sexual Activity   Alcohol use: Yes    Comment: +63   Drug use: Yes    Types: Marijuana, IV    Comment: UDS + THC   Sexual activity: Never  Other Topics Concern   Not on file  Social History Narrative   Not on file   Social Determinants of Health   Financial Resource Strain: Not on file  Food Insecurity: Not on file  Transportation Needs: Not on file  Physical Activity: Not on file  Stress: Not on file  Social Connections: Not on file    Allergies:  Allergies  Allergen Reactions   Haldol [Haloperidol Lactate] Other (See Comments)    Makes whole body stiff    Metabolic Disorder Labs: Lab Results  Component Value Date   HGBA1C 5.2 07/12/2020   MPG 102.54 07/12/2020   No results found for: PROLACTIN Lab Results  Component Value Date   CHOL 123 07/12/2020   TRIG 56 07/12/2020   HDL 39 (L) 07/12/2020   CHOLHDL 3.2 07/12/2020   VLDL 11  07/12/2020   LDLCALC 73 07/12/2020   Lab Results  Component Value Date   TSH 0.906 07/12/2020   TSH 1.29 02/24/2016    Therapeutic Level Labs: No results found for: LITHIUM Lab Results  Component Value Date   VALPROATE 74.4 04/26/2010   VALPROATE 75.3 01/21/2010   No components found for:  CBMZ  Current Medications: Current Outpatient Medications  Medication Sig Dispense Refill   albuterol (VENTOLIN HFA) 108 (90 Base) MCG/ACT inhaler Inhale 2 puffs into the lungs every 6 (six) hours as needed for wheezing or shortness of breath. 8.5 g 1   fluticasone (FLONASE) 50 MCG/ACT nasal spray Place 1 spray into both nostrils daily. For allergies (Patient not taking: No sig reported)  2   gabapentin (NEURONTIN) 400 MG capsule Take 1 capsule (400 mg total) by mouth 3 (three) times daily. For agitation 90 capsule 0  hydrOXYzine (ATARAX/VISTARIL) 25 MG tablet Take 1 tablet (25 mg total) by mouth 3 (three) times daily as needed for anxiety. 75 tablet 1   omeprazole (PRILOSEC OTC) 20 MG tablet Take 20 mg by mouth daily.     prazosin (MINIPRESS) 2 MG capsule Take 1 capsule (2 mg total) by mouth at bedtime for nightmares 30 capsule 1   sertraline (ZOLOFT) 100 MG tablet Take 1 tablet (100 mg total) by mouth at bedtime. 30 tablet 1   traZODone (DESYREL) 100 MG tablet Take 0.5 tablets (50 mg total) by mouth at bedtime as needed for sleep. Patient to take take full tablet (100 mg total) if sleeping disturbances persist. 30 tablet 1   No current facility-administered medications for this visit.     Musculoskeletal: Strength & Muscle Tone: Unable to assess due to telemedicine visit Gait & Station: Unable to assess due to telemedicine visit Patient leans: Unable to assess due to telemedicine visit  Psychiatric Specialty Exam: Review of Systems  Psychiatric/Behavioral:  Positive for agitation, decreased concentration and sleep disturbance. Negative for dysphoric mood, hallucinations, self-injury and  suicidal ideas. The patient is nervous/anxious. The patient is not hyperactive.    There were no vitals taken for this visit.There is no height or weight on file to calculate BMI.  General Appearance: Unable to assess due to telemedicine visit  Eye Contact:  Unable to assess due to telemedicine visit  Speech:  Clear and Coherent and Normal Rate  Volume:  Normal  Mood:  Anxious, Depressed, and Irritable  Affect:  Congruent and Depressed  Thought Process:  Coherent, Goal Directed, and Descriptions of Associations: Intact  Orientation:  Full (Time, Place, and Person)  Thought Content: WDL   Suicidal Thoughts:  No  Homicidal Thoughts:  No  Memory:  Immediate;   Fair Recent;   Fair Remote;   Fair  Judgement:  Fair  Insight:  Fair  Psychomotor Activity:  Normal  Concentration:  Concentration: Good and Attention Span: Good  Recall:  Good  Fund of Knowledge: Good  Language: Fair  Akathisia:  NA  Handed:  Right  AIMS (if indicated): done  Assets:  Desire for Improvement Resilience Social Support  ADL's:  Intact  Cognition: WNL  Sleep:  Fair   Screenings: AIMS    Flowsheet Row [facility]) [facility]  AIMS Total Score 0 0      AUDIT    Flowsheet Row [facility]) [facility] ([facility] (Dis[facility] ([facility]) [facility] ([facility]  Alcohol Use Disorder Identification Test Final Score (AUDIT) 2 2 22 29  33      GAD-7    Flowsheet Row Video Visit from 01/24/2021 in [facility] Counselor from 11/17/2020 in [facility] Office Visit from 10/08/2020 in [facility] Office Visit from 06/05/2016 in [facility] Office Visit from 04/05/2016 in [facility]  Total GAD-7 Score 19 20 19 11 9       PHQ2-9    Flowsheet Row Video Visit from 01/24/2021 in [facility] Counselor from 11/25/2020 in  [facility] Counselor from 11/17/2020 in [facility] Office Visit from 10/08/2020 in [facility] Video Visit from 07/16/2020 in [facility]  PHQ-2 Total Score 6 6 6 6  0  PHQ-9 Total Score 24 25 26 27  --      Flowsheet Row Video Visit from 01/24/2021 in [facility] Counselor from 11/25/2020 in [facility] (Discharged[facility]  C-SSRS RISK CATEGORY Moderate Risk Error: Question 6 not populated Low Risk        Assessment and Plan:   [name] is a 40 year old female with a past psychiatric history significant for anxiety, major depressive disorder, sleep disturbances, PTSD, and severe benzodiazepine use disorder who presents to [facility] via virtual telephone visit for follow-up and medication management.  Patient reports issues with her sleep, depressive symptoms, and anxiety.  She states that trazodone have not been helpful with her sleep and that she has been taking buspirone 15 mg at bedtime for the management of her sleep.  Patient also expresses that without benzodiazepine, her anxiety continues to be unmanaged.  Patient needs to buy benzodiazepines from her friends.  Patient was encouraged to discontinue purchasing medications that she does not  have a prescription for from her friends.  Provider informed patient to continue taking trazodone 100 mg at bedtime for the management of her sleep.  Provider informed patient that buspirone 15 mg 2 times daily will be added to her regimen for the management of her anxiety and possible sleep disturbances.  Patient was agreeable to recommendations.  Patient's medications to be e- prescribed to pharmacy of choice.  1. Anxiety  - hydrOXYzine (ATARAX/VISTARIL) 25 MG tablet; Take 1 tablet (25 mg total) by mouth 3 (three) times daily as needed for anxiety.  Dispense: 75 tablet; Refill: 1 - gabapentin (NEURONTIN) 400 MG capsule; Take 1 capsule (400 mg total) by mouth 3 (three) times daily. For agitation  Dispense: 90 capsule; Refill: 0  2. Recurrent major depressive disorder, in partial remission (HCC)  - gabapentin (NEURONTIN) 400 MG capsule; Take 1 capsule (400 mg total) by mouth 3 (three) times daily. For agitation  Dispense: 90 capsule; Refill: 0 - traZODone (DESYREL) 100 MG tablet; Take 0.5 tablets (50 mg total) by mouth at bedtime as needed for sleep. Patient to take take full tablet (100 mg total) if sleeping disturbances persist.  Dispense: 30 tablet; Refill: 1  3. 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 PTSD (post-traumatic stress disorder)  - sertraline (ZOLOFT) 100 MG tablet; Take 1 tablet (100 mg total) by mouth at bedtime.  Dispense: 30 tablet; Refill: 1  5. Sleep disturbances  - prazosin (MINIPRESS) 2 MG capsule; Take 1 capsule (2 mg total) by mouth at bedtime for nightmares  Dispense: 30 capsule; Refill: 1 - traZODone (DESYREL) 100 MG tablet; Take 0.5 tablets (50 mg total) by mouth at bedtime as needed for sleep. Patient to take take full tablet (100 mg total) if sleeping disturbances persist.  Dispense: 30 tablet; Refill: 1  Patient tot follow up in 7 weeks Provider spent a total of 18 minutes with the patient/reviewing patient's chart  41,  PA 01/24/2021, 4:23 PM

## 2021-01-25 ENCOUNTER — Other Ambulatory Visit: Payer: Self-pay

## 2021-01-28 ENCOUNTER — Other Ambulatory Visit: Payer: Self-pay

## 2021-01-28 MED ORDER — BUSPIRONE HCL 15 MG PO TABS
15.0000 mg | ORAL_TABLET | Freq: Two times a day (BID) | ORAL | 1 refills | Status: DC
  Filled 2021-01-28 – 2021-02-18 (×2): qty 60, 30d supply, fill #0

## 2021-01-28 NOTE — Addendum Note (Signed)
Addended by: Meta Hatchet on: 01/28/2021 05:52 AM   Modules accepted: Orders

## 2021-01-31 ENCOUNTER — Telehealth (HOSPITAL_COMMUNITY): Payer: No Payment, Other | Admitting: Physician Assistant

## 2021-02-01 ENCOUNTER — Other Ambulatory Visit: Payer: Self-pay

## 2021-02-04 ENCOUNTER — Other Ambulatory Visit: Payer: Self-pay

## 2021-02-21 ENCOUNTER — Other Ambulatory Visit: Payer: Self-pay

## 2021-03-18 ENCOUNTER — Other Ambulatory Visit: Payer: Self-pay

## 2021-03-18 ENCOUNTER — Encounter (HOSPITAL_COMMUNITY): Payer: Self-pay | Admitting: Physician Assistant

## 2021-03-18 ENCOUNTER — Telehealth (INDEPENDENT_AMBULATORY_CARE_PROVIDER_SITE_OTHER): Payer: No Payment, Other | Admitting: Physician Assistant

## 2021-03-18 DIAGNOSIS — F3341 Major depressive disorder, recurrent, in partial remission: Secondary | ICD-10-CM

## 2021-03-18 DIAGNOSIS — F419 Anxiety disorder, unspecified: Secondary | ICD-10-CM

## 2021-03-18 DIAGNOSIS — F4310 Post-traumatic stress disorder, unspecified: Secondary | ICD-10-CM | POA: Diagnosis not present

## 2021-03-18 DIAGNOSIS — G479 Sleep disorder, unspecified: Secondary | ICD-10-CM

## 2021-03-18 MED ORDER — TRAZODONE HCL 100 MG PO TABS
50.0000 mg | ORAL_TABLET | Freq: Every evening | ORAL | 1 refills | Status: DC | PRN
  Filled 2021-03-18: qty 30, 30d supply, fill #0

## 2021-03-18 MED ORDER — SERTRALINE HCL 100 MG PO TABS
100.0000 mg | ORAL_TABLET | Freq: Every day | ORAL | 1 refills | Status: DC
  Filled 2021-03-18: qty 30, 30d supply, fill #0

## 2021-03-18 MED ORDER — HYDROXYZINE HCL 25 MG PO TABS
25.0000 mg | ORAL_TABLET | Freq: Three times a day (TID) | ORAL | 1 refills | Status: DC | PRN
  Filled 2021-03-18 – 2021-07-08 (×3): qty 75, 25d supply, fill #0

## 2021-03-18 MED ORDER — PRAZOSIN HCL 2 MG PO CAPS
2.0000 mg | ORAL_CAPSULE | Freq: Every day | ORAL | 1 refills | Status: DC
Start: 2021-03-18 — End: 2022-03-27
  Filled 2021-03-18 – 2021-07-08 (×3): qty 30, 30d supply, fill #0
  Filled 2022-02-22: qty 30, 30d supply, fill #1

## 2021-03-18 MED ORDER — GABAPENTIN 400 MG PO CAPS
400.0000 mg | ORAL_CAPSULE | Freq: Three times a day (TID) | ORAL | 1 refills | Status: DC
  Filled 2021-03-18 – 2021-07-08 (×3): qty 90, 30d supply, fill #0

## 2021-03-18 MED ORDER — BUSPIRONE HCL 15 MG PO TABS
15.0000 mg | ORAL_TABLET | Freq: Two times a day (BID) | ORAL | 1 refills | Status: DC
Start: 2021-03-18 — End: 2021-09-07
  Filled 2021-03-18: qty 60, 30d supply, fill #0

## 2021-03-18 NOTE — Progress Notes (Addendum)
BH MD/PA/NP OP Progress Note  Virtual Visit via Telephone Note  I connected with [name] on 03/18/21 at  3:30 PM EST by telephone and verified that I am speaking with the correct person using two identifiers.  Location: Patient: Home Provider: Clinic   I discussed the limitations, risks, security and privacy concerns of performing an evaluation and management service by telephone and the availability of in person appointments. I also discussed with the patient that there may be a patient responsible charge related to this service. The patient expressed understanding and agreed to proceed.  Follow Up Instructions:  I discussed the assessment and treatment plan with the patient. The patient was provided an opportunity to ask questions and all were answered. The patient agreed with the plan and demonstrated an understanding of the instructions.   The patient was advised to call back or seek an in-person evaluation if the symptoms worsen or if the condition fails to improve as anticipated.  I provided 26 minutes of non-face-to-face time during this encounter.  Meta Hatchet, PA    03/18/2021 7:41 PM [name]  MRN:  [id number]  Chief Complaint: Follow up and medication management  HPI:   [name] is a 40 year old female with a past psychiatric history significant for anxiety, major depressive disorder, sleep disturbances, PTSD, and severe benzodiazepine use disorder who presents to [facility] via virtual telephone visit for follow-up and medication management.  Patient is currently being managed on the following medications:  Hydroxyzine 25 mg 3 times daily as needed Gabapentin 400 mg 3 times daily Trazodone 100 mg at bedtime Allowed to 100 mg at bedtime Prazosin 2 mg at bedtime Buspirone 15 mg 2 times daily  Patient reports that she has been taking sertraline 50 mg daily due to being without  medications.  Patient states that she has not been able to properly pick up her prescriptions due to lack of funds.  She does state that she tried taking sertraline 100 mg after being without her medication sometime but states that she felt extremely wired once taking the medication.  Patient reports that she has been taking sertraline 50 mg daily for roughly a month and states that her mood has been okay.  Patient still endorses depressive episodes and states that she has been experiencing a lack of interest in doing things along with decreased motivation.  States that she will lay in bed quite frequently and do nothing and further endorses difficulty sleeping, decreased concentration, self-isolation, and feelings of guilt/worthlessness.  Patient attributes her depression to her toxic living situation as well as her current relationship.  Patient rates her anxiety as 6 out of 10.  A PHQ-9 screen was performed with the patient scoring a 21.  A GAD-7 screen was also performed with the patient scoring an 18.  Patient is alert and oriented x4, calm, cooperative, and fully engaged in conversation during the encounter.  Patient endorses low mood.  Denies suicidal or homicidal ideations.  She further denies auditory or visual hallucinations and does not appear to be responding to internal/external stimuli.  Patient endorses fair sleep and receives on average 5 to 8 hours of sleep each night.  Patient endorses decreased appetite and eats on average 1 meal per day.  Patient denies alcohol consumption.  She endorses tobacco use and smokes on average a pack per day.  Patient endorses illicit drug use in the form of Xanax and marijuana.  Visit Diagnosis:  ICD-10-CM   1. PTSD (post-traumatic stress disorder)  F43.10 sertraline (ZOLOFT) 100 MG tablet    2. Anxiety  F41.9 busPIRone (BUSPAR) 15 MG tablet    hydrOXYzine (ATARAX) 25 MG tablet    gabapentin (NEURONTIN) 400 MG capsule    3. Sleep disturbances  G47.9  prazosin (MINIPRESS) 2 MG capsule    traZODone (DESYREL) 100 MG tablet    4. Recurrent major depressive disorder, in partial remission (HCC)  F33.41 gabapentin (NEURONTIN) 400 MG capsule    traZODone (DESYREL) 100 MG tablet      Past Psychiatric History:  Sleep disturbances Anxiety Major depressive disorder PTSD Benzodiazepine use disorder  Past Medical History:  Past Medical History:  Diagnosis Date   Anxiety    Benzodiazepine abuse (HCC)    Bipolar 1 disorder (HCC)    Depression    Drug abuse (HCC)    ETOH abuse    Polysubstance abuse (HCC)    History reviewed. No pertinent surgical history.  Family Psychiatric History:  None reported  Family History:  Family History  Problem Relation Age of Onset   Drug abuse Mother    Anxiety disorder Mother    Anxiety disorder Father    Drug abuse Maternal Aunt    Alcohol abuse Maternal Grandfather    Drug abuse Cousin     Social History:  Social History   Socioeconomic History   Marital status: Single    Spouse name: Not on file   Number of children: 0   Years of education: Not on file   Highest education level: 11th grade  Occupational History   Not on file  Tobacco Use   Smoking status: Every Day    Packs/day: 2.00    Years: 17.00    Pack years: 34.00    Types: Cigarettes   Smokeless tobacco: Never  Vaping Use   Vaping Use: Never used  Substance and Sexual Activity   Alcohol use: Yes    Comment: +63   Drug use: Yes    Types: Marijuana, IV    Comment: UDS + THC   Sexual activity: Never  Other Topics Concern   Not on file  Social History Narrative   Not on file   Social Determinants of Health   Financial Resource Strain: Not on file  Food Insecurity: Not on file  Transportation Needs: Not on file  Physical Activity: Not on file  Stress: Not on file  Social Connections: Not on file    Allergies:  Allergies  Allergen Reactions   Haldol [Haloperidol Lactate] Other (See Comments)    Makes whole  body stiff    Metabolic Disorder Labs: Lab Results  Component Value Date   HGBA1C 5.2 07/12/2020   MPG 102.54 07/12/2020   No results found for: PROLACTIN Lab Results  Component Value Date   CHOL 123 07/12/2020   TRIG 56 07/12/2020   HDL 39 (L) 07/12/2020   CHOLHDL 3.2 07/12/2020   VLDL 11 07/12/2020   LDLCALC 73 07/12/2020   Lab Results  Component Value Date   TSH 0.906 07/12/2020   TSH 1.29 02/24/2016    Therapeutic Level Labs: No results found for: LITHIUM Lab Results  Component Value Date   VALPROATE 74.4 04/26/2010   VALPROATE 75.3 01/21/2010   No components found for:  CBMZ  Current Medications: Current Outpatient Medications  Medication Sig Dispense Refill   albuterol (VENTOLIN HFA) 108 (90 Base) MCG/ACT inhaler Inhale 2 puffs into the lungs every 6 (six) hours as needed for  wheezing or shortness of breath. 8.5 g 1   busPIRone (BUSPAR) 15 MG tablet Take 1 tablet (15 mg total) by mouth 2 (two) times daily. 60 tablet 1   fluticasone (FLONASE) 50 MCG/ACT nasal spray Place 1 spray into both nostrils daily. For allergies (Patient not taking: No sig reported)  2   gabapentin (NEURONTIN) 400 MG capsule Take 1 capsule (400 mg total) by mouth 3 (three) times daily. For agitation 90 capsule 1   hydrOXYzine (ATARAX) 25 MG tablet Take 1 tablet (25 mg total) by mouth 3 (three) times daily as needed for anxiety. 75 tablet 1   omeprazole (PRILOSEC OTC) 20 MG tablet Take 20 mg by mouth daily.     prazosin (MINIPRESS) 2 MG capsule Take 1 capsule (2 mg total) by mouth at bedtime for nightmares 30 capsule 1   sertraline (ZOLOFT) 100 MG tablet Take 1 tablet (100 mg total) by mouth at bedtime. 30 tablet 1   traZODone (DESYREL) 100 MG tablet Take 0.5 tablets (50 mg total) by mouth at bedtime as needed for sleep. Patient to take take full tablet (100 mg total) if sleeping disturbances persist. 30 tablet 1   No current facility-administered medications for this visit.      Musculoskeletal: Strength & Muscle Tone: Unable to assess due to telemedicine visit Gait & Station: Unable to assess due to telemedicine visit Patient leans: Unable to assess due to telemedicine visit  Psychiatric Specialty Exam: Review of Systems  Psychiatric/Behavioral:  Positive for decreased concentration and sleep disturbance. Negative for dysphoric mood, hallucinations, self-injury and suicidal ideas. The patient is nervous/anxious. The patient is not hyperactive.    There were no vitals taken for this visit.There is no height or weight on file to calculate BMI.  General Appearance: Unable to assess due to telemedicine visit  Eye Contact:  Unable to assess due to telemedicine visit  Speech:  Clear and Coherent and Normal Rate  Volume:  Normal  Mood:  Anxious, Depressed, and Irritable  Affect:  Congruent and Depressed  Thought Process:  Coherent, Goal Directed, and Descriptions of Associations: Intact  Orientation:  Full (Time, Place, and Person)  Thought Content: WDL   Suicidal Thoughts:  No  Homicidal Thoughts:  No  Memory:  Immediate;   Fair Recent;   Fair Remote;   Fair  Judgement:  Fair  Insight:  Fair  Psychomotor Activity:  Normal  Concentration:  Concentration: Good and Attention Span: Good  Recall:  Good  Fund of Knowledge: Good  Language: Fair  Akathisia:  NA  Handed:  Right  AIMS (if indicated): done  Assets:  Communication Skills Desire for Improvement Resilience Social Support  ADL's:  Intact  Cognition: WNL  Sleep:  Fair   Screenings: AIMS    Flowsheet Row [facility]) [facility]  AIMS Total Score 0 0      AUDIT    Flowsheet Row [facility]) [facility] (Dis[facility] ([facility]) [facility]  Alcohol Use Disorder Identification Test Final Score (AUDIT) 33      GAD-7    Flowsheet Row Video Visit from 03/18/2021  in [facility] Video Visit from 01/24/2021 in [facility] Counselor from 11/17/2020 in [facility] Office Visit from 10/08/2020 in [facility] Office Visit from 06/05/2016 in [facility]  Total GAD-7 Score 18 19 20 19 11       PHQ2-9    Flowsheet Row Video Visit from 03/18/2021 in [facility] Video Visit from 01/24/2021 in [facility] Counselor from 11/25/2020 in [facility] Counselor from 11/17/2020 in [facility] Office Visit from 10/08/2020 in [facility]  PHQ-2 Total Score 6 6 6 6 6   PHQ-9 Total Score 21 24 25 26 27       Flowsheet Row Video Visit from 03/18/2021 in [facility] Video Visit from 01/24/2021 in [facility] Counselor from 11/25/2020 in [facility]  C-SSRS RISK CATEGORY Moderate Risk Moderate Risk Error: Question 6 not populated        Assessment and Plan:   [name] is a 40 year old female with a past psychiatric history significant for anxiety, major depressive disorder, sleep disturbances, PTSD, and severe benzodiazepine use disorder who presents to [facility] via virtual telephone visit for follow-up and medication management.  She reports that she has only been  taking sertraline 50 mg daily after experiencing adverse effects from taking 100 mg daily.  Patient also states that she has not been able to pick up her medications due to lack of funds.  Provider to keep patient's medications the same and will reassess patient's mood next appointment.  Patient's medications to be e-prescribed to pharmacy of choice.  Patient was advised to not abuse benzodiazepine.  Drug abuse meant treatment facilities.  Patient denied all for stating that she would like to try and quit using benzodiazepines on her own.  1. PTSD (post-traumatic stress disorder)  - sertraline (ZOLOFT) 100 MG tablet; Take 1 tablet (100 mg total) by mouth at bedtime.  Dispense: 30 tablet; Refill: 1  2. Anxiety  - busPIRone (BUSPAR) 15 MG tablet; Take 1 tablet (15 mg total) by mouth 2 (two) times daily.  Dispense: 60 tablet; Refill: 1 - hydrOXYzine (ATARAX) 25 MG tablet; Take 1 tablet (25 mg total) by mouth 3 (three) times daily as needed for anxiety.  Dispense: 75 tablet; Refill: 1 - gabapentin (NEURONTIN) 400 MG capsule; Take 1 capsule (400 mg total) by mouth 3 (three) times daily. For agitation  Dispense: 90 capsule; Refill: 1  3. Sleep disturbances  - prazosin (MINIPRESS) 2 MG capsule; Take 1 capsule (2 mg total) by mouth at bedtime for nightmares  Dispense: 30 capsule; Refill: 1 - traZODone (DESYREL) 100 MG tablet; Take 0.5 tablets (50 mg total) by mouth at bedtime as needed for sleep. Patient to take take full tablet (100 mg total) if sleeping disturbances persist.  Dispense: 30 tablet; Refill: 1  4. Recurrent major depressive disorder, in partial remission (HCC)  - gabapentin (NEURONTIN) 400 MG capsule; Take 1 capsule (400 mg total) by mouth 3 (three) times daily. For agitation  Dispense: 90 capsule; Refill: 1 - traZODone (DESYREL) 100 MG tablet; Take 0.5 tablets (50 mg total) by mouth at bedtime as needed for sleep. Patient to take take full tablet (100 mg total) if sleeping disturbances  persist.  Dispense: 30 tablet; Refill: 1  Patient to follow  up in 2 months Provider spent a total of 26 minutes with the patient/reviewing patient's chart  Meta Hatchet, PA 03/18/2021, 7:41 PM

## 2021-03-21 ENCOUNTER — Other Ambulatory Visit: Payer: Self-pay

## 2021-03-28 ENCOUNTER — Other Ambulatory Visit: Payer: Self-pay

## 2021-05-20 ENCOUNTER — Telehealth (HOSPITAL_COMMUNITY): Payer: No Payment, Other | Admitting: Physician Assistant

## 2021-05-20 ENCOUNTER — Encounter (HOSPITAL_COMMUNITY): Payer: Self-pay

## 2021-07-08 ENCOUNTER — Other Ambulatory Visit: Payer: Self-pay

## 2021-07-08 ENCOUNTER — Other Ambulatory Visit (HOSPITAL_COMMUNITY): Payer: Self-pay

## 2021-07-11 ENCOUNTER — Other Ambulatory Visit: Payer: Self-pay

## 2021-08-19 ENCOUNTER — Encounter (HOSPITAL_BASED_OUTPATIENT_CLINIC_OR_DEPARTMENT_OTHER): Payer: Self-pay | Admitting: Emergency Medicine

## 2021-08-19 ENCOUNTER — Other Ambulatory Visit: Payer: Self-pay

## 2021-08-19 ENCOUNTER — Emergency Department (HOSPITAL_BASED_OUTPATIENT_CLINIC_OR_DEPARTMENT_OTHER)
Admission: EM | Admit: 2021-08-19 | Discharge: 2021-08-19 | Disposition: A | Discharge status: Home / Self Care | Payer: Self-pay | Attending: Emergency Medicine | Admitting: Emergency Medicine

## 2021-08-19 ENCOUNTER — Emergency Department (HOSPITAL_BASED_OUTPATIENT_CLINIC_OR_DEPARTMENT_OTHER): Payer: Self-pay

## 2021-08-19 DIAGNOSIS — Z79899 Other long term (current) drug therapy: Secondary | ICD-10-CM | POA: Insufficient documentation

## 2021-08-19 DIAGNOSIS — R112 Nausea with vomiting, unspecified: Secondary | ICD-10-CM | POA: Insufficient documentation

## 2021-08-19 DIAGNOSIS — K625 Hemorrhage of anus and rectum: Secondary | ICD-10-CM | POA: Insufficient documentation

## 2021-08-19 DIAGNOSIS — R197 Diarrhea, unspecified: Secondary | ICD-10-CM | POA: Insufficient documentation

## 2021-08-19 DIAGNOSIS — R1084 Generalized abdominal pain: Secondary | ICD-10-CM | POA: Insufficient documentation

## 2021-08-19 LAB — COMPREHENSIVE METABOLIC PANEL
ALT: 10 U/L (ref 0–44)
AST: 13 U/L — ABNORMAL LOW (ref 15–41)
Albumin: 4 g/dL (ref 3.5–5.0)
Alkaline Phosphatase: 31 U/L — ABNORMAL LOW (ref 38–126)
Anion gap: 8 (ref 5–15)
BUN: 9 mg/dL (ref 6–20)
CO2: 26 mmol/L (ref 22–32)
Calcium: 9.1 mg/dL (ref 8.9–10.3)
Chloride: 105 mmol/L (ref 98–111)
Creatinine, Ser: 0.59 mg/dL (ref 0.44–1.00)
GFR, Estimated: >60 mL/min (ref >60)
Glucose, Bld: 108 mg/dL — ABNORMAL HIGH (ref 70–99)
Potassium: 3.6 mmol/L (ref 3.5–5.1)
Sodium: 139 mmol/L (ref 135–145)
Total Bilirubin: 0.3 mg/dL (ref 0.3–1.2)
Total Protein: 6.3 g/dL — ABNORMAL LOW (ref 6.5–8.1)

## 2021-08-19 LAB — URINALYSIS, ROUTINE W REFLEX MICROSCOPIC
Bilirubin Urine: NEGATIVE
Glucose, UA: NEGATIVE mg/dL
Ketones, ur: NEGATIVE mg/dL
Leukocytes,Ua: NEGATIVE
Nitrite: NEGATIVE
Protein, ur: 30 mg/dL — AB
Specific Gravity, Urine: 1.032 — ABNORMAL HIGH (ref 1.005–1.030)
pH: 6 (ref 5.0–8.0)

## 2021-08-19 LAB — OCCULT BLOOD X 1 CARD TO LAB, STOOL: Fecal Occult Bld: NEGATIVE

## 2021-08-19 LAB — CBC
HCT: 35 % — ABNORMAL LOW (ref 36.0–46.0)
Hemoglobin: 12.1 g/dL (ref 12.0–15.0)
MCH: 34 pg (ref 26.0–34.0)
MCHC: 34.6 g/dL (ref 30.0–36.0)
MCV: 98.3 fL (ref 80.0–100.0)
Platelets: 284 10*3/uL (ref 150–400)
RBC: 3.56 MIL/uL — ABNORMAL LOW (ref 3.87–5.11)
RDW: 11.9 % (ref 11.5–15.5)
WBC: 6.6 10*3/uL (ref 4.0–10.5)
nRBC: 0 % (ref 0.0–0.2)

## 2021-08-19 LAB — LIPASE, BLOOD: Lipase: 18 U/L (ref 11–51)

## 2021-08-19 LAB — PREGNANCY, URINE: Preg Test, Ur: NEGATIVE

## 2021-08-19 MED ORDER — METOCLOPRAMIDE HCL 5 MG/ML IJ SOLN
10.0000 mg | Freq: Once | INTRAMUSCULAR | Status: AC
  Administered 2021-08-19: 10 mg via INTRAVENOUS
  Filled 2021-08-19: qty 2

## 2021-08-19 MED ORDER — IOHEXOL 300 MG/ML  SOLN
100.0000 mL | Freq: Once | INTRAMUSCULAR | Status: AC | PRN
  Administered 2021-08-19: 100 mL via INTRAVENOUS

## 2021-08-19 MED ORDER — METOCLOPRAMIDE HCL 10 MG PO TABS
10.0000 mg | ORAL_TABLET | Freq: Four times a day (QID) | ORAL | 0 refills | Status: DC
  Filled 2021-08-19: qty 30, 8d supply, fill #0

## 2021-08-19 MED ORDER — SODIUM CHLORIDE 0.9 % IV BOLUS
1000.0000 mL | Freq: Once | INTRAVENOUS | Status: AC
Start: 2021-08-19 — End: 2021-08-19
  Administered 2021-08-19: 1000 mL via INTRAVENOUS

## 2021-08-19 NOTE — ED Triage Notes (Signed)
Pt c/o abd pain for "a couple of years." This episode of pain started last night. Pt had associated N/V/D. Pt reports bloody, mucous diarrhea.  ?

## 2021-08-19 NOTE — Discharge Instructions (Addendum)
You were seen in the emergency department today for abdominal pain. ? ?As we discussed your labs and imaging looked reassuring today.  I think following up with a gastroenterologist for a colonoscopy would be reasonable.  I have attached a chart of several primary doctors in the area, and if that you can make an appointment and they could send an order for colonoscopy or referral to GI. ? ?I have sent a prescription for the same nausea medicine you received in the ER. ? ?Continue to monitor how you're doing and return to the ER for new or worsening symptoms.  ?

## 2021-08-19 NOTE — ED Provider Notes (Signed)
?[facility] EMER[facility]ENCY DEPT ?Provider Note ? ? ?CSN: [id number] ?Arrival date & time: 08/19/21  1114 ? ?  ? ?History ? ?Chief Complaint  ?Patient presents with  ? Abdominal Pain  ? ? ?[name] is a 41 y.o. female who presents to the ER for abdominal pain.  Patient states she has had similar pain for "a couple of years", and this last episode started last night.  Describes the pain as sharp and intermittent.  She reports associated nausea and vomiting.  She also states that when having a bowel movement last night she noticed some "mucousy blood" in the toilet bowl, and when she was wiping.  There is no pain with the bowel movement.  She has been taking Bentyl. No fever, urinary symptoms, vaginal discharge. Pt also reported to provider in triage about intermittent passive suicidal thoughts with no intent, no hx of attempt.  ? ?The history is provided by the patient.  ?Abdominal Pain ?Associated symptoms: diarrhea, nausea and vomiting   ?Associated symptoms: no chest pain, no dysuria, no fever, no hematuria, no shortness of breath and no vaginal discharge   ? ?  ? ?Home Medications ?Prior to Admission medications   ?Medication Sig Start Date End Date Taking? Authorizing Provider  ?metoCLOPramide (REGLAN) 10 MG tablet Take 1 tablet (10 mg total) by mouth every 6 (six) hours. 08/19/21  Yes Rukiya Hodgkins T, PA-C  ?albuterol (VENTOLIN HFA) 108 (90 Base) MCG/ACT inhaler Inhale 2 puffs into the lungs every 6 (six) hours as needed for wheezing or shortness of breath. 10/08/20   Marcine Matar, MD  ?busPIRone (BUSPAR) 15 MG tablet Take 1 tablet (15 mg total) by mouth 2 (two) times daily. 03/18/21 03/18/22  Nwoko, Tommas Olp, PA  ?fluticasone (FLONASE) 50 MCG/ACT nasal spray Place 1 spray into both nostrils daily. For allergies ?Patient not taking: No sig reported 11/22/20   Armandina Stammer I, NP  ?gabapentin (NEURONTIN) 400 MG capsule Take 1 capsule (400 mg total) by mouth 3 (three) times daily for  agitation 03/18/21 03/18/22  Nwoko, Tommas Olp, PA  ?hydrOXYzine (ATARAX) 25 MG tablet Take 1 tablet (25 mg total) by mouth 3 (three) times daily as needed for anxiety. 03/18/21   Nwoko, Tommas Olp, PA  ?omeprazole (PRILOSEC OTC) 20 MG tablet Take 20 mg by mouth daily.    [provider]  ?prazosin (MINIPRESS) 2 MG capsule Take 1 capsule (2 mg total) by mouth at bedtime for nightmares 03/18/21   Nwoko, Tommas Olp, PA  ?sertraline (ZOLOFT) 100 MG tablet Take 1 tablet (100 mg total) by mouth at bedtime. 03/18/21   Nwoko, Tommas Olp, PA  ?traZODone (DESYREL) 100 MG tablet Take 0.5 tablets (50 mg total) by mouth at bedtime as needed for sleep. Patient to take take full tablet (100 mg total) if sleeping disturbances persist. 03/18/21   Nwoko, Tommas Olp, PA  ?mirtazapine (REMERON) 15 MG tablet Take 1 tablet (15 mg total) by mouth at bedtime. 01/30/20 03/23/20  [name], MD  ?   ? ?Allergies    ?Haldol [haloperidol lactate]   ? ?Review of Systems   ?Review of Systems  ?Constitutional:  Negative for fever.  ?Respiratory:  Negative for shortness of breath.   ?Cardiovascular:  Negative for chest pain.  ?Gastrointestinal:  Positive for abdominal pain, blood in stool, diarrhea, nausea and vomiting.  ?Genitourinary:  Negative for dysuria, frequency, hematuria, pelvic pain, urgency and vaginal discharge.  ?All other systems reviewed and are negative. ? ?Physical Exam ?Updated Vital  Signs ?BP 116/74   Pulse 63   Temp 97.7 ?F (36.5 ?C) (Oral)   Resp 20   Ht 5\' 2"  (1.575 m)   Wt 59 kg   LMP 08/10/2021 Comment: neg preg test  SpO2 96%   BMI 23.78 kg/m?  ?Physical Exam ?Vitals and nursing note reviewed. Exam conducted with a chaperone present.  ?Constitutional:   ?   Appearance: Normal appearance.  ?HENT:  ?   Head: Normocephalic and atraumatic.  ?Eyes:  ?   Conjunctiva/sclera: Conjunctivae normal.  ?Cardiovascular:  ?   Rate and Rhythm: Normal rate and regular rhythm.  ?Pulmonary:  ?   Effort: Pulmonary effort is normal.  No respiratory distress.  ?   Breath sounds: Normal breath sounds.  ?Abdominal:  ?   General: There is no distension.  ?   Palpations: Abdomen is soft.  ?   Tenderness: There is generalized abdominal tenderness. There is no guarding.  ?Genitourinary: ?   Rectum: Normal. Guaiac result negative. No tenderness, anal fissure, external hemorrhoid or internal hemorrhoid.  ?Skin: ?   General: Skin is warm and dry.  ?Neurological:  ?   General: No focal deficit present.  ?   Mental Status: She is alert.  ?Psychiatric:     ?   Attention and Perception: Attention normal.     ?   Mood and Affect: Mood normal.     ?   Speech: Speech normal.     ?   Thought Content: Thought content does not include homicidal or suicidal plan.  ?   Comments: Intermittent passive suicidal thoughts, no intent or hx of attempts  ? ? ?ED Results / Procedures / Treatments   ?Labs ?(all labs ordered are listed, but only abnormal results are displayed) ?Labs Reviewed  ?COMPREHENSIVE METABOLIC PANEL - Abnormal; Notable for the following components:  ?    Result Value  ? Glucose, Bld 108 (*)   ? Total Protein 6.3 (*)   ? AST 13 (*)   ? Alkaline Phosphatase 31 (*)   ? All other components within normal limits  ?CBC - Abnormal; Notable for the following components:  ? RBC 3.56 (*)   ? HCT 35.0 (*)   ? All other components within normal limits  ?URINALYSIS, ROUTINE W REFLEX MICROSCOPIC - Abnormal; Notable for the following components:  ? Specific Gravity, Urine 1.032 (*)   ? Hgb urine dipstick TRACE (*)   ? Protein, ur 30 (*)   ? Bacteria, UA RARE (*)   ? All other components within normal limits  ?LIPASE, BLOOD  ?PREGNANCY, URINE  ?OCCULT BLOOD X 1 CARD TO LAB, STOOL  ? ? ?EKG ?None ? ?Radiology ?CT ABDOMEN PELVIS W CONTRAST ? ?Result Date: 08/19/2021 ?CLINICAL DATA:  Abdominal pain, acute, nonlocalized EXAM: CT ABDOMEN AND PELVIS WITH CONTRAST TECHNIQUE: Multidetector CT imaging of the abdomen and pelvis was performed using the standard protocol following  bolus administration of intravenous contrast. RADIATION DOSE REDUCTION: This exam was performed according to the departmental dose-optimization program which includes automated exposure control, adjustment of the mA and/or kV according to patient size and/or use of iterative reconstruction technique. CONTRAST:  100mL OMNIPAQUE IOHEXOL 300 MG/ML  SOLN COMPARISON:  January 06, 2020 FINDINGS: Lower chest: No acute abnormality. Hepatobiliary: Mild-to-moderate hepatomegaly with liver measuring 19 cm with the right lobe of the liver extending to the upper pelvis. No focal liver abnormality is seen. Gallbladder is contracted without radiopaque gallstones, gallbladder wall thickening, or biliary dilatation. Pancreas: Unremarkable. No  pancreatic ductal dilatation or surrounding inflammatory changes. Spleen: Normal in size without focal abnormality. Adrenals/Urinary Tract: Adrenal glands are unremarkable. Kidneys are normal, without renal calculi, focal lesion, or hydronephrosis. Bladder is unremarkable. Stomach/Bowel: Stomach is within normal limits. Appendix appears normal. No evidence of bowel wall thickening, distention, or inflammatory changes. Vascular/Lymphatic: No significant vascular findings are present. No enlarged abdominal or pelvic lymph nodes. Reproductive: Uterus and bilateral adnexa are unremarkable. Other: No abdominal wall hernia or abnormality. No abdominopelvic ascites. Musculoskeletal: No acute or significant osseous findings. IMPRESSION: No free air, fluid, inflammatory changes, mass or significant lymphadenopathy. Hepatomegaly. Electronically Signed   By: Marjo Bicker M.D.   On: 08/19/2021 15:32   ? ?Procedures ?Procedures  ? ? ?Medications Ordered in ED ?Medications  ?iohexol (OMNIPAQUE) 300 MG/ML solution 100 mL (100 mLs Intravenous Contrast Given 08/19/21 1446)  ?sodium chloride 0.9 % bolus 1,000 mL ( Intravenous Stopped 08/19/21 1714)  ?metoCLOPramide (REGLAN) injection 10 mg (10 mg Intravenous  Given 08/19/21 1612)  ? ? ?ED Course/ Medical Decision Making/ A&P ?  ?                        ?Medical Decision Making ?Amount and/or Complexity of Data Reviewed ?Labs: ordered. ?Radiology: ordered. ? ?Risk ?Prescript

## 2021-08-19 NOTE — ED Triage Notes (Signed)
Pt having SI thoughts without intent.  ?

## 2021-08-22 ENCOUNTER — Other Ambulatory Visit: Payer: Self-pay

## 2021-09-07 ENCOUNTER — Telehealth (INDEPENDENT_AMBULATORY_CARE_PROVIDER_SITE_OTHER): Payer: No Payment, Other | Admitting: Physician Assistant

## 2021-09-07 ENCOUNTER — Encounter (HOSPITAL_COMMUNITY): Payer: Self-pay | Admitting: Physician Assistant

## 2021-09-07 DIAGNOSIS — F3341 Major depressive disorder, recurrent, in partial remission: Secondary | ICD-10-CM | POA: Diagnosis not present

## 2021-09-07 DIAGNOSIS — F419 Anxiety disorder, unspecified: Secondary | ICD-10-CM

## 2021-09-07 MED ORDER — GABAPENTIN 400 MG PO CAPS
400.0000 mg | ORAL_CAPSULE | Freq: Three times a day (TID) | ORAL | 1 refills | Status: DC
  Filled 2021-09-07: qty 90, 30d supply, fill #0

## 2021-09-07 MED ORDER — HYDROXYZINE HCL 25 MG PO TABS
25.0000 mg | ORAL_TABLET | Freq: Three times a day (TID) | ORAL | 1 refills | Status: DC | PRN
  Filled 2021-09-07: qty 75, 25d supply, fill #0

## 2021-09-07 MED ORDER — BUSPIRONE HCL 15 MG PO TABS
15.0000 mg | ORAL_TABLET | Freq: Two times a day (BID) | ORAL | 1 refills | Status: DC
  Filled 2021-09-07: qty 60, 30d supply, fill #0

## 2021-09-07 NOTE — Progress Notes (Unsigned)
BH MD/PA/NP OP Progress Note  Virtual Visit via Video Note  I connected with [name] on 09/07/21 at  2:30 PM EDT by a video enabled telemedicine application and verified that I am speaking with the correct person using two identifiers.  Location: Patient: Secure area at work Provider: Clinic   I discussed the limitations of evaluation and management by telemedicine and the availability of in person appointments. The patient expressed understanding and agreed to proceed.  Follow Up Instructions:   I discussed the assessment and treatment plan with the patient. The patient was provided an opportunity to ask questions and all were answered. The patient agreed with the plan and demonstrated an understanding of the instructions.   The patient was advised to call back or seek an in-person evaluation if the symptoms worsen or if the condition fails to improve as anticipated.  I provided 20 minutes of non-face-to-face time during this encounter.  Meta HatchetUchenna E Kaian Fahs, PA    09/07/2021 10:55 PM [name]  MRN:  [id number]  Chief Complaint:  Chief Complaint   Follow-up; Medication Refill     HPI: ***  [name] is a 41 year old female with a past psychiatric history significant for anxiety, major depressive disorder, sleep disturbances, PTSD, and severe benzodiazepine use disorder [facility] via virtual video visit for follow-up and medication management.  Patient was last seen by this provider on 03/18/2021.  Patient is currently being managed on the following medications:  Hydroxyzine 25 mg 3 times daily as needed Gabapentin 400 mg 3 times daily Trazodone 100 mg at bedtime Sertraline 100 mg at bedtime Prazosin 2 mg at bedtime Buspirone 15 mg 2 times daily    Patient is alert and oriented x4, calm, cooperative, and fully engaged in conversation during the encounter.  Patient endorses low mood.   Denies suicidal or homicidal ideations.  She further denies auditory or visual hallucinations and does not appear to be responding to internal/external stimuli.  Patient endorses fair sleep and receives on average 5 to 8 hours of sleep each night.  Patient endorses decreased appetite and eats on average 1 meal per day.  Patient denies alcohol consumption.  She endorses tobacco use and smokes on average a pack per day.  Patient endorses illicit drug use in the form of Xanax and marijuana.  Visit Diagnosis:    ICD-10-CM   1. Anxiety  F41.9 gabapentin (NEURONTIN) 400 MG capsule    hydrOXYzine (ATARAX) 25 MG tablet    busPIRone (BUSPAR) 15 MG tablet    2. Recurrent major depressive disorder, in partial remission (HCC)  F33.41 gabapentin (NEURONTIN) 400 MG capsule      Past Psychiatric History:  Sleep disturbances Anxiety Major depressive disorder PTSD Benzodiazepine use disorder  Past Medical History:  Past Medical History:  Diagnosis Date   Anxiety    Benzodiazepine abuse (HCC)    Bipolar 1 disorder (HCC)    Depression    Drug abuse (HCC)    ETOH abuse    Polysubstance abuse (HCC)    History reviewed. No pertinent surgical history.  Family Psychiatric History:  None reported  Family History:  Family History  Problem Relation Age of Onset   Drug abuse Mother    Anxiety disorder Mother    Anxiety disorder Father    Drug abuse Maternal Aunt    Alcohol abuse Maternal Grandfather    Drug abuse Cousin     Social History:  Social History  Socioeconomic History   Marital status: Single    Spouse name: Not on file   Number of children: 0   Years of education: Not on file   Highest education level: 11th grade  Occupational History   Not on file  Tobacco Use   Smoking status: Every Day    Packs/day: 2.00    Years: 17.00    Pack years: 34.00    Types: Cigarettes   Smokeless tobacco: Never  Vaping Use   Vaping Use: Never used  Substance and Sexual Activity   Alcohol  use: Yes    Comment: +63   Drug use: Yes    Types: Marijuana, IV    Comment: UDS + THC; xanax   Sexual activity: Never  Other Topics Concern   Not on file  Social History Narrative   Not on file   Social Determinants of Health   Financial Resource Strain: Not on file  Food Insecurity: Not on file  Transportation Needs: Not on file  Physical Activity: Not on file  Stress: Not on file  Social Connections: Not on file    Allergies:  Allergies  Allergen Reactions   Haldol [Haloperidol Lactate] Other (See Comments)    Makes whole body stiff    Metabolic Disorder Labs: Lab Results  Component Value Date   HGBA1C 5.2 07/12/2020   MPG 102.54 07/12/2020   No results found for: PROLACTIN Lab Results  Component Value Date   CHOL 123 07/12/2020   TRIG 56 07/12/2020   HDL 39 (L) 07/12/2020   CHOLHDL 3.2 07/12/2020   VLDL 11 07/12/2020   LDLCALC 73 07/12/2020   Lab Results  Component Value Date   TSH 0.906 07/12/2020   TSH 1.29 02/24/2016    Therapeutic Level Labs: No results found for: LITHIUM Lab Results  Component Value Date   VALPROATE 74.4 04/26/2010   VALPROATE 75.3 01/21/2010   No components found for:  CBMZ  Current Medications: Current Outpatient Medications  Medication Sig Dispense Refill   albuterol (VENTOLIN HFA) 108 (90 Base) MCG/ACT inhaler Inhale 2 puffs into the lungs every 6 (six) hours as needed for wheezing or shortness of breath. 8.5 g 1   busPIRone (BUSPAR) 15 MG tablet Take 1 tablet (15 mg total) by mouth 2 (two) times daily. 60 tablet 1   fluticasone (FLONASE) 50 MCG/ACT nasal spray Place 1 spray into both nostrils daily. For allergies (Patient not taking: No sig reported)  2   gabapentin (NEURONTIN) 400 MG capsule Take 1 capsule (400 mg total) by mouth 3 (three) times daily for agitation 90 capsule 1   hydrOXYzine (ATARAX) 25 MG tablet Take 1 tablet (25 mg total) by mouth 3 (three) times daily as needed for anxiety. 75 tablet 1    metoCLOPramide (REGLAN) 10 MG tablet Take 1 tablet (10 mg total) by mouth every 6 (six) hours. 30 tablet 0   omeprazole (PRILOSEC OTC) 20 MG tablet Take 20 mg by mouth daily.     prazosin (MINIPRESS) 2 MG capsule Take 1 capsule (2 mg total) by mouth at bedtime for nightmares 30 capsule 1   sertraline (ZOLOFT) 100 MG tablet Take 1 tablet (100 mg total) by mouth at bedtime. 30 tablet 1   traZODone (DESYREL) 100 MG tablet Take 0.5 tablets (50 mg total) by mouth at bedtime as needed for sleep. Patient to take take full tablet (100 mg total) if sleeping disturbances persist. 30 tablet 1   No current facility-administered medications for this visit.  Musculoskeletal: Strength & Muscle Tone: Within normal limits Gait & Station: Normal Patient leans: N/A  Psychiatric Specialty Exam: Review of Systems  Psychiatric/Behavioral:  Positive for decreased concentration and sleep disturbance. Negative for dysphoric mood, hallucinations, self-injury and suicidal ideas. The patient is nervous/anxious. The patient is not hyperactive.    Last menstrual period 08/10/2021.There is no height or weight on file to calculate BMI.  General Appearance: Unable to assess due to telemedicine visit  Eye Contact:  Unable to assess due to telemedicine visit  Speech:  Clear and Coherent and Normal Rate  Volume:  Normal  Mood:  Anxious, Depressed, and Irritable  Affect:  Congruent and Depressed  Thought Process:  Coherent, Goal Directed, and Descriptions of Associations: Intact  Orientation:  Full (Time, Place, and Person)  Thought Content: WDL   Suicidal Thoughts:  No  Homicidal Thoughts:  No  Memory:  Immediate;   Fair Recent;   Fair Remote;   Fair  Judgement:  Fair  Insight:  Fair  Psychomotor Activity:  Normal  Concentration:  Concentration: Good and Attention Span: Good  Recall:  Good  Fund of Knowledge: Good  Language: Fair  Akathisia:  NA  Handed:  Right  AIMS (if indicated): done  Assets:   Communication Skills Desire for Improvement Resilience Social Support  ADL's:  Intact  Cognition: WNL  Sleep:  Fair   Screenings: AIMS    Flowsheet Row [facility]) [facility]  AIMS Total Score 0 0      AUDIT    Flowsheet Row [facility]) [facility] ([facility] Ad[facility] (Dis[facility] ([facility] ([facility]) [facility]  Alcohol Use Disorder Identification Test Final Score (AUDIT) 33      GAD-7    Flowsheet Row Video Visit from 09/07/2021 in [facility] Video Visit from 03/18/2021 in [facility] Video Visit from 01/24/2021 in [facility] Counselor from 11/17/2020 in [facility] Office Visit from 10/08/2020 in [facility]  Total GAD-7 Score PHQ2-9    Flowsheet Row Video Visit from 09/07/2021 in [facility] Video Visit from 03/18/2021 in [facility] Video Visit from 01/24/2021 in [facility] Counselor from 11/25/2020 in [facility] Counselor from 11/17/2020 in [facility]  PHQ-2 Total Score PHQ-9 Total Score Flowsheet Row Video Visit from 09/07/2021 in [facility] ED from 08/19/2021 in [facility] Video Visit  from 03/18/2021 in [facility]  C-SSRS RISK CATEGORY Moderate Risk Moderate Risk Moderate Risk        Assessment and Plan: ***  [name] is a 41 year old female with a past psychiatric history significant for anxiety, major depressive disorder, sleep disturbances, PTSD, and severe benzodiazepine use disorder who presents to [facility] via virtual telephone visit for follow-up and medication management.  Collaboration of Care: Collaboration of Care: Medication Management AEB provider managing patient's psychiatric medications and Psychiatrist AEB patient being followed by mental health provider  Patient/Guardian was advised Release of Information must be obtained prior to any record release in order to collaborate their care with an outside provider. Patient/Guardian was advised if they have not already done so to contact the registration department to sign all necessary forms in order for [place] to release information regarding their care.   Consent: Patient/Guardian gives verbal consent for treatment and assignment of benefits for services provided during this visit. Patient/Guardian expressed understanding and agreed to proceed.  1. Anxiety  - gabapentin (NEURONTIN) 400 MG capsule; Take 1 capsule (400 mg total) by mouth 3 (three) times daily for agitation  Dispense: 90 capsule; Refill: 1 - hydrOXYzine (ATARAX) 25 MG tablet; Take 1 tablet (25 mg total) by mouth 3 (three) times daily as needed for anxiety.  Dispense: 75 tablet; Refill: 1 - busPIRone (BUSPAR) 15 MG tablet; Take 1 tablet (15 mg total) by mouth 2 (two) times daily.  Dispense: 60 tablet; Refill: 1  2. Recurrent major depressive disorder, in partial remission (HCC)  - gabapentin (NEURONTIN) 400 MG capsule; Take 1 capsule (400 mg total) by mouth 3 (three) times daily for agitation  Dispense: 90 capsule; Refill: 1  Patient to follow up in 2  months Provider spent a total of 20 minutes with the patient/reviewing patient's chart  Meta Hatchet, PA 09/07/2021, 10:55 PM

## 2021-09-08 ENCOUNTER — Other Ambulatory Visit: Payer: Self-pay

## 2021-11-02 ENCOUNTER — Encounter (HOSPITAL_COMMUNITY): Payer: Self-pay

## 2021-11-03 ENCOUNTER — Encounter (HOSPITAL_COMMUNITY): Payer: Self-pay

## 2021-11-03 ENCOUNTER — Telehealth (HOSPITAL_COMMUNITY): Payer: No Payment, Other | Admitting: Physician Assistant

## 2022-01-10 ENCOUNTER — Other Ambulatory Visit: Payer: Self-pay

## 2022-01-10 ENCOUNTER — Telehealth (HOSPITAL_COMMUNITY): Payer: Self-pay | Admitting: Licensed Clinical Social Worker

## 2022-01-10 ENCOUNTER — Encounter (HOSPITAL_COMMUNITY): Payer: Self-pay | Admitting: Psychiatry

## 2022-01-10 ENCOUNTER — Telehealth (INDEPENDENT_AMBULATORY_CARE_PROVIDER_SITE_OTHER): Payer: No Payment, Other | Admitting: Psychiatry

## 2022-01-10 ENCOUNTER — Ambulatory Visit (HOSPITAL_COMMUNITY): Payer: No Payment, Other | Admitting: Licensed Clinical Social Worker

## 2022-01-10 DIAGNOSIS — F1121 Opioid dependence, in remission: Secondary | ICD-10-CM

## 2022-01-10 DIAGNOSIS — F1320 Sedative, hypnotic or anxiolytic dependence, uncomplicated: Secondary | ICD-10-CM

## 2022-01-10 DIAGNOSIS — F4310 Post-traumatic stress disorder, unspecified: Secondary | ICD-10-CM

## 2022-01-10 DIAGNOSIS — F1191 Opioid use, unspecified, in remission: Secondary | ICD-10-CM

## 2022-01-10 DIAGNOSIS — F1090 Alcohol use, unspecified, uncomplicated: Secondary | ICD-10-CM

## 2022-01-10 DIAGNOSIS — F1491 Cocaine use, unspecified, in remission: Secondary | ICD-10-CM

## 2022-01-10 DIAGNOSIS — F13930 Sedative, hypnotic or anxiolytic use, unspecified with withdrawal, uncomplicated: Secondary | ICD-10-CM

## 2022-01-10 DIAGNOSIS — F332 Major depressive disorder, recurrent severe without psychotic features: Secondary | ICD-10-CM

## 2022-01-10 DIAGNOSIS — F3341 Major depressive disorder, recurrent, in partial remission: Secondary | ICD-10-CM | POA: Diagnosis not present

## 2022-01-10 DIAGNOSIS — F1091 Alcohol use, unspecified, in remission: Secondary | ICD-10-CM

## 2022-01-10 DIAGNOSIS — F418 Other specified anxiety disorders: Secondary | ICD-10-CM

## 2022-01-10 DIAGNOSIS — F419 Anxiety disorder, unspecified: Secondary | ICD-10-CM

## 2022-01-10 MED ORDER — HYDROXYZINE HCL 25 MG PO TABS
25.0000 mg | ORAL_TABLET | Freq: Three times a day (TID) | ORAL | 1 refills | Status: AC | PRN
Start: 2022-01-10 — End: 2022-03-24
  Filled 2022-01-10: qty 90, 30d supply, fill #0
  Filled 2022-02-22: qty 90, 30d supply, fill #1

## 2022-01-10 MED ORDER — BUSPIRONE HCL 15 MG PO TABS
15.0000 mg | ORAL_TABLET | Freq: Two times a day (BID) | ORAL | 1 refills | Status: DC
  Filled 2022-01-10: qty 60, 30d supply, fill #0

## 2022-01-10 MED ORDER — GABAPENTIN 400 MG PO CAPS
400.0000 mg | ORAL_CAPSULE | Freq: Three times a day (TID) | ORAL | 1 refills | Status: DC
Start: 2022-01-10 — End: 2022-03-27
  Filled 2022-01-10: qty 90, 30d supply, fill #0
  Filled 2022-02-22: qty 90, 30d supply, fill #1

## 2022-01-10 NOTE — Patient Instructions (Signed)
Thank you for attending your appointment today.  -- INCREASE Buspirone to 15 mg twice a day -- INCREASE gabapentin to 400 mg three times a day -- Continue Atarax 25 mg up to three times a day as needed for anxiety or sleep -- Abstain from substance use; if you notice worsening symptoms of withdrawal including elevated blood pressure, hallucinations, confusions, or seizures present to the nearest emergency room or call 911.  -- Continue other medications as prescribed.  Please do not make any changes to medications without first discussing with your provider. If you are experiencing a psychiatric emergency, please call 911 or present to your nearest emergency department. Additional crisis, medication management, and therapy resources are included below.  [facility]  [address] [place],  [address] [phone] or [phone] [facility] 24/7 FOR ANYONE [address] [place], [place] Fax: [phone] [email or web address] *Interpreters available *Accepts all insurance and uninsured for Urgent Care needs *Accepts Medicaid and uninsured for outpatient treatment (below)      ONLY FOR [facility]  Below:    Outpatient New Patient Assessment/Therapy Walk-ins:        Monday -Thursday 8am until slots are full.        Every Friday 1pm-4pm  (first come, first served)                   New Patient Psychiatry/Medication Management        Monday-Friday 8am-11am (first come, first served)               For all walk-ins we ask that you arrive by 7:15am, because patients will be seen in the order of arrival.

## 2022-01-10 NOTE — Telephone Encounter (Signed)
Cln called to clarify pt's reported withdrawal. Pt reports she uses Xanax and is slowly tapering herself. "I know that stopping cold [place] is dangerous." She states she cannot go inpatient to detox at this time due to having a dog to take care of and needing to work. She reports symptoms of increased anxiety, feeling clammy, hot, and sweaty. She also reports taking gabapentin. She asks cln if arm tingling/numbness is normal, to which cln advises pt to ask a medical provider and informs pt she will be able to do so if admitted to [facility]. Cln also informed pt of med management options at [facility]. Pt verbalized understanding.
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
## 2022-01-11 ENCOUNTER — Ambulatory Visit (INDEPENDENT_AMBULATORY_CARE_PROVIDER_SITE_OTHER): Payer: No Payment, Other | Admitting: Licensed Clinical Social Worker

## 2022-01-11 ENCOUNTER — Encounter (HOSPITAL_COMMUNITY): Payer: Self-pay

## 2022-01-11 ENCOUNTER — Telehealth (HOSPITAL_COMMUNITY): Payer: Self-pay | Admitting: *Deleted

## 2022-01-11 ENCOUNTER — Other Ambulatory Visit: Payer: Self-pay

## 2022-01-11 DIAGNOSIS — F332 Major depressive disorder, recurrent severe without psychotic features: Secondary | ICD-10-CM
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
## 2022-01-11 NOTE — Telephone Encounter (Signed)
Former patient of [employer]. Called the [facility] office & LVM stating that she would like to speak to a Provider or Nurse  Concerning a question she wanted to know  what happens if wean off Benzo's like Xanax??

## 2022-01-12 ENCOUNTER — Ambulatory Visit (INDEPENDENT_AMBULATORY_CARE_PROVIDER_SITE_OTHER): Payer: No Payment, Other | Admitting: Licensed Clinical Social Worker

## 2022-01-12 DIAGNOSIS — F332 Major depressive disorder, recurrent severe without psychotic features: Secondary | ICD-10-CM

## 2022-01-12 DIAGNOSIS — F1320 Sedative, hypnotic or anxiolytic dependence, uncomplicated: Secondary | ICD-10-CM

## 2022-01-12 DIAGNOSIS — F419 Anxiety disorder, unspecified: Secondary | ICD-10-CM

## 2022-01-12 DIAGNOSIS — F4310 Post-traumatic stress disorder, unspecified: Secondary | ICD-10-CM

## 2022-01-12 NOTE — Telephone Encounter (Signed)
Patient called [name] on yesterday & LVM
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
## 2022-01-12 NOTE — Telephone Encounter (Signed)
LVM letting patient know I was checking in to make sure everything is ok ? And to f/u on her ?? What happens if you wean off Benzo's like Xanax??  Did leave call back #

## 2022-01-13 ENCOUNTER — Ambulatory Visit (HOSPITAL_COMMUNITY): Payer: No Payment, Other

## 2022-01-16 ENCOUNTER — Ambulatory Visit (INDEPENDENT_AMBULATORY_CARE_PROVIDER_SITE_OTHER): Payer: No Payment, Other | Admitting: Licensed Clinical Social Worker

## 2022-01-16 ENCOUNTER — Telehealth (HOSPITAL_COMMUNITY): Payer: Self-pay | Admitting: Professional

## 2022-01-16 DIAGNOSIS — F332 Major depressive disorder, recurrent severe without psychotic features: Secondary | ICD-10-CM

## 2022-01-16 NOTE — Progress Notes (Deleted)
Spoke with patient via Webex video call, used 2 identifiers to correctly identify patient. States that groups are going good. Feels hopeless but denies SI/HI or AV hallucinations. On scale 1-10 as 10 being worst she rates depression at 3/4 and anxiety at 5. Had a bad day last Friday with withdrawal symptoms from Xanax. No other issues or complaints. No side effects from medications.

## 2022-01-17 ENCOUNTER — Ambulatory Visit (INDEPENDENT_AMBULATORY_CARE_PROVIDER_SITE_OTHER): Payer: No Payment, Other | Admitting: Psychiatry

## 2022-01-17 ENCOUNTER — Encounter (HOSPITAL_COMMUNITY): Payer: Self-pay | Admitting: Psychiatry

## 2022-01-17 DIAGNOSIS — F419 Anxiety disorder, unspecified: Secondary | ICD-10-CM | POA: Diagnosis not present

## 2022-01-17 DIAGNOSIS — F332 Major depressive disorder, recurrent severe without psychotic features: Secondary | ICD-10-CM

## 2022-01-17 DIAGNOSIS — F4310 Post-traumatic stress disorder, unspecified: Secondary | ICD-10-CM

## 2022-01-17 DIAGNOSIS — F1320 Sedative, hypnotic or anxiolytic dependence, uncomplicated: Secondary | ICD-10-CM | POA: Diagnosis not present

## 2022-01-17 NOTE — Progress Notes (Signed)
[facility] MD/PA/NP PHP Progress Note  01/17/2022 11:35 AM [name]  MRN:  [id number] Virtual Visit via Video Note  I connected with [name] on 01/17/22 at  9:00 AM EDT by a video enabled telemedicine application and verified that I am speaking with the correct person using two identifiers.  Location: Patient: Home Provider: Clinic   I discussed the limitations of evaluation and management by telemedicine and the availability of in person appointments. The patient expressed understanding and agreed to proceed.   I discussed the assessment and treatment plan with the patient. The patient was provided an opportunity to ask questions and all were answered. The patient agreed with the plan and demonstrated an understanding of the instructions.   The patient was advised to call back or seek an in-person evaluation if the symptoms worsen or if the condition fails to improve as anticipated.  I provided 10 minutes of non-face-to-face time during this encounter.   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  Patient started PHP program on 01/11/2022.  Patient started following Dr. Verdie Drown at [facility] recently who referred patient to [facility] program.  Subjective: Patient seen today via WebEx for follow-up in [facility] program. Pt reports that her mood is " depressed".  She reports that she is not happy with her current living situation and her parents have been constantly arguing with each other and making negative remarks to her. She reports that she has been benefiting from [facility] program and learning coping skills.  She has been sleeping and eating well.  Currently, she  denies any suicidal ideations, homicidal ideations, auditory and visual hallucinations.  She denies paranoia.  She denies any medication side effects and has been tolerating it well.  She has been tapering xanax by herself.  She reports that she was having bad withdrawal symptoms on Friday but now she feels much better.  She reports that previously she was taking 6 to 8 mg of Xanax every day but currently she takes about 1 mg twice daily.  Patient denies any need for change in medication and medication dosages and wants to continue same meds.  She denies any other concerns. Patient is alert and oriented x 4, dysphoric and anxious, cooperative, and fully engaged in conversation during the encounter.  Her thought process is linear with coherent speech . She does not appear to be responding to internal/external stimuli .     Visit Diagnosis:    ICD-10-CM   1. Major depressive disorder, recurrent severe without psychotic features ([place])  F33.2     2. PTSD (post-traumatic stress disorder)  F43.10     3. Severe benzodiazepine use disorder (HCC)  F13.20     4. Anxiety  F41.9       Past Psychiatric History: Previous Psych Diagnoses: MDD, PTSD, severe benzodiazepine use disorder, anxiety Prior inpatient treatment: Multiple, most recent admission earlier this year.  Her last hospitalization at [facility] was in August 2022. Current meds: BuSpar 15 mg twice daily, gabapentin 400 mg 3 times daily and Atarax 25 mg 3 times daily as needed for anxiety/sleep Psychotherapy hx: None Previous suicidal attempts: Multiple, most recent in 2018 due to overdose Previous medication  trials: BuSpar, Zoloft, Atarax, gabapentin, prazosin, trazodone, Haldol (side effects), Depakote, lithium, Seroquel, Remeron, Geodon, Thorazine Current therapist: None  Past Medical History:  Past Medical History:  Diagnosis Date   Anxiety    Benzodiazepine abuse (HCC)    Bipolar 1 disorder (HCC)    Depression    Drug abuse (HCC)    ETOH  abuse    Polysubstance abuse (HCC)    No past surgical history on file.  Family Psychiatric History: Psych:-Mom-drug abuse and anxiety Father-anxiety, drug abuse Maternal aunts-drug abuse Maternal grandfather-alcohol abuse Cousin-drug abuse Addiction and alcoholism in her whole family SA/HA: Mom tried to commit suicide  Family History:  Family History  Problem Relation Age of Onset   Drug abuse Mother    Anxiety disorder Mother    Anxiety disorder Father    Drug abuse Maternal Aunt    Alcohol abuse Maternal Grandfather    Drug abuse Cousin     Social History:  Social History   Socioeconomic History   Marital status: Single    Spouse name: Not on file   Number of children: 0   Years of education: Not on file   Highest education level: 11th grade  Occupational History   Not on file  Tobacco Use   Smoking status: Every Day    Packs/day: 1.50    Years: 17.00    Total pack years: 25.50    Types: Cigarettes   Smokeless tobacco: Never  Vaping Use   Vaping Use: Never used  Substance and Sexual Activity   Alcohol use: Yes    Comment: rarely-past etoh abuse   Drug use: Yes    Types: Marijuana, Benzodiazepines    Comment: Xanax; Klonopin   Sexual activity: Not Currently  Other Topics Concern   Not on file  Social History Narrative   Not on file   Social Determinants of Health   Financial Resource Strain: Not on file  Food Insecurity: Not on file  Transportation Needs: Not on file  Physical Activity: Not on file  Stress: Not on file  Social Connections: Not on file    Allergies:  Allergies  Allergen Reactions   Haldol [Haloperidol Lactate] Other (See Comments)    Makes whole body stiff    Metabolic Disorder Labs: Lab Results  Component Value Date   HGBA1C 5.2 07/12/2020   MPG 102.54 07/12/2020   No results found for: "PROLACTIN" Lab Results  Component Value Date   CHOL 123 07/12/2020   TRIG 56 07/12/2020   HDL 39 (L) 07/12/2020   CHOLHDL 3.2  07/12/2020   VLDL 11 07/12/2020   LDLCALC 73 07/12/2020   Lab Results  Component Value Date   TSH 0.906 07/12/2020   TSH 1.29 02/24/2016    Therapeutic Level Labs: No results found for: "LITHIUM" Lab Results  Component Value Date   VALPROATE 74.4 04/26/2010   VALPROATE 75.3 01/21/2010   Lab Results  Component Value Date   CBMZ <2.0 (L) 11/21/2016   CBMZ <0.5 (L) 10/31/2013    Current Medications: Current Outpatient Medications  Medication Sig Dispense Refill   albuterol (VENTOLIN HFA) 108 (90 Base) MCG/ACT inhaler Inhale 2 puffs into the lungs every 6 (six) hours as needed for wheezing or shortness of breath. 8.5 g 1   busPIRone (BUSPAR) 15 MG tablet Take 1 tablet (15 mg total) by mouth 2 (two) times daily. 60 tablet 1   fluticasone (FLONASE) 50 MCG/ACT nasal spray Place 1 spray into both nostrils daily. For allergies (Patient not  taking: Reported on 11/25/2020)  2   gabapentin (NEURONTIN) 400 MG capsule Take 1 capsule (400 mg total) by mouth 3 (three) times daily. 90 capsule 1   hydrOXYzine (ATARAX) 25 MG tablet Take 1 tablet (25 mg total) by mouth 3 (three) times daily as needed for anxiety (or sleep). 90 tablet 1   metoCLOPramide (REGLAN) 10 MG tablet Take 1 tablet (10 mg total) by mouth every 6 (six) hours. (Patient not taking: Reported on 01/11/2022) 30 tablet 0   omeprazole (PRILOSEC OTC) 20 MG tablet Take 20 mg by mouth daily.     prazosin (MINIPRESS) 2 MG capsule Take 1 capsule (2 mg total) by mouth at bedtime for nightmares (Patient not taking: Reported on 01/10/2022) 30 capsule 1   sertraline (ZOLOFT) 100 MG tablet Take 1 tablet (100 mg total) by mouth at bedtime. (Patient not taking: Reported on 01/10/2022) 30 tablet 1   traZODone (DESYREL) 100 MG tablet Take 0.5 tablets (50 mg total) by mouth at bedtime as needed for sleep. Patient to take take full tablet (100 mg total) if sleeping disturbances persist. (Patient not taking: Reported on 01/10/2022) 30 tablet 1   No current  facility-administered medications for this visit.     Musculoskeletal: Strength & Muscle Tone: not able to assess due to virtual visit Gait & Station: not able to assess due to virtual visit Patient leans: N/A  Psychiatric Specialty Exam: Review of Systems  There were no vitals taken for this visit.There is no height or weight on file to calculate BMI.  General Appearance: Casual  Eye Contact:  Fair  Speech:  Clear and Coherent and Normal Rate  Volume:  Normal  Mood:  Dysphoric  Affect:  Constricted  Thought Process:  Coherent and Linear  Orientation:  Full (Time, Place, and Person)  Thought Content: Logical   Suicidal Thoughts:  No  Homicidal Thoughts:  No  Memory:  Immediate;   Good Recent;   Good  Judgement:  Fair  Insight:  Fair  Psychomotor Activity:  Normal  Concentration:  Concentration: Fair and Attention Span: Fair  Recall:  Good  Fund of Knowledge: Fair  Language: Good  Akathisia:  No  Handed:  Right  AIMS (if indicated): not done  Assets:  Communication Skills Desire for Improvement Housing Resilience Vocational/Educational  ADL's:  Intact  Cognition: WNL  Sleep:  Good   Screenings: AIMS    Flowsheet Row [facility]) [facility]  AIMS Total Score 0 0      AUDIT    Flowsheet Row [facility]) [facility] Ad[facility] ([facility] Ad[facility] (Discharged[facility] Ad[facility] (Dis[facility]) [facility] ([facility]  Alcohol Use Disorder Identification Test Final Score (AUDIT) 2 2 22 29  33      GAD-7     Flowsheet Row Video Visit from 09/07/2021 in [facility] Video Visit from 03/18/2021 in [facility] Video Visit from 01/24/2021 in [facility] Counselor from 11/17/2020 in [facility] Office Visit from 10/08/2020 in [facility]  Total GAD-7 Score 19 18 19 20 19       [phone]  Flowsheet Row Counselor from 01/11/2022 in [facility] Counselor from 01/10/2022 in [facility] Video Visit from 09/07/2021 in [facility] Video Visit from 03/18/2021 in [facility] Video Visit from 01/24/2021 in [facility]  PHQ-2 Total Score 6 6 6 6 6   PHQ-9 Total Score 21 22 23 21 24       Flowsheet Row Counselor from 01/11/2022 in [facility] Counselor from 01/10/2022 in [facility] Video Visit from 09/07/2021 in [facility]  C-SSRS RISK CATEGORY High Risk Error: Q3, 4, or 5 should not be populated when Q2 is No Moderate Risk        Assessment and Plan: Patient is a 41 year old female with past psychiatric history of MDD, anxiety, PTSD, severe benzodiazepine use disorder, 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  Patient started PHP program on 01/11/2022.  Patient started following [facility]. [facility] at [facility] recently who referred patient to [facility] program. Patient has been tapering Xanax by herself.  She does not want to start any antipsychotics as she is afraid of long term side effects and wants to continue same medications.  MDD, severe, recurrent Anxiety with exacerbation due to substance withdrawal PTSD Benzodiazepine use disorder, currently withdrawing   -Continue PHP  program and group therapy -Continue outpatient medications and plan per Dr. [facility] 10/3 note -- Continue Buspar 15 mg BID -- Continue gabapentin  400 mg TID -- Continue Atarax 25 mg TID PRN anxiety/sleep -- Has not recently taking Prazosin or Zoloft; will revisit reinitiation of these medications in the future   Follow-up-next week   Collaboration of Care: Collaboration of Care: Other PHP team  Patient/Guardian was advised Release of Information must be obtained prior to any record release in order to collaborate their care with an outside provider. Patient/Guardian was advised if they have not already done so to contact the registration department to sign all necessary forms in order for [facility] to release information regarding their care.   Consent: Patient/Guardian gives verbal consent for treatment and assignment of benefits for services provided during this visit. Patient/Guardian expressed understanding and agreed to proceed.    Charna Busman, MD 01/17/2022, 11:35 AM

## 2022-01-19 ENCOUNTER — Ambulatory Visit (INDEPENDENT_AMBULATORY_CARE_PROVIDER_SITE_OTHER): Payer: No Payment, Other | Admitting: Licensed Clinical Social Worker

## 2022-01-19 DIAGNOSIS — F419 Anxiety disorder, unspecified: Secondary | ICD-10-CM

## 2022-01-19 DIAGNOSIS — F332 Major depressive disorder, recurrent severe without psychotic features: Secondary | ICD-10-CM | POA: Diagnosis not present

## 2022-01-23 ENCOUNTER — Ambulatory Visit (HOSPITAL_COMMUNITY): Payer: No Payment, Other

## 2022-01-24 ENCOUNTER — Ambulatory Visit (HOSPITAL_COMMUNITY): Payer: No Payment, Other

## 2022-01-24 ENCOUNTER — Encounter (HOSPITAL_COMMUNITY): Payer: Self-pay | Admitting: Psychiatry

## 2022-01-25 ENCOUNTER — Ambulatory Visit (HOSPITAL_COMMUNITY): Payer: No Payment, Other

## 2022-01-26 ENCOUNTER — Ambulatory Visit (HOSPITAL_COMMUNITY): Payer: No Payment, Other

## 2022-01-26 ENCOUNTER — Ambulatory Visit (INDEPENDENT_AMBULATORY_CARE_PROVIDER_SITE_OTHER): Payer: No Payment, Other | Admitting: Licensed Clinical Social Worker

## 2022-01-26 DIAGNOSIS — F1320 Sedative, hypnotic or anxiolytic dependence, uncomplicated: Secondary | ICD-10-CM

## 2022-01-26 NOTE — Progress Notes (Signed)
Virtual Visit via Video Note  I connected with [name] on 01/26/22 at  8:00 AM EDT by a video enabled telemedicine application and verified that I am speaking with the correct person using two identifiers.  Location: Patient: At work Provider: Virtual office   I discussed the limitations of evaluation and management by telemedicine and the availability of in person appointments. The patient expressed understanding and agreed to proceed.  History of Present Illness: [name] has past history of Xanax use and Depression. She reports that she still struggles with not taking any and reports that she last took one in September 2023.   Observations/Objective: [name] is referred to this Clinician for CD-IOP referral. [name] reports that she was attending PHP group but was unable to continue due to having to return back to work. She reports that she is currently a shift lead at [name] and has been residing in a camper that is parked in her parents' driveway. She reports that transportation is an issue for her and that it is difficult for her to miss work and attend IOP group in person. She reports that she is able to continue having individual sessions "as much as possible". She denies SI/HI, AVH or psychosis.  Assessment and Plan: Substance use and recent history is assessed. Barriers to treatment assessed. Assessed for issues of dangerousness and suicidality. Currently the plan is for [name] to continue to be seen for individual therapy bi-weekly.  Follow Up Instructions: Next session 11/02 at 4p.   I discussed the assessment and treatment plan with the patient. The patient was provided an opportunity to ask questions and all were answered. The patient agreed with the plan and demonstrated an understanding of the instructions.   The patient was advised to call back or seek an in-person evaluation if the symptoms worsen or if the condition fails to improve as anticipated.  I  provided 16 minutes of non-face-to-face time during this encounter.   Allyson Sabal, [facility]

## 2022-01-27 ENCOUNTER — Ambulatory Visit (HOSPITAL_COMMUNITY): Payer: No Payment, Other
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
## 2022-01-30 ENCOUNTER — Other Ambulatory Visit: Payer: Self-pay

## 2022-01-30 ENCOUNTER — Encounter (HOSPITAL_COMMUNITY): Payer: Self-pay | Admitting: Psychiatry

## 2022-01-30 ENCOUNTER — Telehealth (INDEPENDENT_AMBULATORY_CARE_PROVIDER_SITE_OTHER): Payer: No Payment, Other | Admitting: Psychiatry

## 2022-01-30 DIAGNOSIS — F332 Major depressive disorder, recurrent severe without psychotic features: Secondary | ICD-10-CM | POA: Diagnosis not present

## 2022-01-30 DIAGNOSIS — F1121 Opioid dependence, in remission: Secondary | ICD-10-CM | POA: Diagnosis not present

## 2022-01-30 DIAGNOSIS — F1320 Sedative, hypnotic or anxiolytic dependence, uncomplicated: Secondary | ICD-10-CM

## 2022-01-30 DIAGNOSIS — F419 Anxiety disorder, unspecified: Secondary | ICD-10-CM

## 2022-01-30 DIAGNOSIS — F4310 Post-traumatic stress disorder, unspecified: Secondary | ICD-10-CM

## 2022-01-30 MED ORDER — SERTRALINE HCL 50 MG PO TABS
ORAL_TABLET | ORAL | 1 refills | Status: DC
  Filled 2022-01-30 – 2022-01-31 (×2): qty 30, 30d supply, fill #0
  Filled 2022-02-22: qty 30, 30d supply, fill #1

## 2022-01-30 MED ORDER — BUSPIRONE HCL 15 MG PO TABS
15.0000 mg | ORAL_TABLET | Freq: Three times a day (TID) | ORAL | 1 refills | Status: DC
  Filled 2022-01-30 – 2022-02-22 (×2): qty 90, 30d supply, fill #0

## 2022-01-30 NOTE — Patient Instructions (Signed)
Thank you for attending your appointment today.  -- START Zoloft 25 mg daily for 1 week then increase to 50 mg daily thereafter. -- INCREASE Buspar to 15 mg three times daily.  -- Continue other medications as prescribed.  Please do not make any changes to medications without first discussing with your provider. If you are experiencing a psychiatric emergency, please call 911 or present to your nearest emergency department. Additional crisis, medication management, and therapy resources are included below.  [facility]  [address], [place], [place] [address] [phone] WALK-IN URGENT CARE 24/7 FOR ANYONE [address], [place], [place] Fax: [phone] [email or web address] *Interpreters available *Accepts all insurance and uninsured for Urgent Care needs *Accepts Medicaid and uninsured for outpatient treatment (below)      ONLY FOR [facility]  Below:    Outpatient New Patient Assessment/Therapy Walk-ins:        Monday -Thursday 8am until slots are full.        Every Friday 1pm-4pm  (first come, first served)                   New Patient Psychiatry/Medication Management        Monday-Friday 8am-11am (first come, first served)               For all walk-ins we ask that you arrive by 7:15am, because patients will be seen in the order of arrival.

## 2022-01-31 ENCOUNTER — Other Ambulatory Visit (HOSPITAL_COMMUNITY): Payer: Self-pay

## 2022-02-09 ENCOUNTER — Ambulatory Visit (HOSPITAL_COMMUNITY): Payer: 59 | Admitting: Licensed Clinical Social Worker

## 2022-02-22 ENCOUNTER — Other Ambulatory Visit (HOSPITAL_COMMUNITY): Payer: Self-pay

## 2022-02-27 ENCOUNTER — Ambulatory Visit (HOSPITAL_COMMUNITY): Payer: 59 | Admitting: Licensed Clinical Social Worker

## 2022-02-27 DIAGNOSIS — F4310 Post-traumatic stress disorder, unspecified: Secondary | ICD-10-CM

## 2022-02-27 NOTE — Progress Notes (Unsigned)
Client did not join session after link was sent and Clinician waited for client to join for 20 minutes.               Cheri Fowler, [facility]

## 2022-03-08 ENCOUNTER — Ambulatory Visit (HOSPITAL_COMMUNITY): Payer: 59 | Admitting: Licensed Clinical Social Worker

## 2022-03-27 ENCOUNTER — Telehealth (INDEPENDENT_AMBULATORY_CARE_PROVIDER_SITE_OTHER): Payer: No Payment, Other | Admitting: Psychiatry

## 2022-03-27 ENCOUNTER — Other Ambulatory Visit: Payer: Self-pay

## 2022-03-27 ENCOUNTER — Encounter (HOSPITAL_COMMUNITY): Payer: Self-pay | Admitting: Psychiatry

## 2022-03-27 DIAGNOSIS — F1320 Sedative, hypnotic or anxiolytic dependence, uncomplicated: Secondary | ICD-10-CM

## 2022-03-27 DIAGNOSIS — F3341 Major depressive disorder, recurrent, in partial remission: Secondary | ICD-10-CM

## 2022-03-27 DIAGNOSIS — F4310 Post-traumatic stress disorder, unspecified: Secondary | ICD-10-CM | POA: Diagnosis not present

## 2022-03-27 DIAGNOSIS — F332 Major depressive disorder, recurrent severe without psychotic features: Secondary | ICD-10-CM | POA: Diagnosis not present

## 2022-03-27 DIAGNOSIS — G479 Sleep disorder, unspecified: Secondary | ICD-10-CM

## 2022-03-27 DIAGNOSIS — F419 Anxiety disorder, unspecified: Secondary | ICD-10-CM

## 2022-03-27 MED ORDER — GABAPENTIN 400 MG PO CAPS
400.0000 mg | ORAL_CAPSULE | Freq: Three times a day (TID) | ORAL | 2 refills | Status: DC
  Filled 2022-03-27: qty 90, 30d supply, fill #0

## 2022-03-27 MED ORDER — BUSPIRONE HCL 15 MG PO TABS
15.0000 mg | ORAL_TABLET | Freq: Three times a day (TID) | ORAL | 2 refills | Status: DC
  Filled 2022-03-27: qty 90, 30d supply, fill #0

## 2022-03-27 MED ORDER — PRAZOSIN HCL 2 MG PO CAPS
2.0000 mg | ORAL_CAPSULE | Freq: Every day | ORAL | 2 refills | Status: DC
  Filled 2022-03-27: qty 30, 30d supply, fill #0

## 2022-03-27 MED ORDER — SERTRALINE HCL 50 MG PO TABS
50.0000 mg | ORAL_TABLET | Freq: Every day | ORAL | 2 refills | Status: DC
  Filled 2022-03-27: qty 30, 30d supply, fill #0
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
## 2022-03-27 NOTE — Psych (Signed)
Virtual Visit via Video Note  I connected with [name] on 01/19/22 at  9:55 AM EDT by a video enabled telemedicine application and verified that I am speaking with the correct person using two identifiers.  Location: Patient: patient home Provider: clinical home office   I discussed the limitations of evaluation and management by telemedicine and the availability of in person appointments. The patient expressed understanding and agreed to proceed.  I discussed the assessment and treatment plan with the patient. The patient was provided an opportunity to ask questions and all were answered. The patient agreed with the plan and demonstrated an understanding of the instructions.   The patient was advised to call back or seek an in-person evaluation if the symptoms worsen or if the condition fails to improve as anticipated.  Pt was provided 240 minutes of non-face-to-face time during this encounter.   Donia Guiles, LCSW   [facility] PHP THERAPIST PROGRESS NOTE  [name] [id number]  Session Time: 9:00 - 10:00  Participation Level: Active  Behavioral Response: CasualAlertDepressed  Type of Therapy: Group Therapy  Treatment Goals addressed: Coping  Progress Towards Goals: Progressing  Interventions: CBT, DBT, Supportive, and Reframing  Summary: [name] is a 41 y.o. female who presents with depression and anxiety symptoms.  Clinician led check-in regarding current stressors and situation, and review of patient completed daily inventory. Clinician utilized active listening and empathetic response and validated patient emotions. Clinician facilitated processing group on pertinent issues.?    Summary: Patient arrived within time allowed. Patient rates her mood at a 3.5 on a scale of 1-10 with 10 being best. Pt states she feels "stressed out." Pt reports she worked yesterday and it went well but she struggled with concentration. Pt reports appetite  is increasing and her sleep remains poor. Pt reports struggle with poor support.  Pt able to process.?Pt engaged in discussion.?          Session Time: 10:00 am - 11:00 am   Participation Level: Active   Behavioral Response: CasualAlertAnxious and Depressed   Type of Therapy: Group Therapy   Treatment Goals addressed: Coping   Progress Towards Goals: Progressing   Interventions: CBT, DBT, Solution Focused, Strength-based, Supportive, and Reframing   Therapist Response: Cln led discussion on personal standards and they way in which it impacts the way we view ourselves and our abilities. Group members discussed judgment, struggles, and barriers they experience in terms of personal standards. Cln brought in topics of balance, grace, and kindness. Cln proposed the "best friend test" as a way to calibrate whether we are viewing our situation with kindness or harshness.   Summary: Pt engaged in discussion and reports willingness to utilize the best friend test.          Session Time: 11:00 am - 12:00 pm   Participation Level: Active   Behavioral Response: CasualAlertAnxious and Depressed   Type of Therapy: Group Therapy   Treatment Goals addressed: Coping   Progress Towards Goals: Progressing   Interventions: CBT, DBT, Solution Focused, Strength-based, Supportive, and Reframing   Therapist Response: Cln introduced DBT emotion regulation skill, Opposite Action. Cln discussed how imagining the opposite reaction and acting in that manner can help keep [place] regulated and limit negative consequences. Group discussed ways they struggle with managing reactions.    Summary: Pt engaged in discussion and is able to identify ways to utilize opposite action.            Session Time: 12:00  pm - 1:00 pm   Participation Level: Active   Behavioral Response: CasualAlertAnxious and Depressed   Type of Therapy: Group Therapy   Treatment Goals addressed: Coping   Progress Towards Goals:  Progressing   Interventions: CBT, DBT, Solution Focused, Strength-based, Supportive, and Reframing   Therapist Response: 12:00 - 12:50 pm: See OT note.  12:50 - 1:00 pm: Clinician led check-out. Clinician assessed for immediate needs, medication compliance and efficacy, and safety concerns?   Summary: 12:00 - 12:50 pm: Pt participated 12:50 - 1:00 pm: At check-out, patient contracts for safety.?Patient demonstrates progress as evidenced by continued engagement in group and responsiveness to treatment. Patient denies SI/HI/self-harm thoughts at the end of group and agrees to seek help should those thoughts/feelings occur.?     Suicidal/Homicidal: Nowithout intent/plan  Plan: Pt will continue in PHP while working to decrease depression and anxiety symptoms, stabilize while tapering off Xanax, and increase ability to manage symptoms in a healthy manner.   Collaboration of Care: Medication Management AEB V Doda  Patient/Guardian was advised Release of Information must be obtained prior to any record release in order to collaborate their care with an outside provider. Patient/Guardian was advised if they have not already done so to contact the registration department to sign all necessary forms in order for [place] to release information regarding their care.   Consent: Patient/Guardian gives verbal consent for treatment and assignment of benefits for services provided during this visit. Patient/Guardian expressed understanding and agreed to proceed.   Diagnosis: Major depressive disorder, recurrent severe without psychotic features (HCC) [F33.2]    1. Major depressive disorder, recurrent severe without psychotic features (HCC)   2. Anxiety      Donia Guiles, LCSW

## 2022-03-27 NOTE — Psych (Signed)
Virtual Visit via Video Note  I connected with [name] on 01/12/22 at  9:00 AM EDT by a video enabled telemedicine application and verified that I am speaking with the correct person using two identifiers.  Location: Patient: patient home Provider: clinical home office   I discussed the limitations of evaluation and management by telemedicine and the availability of in person appointments. The patient expressed understanding and agreed to proceed.  I discussed the assessment and treatment plan with the patient. The patient was provided an opportunity to ask questions and all were answered. The patient agreed with the plan and demonstrated an understanding of the instructions.   The patient was advised to call back or seek an in-person evaluation if the symptoms worsen or if the condition fails to improve as anticipated.  Pt was provided 240 minutes of non-face-to-face time during this encounter.   Donia Guiles, LCSW   [facility] PHP THERAPIST PROGRESS NOTE  [name] [id number]  Session Time: 9:00 - 10:00  Participation Level: Active  Behavioral Response: CasualAlertDepressed  Type of Therapy: Group Therapy  Treatment Goals addressed: Coping  Progress Towards Goals: Initial  Interventions: CBT, DBT, Supportive, and Reframing  Summary: [name] is a 41 y.o. female who presents with depression and anxiety symptoms.  Clinician led check-in regarding current stressors and situation, and review of patient completed daily inventory. Clinician utilized active listening and empathetic response and validated patient emotions. Clinician facilitated processing group on pertinent issues.?    Summary: Patient arrived within time allowed. Patient rates her mood at a 4 on a scale of 1-10 with 10 being best. Pt states she feels worse physically and her anxiety is "manageable." Pt reports having flu-like symptoms this morning and yesterday. Pt reports she  took 2 0.25 mg doses of Xanax yesterday and is taking less each day. Pt states she reached out to someone who she used to attend AA with and they offered her numbers for rides and a possible sponsor. Pt states feeling hopeful. Pt reports sleeping 6 hours and eating 3x.  Pt able to process.?Pt engaged in discussion.?          Session Time: 10:00 am - 11:00 am   Participation Level: Active   Behavioral Response: CasualAlertAnxious and Depressed   Type of Therapy: Group Therapy   Treatment Goals addressed: Coping   Progress Towards Goals: Progressing   Interventions: CBT, DBT, Solution Focused, Strength-based, Supportive, and Reframing   Therapist Response: Cln led processing group for pt's current struggles. Group members shared stressors and provided support and feedback. Cln brought in topics of boundaries, healthy relationships, and unhealthy thought processes to inform discussion.    Summary: Pt able to process and provide support to group.          Session Time: 11:00 am - 12:00 pm   Participation Level: Active   Behavioral Response: CasualAlertAnxious and Depressed   Type of Therapy: Group Therapy   Treatment Goals addressed: Coping   Progress Towards Goals: Progressing   Interventions: CBT, DBT, Solution Focused, Strength-based, Supportive, and Reframing   Therapist Response: Cln led discussion on reframing and applying CBT thought challenging when stuck with persistent, negative thoughts. Group viewed TED talk "Choice, happiness, and spaghetti sauce" to aid discussion." Group discussed how they could reframe a current negative thought pattern.    Summary: Pt engaged in discussion and reports understanding of reframing.         Session Time: 12:00 pm - 1:00 pm  Participation Level: Active   Behavioral Response: CasualAlertAnxious and Depressed   Type of Therapy: Group Therapy   Treatment Goals addressed: Coping   Progress Towards Goals: Progressing    Interventions: CBT, DBT, Solution Focused, Strength-based, Supportive, and Reframing   Therapist Response: 12:00 - 12:50 pm: See OT note.  12:50 - 1:00 pm: Clinician led check-out. Clinician assessed for immediate needs, medication compliance and efficacy, and safety concerns?   Summary: 12:00 - 12:50 pm: Pt participated 12:50 - 1:00 pm: At check-out, patient contracts for safety.?Patient demonstrates progress as evidenced by increasing appetite. Patient denies SI/HI/self-harm thoughts at the end of group and agrees to seek help should those thoughts/feelings occur.?     Suicidal/Homicidal: Nowithout intent/plan  Plan: Pt will continue in PHP while working to decrease depression and anxiety symptoms, stabilize while tapering off Xanax, and increase ability to manage symptoms in a healthy manner.   Collaboration of Care: Medication Management AEB V Doda  Patient/Guardian was advised Release of Information must be obtained prior to any record release in order to collaborate their care with an outside provider. Patient/Guardian was advised if they have not already done so to contact the registration department to sign all necessary forms in order for [place] to release information regarding their care.   Consent: Patient/Guardian gives verbal consent for treatment and assignment of benefits for services provided during this visit. Patient/Guardian expressed understanding and agreed to proceed.   Diagnosis: Major depressive disorder, recurrent severe without psychotic features (HCC) [F33.2]    1. Major depressive disorder, recurrent severe without psychotic features (HCC)   2. PTSD (post-traumatic stress disorder)   3. Severe benzodiazepine use disorder (HCC)   4. Anxiety      Donia Guiles, LCSW

## 2022-03-27 NOTE — Psych (Signed)
Virtual Visit via Video Note  I connected with [name] on 01/16/22 at  9:00 AM EDT by a video enabled telemedicine application and verified that I am speaking with the correct person using two identifiers.  Location: Patient: patient home Provider: clinical home office   I discussed the limitations of evaluation and management by telemedicine and the availability of in person appointments. The patient expressed understanding and agreed to proceed.  I discussed the assessment and treatment plan with the patient. The patient was provided an opportunity to ask questions and all were answered. The patient agreed with the plan and demonstrated an understanding of the instructions.   The patient was advised to call back or seek an in-person evaluation if the symptoms worsen or if the condition fails to improve as anticipated.  Pt was provided 240 minutes of non-face-to-face time during this encounter.   Donia Guiles, LCSW   [facility] PHP THERAPIST PROGRESS NOTE  [name] [id number]  Session Time: 9:00 - 10:00  Participation Level: Active  Behavioral Response: CasualAlertDepressed  Type of Therapy: Group Therapy  Treatment Goals addressed: Coping  Progress Towards Goals: Progressing  Interventions: CBT, DBT, Supportive, and Reframing  Summary: [name] is a 41 y.o. female who presents with depression and anxiety symptoms.  Clinician led check-in regarding current stressors and situation, and review of patient completed daily inventory. Clinician utilized active listening and empathetic response and validated patient emotions. Clinician facilitated processing group on pertinent issues.?    Summary: Patient arrived within time allowed. Patient rates her mood at a 3.5 on a scale of 1-10 with 10 being best. Pt states she feels "stressed out." Pt states she has a court date today and her lawyer is there representing her. Pt reports high anxiety  waiting to hear what happened. Pt reports she is noticing progress in her withdrawal symptoms and they were bad on Friday but less so the rest of the weekend. Pt able to process.?Pt engaged in discussion.?          Session Time: 10:00 am - 11:00 am   Participation Level: Active   Behavioral Response: CasualAlertAnxious and Depressed   Type of Therapy: Group Therapy   Treatment Goals addressed: Coping   Progress Towards Goals: Progressing   Interventions: CBT, DBT, Solution Focused, Strength-based, Supportive, and Reframing   Therapist Response: Cln led processing group for pt's current struggles. Group members shared stressors and provided support and feedback. Cln brought in topics of boundaries, healthy relationships, and unhealthy thought processes to inform discussion.     Summary: Pt able to process and provide support to group.          Session Time: 11:00 am - 12:00 pm   Participation Level: Active   Behavioral Response: CasualAlertAnxious and Depressed   Type of Therapy: Group Therapy   Treatment Goals addressed: Coping   Progress Towards Goals: Progressing   Interventions: CBT, DBT, Solution Focused, Strength-based, Supportive, and Reframing   Therapist Response: Cln led discussion on DBT dialectics and balance. Cln encouraged pt's to utilize AND statements to cue their brain to hold two opposing ideas. Cln provided examples and group members created their own AND statements.   Summary:  Pt engaged in discussion and created AND statement around recognzing progress.            Session Time: 12:00 pm - 1:00 pm   Participation Level: Active   Behavioral Response: CasualAlertAnxious and Depressed   Type of Therapy: Group Therapy  Treatment Goals addressed: Coping   Progress Towards Goals: Progressing   Interventions: CBT, DBT, Solution Focused, Strength-based, Supportive, and Reframing   Therapist Response: 12:00 - 12:50 pm: See OT note.  12:50 -  1:00 pm: Clinician led check-out. Clinician assessed for immediate needs, medication compliance and efficacy, and safety concerns?   Summary: 12:00 - 12:50 pm: Pt participated 12:50 - 1:00 pm: At check-out, patient contracts for safety.?Patient demonstrates progress as evidenced by continued engagement in group and responsiveness to treatment. Patient denies SI/HI/self-harm thoughts at the end of group and agrees to seek help should those thoughts/feelings occur.?     Suicidal/Homicidal: Nowithout intent/plan  Plan: Pt will continue in PHP while working to decrease depression and anxiety symptoms, stabilize while tapering off Xanax, and increase ability to manage symptoms in a healthy manner.   Collaboration of Care: Medication Management AEB V Doda  Patient/Guardian was advised Release of Information must be obtained prior to any record release in order to collaborate their care with an outside provider. Patient/Guardian was advised if they have not already done so to contact the registration department to sign all necessary forms in order for [place] to release information regarding their care.   Consent: Patient/Guardian gives verbal consent for treatment and assignment of benefits for services provided during this visit. Patient/Guardian expressed understanding and agreed to proceed.   Diagnosis: Major depressive disorder, recurrent severe without psychotic features (HCC) [F33.2]    1. Major depressive disorder, recurrent severe without psychotic features (HCC)   2. Anxiety      Donia Guiles, LCSW

## 2022-03-27 NOTE — Psych (Signed)
Virtual Visit via Video Note  I connected with [name] on 01/11/22 at  9:00 AM EDT by a video enabled telemedicine application and verified that I am speaking with the correct person using two identifiers.  Location: Patient: patient home Provider: clinical home office   I discussed the limitations of evaluation and management by telemedicine and the availability of in person appointments. The patient expressed understanding and agreed to proceed.  I discussed the assessment and treatment plan with the patient. The patient was provided an opportunity to ask questions and all were answered. The patient agreed with the plan and demonstrated an understanding of the instructions.   The patient was advised to call back or seek an in-person evaluation if the symptoms worsen or if the condition fails to improve as anticipated.  Pt was provided 240 minutes of non-face-to-face time during this encounter.   Donia Guiles, LCSW   [facility] PHP THERAPIST PROGRESS NOTE  Terry Abila [id number]  Session Time: 9:00 - 10:00  Participation Level: Active  Behavioral Response: CasualAlertDepressed  Type of Therapy: Group Therapy  Treatment Goals addressed: Coping  Progress Towards Goals: Initial  Interventions: CBT, DBT, Supportive, and Reframing  Summary: Yaniris Braddock is a 41 y.o. female who presents with depression and anxiety symptoms.  Clinician led check-in regarding current stressors and situation, and review of patient completed daily inventory. Clinician utilized active listening and empathetic response and validated patient emotions. Clinician facilitated processing group on pertinent issues.?    Summary: Patient arrived within time allowed. Patient rates her mood at a 4.5 on a scale of 1-10 with 10 being best. Pt states she feels "low."  Pt reports she is tapering off Xanax and is suffering from withdrawals of pain, distractions, and anxiety. Pt states it  was bad yesterday and she stayed in bed. Pt reports decreased appetite and sleeping 4 hours. Pt struggled with coping skills. Pt able to process.?Pt engaged in discussion.?          Session Time: 10:00 am - 11:00 am   Participation Level: Active   Behavioral Response: CasualAlertAnxious and Depressed   Type of Therapy: Group Therapy   Treatment Goals addressed: Coping   Progress Towards Goals: Progressing   Interventions: CBT, DBT, Solution Focused, Strength-based, Supportive, and Reframing   Therapist Response: Cln led processing group for pt's current struggles. Group members shared stressors and provided support and feedback. Cln brought in topics of boundaries, healthy relationships, and unhealthy thought processes to inform discussion.    Summary:  Pt able to process and provide support to group.          Session Time: 11:00 am - 12:00 pm   Participation Level: Active   Behavioral Response: CasualAlertAnxious and Depressed   Type of Therapy: Group Therapy, Spiritual Care   Treatment Goals addressed: Coping   Progress Towards Goals: Progressing   Interventions: Supportive, Education   Therapist Response: Laurell Josephs, Chaplain, led group.   Summary: Pt engaged in discussion.         Session Time: 12:00 pm - 1:00 pm   Participation Level: Active   Behavioral Response: CasualAlertAnxious and Depressed   Type of Therapy: Group Therapy   Treatment Goals addressed: Coping   Progress Towards Goals: Progressing   Interventions: CBT, DBT, Solution Focused, Strength-based, Supportive, and Reframing   Therapist Response: 12:00 - 12:50 pm: See OT note.  12:50 - 1:00 pm: Clinician led check-out. Clinician assessed for immediate needs, medication compliance and efficacy, and  safety concerns?   Summary: 12:00 - 12:50 pm: Pt participated 12:50 - 1:00 pm: At check-out, patient contracts for safety.?Patient demonstrates progress as evidenced by participating in  first group session. Patient denies SI/HI/self-harm thoughts at the end of group and agrees to seek help should those thoughts/feelings occur.?     Suicidal/Homicidal: Nowithout intent/plan  Plan: Pt will continue in PHP while working to decrease depression and anxiety symptoms, stabilize while tapering off Xanax, and increase ability to manage symptoms in a healthy manner.   Collaboration of Care: Medication Management AEB V Doda  Patient/Guardian was advised Release of Information must be obtained prior to any record release in order to collaborate their care with an outside provider. Patient/Guardian was advised if they have not already done so to contact the registration department to sign all necessary forms in order for [place] to release information regarding their care.   Consent: Patient/Guardian gives verbal consent for treatment and assignment of benefits for services provided during this visit. Patient/Guardian expressed understanding and agreed to proceed.   Diagnosis: Major depressive disorder, recurrent severe without psychotic features (HCC) [F33.2]    1. Major depressive disorder, recurrent severe without psychotic features (HCC)   2. Anxiety      Donia Guiles, LCSW

## 2022-03-27 NOTE — Patient Instructions (Signed)
Thank you for attending your appointment today.  -- INCREASE gabapentin to 400 mg three times daily as prescribed -- Continue other medications as prescribed. -- Minimize and abstain from substance use as this worsens both your mental and physical health  Please do not make any changes to medications without first discussing with your provider. If you are experiencing a psychiatric emergency, please call 911 or present to your nearest emergency department. Additional crisis, medication management, and therapy resources are included below.  [facility]  [address], [place], [place] [address] [phone] WALK-IN URGENT CARE 24/7 FOR ANYONE [address], [place], [place]  [phone] Fax: [phone] [email or web address] *Interpreters available *Accepts all insurance and uninsured for Urgent Care needs *Accepts Medicaid and uninsured for outpatient treatment (below)      ONLY FOR [facility]  Below:    Outpatient New Patient Assessment/Therapy Walk-ins:        Monday -Thursday 8am until slots are full.        Every Friday 1pm-4pm  (first come, first served)                   New Patient Psychiatry/Medication Management        Monday-Friday 8am-11am (first come, first served)               For all walk-ins we ask that you arrive by 7:15am, because patients will be seen in the order of arrival.

## 2022-03-28 ENCOUNTER — Other Ambulatory Visit: Payer: Self-pay

## 2022-04-26 ENCOUNTER — Ambulatory Visit (HOSPITAL_COMMUNITY): Payer: 59 | Admitting: Mental Health

## 2022-05-17 ENCOUNTER — Encounter (HOSPITAL_COMMUNITY): Payer: No Payment, Other | Admitting: Student
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
## 2022-05-18 ENCOUNTER — Telehealth (INDEPENDENT_AMBULATORY_CARE_PROVIDER_SITE_OTHER): Payer: No Payment, Other | Admitting: Physician Assistant

## 2022-05-18 ENCOUNTER — Encounter (HOSPITAL_COMMUNITY): Payer: Self-pay | Admitting: Physician Assistant

## 2022-05-18 ENCOUNTER — Other Ambulatory Visit: Payer: Self-pay

## 2022-05-18 DIAGNOSIS — G479 Sleep disorder, unspecified: Secondary | ICD-10-CM

## 2022-05-18 DIAGNOSIS — F4310 Post-traumatic stress disorder, unspecified: Secondary | ICD-10-CM | POA: Diagnosis not present

## 2022-05-18 DIAGNOSIS — F3341 Major depressive disorder, recurrent, in partial remission: Secondary | ICD-10-CM

## 2022-05-18 DIAGNOSIS — F332 Major depressive disorder, recurrent severe without psychotic features: Secondary | ICD-10-CM | POA: Diagnosis not present

## 2022-05-18 DIAGNOSIS — F419 Anxiety disorder, unspecified: Secondary | ICD-10-CM | POA: Diagnosis not present

## 2022-05-18 MED ORDER — SERTRALINE HCL 100 MG PO TABS
100.0000 mg | ORAL_TABLET | Freq: Every day | ORAL | 2 refills | Status: DC
  Filled 2022-05-18 – 2022-05-31 (×2): qty 30, 30d supply, fill #0

## 2022-05-18 MED ORDER — TRAZODONE HCL 100 MG PO TABS
50.0000 mg | ORAL_TABLET | Freq: Every evening | ORAL | 1 refills | Status: DC | PRN
  Filled 2022-05-18: qty 30, 60d supply, fill #0

## 2022-05-18 MED ORDER — PRAZOSIN HCL 2 MG PO CAPS
2.0000 mg | ORAL_CAPSULE | Freq: Every day | ORAL | 2 refills | Status: DC
  Filled 2022-05-18 – 2022-06-14 (×4): qty 30, 30d supply, fill #0

## 2022-05-18 MED ORDER — BUSPIRONE HCL 15 MG PO TABS
15.0000 mg | ORAL_TABLET | Freq: Three times a day (TID) | ORAL | 2 refills | Status: DC
  Filled 2022-05-18 – 2022-06-14 (×4): qty 90, 30d supply, fill #0

## 2022-05-18 MED ORDER — GABAPENTIN 400 MG PO CAPS
400.0000 mg | ORAL_CAPSULE | Freq: Three times a day (TID) | ORAL | 2 refills | Status: DC
Start: 2022-05-18 — End: 2022-06-27
  Filled 2022-05-18 – 2022-06-14 (×4): qty 90, 30d supply, fill #0
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
## 2022-05-31 ENCOUNTER — Other Ambulatory Visit: Payer: Self-pay

## 2022-05-31 ENCOUNTER — Other Ambulatory Visit (HOSPITAL_COMMUNITY): Payer: Self-pay

## 2022-06-01 ENCOUNTER — Other Ambulatory Visit (HOSPITAL_COMMUNITY): Payer: Self-pay

## 2022-06-01 ENCOUNTER — Encounter (HOSPITAL_COMMUNITY): Payer: Self-pay

## 2022-06-06 ENCOUNTER — Other Ambulatory Visit: Payer: Self-pay

## 2022-06-13 ENCOUNTER — Other Ambulatory Visit: Payer: Self-pay

## 2022-06-13 ENCOUNTER — Encounter (HOSPITAL_COMMUNITY): Payer: Self-pay

## 2022-06-13 ENCOUNTER — Other Ambulatory Visit (HOSPITAL_COMMUNITY): Payer: Self-pay

## 2022-06-15 ENCOUNTER — Other Ambulatory Visit: Payer: Self-pay

## 2022-06-15 ENCOUNTER — Other Ambulatory Visit (HOSPITAL_COMMUNITY): Payer: Self-pay

## 2022-06-16 ENCOUNTER — Other Ambulatory Visit: Payer: Self-pay

## 2022-06-20 ENCOUNTER — Other Ambulatory Visit: Payer: Self-pay

## 2022-06-27 ENCOUNTER — Encounter: Payer: Self-pay | Admitting: Psychiatry

## 2022-06-27 ENCOUNTER — Inpatient Hospital Stay
Admission: AD | Admit: 2022-06-27 | Discharge: 2022-06-30 | DRG: 883 | Disposition: A | Discharge status: Home / Self Care | Payer: 59 | Source: Other Acute Inpatient Hospital | Attending: Psychiatry | Admitting: Psychiatry

## 2022-06-27 ENCOUNTER — Other Ambulatory Visit: Payer: Self-pay

## 2022-06-27 ENCOUNTER — Ambulatory Visit (HOSPITAL_COMMUNITY)
Admission: EM | Admit: 2022-06-27 | Discharge: 2022-06-27 | Disposition: A | Discharge status: Other Acute Inpatient Hospital | Payer: No Payment, Other | Attending: Nurse Practitioner | Admitting: Nurse Practitioner

## 2022-06-27 DIAGNOSIS — Z9151 Personal history of suicidal behavior: Secondary | ICD-10-CM | POA: Diagnosis not present

## 2022-06-27 DIAGNOSIS — F319 Bipolar disorder, unspecified: Secondary | ICD-10-CM | POA: Diagnosis present

## 2022-06-27 DIAGNOSIS — R45851 Suicidal ideations: Secondary | ICD-10-CM | POA: Diagnosis present

## 2022-06-27 DIAGNOSIS — Z813 Family history of other psychoactive substance abuse and dependence: Secondary | ICD-10-CM | POA: Diagnosis not present

## 2022-06-27 DIAGNOSIS — R4689 Other symptoms and signs involving appearance and behavior: Secondary | ICD-10-CM | POA: Diagnosis not present

## 2022-06-27 DIAGNOSIS — Z91148 Patient's other noncompliance with medication regimen for other reason: Secondary | ICD-10-CM | POA: Insufficient documentation

## 2022-06-27 DIAGNOSIS — Z888 Allergy status to other drugs, medicaments and biological substances status: Secondary | ICD-10-CM

## 2022-06-27 DIAGNOSIS — R059 Cough, unspecified: Secondary | ICD-10-CM

## 2022-06-27 DIAGNOSIS — F1324 Sedative, hypnotic or anxiolytic dependence with sedative, hypnotic or anxiolytic-induced mood disorder: Secondary | ICD-10-CM | POA: Diagnosis present

## 2022-06-27 DIAGNOSIS — Z046 Encounter for general psychiatric examination, requested by authority: Secondary | ICD-10-CM

## 2022-06-27 DIAGNOSIS — F6381 Intermittent explosive disorder: Secondary | ICD-10-CM

## 2022-06-27 DIAGNOSIS — F1994 Other psychoactive substance use, unspecified with psychoactive substance-induced mood disorder: Secondary | ICD-10-CM | POA: Diagnosis present

## 2022-06-27 DIAGNOSIS — F603 Borderline personality disorder: Secondary | ICD-10-CM | POA: Diagnosis present

## 2022-06-27 DIAGNOSIS — Z1152 Encounter for screening for COVID-19: Secondary | ICD-10-CM | POA: Insufficient documentation

## 2022-06-27 DIAGNOSIS — F1320 Sedative, hypnotic or anxiolytic dependence, uncomplicated: Secondary | ICD-10-CM | POA: Diagnosis present

## 2022-06-27 DIAGNOSIS — F4310 Post-traumatic stress disorder, unspecified: Secondary | ICD-10-CM | POA: Diagnosis present

## 2022-06-27 DIAGNOSIS — F1090 Alcohol use, unspecified, uncomplicated: Secondary | ICD-10-CM | POA: Diagnosis present

## 2022-06-27 DIAGNOSIS — F1314 Sedative, hypnotic or anxiolytic abuse with sedative, hypnotic or anxiolytic-induced mood disorder: Secondary | ICD-10-CM | POA: Insufficient documentation

## 2022-06-27 DIAGNOSIS — R451 Restlessness and agitation: Secondary | ICD-10-CM | POA: Insufficient documentation

## 2022-06-27 DIAGNOSIS — Z79899 Other long term (current) drug therapy: Secondary | ICD-10-CM | POA: Insufficient documentation

## 2022-06-27 DIAGNOSIS — F17210 Nicotine dependence, cigarettes, uncomplicated: Secondary | ICD-10-CM | POA: Diagnosis present

## 2022-06-27 DIAGNOSIS — Z818 Family history of other mental and behavioral disorders: Secondary | ICD-10-CM | POA: Diagnosis not present

## 2022-06-27 DIAGNOSIS — F1310 Sedative, hypnotic or anxiolytic abuse, uncomplicated: Secondary | ICD-10-CM

## 2022-06-27 LAB — HEMOGLOBIN A1C
Hgb A1c MFr Bld: 5.2 % (ref 4.8–5.6)
Mean Plasma Glucose: 103 mg/dL

## 2022-06-27 LAB — COMPREHENSIVE METABOLIC PANEL
ALT: 16 U/L (ref 0–44)
AST: 20 U/L (ref 15–41)
Albumin: 4.2 g/dL (ref 3.5–5.0)
Alkaline Phosphatase: 36 U/L — ABNORMAL LOW (ref 38–126)
Anion gap: 11 (ref 5–15)
BUN: 8 mg/dL (ref 6–20)
CO2: 22 mmol/L (ref 22–32)
Calcium: 8.8 mg/dL — ABNORMAL LOW (ref 8.9–10.3)
Chloride: 109 mmol/L (ref 98–111)
Creatinine, Ser: 0.57 mg/dL (ref 0.44–1.00)
GFR, Estimated: >60 mL/min (ref >60)
Glucose, Bld: 82 mg/dL (ref 70–99)
Potassium: 3.4 mmol/L — ABNORMAL LOW (ref 3.5–5.1)
Sodium: 142 mmol/L (ref 135–145)
Total Bilirubin: 0.6 mg/dL (ref 0.3–1.2)
Total Protein: 6.6 g/dL (ref 6.5–8.1)

## 2022-06-27 LAB — LIPID PANEL
Cholesterol: 158 mg/dL (ref 0–200)
HDL: 59 mg/dL (ref >40)
LDL Cholesterol: 84 mg/dL (ref 0–99)
Total CHOL/HDL Ratio: 2.7 RATIO
Triglycerides: 77 mg/dL (ref <150)
VLDL: 15 mg/dL (ref 0–40)

## 2022-06-27 LAB — RESP PANEL BY RT-PCR (RSV, FLU A&B, COVID)  RVPGX2
Influenza A by PCR: NEGATIVE
Influenza B by PCR: NEGATIVE
Resp Syncytial Virus by PCR: NEGATIVE
SARS Coronavirus 2 by RT PCR: NEGATIVE

## 2022-06-27 LAB — POC URINE PREG, ED: Preg Test, Ur: NEGATIVE

## 2022-06-27 LAB — CBC WITH DIFFERENTIAL/PLATELET
Abs Immature Granulocytes: 0.01 10*3/uL (ref 0.00–0.07)
Basophils Absolute: 0.1 10*3/uL (ref 0.0–0.1)
Basophils Relative: 1 %
Eosinophils Absolute: 0.1 10*3/uL (ref 0.0–0.5)
Eosinophils Relative: 1 %
HCT: 39.3 % (ref 36.0–46.0)
Hemoglobin: 13.4 g/dL (ref 12.0–15.0)
Immature Granulocytes: 0 %
Lymphocytes Relative: 33 %
Lymphs Abs: 1.8 10*3/uL (ref 0.7–4.0)
MCH: 34 pg (ref 26.0–34.0)
MCHC: 34.1 g/dL (ref 30.0–36.0)
MCV: 99.7 fL (ref 80.0–100.0)
Monocytes Absolute: 0.5 10*3/uL (ref 0.1–1.0)
Monocytes Relative: 8 %
Neutro Abs: 3.2 10*3/uL (ref 1.7–7.7)
Neutrophils Relative %: 57 %
Platelets: 272 10*3/uL (ref 150–400)
RBC: 3.94 MIL/uL (ref 3.87–5.11)
RDW: 13.5 % (ref 11.5–15.5)
WBC: 5.6 10*3/uL (ref 4.0–10.5)
nRBC: 0 % (ref 0.0–0.2)

## 2022-06-27 LAB — TSH: TSH: 2.468 u[IU]/mL (ref 0.350–4.500)

## 2022-06-27 LAB — POCT URINE DRUG SCREEN - MANUAL ENTRY (I-SCREEN)
POC Amphetamine UR: NOT DETECTED
POC Buprenorphine (BUP): NOT DETECTED
POC Cocaine UR: NOT DETECTED
POC Marijuana UR: POSITIVE — AB
POC Methadone UR: NOT DETECTED
POC Methamphetamine UR: NOT DETECTED
POC Morphine: NOT DETECTED
POC Oxazepam (BZO): POSITIVE — AB
POC Oxycodone UR: NOT DETECTED
POC Secobarbital (BAR): NOT DETECTED

## 2022-06-27 LAB — ETHANOL: Alcohol, Ethyl (B): 156 mg/dL — ABNORMAL HIGH (ref <10)

## 2022-06-27 LAB — POC SARS CORONAVIRUS 2 AG: SARSCOV2ONAVIRUS 2 AG: NEGATIVE

## 2022-06-27 MED ORDER — LORAZEPAM 2 MG/ML IJ SOLN
1.0000 mg | Freq: Once | INTRAMUSCULAR | Status: AC
  Administered 2022-06-27: 1 mg via INTRAMUSCULAR
  Filled 2022-06-27: qty 1

## 2022-06-27 MED ORDER — LORAZEPAM 1 MG PO TABS: 1.0000 mg | ORAL_TABLET | Freq: Two times a day (BID) | ORAL | Status: DC

## 2022-06-27 MED ORDER — ACETAMINOPHEN 325 MG PO TABS
650.0000 mg | ORAL_TABLET | Freq: Four times a day (QID) | ORAL | Status: DC | PRN
  Administered 2022-06-27: 650 mg via ORAL
  Filled 2022-06-27: qty 2

## 2022-06-27 MED ORDER — LORAZEPAM 1 MG PO TABS: 1.0000 mg | ORAL_TABLET | Freq: Three times a day (TID) | ORAL | Status: DC

## 2022-06-27 MED ORDER — OLANZAPINE 5 MG PO TBDP: 5.0000 mg | ORAL_TABLET | Freq: Two times a day (BID) | ORAL | Status: DC

## 2022-06-27 MED ORDER — GABAPENTIN 100 MG PO CAPS
200.0000 mg | ORAL_CAPSULE | Freq: Three times a day (TID) | ORAL | Status: DC
  Administered 2022-06-27: 200 mg via ORAL
  Filled 2022-06-27: qty 2

## 2022-06-27 MED ORDER — HYDROXYZINE HCL 25 MG PO TABS
25.0000 mg | ORAL_TABLET | Freq: Four times a day (QID) | ORAL | Status: DC | PRN
  Administered 2022-06-27: 25 mg via ORAL
  Filled 2022-06-27: qty 1

## 2022-06-27 MED ORDER — OLANZAPINE 5 MG PO TBDP
5.0000 mg | ORAL_TABLET | Freq: Three times a day (TID) | ORAL | Status: DC | PRN
  Filled 2022-06-27: qty 1

## 2022-06-27 MED ORDER — LORAZEPAM 1 MG PO TABS
1.0000 mg | ORAL_TABLET | ORAL | Status: DC | PRN
  Filled 2022-06-27: qty 1

## 2022-06-27 MED ORDER — PRAZOSIN HCL 1 MG PO CAPS: 1.0000 mg | ORAL_CAPSULE | Freq: Every day | ORAL | Status: DC

## 2022-06-27 MED ORDER — PANTOPRAZOLE SODIUM 40 MG PO TBEC
40.0000 mg | DELAYED_RELEASE_TABLET | Freq: Every day | ORAL | Status: DC
  Administered 2022-06-27: 40 mg via ORAL
  Filled 2022-06-27: qty 1

## 2022-06-27 MED ORDER — ZIPRASIDONE MESYLATE 20 MG IM SOLR: 20.0000 mg | INTRAMUSCULAR | Status: DC | PRN

## 2022-06-27 MED ORDER — ZIPRASIDONE MESYLATE 20 MG IM SOLR
20.0000 mg | INTRAMUSCULAR | Status: DC | PRN
  Filled 2022-06-27: qty 20

## 2022-06-27 MED ORDER — LORAZEPAM 1 MG PO TABS
1.0000 mg | ORAL_TABLET | Freq: Four times a day (QID) | ORAL | Status: AC
  Administered 2022-06-27 – 2022-06-28 (×4): 1 mg via ORAL
  Filled 2022-06-27 (×4): qty 1

## 2022-06-27 MED ORDER — PANTOPRAZOLE SODIUM 40 MG PO TBEC: 40.0000 mg | DELAYED_RELEASE_TABLET | Freq: Every day | ORAL | Status: DC

## 2022-06-27 MED ORDER — ACETAMINOPHEN 325 MG PO TABS
650.0000 mg | ORAL_TABLET | Freq: Four times a day (QID) | ORAL | Status: DC | PRN
  Administered 2022-06-27 – 2022-06-30 (×2): 650 mg via ORAL
  Filled 2022-06-27: qty 2

## 2022-06-27 MED ORDER — LORAZEPAM 1 MG PO TABS: 1.0000 mg | ORAL_TABLET | ORAL | Status: DC | PRN

## 2022-06-27 MED ORDER — MAGNESIUM HYDROXIDE 400 MG/5ML PO SUSP: 30.0000 mL | Freq: Every day | ORAL | Status: DC | PRN

## 2022-06-27 MED ORDER — ZIPRASIDONE MESYLATE 20 MG IM SOLR
20.0000 mg | Freq: Two times a day (BID) | INTRAMUSCULAR | Status: DC | PRN
  Administered 2022-06-27: 20 mg via INTRAMUSCULAR
  Filled 2022-06-27: qty 20

## 2022-06-27 MED ORDER — GABAPENTIN 100 MG PO CAPS
200.0000 mg | ORAL_CAPSULE | Freq: Three times a day (TID) | ORAL | Status: DC
  Administered 2022-06-27 – 2022-06-30 (×6): 200 mg via ORAL
  Filled 2022-06-27 (×7): qty 2

## 2022-06-27 MED ORDER — SERTRALINE HCL 50 MG PO TABS
50.0000 mg | ORAL_TABLET | Freq: Every day | ORAL | Status: DC
  Administered 2022-06-27: 50 mg via ORAL
  Filled 2022-06-27: qty 1

## 2022-06-27 MED ORDER — ONDANSETRON 4 MG PO TBDP: 4.0000 mg | ORAL_TABLET | Freq: Four times a day (QID) | ORAL | Status: DC | PRN

## 2022-06-27 MED ORDER — LORAZEPAM 1 MG PO TABS: 1.0000 mg | ORAL_TABLET | Freq: Every day | ORAL | Status: DC

## 2022-06-27 MED ORDER — NICOTINE 21 MG/24HR TD PT24
21.0000 mg | MEDICATED_PATCH | Freq: Once | TRANSDERMAL | Status: DC
  Administered 2022-06-27: 21 mg via TRANSDERMAL

## 2022-06-27 MED ORDER — HYDROXYZINE HCL 25 MG PO TABS
25.0000 mg | ORAL_TABLET | Freq: Four times a day (QID) | ORAL | Status: DC | PRN
  Administered 2022-06-28 – 2022-06-30 (×3): 25 mg via ORAL
  Filled 2022-06-27 (×4): qty 1

## 2022-06-27 MED ORDER — BUSPIRONE HCL 10 MG PO TABS
10.0000 mg | ORAL_TABLET | Freq: Three times a day (TID) | ORAL | Status: DC
  Administered 2022-06-27: 10 mg via ORAL
  Filled 2022-06-27: qty 1

## 2022-06-27 MED ORDER — LOPERAMIDE HCL 2 MG PO CAPS: 2.0000 mg | ORAL_CAPSULE | ORAL | Status: DC | PRN

## 2022-06-27 MED ORDER — LORAZEPAM 1 MG PO TABS
1.0000 mg | ORAL_TABLET | Freq: Four times a day (QID) | ORAL | Status: DC | PRN
  Administered 2022-06-27: 1 mg via ORAL
  Filled 2022-06-27: qty 1

## 2022-06-27 MED ORDER — ALUM & MAG HYDROXIDE-SIMETH 200-200-20 MG/5ML PO SUSP: 30.0000 mL | ORAL | Status: DC | PRN

## 2022-06-27 MED ORDER — LORAZEPAM 1 MG PO TABS
1.0000 mg | ORAL_TABLET | Freq: Four times a day (QID) | ORAL | Status: DC
  Administered 2022-06-27: 1 mg via ORAL
  Filled 2022-06-27: qty 1

## 2022-06-27 MED ORDER — LOPERAMIDE HCL 2 MG PO CAPS
2.0000 mg | ORAL_CAPSULE | ORAL | Status: DC | PRN
  Administered 2022-06-28 – 2022-06-29 (×2): 4 mg via ORAL
  Filled 2022-06-27: qty 1
  Filled 2022-06-27: qty 2

## 2022-06-27 MED ORDER — GABAPENTIN 100 MG PO CAPS: 200.0000 mg | ORAL_CAPSULE | Freq: Three times a day (TID) | ORAL | Status: DC

## 2022-06-27 MED ORDER — PRAZOSIN HCL 1 MG PO CAPS
1.0000 mg | ORAL_CAPSULE | Freq: Every day | ORAL | Status: DC
  Administered 2022-06-27: 1 mg via ORAL
  Filled 2022-06-27: qty 1

## 2022-06-27 MED ORDER — LORAZEPAM 1 MG PO TABS
1.0000 mg | ORAL_TABLET | Freq: Four times a day (QID) | ORAL | Status: DC | PRN
  Administered 2022-06-28: 1 mg via ORAL
  Filled 2022-06-27: qty 1

## 2022-06-27 MED ORDER — NICOTINE 21 MG/24HR TD PT24
21.0000 mg | MEDICATED_PATCH | Freq: Every day | TRANSDERMAL | Status: DC
  Administered 2022-06-28 – 2022-06-30 (×3): 21 mg via TRANSDERMAL
  Filled 2022-06-27 (×4): qty 1

## 2022-06-27 MED ORDER — BUSPIRONE HCL 5 MG PO TABS
10.0000 mg | ORAL_TABLET | Freq: Three times a day (TID) | ORAL | Status: DC
  Administered 2022-06-27 – 2022-06-30 (×7): 10 mg via ORAL
  Filled 2022-06-27 (×8): qty 2

## 2022-06-27 MED ORDER — PANTOPRAZOLE SODIUM 40 MG PO TBEC
40.0000 mg | DELAYED_RELEASE_TABLET | Freq: Every day | ORAL | Status: DC
  Administered 2022-06-29 – 2022-06-30 (×2): 40 mg via ORAL
  Filled 2022-06-27 (×3): qty 1

## 2022-06-27 MED ORDER — BUSPIRONE HCL 10 MG PO TABS: 10.0000 mg | ORAL_TABLET | Freq: Three times a day (TID) | ORAL | Status: DC

## 2022-06-27 MED ORDER — SERTRALINE HCL 25 MG PO TABS
50.0000 mg | ORAL_TABLET | Freq: Every day | ORAL | Status: DC
  Administered 2022-06-29 – 2022-06-30 (×2): 50 mg via ORAL
  Filled 2022-06-27 (×3): qty 2

## 2022-06-27 MED ORDER — OLANZAPINE 5 MG PO TBDP: 5.0000 mg | ORAL_TABLET | Freq: Three times a day (TID) | ORAL | Status: DC | PRN

## 2022-06-27 MED ORDER — TRAZODONE HCL 50 MG PO TABS
50.0000 mg | ORAL_TABLET | Freq: Every evening | ORAL | Status: DC | PRN
  Administered 2022-06-29: 50 mg via ORAL
  Filled 2022-06-27: qty 1

## 2022-06-27 MED ORDER — ALUM & MAG HYDROXIDE-SIMETH 200-200-20 MG/5ML PO SUSP
30.0000 mL | ORAL | Status: DC | PRN
Start: 2022-06-27 — End: 2022-06-27
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
## 2022-06-27 NOTE — Discharge Instructions (Addendum)
Transfer to [facility] for IP admission, Dr. Weber Cooks is accepting md

## 2022-06-27 NOTE — Plan of Care (Signed)
New admission.  Problem: Education: Goal: Knowledge of General Education information will improve Description: Including pain rating scale, medication(s)/side effects and non-pharmacologic comfort measures Outcome: Not Progressing   Problem: Health Behavior/Discharge Planning: Goal: Ability to manage health-related needs will improve Outcome: Not Progressing   Problem: Clinical Measurements: Goal: Ability to maintain clinical measurements within normal limits will improve Outcome: Not Progressing Goal: Will remain free from infection Outcome: Not Progressing Goal: Diagnostic test results will improve Outcome: Not Progressing Goal: Respiratory complications will improve Outcome: Not Progressing Goal: Cardiovascular complication will be avoided Outcome: Not Progressing   Problem: Activity: Goal: Risk for activity intolerance will decrease Outcome: Not Progressing   Problem: Nutrition: Goal: Adequate nutrition will be maintained Outcome: Not Progressing   Problem: Coping: Goal: Level of anxiety will decrease Outcome: Not Progressing   Problem: Elimination: Goal: Will not experience complications related to bowel motility Outcome: Not Progressing Goal: Will not experience complications related to urinary retention Outcome: Not Progressing   Problem: Pain Managment: Goal: General experience of comfort will improve Outcome: Not Progressing   Problem: Safety: Goal: Ability to remain free from injury will improve Outcome: Not Progressing   Problem: Skin Integrity: Goal: Risk for impaired skin integrity will decrease Outcome: Not Progressing   Problem: Education: Goal: Knowledge of [place] General Education information/materials will improve Outcome: Not Progressing Goal: Emotional status will improve Outcome: Not Progressing Goal: Mental status will improve Outcome: Not Progressing Goal: Verbalization of understanding the information provided will  improve Outcome: Not Progressing   Problem: Safety: Goal: Periods of time without injury will increase Outcome: Not Progressing   Problem: Physical Regulation: Goal: Complications related to the disease process, condition or treatment will be avoided or minimized Outcome: Not Progressing   Problem: Self-Concept: Goal: Level of anxiety will decrease Outcome: Not Progressing   Problem: Coping: Goal: Coping ability will improve Outcome: Not Progressing Goal: Will verbalize feelings Outcome: Not Progressing

## 2022-06-27 NOTE — Group Note (Signed)
Date:  06/27/2022 Time:  9:09 PM  Group Topic/Focus:  Wrap-Up Group:   The focus of this group is to help patients review their daily goal of treatment and discuss progress on daily workbooks.    Participation Level:  Active  Participation Quality:  Appropriate  Affect:  Appropriate  Cognitive:  Appropriate  Insight: Improving  Engagement in Group:  Limited  Modes of Intervention:  Clarification  Additional Comments:     [name] Plan 06/27/2022, 9:09 PM
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
## 2022-06-27 NOTE — Progress Notes (Signed)
Pt was accepted to [facility] 06/27/2022, pending IVC paperwork faxed to [phone]. Bed assignment: [id number]  Pt meets inpatient criteria per Thomes Lolling, NP  Attending Physician will be Alethia Berthold, MD  Report can be called to: [phone]  Pt can arrive after pending items are received  Care Team Notified: [facility] Lynnda Shields, RN, Thomes Lolling, NP, Leonia Reader, RN, and Lyda Kalata, RN  [place], [place]  06/27/2022 1:42 PM
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
## 2022-06-27 NOTE — Tx Team (Signed)
Initial Treatment Plan 06/27/2022 6:17 PM [name] [id number]    PATIENT STRESSORS: Financial difficulties   Marital or family conflict   Medication change or noncompliance   Substance abuse     PATIENT STRENGTHS: Marketing executive fund of knowledge  Work skills    PATIENT IDENTIFIED PROBLEMS: Substance abuse  Family conflict  Depression  Anxiety  Homeless             DISCHARGE CRITERIA:  Improved stabilization in mood, thinking, and/or behavior Need for constant or close observation no longer present Reduction of life-threatening or endangering symptoms to within safe limits  PRELIMINARY DISCHARGE PLAN: Outpatient therapy Return to previous work or school arrangements Referrals indicated:  Patient will be homeless upon discharge  PATIENT/FAMILY INVOLVEMENT: This treatment plan has been presented to and reviewed with the patient, [name]. The patient has been given the opportunity to ask questions and make suggestions.  Deziya Amero, RN 06/27/2022, 6:17 PM
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
## 2022-06-27 NOTE — ED Provider Notes (Signed)
Call contact made to [employer].  Address was given for [place]. and [place].  Inform animal control that inside the RV parked in her parents driveway is a dog and cat.  Patient's mother has not answered our call.  Staff member with animal control reports they will follow-up.
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
## 2022-06-27 NOTE — ED Notes (Signed)
Pt resting quietly . Respirations even and unlabored.
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
## 2022-06-27 NOTE — Progress Notes (Signed)
Report called to [facility], Therapist, sports. [employer] for [place] of county transport to [facility]. Corporal Pennington aware of call and will have staff return call when deputy is available to transport. IVC faxed to [facility].

## 2022-06-27 NOTE — ED Notes (Signed)
Pt resting quietly after allowing to look at [employer]

## 2022-06-27 NOTE — ED Notes (Signed)
Pt presents with irritable mood, affect congruent. Ashlye reports '' I don't know if they will let me go but I've been using xanax and taking 2mg  3 or 5 an day. '' Pt states she '' is worried about my dog. 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 I want to go home. '' Pt states she has no plan or desire to get treatment and denies any SI HI or AV Hallucinations. Pt states '' I was under the influence when I came in and they IVC'd me . I don't know if they will let me go but I'd like to .''  Offered breakfast and declined. Prn medications given for withdrawal sxs and notified NP. Pt given juice water. Pt is safe.

## 2022-06-28 DIAGNOSIS — F603 Borderline personality disorder: Secondary | ICD-10-CM | POA: Diagnosis not present

## 2022-06-28 MED ORDER — HALOPERIDOL LACTATE 5 MG/ML IJ SOLN
5.0000 mg | Freq: Four times a day (QID) | INTRAMUSCULAR | Status: DC | PRN
  Filled 2022-06-28: qty 1

## 2022-06-28 MED ORDER — DIPHENHYDRAMINE HCL 25 MG PO CAPS
50.0000 mg | ORAL_CAPSULE | Freq: Four times a day (QID) | ORAL | Status: DC | PRN
  Administered 2022-06-28: 50 mg via ORAL
  Filled 2022-06-28: qty 2

## 2022-06-28 MED ORDER — LORAZEPAM 2 MG PO TABS
2.0000 mg | ORAL_TABLET | Freq: Four times a day (QID) | ORAL | Status: DC | PRN
  Administered 2022-06-28: 2 mg via ORAL
  Filled 2022-06-28: qty 1

## 2022-06-28 MED ORDER — PRAZOSIN HCL 2 MG PO CAPS
2.0000 mg | ORAL_CAPSULE | Freq: Every day | ORAL | Status: DC
  Administered 2022-06-29: 2 mg via ORAL
  Filled 2022-06-28: qty 1

## 2022-06-28 MED ORDER — HALOPERIDOL 5 MG PO TABS
5.0000 mg | ORAL_TABLET | Freq: Four times a day (QID) | ORAL | Status: DC | PRN
  Filled 2022-06-28: qty 1

## 2022-06-28 MED ORDER — MIDAZOLAM HCL 2 MG/2ML IJ SOLN: 4.0000 mg | Freq: Four times a day (QID) | INTRAMUSCULAR | Status: DC | PRN

## 2022-06-28 MED ORDER — OLANZAPINE 10 MG IM SOLR: 10.0000 mg | Freq: Four times a day (QID) | INTRAMUSCULAR | Status: DC | PRN

## 2022-06-28 MED ORDER — CLONAZEPAM 1 MG PO TABS
1.0000 mg | ORAL_TABLET | Freq: Three times a day (TID) | ORAL | Status: DC
  Administered 2022-06-28 – 2022-06-30 (×7): 1 mg via ORAL
  Filled 2022-06-28 (×7): qty 1

## 2022-06-28 MED ORDER — NICOTINE POLACRILEX 2 MG MT GUM: 2.0000 mg | CHEWING_GUM | OROMUCOSAL | Status: DC | PRN

## 2022-06-28 MED ORDER — ZIPRASIDONE MESYLATE 20 MG IM SOLR: 20.0000 mg | Freq: Two times a day (BID) | INTRAMUSCULAR | Status: DC | PRN

## 2022-06-28 MED ORDER — ENSURE ENLIVE PO LIQD
237.0000 mL | Freq: Two times a day (BID) | ORAL | Status: DC
  Administered 2022-06-28 – 2022-06-30 (×3): 237 mL via ORAL

## 2022-06-28 MED ORDER — DIPHENHYDRAMINE HCL 50 MG/ML IJ SOLN: 50.0000 mg | Freq: Four times a day (QID) | INTRAMUSCULAR | Status: DC | PRN

## 2022-06-28 NOTE — Group Note (Signed)
[place] LCSW Group Therapy Note   Group Date: 06/28/2022 Start Time: 1300 End Time: 1400   Type of Therapy/Topic:  Group Therapy:  Emotion Regulation  Participation Level:  Did Not Attend   Mood:  Description of Group:    The purpose of this group is to assist patients in learning to regulate negative emotions and experience positive emotions. Patients will be guided to discuss ways in which they have been vulnerable to their negative emotions. These vulnerabilities will be juxtaposed with experiences of positive emotions or situations, and patients challenged to use positive emotions to combat negative ones. Special emphasis will be placed on coping with negative emotions in conflict situations, and patients will process healthy conflict resolution skills.  Therapeutic Goals: Patient will identify two positive emotions or experiences to reflect on in order to balance out negative emotions:  Patient will label two or more emotions that they find the most difficult to experience:  Patient will be able to demonstrate positive conflict resolution skills through discussion or role plays:   Summary of Patient Progress:   X    Therapeutic Modalities:   Cognitive Behavioral Therapy Feelings Identification Dialectical Behavioral Therapy   Rozann Lesches, LCSW

## 2022-06-28 NOTE — BH IP Treatment Plan (Signed)
Interdisciplinary Treatment and Diagnostic Plan Update  06/28/2022 Time of Session: 0830 [name] MRN: [id number]  Principal Diagnosis: <principal problem not specified>  Secondary Diagnoses: Active Problems:   Substance induced mood disorder (HCC)   Current Medications:  Current Facility-Administered Medications  Medication Dose Route Frequency Provider Last Rate Last Admin   acetaminophen (TYLENOL) tablet 650 mg  650 mg Oral Q6H PRN Revonda Humphrey, NP   650 mg at 06/27/22 1713   alum & mag hydroxide-simeth (MAALOX/MYLANTA) 200-200-20 MG/5ML suspension 30 mL  30 mL Oral Q4H PRN Revonda Humphrey, NP       busPIRone (BUSPAR) tablet 10 mg  10 mg Oral TID Revonda Humphrey, NP   10 mg at 06/27/22 1710   gabapentin (NEURONTIN) capsule 200 mg  200 mg Oral TID Revonda Humphrey, NP   200 mg at 06/27/22 1710   hydrOXYzine (ATARAX) tablet 25 mg  25 mg Oral Q6H PRN Revonda Humphrey, NP       loperamide (IMODIUM) capsule 2-4 mg  2-4 mg Oral PRN Revonda Humphrey, NP   4 mg at 06/28/22 0641   OLANZapine zydis (ZYPREXA) disintegrating tablet 5 mg  5 mg Oral Q8H PRN Revonda Humphrey, NP       And   LORazepam (ATIVAN) tablet 1 mg  1 mg Oral PRN Revonda Humphrey, NP       And   ziprasidone (GEODON) injection 20 mg  20 mg Intramuscular PRN Revonda Humphrey, NP       LORazepam (ATIVAN) tablet 1 mg  1 mg Oral Q6H PRN Revonda Humphrey, NP   1 mg at 06/28/22 [id number]   LORazepam (ATIVAN) tablet 1 mg  1 mg Oral QID Revonda Humphrey, NP   1 mg at 06/28/22 [id number]   Followed by   LORazepam (ATIVAN) tablet 1 mg  1 mg Oral TID Revonda Humphrey, NP       Followed by   Derrill Memo ON 06/29/2022] LORazepam (ATIVAN) tablet 1 mg  1 mg Oral BID Revonda Humphrey, NP       Followed by   Derrill Memo ON 07/01/2022] LORazepam (ATIVAN) tablet 1 mg  1 mg Oral Daily Revonda Humphrey, NP       magnesium hydroxide (MILK OF MAGNESIA) suspension 30 mL  30 mL Oral Daily PRN Revonda Humphrey, NP        nicotine (NICODERM CQ - dosed in mg/24 hours) patch 21 mg  21 mg Transdermal Daily Clapacs, Madie Reno, MD       ondansetron (ZOFRAN-ODT) disintegrating tablet 4 mg  4 mg Oral Q6H PRN Revonda Humphrey, NP       pantoprazole (PROTONIX) EC tablet 40 mg  40 mg Oral Daily Revonda Humphrey, NP       prazosin (MINIPRESS) capsule 1 mg  1 mg Oral QHS Revonda Humphrey, NP   1 mg at 06/27/22 2307   sertraline (ZOLOFT) tablet 50 mg  50 mg Oral Daily Revonda Humphrey, NP       traZODone (DESYREL) tablet 50 mg  50 mg Oral QHS PRN Revonda Humphrey, NP       PTA Medications: Medications Prior to Admission  Medication Sig Dispense Refill Last Dose   albuterol (VENTOLIN HFA) 108 (90 Base) MCG/ACT inhaler Inhale 2 puffs into the lungs every 6 (six) hours as needed for wheezing or shortness of breath. 8.5 g 1    busPIRone (BUSPAR) 10 MG tablet  Take 1 tablet (10 mg total) by mouth 3 (three) times daily.      gabapentin (NEURONTIN) 100 MG capsule Take 2 capsules (200 mg total) by mouth 3 (three) times daily.      pantoprazole (PROTONIX) 40 MG tablet Take 1 tablet (40 mg total) by mouth daily.      prazosin (MINIPRESS) 1 MG capsule Take 1 capsule (1 mg total) by mouth at bedtime.      sertraline (ZOLOFT) 50 MG tablet Take 50 mg by mouth daily.      traZODone (DESYREL) 100 MG tablet Take 0.5 tablets (50 mg total) by mouth at bedtime as needed for sleep. Patient to take take full tablet (100 mg total) if sleeping disturbances persist. 30 tablet 1     Patient Stressors: Financial difficulties   Marital or family conflict   Medication change or noncompliance   Substance abuse    Patient Strengths: Marketing executive fund of knowledge  Work skills   Treatment Modalities: Medication Management, Group therapy, Case management,  1 to 1 session with clinician, Psychoeducation, Recreational therapy.   Physician Treatment Plan for Primary Diagnosis: <principal problem not specified> Long Term  Goal(s):     Short Term Goals:    Medication Management: Evaluate patient's response, side effects, and tolerance of medication regimen.  Therapeutic Interventions: 1 to 1 sessions, Unit Group sessions and Medication administration.  Evaluation of Outcomes: Progressing  Physician Treatment Plan for Secondary Diagnosis: Active Problems:   Substance induced mood disorder ([place])  Long Term Goal(s):     Short Term Goals:       Medication Management: Evaluate patient's response, side effects, and tolerance of medication regimen.  Therapeutic Interventions: 1 to 1 sessions, Unit Group sessions and Medication administration.  Evaluation of Outcomes: Progressing   RN Treatment Plan for Primary Diagnosis: <principal problem not specified> Long Term Goal(s): Knowledge of disease and therapeutic regimen to maintain health will improve  Short Term Goals: Ability to remain free from injury will improve, Ability to verbalize frustration and anger appropriately will improve, Ability to demonstrate self-control, Ability to participate in decision making will improve, Ability to verbalize feelings will improve, Ability to disclose and discuss suicidal ideas, Ability to identify and develop effective coping behaviors will improve, and Compliance with prescribed medications will improve  Medication Management: RN will administer medications as ordered by provider, will assess and evaluate patient's response and provide education to patient for prescribed medication. RN will report any adverse and/or side effects to prescribing provider.  Therapeutic Interventions: 1 on 1 counseling sessions, Psychoeducation, Medication administration, Evaluate responses to treatment, Monitor vital signs and CBGs as ordered, Perform/monitor CIWA, COWS, AIMS and Fall Risk screenings as ordered, Perform wound care treatments as ordered.  Evaluation of Outcomes: Progressing   LCSW Treatment Plan for Primary Diagnosis:  <principal problem not specified> Long Term Goal(s): Safe transition to appropriate next level of care at discharge, Engage patient in therapeutic group addressing interpersonal concerns.  Short Term Goals: Engage patient in aftercare planning with referrals and resources, Increase social support, Increase ability to appropriately verbalize feelings, Increase emotional regulation, Facilitate acceptance of mental health diagnosis and concerns, Facilitate patient progression through stages of change regarding substance use diagnoses and concerns, Identify triggers associated with mental health/substance abuse issues, and Increase skills for wellness and recovery  Therapeutic Interventions: Assess for all discharge needs, 1 to 1 time with Social worker, Explore available resources and support systems, Assess for adequacy in community support network, Educate family  and significant other(s) on suicide prevention, Complete Psychosocial Assessment, Interpersonal group therapy.  Evaluation of Outcomes: Progressing   Progress in Treatment: Attending groups: Yes. Participating in groups: No. Taking medication as prescribed: Yes. Toleration medication: Yes. Family/Significant other contact made: No, will contact:  CSW will obtain consent to reach family/friend.  Patient understands diagnosis: Yes. Discussing patient identified problems/goals with staff: Yes. Medical problems stabilized or resolved: Yes. Denies suicidal/homicidal ideation: No. Issues/concerns per patient self-inventory: Yes. Other: none  New problem(s) identified: No, Describe:  none  New Short Term/Long Term Goal(s): Patient to work towards detox, elimination of symptoms of psychosis, medication management for mood stabilization; elimination of SI thoughts; development of comprehensive mental wellness/sobriety plan.  Patient Goals:  Patient states their goal for treatment is to "I lost everything . . . Dog and cat . . Job . [employer] Home  "  Discharge Plan or Barriers: Patient is homeless, CSW team to discuss housing options"   Reason for Continuation of Hospitalization: Depression Medication stabilization Suicidal ideation  Estimated Length of Stay: 1-7 days   Last 3 [place] Suicide Severity Risk Score: Flowsheet Row Admission (Current) from 06/27/2022 in [place] Most recent reading at 06/27/2022  5:00 PM ED from 06/27/2022 in [facility] Most recent reading at 06/27/2022  5:40 AM Video Visit from 05/18/2022 in [facility] Most recent reading at 05/18/2022  4:21 PM  C-SSRS RISK CATEGORY Error: Q3, 4, or 5 should not be populated when Q2 is No Moderate Risk Moderate Risk       Last PHQ 2/9 Scores:    05/18/2022    4:21 PM 01/11/2022   10:38 AM 01/10/2022   10:38 AM  Depression screen PHQ 2/9  Decreased Interest 3 3 3   Down, Depressed, Hopeless 3 3 3   PHQ - 2 Score 6 6 6   Altered sleeping 3 2 2   Tired, decreased energy 2 1 1   Change in appetite 2 2 3   Feeling bad or failure about yourself  3 3 3   Trouble concentrating 2 3 3   Moving slowly or fidgety/restless 2 3 2   Suicidal thoughts 1 1 2   PHQ-9 Score 21 21 22   Difficult doing work/chores Very difficult Extremely dIfficult Extremely dIfficult    Scribe for Treatment Team: Larose Kells 06/28/2022 9:36 AM

## 2022-06-28 NOTE — Group Note (Signed)
Date:  06/28/2022 Time:  5:28 PM  Group Topic/Focus:  Activity Group    Participation Level:  Did Not Attend   Adela Lank [facility] 06/28/2022, 5:28 PM

## 2022-06-28 NOTE — Progress Notes (Signed)
Pt presents with depression; anxiety and hopelessness, endorses withdrawal from benzo and being now homeless and jobless.  Pt states "I don't have anybody" but explicitly denies thoughts of self harm.  Pt rested in bed through the night and was given PRN Ativan r/t 6 am CIWA and PRN for loose stools.    06/27/22 2000  Psych Admission Type (Psych Patients Only)  Admission Status Involuntary  Psychosocial Assessment  Patient Complaints Anxiety;Depression;Hopelessness  Eye Contact Fair  Facial Expression Worried  Affect Anxious;Preoccupied  Catering manager Activity Slow  Appearance/Hygiene Disheveled  Behavior Characteristics Cooperative;Appropriate to situation  Mood Anxious  Thought Process  Coherency Concrete thinking  Content Blaming others;Blaming self  Delusions None reported or observed  Perception WDL  Hallucination None reported or observed  Judgment Limited  Confusion None  Danger to Self  Current suicidal ideation? Denies  Self-Injurious Behavior No self-injurious ideation or behavior indicators observed or expressed   Agreement Not to Harm Self Yes  Description of Agreement verbal  Danger to Others  Danger to Others None reported or observed

## 2022-06-28 NOTE — Progress Notes (Signed)
Pt denies SI/HI/AVH and verbally agrees to approach staff if these become apparent or before harming themselves/others. Rates depression 10/10. Rates anxiety 10/10. Rates pain 5/10. Pt stated she is hopeless, there's no point in anything anymore, that she has nothing and no one, that she just gives up, and that she is tired. Pt would repeat this over and over, no matter what reassurance she was given. Pt is very negative and has no insight. Pt states she just wants to get the f out of here and get her stuff. When RN tried to advocate for pt, pt stated "f my things. F my dad. F everything." Pt yelled multiple times. Pt refused medication stating that there is no point because "I won't have a way to get them when I leave, I am homeless and I have lost everything that ever mattered to me. I have nothing." Pt slept after PRN meds given till this evening. Pt crying in her room. Pt reassured. Scheduled medications administered to pt, per MD orders. RN provided support and encouragement to pt. Q15 min safety checks implemented and continued. Pt is safe on the unit. Plan of care on going and no other concerns expressed at this time.  06/28/22 0900  Psych Admission Type (Psych Patients Only)  Admission Status Involuntary  Psychosocial Assessment  Patient Complaints Anxiety;Depression;Crying spells;Anger;Agitation;Shakiness;Substance abuse;Hopelessness  Eye Contact Brief  Facial Expression Worried;Angry;Anxious  Affect Anxious;Angry;Irritable;Sad;Preoccupied  Speech Aggressive;Argumentative  Interaction Defensive;Hostile;Attention-seeking  Motor Activity Tremors;Slow  Appearance/Hygiene Disheveled  Behavior Characteristics Agressive verbally;Agitated;Anxious;Irritable  Mood Anxious;Depressed;Labile;Angry;Helpless  Aggressive Behavior  Effect No apparent injury  Thought Process  Coherency Concrete thinking  Content Blaming others;Preoccupation  Delusions None reported or observed  Perception WDL   Hallucination None reported or observed  Judgment Limited  Confusion None  Danger to Self  Current suicidal ideation? Denies  Self-Injurious Behavior Some self-injurious ideation observed or expressed.  No lethal plan expressed   Danger to Others  Danger to Others None reported or observed

## 2022-06-28 NOTE — Plan of Care (Signed)
  Problem: Education: Goal: Knowledge of General Education information will improve Description: Including pain rating scale, medication(s)/side effects and non-pharmacologic comfort measures Outcome: Not Progressing   Problem: Activity: Goal: Risk for activity intolerance will decrease Outcome: Not Progressing   Problem: Nutrition: Goal: Adequate nutrition will be maintained Outcome: Not Progressing   Problem: Coping: Goal: Level of anxiety will decrease Outcome: Not Progressing   Problem: Education: Goal: Emotional status will improve Outcome: Not Progressing Goal: Mental status will improve Outcome: Not Progressing

## 2022-06-28 NOTE — Progress Notes (Signed)
1126: Pt started to scream and yell at the MD in his office. Security and [employer] sootd at door. Pt walked out screaming, walked around the corner to large trash roller and threw it upside down. Pt slammed door and punched wall per MHT while RN was getting medications. Pt still argumentative, came up to the medication room and willing to take PO but stated that she is only doing it so she can get out of here. Pt stated, "it's not going to do anything anyways." Pt stated that she wants her xanax. Pt is labile and borderline. Pt is now in her room and in her bed. Pt is safe. No other concerns expressed at this time.

## 2022-06-28 NOTE — Group Note (Signed)
Date:  06/28/2022 Time:  10:27 AM  Group Topic/Focus:  Community Meeting    Participation Level:  Did Not Attend   Adela Lank [facility] 06/28/2022, 10:27 AM

## 2022-06-28 NOTE — H&P (Signed)
Psychiatric Admission Assessment Adult  Patient Identification: [name] MRN:  [id number] Date of Evaluation:  06/28/2022 Chief Complaint:  Substance induced mood disorder ([place]) [F19.94] Principal Diagnosis: Borderline personality disorder ([place]) Diagnosis:  Principal Problem:   Borderline personality disorder ([place]) Active Problems:   Benzodiazepine dependence, continuous ([place])   Alcohol use disorder   Substance induced mood disorder ([place])  History of Present Illness: Patient seen and chart reviewed.  42 year old woman with a history of longstanding mood problems behavior problems and substance abuse.  She presented to the hospital in [place] after law enforcement were called to the place where she lives.  Patient stays in a recreational vehicle parked behind her parent's house.  On the day of presentation she had grabbed a gun out of her mother's car and taken it into her RV.  Patient admits that she did this because she was thinking of killing herself.  It took 6 officers reportedly around her RV to eventually convince her to come out which she resisted at first because she claims she thought they were going to beat her up.  Patient justifies and argues about all this and refuses to acknowledge it as being a problem.  She is entirely focused on her anger that she has been brought to the hospital and that her parents have filed a restraining order against her presumably making it impossible for her to go back and stay at their house.  Also apparently she was told she lost her job.  Chronic anxiety and depression.  Has not been seeing her outpatient psychiatrist for several months and has not been taking the Zoloft.  She has continued to use illegal Xanax which she purchases off the street telling me her daily use is somewhere between 2 mg and 8 mg with the average probably being closer to 6.  Also was intoxicated with alcohol level of 156 on presentation.  She admits she had consumed  some liquor but again minimizes it and refuses to discuss it or acknowledge that it is a problem.  Does not appear to be having hallucinations.  Denies current wish to die or hurt anyone else. Associated Signs/Symptoms: Depression Symptoms:  depressed mood, anhedonia, psychomotor agitation, difficulty concentrating, hopelessness, suicidal thoughts with specific plan, anxiety, loss of energy/fatigue, (Hypo) Manic Symptoms:  Impulsivity, Irritable Mood, Labiality of Mood, Anxiety Symptoms:  Excessive Worry, Psychotic Symptoms:   None reported PTSD Symptoms: Patient has a history of a diagnosis of PTSD.  We were not able to have a good enough interview to inquire specifically about symptoms but it is certainly possible that a lot of her problems could be related to a history of trauma Total Time spent with patient: 45 minutes  Past Psychiatric History: Past history of longstanding mood and behavior problems and substance abuse problems.  Identified problem with dependency on benzodiazepines with illicit use.  Frequent abuse of alcohol and opiates in the past as well.  Multiple prior hospitalizations multiple prior suicide attempts.  No evidence that I can see of clear psychosis and overall diagnosis appears to be more typical of borderline personality disorder with substance use issues.  Is the patient at risk to self? Yes.    Has the patient been a risk to self in the past 6 months? Yes.    Has the patient been a risk to self within the distant past? Yes.    Is the patient a risk to others? No.  Has the patient been a risk to others in the  past 6 months? No.  Has the patient been a risk to others within the distant past? No.   [place] Scale:  Flowsheet Row Admission (Current) from 06/27/2022 in [place] Most recent reading at 06/28/2022 10:22 AM ED from 06/27/2022 in [facility] Most recent reading at 06/27/2022  5:40 AM Video Visit  from 05/18/2022 in [facility] Most recent reading at 05/18/2022  4:21 PM  C-SSRS RISK CATEGORY Low Risk Moderate Risk Moderate Risk        Prior Inpatient Therapy: Yes.   If yes, describe multiple prior hospitalizations Prior Outpatient Therapy: Yes.   If yes, describe has been seen by the outpatient clinic at [facility].  Alcohol Screening: 1. How often do you have a drink containing alcohol?: Monthly or less 2. How many drinks containing alcohol do you have on a typical day when you are drinking?: 5 or 6 3. How often do you have six or more drinks on one occasion?: Monthly AUDIT-C Score: 5 4. How often during the last year have you found that you were not able to stop drinking once you had started?: Never 5. How often during the last year have you failed to do what was normally expected from you because of drinking?: Never 6. How often during the last year have you needed a first drink in the morning to get yourself going after a heavy drinking session?: Never 7. How often during the last year have you had a feeling of guilt of remorse after drinking?: Never 8. How often during the last year have you been unable to remember what happened the night before because you had been drinking?: Never 9. Have you or someone else been injured as a result of your drinking?: No 10. Has a relative or friend or a doctor or another health worker been concerned about your drinking or suggested you cut down?: No Alcohol Use Disorder Identification Test Final Score (AUDIT): 5 Alcohol Brief Interventions/Follow-up: Alcohol education/Brief advice Substance Abuse History in the last 12 months:  Yes.   Consequences of Substance Abuse: Benzodiazepine abuse and alcohol and opiate use problems all contributing to mood instability behavior problems and possibility of getting along with family frequent hospitalizations and suicidal ideation Previous Psychotropic Medications: Yes   Psychological Evaluations: Yes  Past Medical History:  Past Medical History:  Diagnosis Date   Anxiety    Benzodiazepine abuse ([place])    Bipolar 1 disorder ([place])    Depression    Drug abuse ([place])    ETOH abuse    Polysubstance abuse ([place])    History reviewed. No pertinent surgical history. Family History:  Family History  Problem Relation Age of Onset   Drug abuse Mother    Anxiety disorder Mother    Anxiety disorder Father    Drug abuse Maternal Aunt    Alcohol abuse Maternal Grandfather    Drug abuse Cousin    Family Psychiatric  History: Family history of substance abuse anxiety and depression Tobacco Screening:  Social History   Tobacco Use  Smoking Status Every Day   Packs/day: 1.50   Years: 17.00   Additional pack years: 0.00   Total pack years: 25.50   Types: Cigarettes  Smokeless Tobacco Never    BH Tobacco Counseling     Are you interested in Tobacco Cessation Medications?  Yes, implement Nicotene Replacement Protocol Counseled patient on smoking cessation:  Refused/Declined practical counseling Reason Tobacco Screening Not Completed: No value filed.  Social History:  Social History   Substance and Sexual Activity  Alcohol Use Yes   Comment: rarely-past etoh abuse     Social History   Substance and Sexual Activity  Drug Use Yes   Types: Marijuana, Benzodiazepines   Comment: Xanax; Klonopin    Additional Social History:                           Allergies:   Allergies  Allergen Reactions   Haldol [Haloperidol Lactate] Other (See Comments)    Makes whole body stiff   Lab Results:  Results for orders placed or performed during the hospital encounter of 06/27/22 (from the past 48 hour(s))  Resp panel by RT-PCR (RSV, Flu A&B, Covid) Anterior Nasal Swab     Status: None   Collection Time: 06/27/22  5:32 AM   Specimen: Anterior Nasal Swab  Result Value Ref Range   SARS Coronavirus 2 by RT PCR NEGATIVE NEGATIVE   Influenza A  by PCR NEGATIVE NEGATIVE   Influenza B by PCR NEGATIVE NEGATIVE    Comment: (NOTE) The Xpert Xpress SARS-CoV-2/FLU/RSV plus assay is intended as an aid in the diagnosis of influenza from Nasopharyngeal swab specimens and should not be used as a sole basis for treatment. Nasal washings and aspirates are unacceptable for Xpert Xpress SARS-CoV-2/FLU/RSV testing.  Fact Sheet for Patients: EntrepreneurPulse.com.au  Fact Sheet for Healthcare Providers: IncredibleEmployment.be  This test is not yet approved or cleared by the [place] FDA and has been authorized for detection and/or diagnosis of SARS-CoV-2 by FDA under an Emergency Use Authorization (EUA). This EUA will remain in effect (meaning this test can be used) for the duration of the COVID-19 declaration under Section 564(b)(1) of the Act, 21 U.S.C. section 360bbb-3(b)(1), unless the authorization is terminated or revoked.     Resp Syncytial Virus by PCR NEGATIVE NEGATIVE    Comment: (NOTE) Fact Sheet for Patients: EntrepreneurPulse.com.au  Fact Sheet for Healthcare Providers: IncredibleEmployment.be  This test is not yet approved or cleared by the [place] FDA and has been authorized for detection and/or diagnosis of SARS-CoV-2 by FDA under an Emergency Use Authorization (EUA). This EUA will remain in effect (meaning this test can be used) for the duration of the COVID-19 declaration under Section 564(b)(1) of the Act, 21 U.S.C. section 360bbb-3(b)(1), unless the authorization is terminated or revoked.  Performed at [facility], [place] [address], [place], [place] [address]   CBC with Differential/Platelet     Status: None   Collection Time: 06/27/22  5:32 AM  Result Value Ref Range   WBC 5.6 4.0 - 10.5 K/uL   RBC 3.94 3.87 - 5.11 MIL/uL   Hemoglobin 13.4 12.0 - 15.0 g/dL   HCT 39.3 36.0 - 46.0 %   MCV 99.7 80.0 - 100.0 fL   MCH  34.0 26.0 - 34.0 pg   MCHC 34.1 30.0 - 36.0 g/dL   RDW 13.5 11.5 - 15.5 %   Platelets 272 150 - 400 K/uL   nRBC 0.0 0.0 - 0.2 %   Neutrophils Relative % 57 %   Neutro Abs 3.2 1.7 - 7.7 K/uL   Lymphocytes Relative 33 %   Lymphs Abs 1.8 0.7 - 4.0 K/uL   Monocytes Relative 8 %   Monocytes Absolute 0.5 0.1 - 1.0 K/uL   Eosinophils Relative 1 %   Eosinophils Absolute 0.1 0.0 - 0.5 K/uL   Basophils Relative 1 %   Basophils  Absolute 0.1 0.0 - 0.1 K/uL   Immature Granulocytes 0 %   Abs Immature Granulocytes 0.01 0.00 - 0.07 K/uL    Comment: Performed at [facility], [place] [address], [place], [place] [address]  Comprehensive metabolic panel     Status: Abnormal   Collection Time: 06/27/22  5:32 AM  Result Value Ref Range   Sodium 142 135 - 145 mmol/L   Potassium 3.4 (L) 3.5 - 5.1 mmol/L   Chloride 109 98 - 111 mmol/L   CO2 22 22 - 32 mmol/L   Glucose, Bld 82 70 - 99 mg/dL    Comment: Glucose reference range applies only to samples taken after fasting for at least 8 hours.   BUN 8 6 - 20 mg/dL   Creatinine, Ser 0.57 0.44 - 1.00 mg/dL   Calcium 8.8 (L) 8.9 - 10.3 mg/dL   Total Protein 6.6 6.5 - 8.1 g/dL   Albumin 4.2 3.5 - 5.0 g/dL   AST 20 15 - 41 U/L   ALT 16 0 - 44 U/L   Alkaline Phosphatase 36 (L) 38 - 126 U/L   Total Bilirubin 0.6 0.3 - 1.2 mg/dL   GFR, Estimated >60 >60 mL/min    Comment: (NOTE) Calculated using the CKD-EPI Creatinine Equation (2021)    Anion gap 11 5 - 15    Comment: Performed at [facility] [address] [place], [place] [address]  Hemoglobin A1c     Status: None   Collection Time: 06/27/22  5:32 AM  Result Value Ref Range   Hgb A1c MFr Bld 5.2 4.8 - 5.6 %    Comment: (NOTE)         Prediabetes: 5.7 - 6.4         Diabetes: >6.4         Glycemic control for adults with diabetes: <7.0    Mean Plasma Glucose 103 mg/dL    Comment: (NOTE) Performed At: [facility] [place], [place] [id number] Rush Farmer MD  [id number]   Ethanol     Status: Abnormal   Collection Time: 06/27/22  5:32 AM  Result Value Ref Range   Alcohol, Ethyl (B) 156 (H) <10 mg/dL    Comment: (NOTE) Lowest detectable limit for serum alcohol is 10 mg/dL.  For medical purposes only. Performed at [facility], [place] [address], [place], [place] [address]   Lipid panel     Status: None   Collection Time: 06/27/22  5:32 AM  Result Value Ref Range   Cholesterol 158 0 - 200 mg/dL   Triglycerides 77 <150 mg/dL   HDL 59 >40 mg/dL   Total CHOL/HDL Ratio 2.7 RATIO   VLDL 15 0 - 40 mg/dL   LDL Cholesterol 84 0 - 99 mg/dL    Comment:        Total Cholesterol/HDL:CHD Risk Coronary Heart Disease Risk Table                     Men   Women  1/2 Average Risk   3.4   3.3  Average Risk       5.0   4.4  2 X Average Risk   9.6   7.1  3 X Average Risk  23.4   11.0        Use the calculated Patient Ratio above and the CHD Risk Table to determine the patient's CHD Risk.        ATP III  CLASSIFICATION (LDL):  <100     mg/dL   Optimal  100-129  mg/dL   Near or Above                    Optimal  130-159  mg/dL   Borderline  160-189  mg/dL   High  >190     mg/dL   Very High Performed at [facility] [address], [place], [place] [address]   TSH     Status: None   Collection Time: 06/27/22  5:32 AM  Result Value Ref Range   TSH 2.468 0.350 - 4.500 uIU/mL    Comment: Performed by a 3rd Generation assay with a functional sensitivity of <=0.01 uIU/mL. Performed at [facility], [place] [address], [place], [place] [address]   POC SARS Coronavirus 2 Ag     Status: None   Collection Time: 06/27/22  5:40 AM  Result Value Ref Range   SARSCOV2ONAVIRUS 2 AG NEGATIVE NEGATIVE    Comment: (NOTE) SARS-CoV-2 antigen NOT DETECTED.   Negative results are presumptive.  Negative results do not preclude SARS-CoV-2 infection and should not be used as the sole basis for treatment or other patient management decisions,  including infection  control decisions, particularly in the presence of clinical signs and  symptoms consistent with COVID-19, or in those who have been in contact with the virus.  Negative results must be combined with clinical observations, patient history, and epidemiological information. The expected result is Negative.  Fact Sheet for Patients: HandmadeRecipes.com.cy  Fact Sheet for Healthcare Providers: FuneralLife.at  This test is not yet approved or cleared by the [place] FDA and  has been authorized for detection and/or diagnosis of SARS-CoV-2 by FDA under an Emergency Use Authorization (EUA).  This EUA will remain in effect (meaning this test can be used) for the duration of  the COV ID-19 declaration under Section 564(b)(1) of the Act, 21 U.S.C. section 360bbb-3(b)(1), unless the authorization is terminated or revoked sooner.    POC urine preg, ED     Status: None   Collection Time: 06/27/22  5:55 AM  Result Value Ref Range   Preg Test, Ur Negative Negative  POCT Urine Drug Screen - (I-Screen)     Status: Abnormal   Collection Time: 06/27/22  5:55 AM  Result Value Ref Range   POC Amphetamine UR None Detected NONE DETECTED (Cut Off Level 1000 ng/mL)   POC Secobarbital (BAR) None Detected NONE DETECTED (Cut Off Level 300 ng/mL)   POC Buprenorphine (BUP) None Detected NONE DETECTED (Cut Off Level 10 ng/mL)   POC Oxazepam (BZO) Positive (A) NONE DETECTED (Cut Off Level 300 ng/mL)   POC Cocaine UR None Detected NONE DETECTED (Cut Off Level 300 ng/mL)   POC Methamphetamine UR None Detected NONE DETECTED (Cut Off Level 1000 ng/mL)   POC Morphine None Detected NONE DETECTED (Cut Off Level 300 ng/mL)   POC Methadone UR None Detected NONE DETECTED (Cut Off Level 300 ng/mL)   POC Oxycodone UR None Detected NONE DETECTED (Cut Off Level 100 ng/mL)   POC Marijuana UR Positive (A) NONE DETECTED (Cut Off Level 50 ng/mL)    Blood  Alcohol level:  Lab Results  Component Value Date   ETH 156 (H) 06/27/2022   ETH 153 (H) [id number]    Metabolic Disorder Labs:  Lab Results  Component Value Date   HGBA1C 5.2 06/27/2022   MPG 103 06/27/2022   MPG 102.54 07/12/2020  No results found for: "PROLACTIN" Lab Results  Component Value Date   CHOL 158 06/27/2022   TRIG 77 06/27/2022   HDL 59 06/27/2022   CHOLHDL 2.7 06/27/2022   VLDL 15 06/27/2022   LDLCALC 84 06/27/2022   LDLCALC 73 07/12/2020    Current Medications: Current Facility-Administered Medications  Medication Dose Route Frequency Provider Last Rate Last Admin   acetaminophen (TYLENOL) tablet 650 mg  650 mg Oral Q6H PRN Revonda Humphrey, NP   650 mg at 06/27/22 1713   alum & mag hydroxide-simeth (MAALOX/MYLANTA) 200-200-20 MG/5ML suspension 30 mL  30 mL Oral Q4H PRN Revonda Humphrey, NP       busPIRone (BUSPAR) tablet 10 mg  10 mg Oral TID Revonda Humphrey, NP   10 mg at 06/27/22 1710   clonazePAM (KLONOPIN) tablet 1 mg  1 mg Oral TID Asheton Viramontes, Madie Reno, MD       diphenhydrAMINE (BENADRYL) capsule 50 mg  50 mg Oral Q6H PRN Otelia Hettinger, Madie Reno, MD   50 mg at 06/28/22 1126   Or   diphenhydrAMINE (BENADRYL) injection 50 mg  50 mg Intramuscular Q6H PRN Huxton Glaus, Madie Reno, MD       feeding supplement (ENSURE ENLIVE / ENSURE PLUS) liquid 237 mL  237 mL Oral BID BM Ethie Curless T, MD   237 mL at 06/28/22 1001   gabapentin (NEURONTIN) capsule 200 mg  200 mg Oral TID Revonda Humphrey, NP   200 mg at 06/27/22 1710   haloperidol (HALDOL) tablet 5 mg  5 mg Oral Q6H PRN Kingsly Kloepfer, Madie Reno, MD       Or   haloperidol lactate (HALDOL) injection 5 mg  5 mg Intramuscular Q6H PRN Sherill Mangen, Madie Reno, MD       hydrOXYzine (ATARAX) tablet 25 mg  25 mg Oral Q6H PRN Revonda Humphrey, NP   25 mg at 06/28/22 1122   loperamide (IMODIUM) capsule 2-4 mg  2-4 mg Oral PRN Revonda Humphrey, NP   4 mg at 06/28/22 0641   LORazepam (ATIVAN) tablet 1 mg  1 mg Oral Q6H PRN Revonda Humphrey,  NP   1 mg at 06/28/22 1122   LORazepam (ATIVAN) tablet 1 mg  1 mg Oral QID Revonda Humphrey, NP   1 mg at 06/28/22 [id number]   Followed by   LORazepam (ATIVAN) tablet 1 mg  1 mg Oral TID Revonda Humphrey, NP       Followed by   Derrill Memo ON 06/29/2022] LORazepam (ATIVAN) tablet 1 mg  1 mg Oral BID Revonda Humphrey, NP       Followed by   Derrill Memo ON 07/01/2022] LORazepam (ATIVAN) tablet 1 mg  1 mg Oral Daily Revonda Humphrey, NP       LORazepam (ATIVAN) tablet 2 mg  2 mg Oral Q6H PRN Jimmylee Ratterree, Madie Reno, MD       magnesium hydroxide (MILK OF MAGNESIA) suspension 30 mL  30 mL Oral Daily PRN Revonda Humphrey, NP       midazolam (VERSED) injection 4 mg  4 mg Intramuscular Q6H PRN Zubin Pontillo, Madie Reno, MD       nicotine (NICODERM CQ - dosed in mg/24 hours) patch 21 mg  21 mg Transdermal Daily Amish Mintzer T, MD       nicotine polacrilex (NICORETTE) gum 2 mg  2 mg Oral PRN Marcheta Horsey, Madie Reno, MD       ondansetron (ZOFRAN-ODT) disintegrating tablet 4 mg  4 mg  Oral Q6H PRN Revonda Humphrey, NP       pantoprazole (PROTONIX) EC tablet 40 mg  40 mg Oral Daily Revonda Humphrey, NP       prazosin (MINIPRESS) capsule 2 mg  2 mg Oral QHS Jeronica Stlouis, Madie Reno, MD       sertraline (ZOLOFT) tablet 50 mg  50 mg Oral Daily Revonda Humphrey, NP       traZODone (DESYREL) tablet 50 mg  50 mg Oral QHS PRN Revonda Humphrey, NP       ziprasidone (GEODON) injection 20 mg  20 mg Intramuscular Q12H PRN Ceili Boshers, Madie Reno, MD       PTA Medications: Medications Prior to Admission  Medication Sig Dispense Refill Last Dose   albuterol (VENTOLIN HFA) 108 (90 Base) MCG/ACT inhaler Inhale 2 puffs into the lungs every 6 (six) hours as needed for wheezing or shortness of breath. 8.5 g 1    busPIRone (BUSPAR) 10 MG tablet Take 1 tablet (10 mg total) by mouth 3 (three) times daily.      gabapentin (NEURONTIN) 100 MG capsule Take 2 capsules (200 mg total) by mouth 3 (three) times daily.      pantoprazole (PROTONIX) 40 MG tablet Take 1 tablet  (40 mg total) by mouth daily.      prazosin (MINIPRESS) 1 MG capsule Take 1 capsule (1 mg total) by mouth at bedtime.      sertraline (ZOLOFT) 50 MG tablet Take 50 mg by mouth daily.      traZODone (DESYREL) 100 MG tablet Take 0.5 tablets (50 mg total) by mouth at bedtime as needed for sleep. Patient to take take full tablet (100 mg total) if sleeping disturbances persist. 30 tablet 1     Musculoskeletal: Strength & Muscle Tone: within normal limits Gait & Station: normal Patient leans: N/A            Psychiatric Specialty Exam:  Presentation  General Appearance:  Disheveled  Eye Contact: Fair  Speech: Clear and Coherent; Normal Rate  Speech Volume: Normal  Handedness: Right   Mood and Affect  Mood: Anxious; Labile  Affect: Congruent   Thought Process  Thought Processes: Coherent  Duration of Psychotic Symptoms:N/A Past Diagnosis of Schizophrenia or Psychoactive disorder: No  Descriptions of Associations:Intact  Orientation:Full (Time, Place and Person)  Thought Content:Logical  Hallucinations:Hallucinations: None  Ideas of Reference:None  Suicidal Thoughts:Suicidal Thoughts: No  Homicidal Thoughts:Homicidal Thoughts: No   Sensorium  Memory: Immediate Good; Recent Good; Remote Good  Judgment: Poor  Insight: Fair   Community education officer  Concentration: Good  Attention Span: Good  Recall: Good  Fund of Knowledge: Good  Language: Good   Psychomotor Activity  Psychomotor Activity: Psychomotor Activity: Normal   Assets  Assets: Communication Skills; Desire for Improvement; Financial Resources/Insurance   Sleep  Sleep: Sleep: Fair    Physical Exam: Physical Exam Vitals reviewed.  Constitutional:      Appearance: Normal appearance.  HENT:     Head: Normocephalic and atraumatic.     Mouth/Throat:     Pharynx: Oropharynx is clear.  Eyes:     Pupils: Pupils are equal, round, and reactive to light.   Cardiovascular:     Rate and Rhythm: Normal rate and regular rhythm.  Pulmonary:     Effort: Pulmonary effort is normal.     Breath sounds: Normal breath sounds.  Abdominal:     General: Abdomen is flat.     Palpations: Abdomen is soft.  Musculoskeletal:  General: Normal range of motion.  Skin:    General: Skin is warm and dry.  Neurological:     General: No focal deficit present.     Mental Status: She is alert. Mental status is at baseline.  Psychiatric:        Attention and Perception: She is inattentive.        Mood and Affect: Mood is depressed. Affect is labile, angry and inappropriate.        Speech: She is noncommunicative. Speech is rapid and pressured and tangential.        Behavior: Behavior is agitated, aggressive and hyperactive.        Thought Content: Thought content is paranoid. Thought content includes suicidal ideation. Thought content does not include suicidal plan.        Cognition and Memory: Cognition is impaired.        Judgment: Judgment is inappropriate.    Review of Systems  Constitutional:  Positive for malaise/fatigue.  HENT: Negative.    Eyes: Negative.   Respiratory: Negative.    Cardiovascular: Negative.   Gastrointestinal: Negative.   Musculoskeletal: Negative.   Skin: Negative.   Neurological: Negative.   Psychiatric/Behavioral:  Positive for depression, substance abuse and suicidal ideas. Negative for hallucinations. The patient is nervous/anxious and has insomnia.    Blood pressure (!) 146/95, pulse 98, temperature 98.3 F (36.8 C), temperature source Oral, resp. rate 19, height 5\' 2"  (1.575 m), weight 56.7 kg, SpO2 97 %. Body mass index is 22.86 kg/m.  Treatment Plan Summary: Daily contact with patient to assess and evaluate symptoms and progress in treatment, Medication management, and Plan patient escalated dramatically during the interview.  Towards the end of the conversation she began to insist that she could sign out of the  hospital voluntarily.  I tried to explain the commitment situation which just got her more escalated.  Requiring medication at this point.  Patient will be restarted on the medicine she was prescribed in [place].  A dilemma is what to do about her benzodiazepine dependence.  She makes it very clear she has no desire to quit and no intention of quitting.  She has no interest or wish to engage in substance abuse treatment.  We do not want her to have a seizure or psychotic withdrawal symptoms.  I am going to give her a milligram of Klonopin 3 times a day for now.  We also are going to need lots of as needed medicines for the moment.  Ongoing assessment of dangerousness prior to discharge  Observation Level/Precautions:  15 minute checks  Laboratory:  UDS  Psychotherapy:    Medications:    Consultations:    Discharge Concerns:    Estimated LOS:  Other:     Physician Treatment Plan for Primary Diagnosis: Borderline personality disorder ([place]) Long Term Goal(s): Improvement in symptoms so as ready for discharge  Short Term Goals: Stabilize mood and behavior and physical symptoms  Physician Treatment Plan for Secondary Diagnosis: Principal Problem:   Borderline personality disorder ([place]) Active Problems:   Benzodiazepine dependence, continuous ([place])   Alcohol use disorder   Substance induced mood disorder ([place])  Long Term Goal(s): Improvement in symptoms so as ready for discharge  Short Term Goals: Ability to verbalize feelings will improve, Ability to disclose and discuss suicidal ideas, Ability to demonstrate self-control will improve, and Ability to identify and develop effective coping behaviors will improve  I certify that inpatient services furnished can reasonably be expected to improve the  patient's condition.    Alethia Berthold, MD 3/20/202411:31 AM

## 2022-06-28 NOTE — Group Note (Signed)
Recreation Therapy Group Note   Group Topic:Problem Solving  Group Date: 06/28/2022 Start Time: 1000 End Time: 1055 Facilitators: Vilma Prader, LRT, CTRS Location:  Craft Room  Group Description: Life Boat. Patients were given the scenario that they are on a boat that is about to become shipwrecked, leaving them stranded on an [place]. They are asked to make a list of 15 different items that they want to take with them when they are stranded on the [place]. Patients are asked to rank their items from most important to least important, #1 being the most important and #15 being the least. Patients will work individually for the first round to come up with 15 items and then pair up with a peer(s) to condense their list and come up with one list of 15 items between the two of them. Patients or LRT will read aloud the 15 different items to the group after each round. LRT facilitated post-activity processing to discuss how this activity can be used in daily life post discharge.   Affect/Mood: N/A   Participation Level: Did not attend    Clinical Observations/Individualized Feedback: Waunda did not attend group due to resting in her room.  Plan: Continue to engage patient in RT group sessions 2-3x/week.   Vilma Prader, LRT, CTRS 06/28/2022 11:14 AM
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
## 2022-06-28 NOTE — BHH Counselor (Signed)
CSW unable to complete PSA with patient.  Patient asleep following medication for agitation.  Assunta Curtis, MSW, LCSW 06/28/2022 3:34 PM

## 2022-06-29 ENCOUNTER — Encounter (HOSPITAL_COMMUNITY): Payer: Self-pay

## 2022-06-29 ENCOUNTER — Telehealth (HOSPITAL_COMMUNITY): Payer: No Payment, Other | Admitting: Physician Assistant

## 2022-06-29 DIAGNOSIS — F603 Borderline personality disorder: Secondary | ICD-10-CM | POA: Diagnosis not present

## 2022-06-29 MED ORDER — LORAZEPAM 2 MG PO TABS
2.0000 mg | ORAL_TABLET | Freq: Four times a day (QID) | ORAL | Status: DC | PRN
  Administered 2022-06-29: 2 mg via ORAL
  Filled 2022-06-29: qty 1

## 2022-06-29 NOTE — Group Note (Signed)
LCSW Group Therapy Note  Group Date: 06/29/2022 Start Time: 1300 End Time: 1400   Type of Therapy and Topic:  Group Therapy - Healthy vs Unhealthy Coping Skills  Participation Level:  Did Not Attend   Description of Group The focus of this group was to determine what unhealthy coping techniques typically are used by group members and what healthy coping techniques would be helpful in coping with various problems. Patients were guided in becoming aware of the differences between healthy and unhealthy coping techniques. Patients were asked to identify 2-3 healthy coping skills they would like to learn to use more effectively.  Therapeutic Goals Patients learned that coping is what human beings do all day long to deal with various situations in their lives Patients defined and discussed healthy vs unhealthy coping techniques Patients identified their preferred coping techniques and identified whether these were healthy or unhealthy Patients determined 2-3 healthy coping skills they would like to become more familiar with and use more often. Patients provided support and ideas to each other   Summary of Patient Progress:   Patient did not attend group despite encouraged participation.    Therapeutic Modalities Cognitive Behavioral Therapy Motivational [place], [place] 06/29/2022  2:29 PM

## 2022-06-29 NOTE — Group Note (Signed)
Recreation Therapy Group Note   Group Topic:Relaxation  Group Date: 06/29/2022 Start Time: 1000 End Time: 1050 Facilitators: Vilma Prader, LRT, CTRS Location:  Dayroom  Group Description: PMR (Progressive Muscle Relaxation). LRT asks patients their current level of stress/anxiety from 1-10, with 10 being the highest. LRT educates patients on what PMR is and the benefits that come from it. Patients are asked to sit with their feet flat on the floor while sitting up and all the way back in their chair, if possible. LRT follows prompt that requires the patients to tense and release different muscles in their body and focus on their breathing. During session, lights are off and soft music is being played. At the end of the prompt, LRT asks patients to rank their current levels of stress/anxiety from 1-10, 10 being the highest.   Affect/Mood: N/A   Participation Level: Did not attend    Clinical Observations/Individualized Feedback: Azlyn did not attend group due to resting in her room.  Plan: Continue to engage patient in RT group sessions 2-3x/week.   Vilma Prader, LRT, CTRS 06/29/2022 11:17 AM

## 2022-06-29 NOTE — BHH Counselor (Signed)
CSW and NP student Loree Fee, RN completed patient's assessment.  As this Probation officer and NP student walked away from patient, patient could be heard yelling "what's funny?", "do you think my life is funny?" from inside of her room. Patient then exited room and continued to scream and use profanity while posturing.   De-escalation attempts were made by MHTs and security.  CSW and NP student did not engage at this time for safety concerns and thoughts that patient would escalate further.    Patient did deescalate and asked for her PRN medications.  CSW notes that there was no laughter, CSW and NP student discussed an upcoming assignment of NP student, no PHI or HIPAA protected information shared, nothing of detail, except NP students comment that she would write an upcoming assignment and CSW supporting NP student in doing so.  CSW unsure of where the assumption of laughter had come from.  CSW further notes that patient became agitated at the end of assessment stating that she wanted to go home and CSW attempted to engage patient in discussion on behaviors and safety plans that would support a safe discharge.  Patient did not appear able to process the conversation or perhaps was unwilling to do so, it was unclear, CSW terminated the conversation due to increasing agitation, though at this time patient remained verbal.  Conversation terminated without incident.  Assunta Curtis, MSW, LCSW 06/29/2022 11:42 AM

## 2022-06-29 NOTE — Group Note (Signed)
Date:  06/29/2022 Time:  9:42 AM  Group Topic/Focus:  Goals Group:   The focus of this group is to help patients establish daily goals to achieve during treatment and discuss how the patient can incorporate goal setting into their daily lives to aide in recovery. Community Meeting    Participation Level:  Did Not Attend   Adela Lank [facility] 06/29/2022, 9:42 AM
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
## 2022-06-29 NOTE — Plan of Care (Signed)
  Problem: Education: Goal: Knowledge of General Education information will improve Description: Including pain rating scale, medication(s)/side effects and non-pharmacologic comfort measures Outcome: Progressing   Problem: Coping: Goal: Level of anxiety will decrease Outcome: Progressing   Problem: Safety: Goal: Ability to remain free from injury will improve Outcome: Progressing   Problem: Education: Goal: Emotional status will improve Outcome: Not Progressing   
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
## 2022-06-29 NOTE — Progress Notes (Signed)
[facility] MD Progress Note  06/29/2022 10:44 AM [name]  MRN:  [id number] Subjective: Patient seen for follow-up.  42 year old woman with chronic mood instability characteristic of borderline personality disorder combined with chronic substance abuse problems probable PTSD.  Patient calm down a bit overnight and came to me this morning apologizing but clearly still jittery and anxious.  She was asking if she could be discharged.  I asked her if she could describe to me what her plan was if she left here and where she was going.  She was not able to come up with any sort of reasonable plan.  She told me she would refuse to go to a homeless shelter and that if she went here she would "just walk".  She acknowledges that she has no place to go.  I told her that did not sound like a safe thoughtful plan and that perhaps it would be better for her to give it some time to see if she can come up with a better idea. Principal Problem: Borderline personality disorder ([place]) Diagnosis: Principal Problem:   Borderline personality disorder ([place]) Active Problems:   Benzodiazepine dependence, continuous (HCC)   Alcohol use disorder   Substance induced mood disorder (HCC)  Total Time spent with patient: 30 minutes  Past Psychiatric History: Past history of chronic mood instability chronic anxiety substance abuse multiple hospitalizations  Past Medical History:  Past Medical History:  Diagnosis Date   Anxiety    Benzodiazepine abuse ([place])    Bipolar 1 disorder ([place])    Depression    Drug abuse ([place])    ETOH abuse    Polysubstance abuse ([place])    History reviewed. No pertinent surgical history. Family History:  Family History  Problem Relation Age of Onset   Drug abuse Mother    Anxiety disorder Mother    Anxiety disorder Father    Drug abuse Maternal Aunt    Alcohol abuse Maternal Grandfather    Drug abuse Cousin    Family Psychiatric  History: See previous Social History:  Social History    Substance and Sexual Activity  Alcohol Use Yes   Comment: rarely-past etoh abuse     Social History   Substance and Sexual Activity  Drug Use Yes   Types: Marijuana, Benzodiazepines   Comment: Xanax; Klonopin    Social History   Socioeconomic History   Marital status: Single    Spouse name: Not on file   Number of children: 0   Years of education: Not on file   Highest education level: 11th grade  Occupational History   Not on file  Tobacco Use   Smoking status: Every Day    Packs/day: 1.50    Years: 17.00    Additional pack years: 0.00    Total pack years: 25.50    Types: Cigarettes   Smokeless tobacco: Never  Vaping Use   Vaping Use: Never used  Substance and Sexual Activity   Alcohol use: Yes    Comment: rarely-past etoh abuse   Drug use: Yes    Types: Marijuana, Benzodiazepines    Comment: Xanax; Klonopin   Sexual activity: Not Currently  Other Topics Concern   Not on file  Social History Narrative   Not on file   Social Determinants of Health   Financial Resource Strain: Not on file  Food Insecurity: Food Insecurity Present (06/27/2022)   Hunger Vital Sign    Worried About Running Out of Food in the Last Year: Often true  Ran Out of Food in the Last Year: Often true  Transportation Needs: Unmet Transportation Needs (06/27/2022)   PRAPARE - Hydrologist (Medical): Yes    Lack of Transportation (Non-Medical): Yes  Physical Activity: Not on file  Stress: Not on file  Social Connections: Not on file   Additional Social History:                         Sleep: Fair  Appetite:  Fair  Current Medications: Current Facility-Administered Medications  Medication Dose Route Frequency Provider Last Rate Last Admin   acetaminophen (TYLENOL) tablet 650 mg  650 mg Oral Q6H PRN Revonda Humphrey, NP   650 mg at 06/27/22 1713   alum & mag hydroxide-simeth (MAALOX/MYLANTA) 200-200-20 MG/5ML suspension 30 mL  30 mL Oral  Q4H PRN Revonda Humphrey, NP       busPIRone (BUSPAR) tablet 10 mg  10 mg Oral TID Revonda Humphrey, NP   10 mg at 06/29/22 0800   clonazePAM (KLONOPIN) tablet 1 mg  1 mg Oral TID Davine Sweney, Madie Reno, MD   1 mg at 06/29/22 0800   feeding supplement (ENSURE ENLIVE / ENSURE PLUS) liquid 237 mL  237 mL Oral BID BM Byan Poplaski T, MD   237 mL at 06/29/22 0830   gabapentin (NEURONTIN) capsule 200 mg  200 mg Oral TID Revonda Humphrey, NP   200 mg at 06/29/22 0800   hydrOXYzine (ATARAX) tablet 25 mg  25 mg Oral Q6H PRN Revonda Humphrey, NP   25 mg at 06/29/22 0800   loperamide (IMODIUM) capsule 2-4 mg  2-4 mg Oral PRN Revonda Humphrey, NP   4 mg at 06/28/22 0641   LORazepam (ATIVAN) tablet 2 mg  2 mg Oral Q6H PRN Jynesis Nakamura, Madie Reno, MD   2 mg at 06/29/22 1029   magnesium hydroxide (MILK OF MAGNESIA) suspension 30 mL  30 mL Oral Daily PRN Revonda Humphrey, NP       midazolam (VERSED) injection 4 mg  4 mg Intramuscular Q6H PRN Lysander Calixte, Madie Reno, MD       nicotine (NICODERM CQ - dosed in mg/24 hours) patch 21 mg  21 mg Transdermal Daily Ahlayah Tarkowski T, MD   21 mg at 06/29/22 0800   nicotine polacrilex (NICORETTE) gum 2 mg  2 mg Oral PRN Nataley Bahri, Madie Reno, MD       OLANZapine (ZYPREXA) injection 10 mg  10 mg Intramuscular Q6H PRN Koven Belinsky, Madie Reno, MD       ondansetron (ZOFRAN-ODT) disintegrating tablet 4 mg  4 mg Oral Q6H PRN Revonda Humphrey, NP       pantoprazole (PROTONIX) EC tablet 40 mg  40 mg Oral Daily Revonda Humphrey, NP   40 mg at 06/29/22 0800   prazosin (MINIPRESS) capsule 2 mg  2 mg Oral QHS Rachna Schonberger T, MD       sertraline (ZOLOFT) tablet 50 mg  50 mg Oral Daily Revonda Humphrey, NP   50 mg at 06/29/22 0800   traZODone (DESYREL) tablet 50 mg  50 mg Oral QHS PRN Revonda Humphrey, NP       ziprasidone (GEODON) injection 20 mg  20 mg Intramuscular Q12H PRN Shaunee Mulkern, Madie Reno, MD        Lab Results: No results found for this or any previous visit (from the past 77 hour(s)).  Blood  Alcohol level:  Lab Results  Component Value Date   ETH 156 (H) 06/27/2022   ETH 153 (H) 123XX123    Metabolic Disorder Labs: Lab Results  Component Value Date   HGBA1C 5.2 06/27/2022   MPG 103 06/27/2022   MPG 102.54 07/12/2020   No results found for: "PROLACTIN" Lab Results  Component Value Date   CHOL 158 06/27/2022   TRIG 77 06/27/2022   HDL 59 06/27/2022   CHOLHDL 2.7 06/27/2022   VLDL 15 06/27/2022   LDLCALC 84 06/27/2022   LDLCALC 73 07/12/2020    Physical Findings: AIMS: Facial and Oral Movements Muscles of Facial Expression: None, normal Lips and Perioral Area: None, normal Jaw: None, normal Tongue: None, normal,Extremity Movements Upper (arms, wrists, hands, fingers): None, normal Lower (legs, knees, ankles, toes): None, normal, Trunk Movements Neck, shoulders, hips: None, normal, Overall Severity Severity of abnormal movements (highest score from questions above): None, normal Incapacitation due to abnormal movements: None, normal Patient's awareness of abnormal movements (rate only patient's report): No Awareness, Dental Status Current problems with teeth and/or dentures?: No Does patient usually wear dentures?: No  CIWA:  CIWA-Ar Total: 13 COWS:     Musculoskeletal: Strength & Muscle Tone: within normal limits Gait & Station: normal Patient leans: N/A  Psychiatric Specialty Exam:  Presentation  General Appearance:  Disheveled  Eye Contact: Fair  Speech: Clear and Coherent; Normal Rate  Speech Volume: Normal  Handedness: Right   Mood and Affect  Mood: Anxious; Labile  Affect: Congruent   Thought Process  Thought Processes: Coherent  Descriptions of Associations:Intact  Orientation:Full (Time, Place and Person)  Thought Content:Logical  History of Schizophrenia/Schizoaffective disorder:No  Duration of Psychotic Symptoms:No data recorded Hallucinations:No data recorded Ideas of Reference:None  Suicidal Thoughts:No  data recorded Homicidal Thoughts:No data recorded  Sensorium  Memory: Immediate Good; Recent Good; Remote Good  Judgment: Poor  Insight: Fair   Community education officer  Concentration: Good  Attention Span: Good  Recall: Good  Fund of Knowledge: Good  Language: Good   Psychomotor Activity  Psychomotor Activity:No data recorded  Assets  Assets: Communication Skills; Desire for Improvement; Financial Resources/Insurance   Sleep  Sleep:No data recorded   Physical Exam: Physical Exam Vitals reviewed.  Constitutional:      Appearance: Normal appearance.  HENT:     Head: Normocephalic and atraumatic.     Mouth/Throat:     Pharynx: Oropharynx is clear.  Eyes:     Pupils: Pupils are equal, round, and reactive to light.  Cardiovascular:     Rate and Rhythm: Normal rate and regular rhythm.  Pulmonary:     Effort: Pulmonary effort is normal.     Breath sounds: Normal breath sounds.  Abdominal:     General: Abdomen is flat.     Palpations: Abdomen is soft.  Musculoskeletal:        General: Normal range of motion.  Skin:    General: Skin is warm and dry.  Neurological:     General: No focal deficit present.     Mental Status: She is alert. Mental status is at baseline.  Psychiatric:        Attention and Perception: She is inattentive.        Mood and Affect: Affect is labile, angry and inappropriate.        Speech: Speech is tangential.        Behavior: Behavior is agitated.        Cognition and Memory: Cognition is impaired. Memory is impaired.  Judgment: Judgment is impulsive and inappropriate.    Review of Systems  Constitutional: Negative.   HENT: Negative.    Eyes: Negative.   Respiratory: Negative.    Cardiovascular: Negative.   Gastrointestinal: Negative.   Musculoskeletal: Negative.   Skin: Negative.   Neurological: Negative.   Psychiatric/Behavioral:  Negative for suicidal ideas. The patient is nervous/anxious.    Blood pressure  113/75, pulse 98, temperature 99 F (37.2 C), temperature source Oral, resp. rate 18, height 5\' 2"  (1.575 m), weight 56.7 kg, SpO2 97 %. Body mass index is 22.86 kg/m.   Treatment Plan Summary: Medication management and Plan since she is here I think it is incumbent on [place] to try and treat her with medication as appropriate and also to help her to come up with some kind of safe discharge plan.  So far she has resisted this and when I tried to discuss it with her she once again loses her temper.  Shortly after speaking with me she was back out in the hallway shouting because she was frustrated about something that happened in a telephone call.  Clearly at this point to agitated and not capable of making reasonable decisions.  Continue medication and continue trying to engage in individual and group therapy and trying to engage her one-to-one to discuss discharge planning  Alethia Berthold, MD 06/29/2022, 10:45 AM

## 2022-06-29 NOTE — Plan of Care (Signed)
  Problem: Education: Goal: Knowledge of General Education information will improve Description: Including pain rating scale, medication(s)/side effects and non-pharmacologic comfort measures Outcome: Progressing   Problem: Nutrition: Goal: Adequate nutrition will be maintained Outcome: Progressing   Problem: Activity: Goal: Risk for activity intolerance will decrease Outcome: Not Progressing   Problem: Coping: Goal: Level of anxiety will decrease Outcome: Not Progressing   Problem: Education: Goal: Emotional status will improve Outcome: Not Progressing Goal: Mental status will improve Outcome: Not Progressing

## 2022-06-29 NOTE — [facility] Suicide Risk Assessment (Signed)
[place] INPATIENT:  Family/Significant Other Suicide Prevention Education  Suicide Prevention Education:  Patient Refusal for Family/Significant Other Suicide Prevention Education: The patient [name] has refused to provide written consent for family/significant other to be provided Family/Significant Other Suicide Prevention Education during admission and/or prior to discharge.  Physician notified.  SPE completed with pt, as pt refused to consent to family contact. SPI pamphlet provided to pt and pt was encouraged to share information with support network, ask questions, and talk about any concerns relating to SPE. Pt denies access to guns/firearms and verbalized understanding of information provided. Mobile Crisis information also provided to pt.   Rozann Lesches 06/29/2022, 4:02 PM

## 2022-06-29 NOTE — Progress Notes (Signed)
Pt denies SI/HI/AVH and verbally agrees to approach staff if these become apparent or before harming themselves/others. Rates depression 10/10. Rates anxiety 10/10. Rates pain 0/10. Pt has been in her room for most of the day. Pt was tearful when administering meds. Pt was heard yelling at SW when they were doing her assessment. Pt states that she is most worried about her animals. Scheduled medications administered to pt, per MD orders. RN provided support and encouragement to pt. Q15 min safety checks implemented and continued. Pt is safe on the unit. Plan of care on going and no other concerns expressed at this time.  06/29/22 0800  Psych Admission Type (Psych Patients Only)  Admission Status Involuntary  Psychosocial Assessment  Patient Complaints Anxiety;Depression;Crying spells  Eye Contact Brief  Facial Expression Anxious;Sullen;Worried  Affect Anxious;Depressed;Sad  Speech Pressured;Rapid  Interaction Defensive  Motor Activity Slow;Fidgety  Appearance/Hygiene Poor hygiene  Behavior Characteristics Cooperative;Anxious;Fidgety  Mood Depressed;Anxious  Aggressive Behavior  Effect No apparent injury  Thought Process  Coherency Concrete thinking  Content Blaming others;Preoccupation  Delusions None reported or observed  Perception WDL  Hallucination None reported or observed  Judgment Limited  Confusion Severe  Danger to Self  Current suicidal ideation? Denies  Danger to Others  Danger to Others None reported or observed

## 2022-06-29 NOTE — [facility] Group Notes (Signed)
[place] Group Notes:  (Nursing/MHT/Case Management/Adjunct)  Date:  06/29/2022  Time:  8:27 PM  Type of Therapy:   Wrap up  Participation Level:  Did Not Attend  Summary of Progress/Problems:  [name] 06/29/2022, 8:27 PM

## 2022-06-30 ENCOUNTER — Other Ambulatory Visit: Payer: Self-pay

## 2022-06-30 DIAGNOSIS — F603 Borderline personality disorder: Secondary | ICD-10-CM | POA: Diagnosis not present

## 2022-06-30 MED ORDER — NICOTINE POLACRILEX 2 MG MT GUM
2.0000 mg | CHEWING_GUM | OROMUCOSAL | 0 refills | Status: DC | PRN
  Filled 2022-06-30: qty 110, 25d supply, fill #0

## 2022-06-30 MED ORDER — NICOTINE 21 MG/24HR TD PT24: 21.0000 mg | MEDICATED_PATCH | Freq: Every day | TRANSDERMAL | 1 refills | Status: DC

## 2022-06-30 MED ORDER — ALBUTEROL SULFATE HFA 108 (90 BASE) MCG/ACT IN AERS: 2.0000 | INHALATION_SPRAY | Freq: Four times a day (QID) | RESPIRATORY_TRACT | 1 refills | Status: DC | PRN

## 2022-06-30 MED ORDER — GABAPENTIN 400 MG PO CAPS
400.0000 mg | ORAL_CAPSULE | Freq: Three times a day (TID) | ORAL | 0 refills | Status: DC
  Filled 2022-06-30: qty 90, 30d supply, fill #0

## 2022-06-30 MED ORDER — PRAZOSIN HCL 2 MG PO CAPS
2.0000 mg | ORAL_CAPSULE | Freq: Every day | ORAL | 0 refills | Status: DC
  Filled 2022-06-30: qty 30, 30d supply, fill #0

## 2022-06-30 MED ORDER — TRAZODONE HCL 50 MG PO TABS: 50.0000 mg | ORAL_TABLET | Freq: Every evening | ORAL | 1 refills | Status: DC | PRN

## 2022-06-30 MED ORDER — ALBUTEROL SULFATE HFA 108 (90 BASE) MCG/ACT IN AERS
2.0000 | INHALATION_SPRAY | Freq: Four times a day (QID) | RESPIRATORY_TRACT | 0 refills | Status: AC | PRN
  Filled 2022-06-30: qty 8.5, 30d supply, fill #0

## 2022-06-30 MED ORDER — NICOTINE 21 MG/24HR TD PT24
21.0000 mg | MEDICATED_PATCH | Freq: Every day | TRANSDERMAL | 0 refills | Status: DC
  Filled 2022-06-30: qty 28, 28d supply, fill #0

## 2022-06-30 MED ORDER — BUSPIRONE HCL 10 MG PO TABS
10.0000 mg | ORAL_TABLET | Freq: Three times a day (TID) | ORAL | 0 refills | Status: DC
  Filled 2022-06-30: qty 90, 30d supply, fill #0

## 2022-06-30 MED ORDER — PANTOPRAZOLE SODIUM 40 MG PO TBEC: 40.0000 mg | DELAYED_RELEASE_TABLET | Freq: Every day | ORAL | 1 refills | Status: DC

## 2022-06-30 MED ORDER — GABAPENTIN 400 MG PO CAPS: 400.0000 mg | ORAL_CAPSULE | Freq: Three times a day (TID) | ORAL | 1 refills | Status: DC

## 2022-06-30 MED ORDER — GABAPENTIN 400 MG PO CAPS
400.0000 mg | ORAL_CAPSULE | Freq: Three times a day (TID) | ORAL | Status: DC
  Administered 2022-06-30: 400 mg via ORAL
  Filled 2022-06-30: qty 1

## 2022-06-30 MED ORDER — NICOTINE POLACRILEX 2 MG MT GUM: 2.0000 mg | CHEWING_GUM | OROMUCOSAL | 1 refills | Status: DC | PRN

## 2022-06-30 MED ORDER — PRAZOSIN HCL 2 MG PO CAPS: 2.0000 mg | ORAL_CAPSULE | Freq: Every day | ORAL | 1 refills | Status: DC

## 2022-06-30 MED ORDER — SERTRALINE HCL 50 MG PO TABS
50.0000 mg | ORAL_TABLET | Freq: Every day | ORAL | 0 refills | Status: DC
  Filled 2022-06-30: qty 30, 30d supply, fill #0

## 2022-06-30 MED ORDER — TRAZODONE HCL 50 MG PO TABS
50.0000 mg | ORAL_TABLET | Freq: Every evening | ORAL | 0 refills | Status: DC | PRN
  Filled 2022-06-30: qty 30, 30d supply, fill #0

## 2022-06-30 MED ORDER — BUSPIRONE HCL 10 MG PO TABS: 10.0000 mg | ORAL_TABLET | Freq: Three times a day (TID) | ORAL | 1 refills | Status: DC

## 2022-06-30 MED ORDER — PANTOPRAZOLE SODIUM 40 MG PO TBEC
40.0000 mg | DELAYED_RELEASE_TABLET | Freq: Every day | ORAL | 0 refills | Status: AC
  Filled 2022-06-30: qty 30, 30d supply, fill #0

## 2022-06-30 MED ORDER — SERTRALINE HCL 50 MG PO TABS: 50.0000 mg | ORAL_TABLET | Freq: Every day | ORAL | 1 refills | Status: DC

## 2022-06-30 NOTE — Discharge Summary (Signed)
Physician Discharge Summary Note  Patient:  [name] is an 42 y.o., female MRN:  [id number] DOB:  March 10, 1981 Patient phone:  [phone] (home)  Patient address:   [place] [address],  Total Time spent with patient: 30 minutes  Date of Admission:  06/27/2022 Date of Discharge: 06/30/2022  Reason for Admission: Patient was admitted because of suicidal threats involving a gun with emotional instability in the context of chronic mood problems and substance use  Principal Problem: Borderline personality disorder [facility]) Discharge Diagnoses: Principal Problem:   Borderline personality disorder ([place]) Active Problems:   Benzodiazepine dependence, continuous ([place])   Alcohol use disorder   Substance induced mood disorder ([place])   Past Psychiatric History: Longstanding history of mood instability behavior problems substance abuse  Past Medical History:  Past Medical History:  Diagnosis Date   Anxiety    Benzodiazepine abuse ([place])    Bipolar 1 disorder ([place])    Depression    Drug abuse ([place])    ETOH abuse    Polysubstance abuse ([place])    History reviewed. No pertinent surgical history. Family History:  Family History  Problem Relation Age of Onset   Drug abuse Mother    Anxiety disorder Mother    Anxiety disorder Father    Drug abuse Maternal Aunt    Alcohol abuse Maternal Grandfather    Drug abuse Cousin    Family Psychiatric  History: See previous.  Substance abuse and mood disorder and family Social History:  Social History   Substance and Sexual Activity  Alcohol Use Yes   Comment: rarely-past etoh abuse     Social History   Substance and Sexual Activity  Drug Use Yes   Types: Marijuana, Benzodiazepines   Comment: Xanax; Klonopin    Social History   Socioeconomic History   Marital status: Single    Spouse name: Not on file   Number of children: 0   Years of education: Not on file   Highest education level: 11th grade   Occupational History   Not on file  Tobacco Use   Smoking status: Every Day    Packs/day: 1.50    Years: 17.00    Additional pack years: 0.00    Total pack years: 25.50    Types: Cigarettes   Smokeless tobacco: Never  Vaping Use   Vaping Use: Never used  Substance and Sexual Activity   Alcohol use: Yes    Comment: rarely-past etoh abuse   Drug use: Yes    Types: Marijuana, Benzodiazepines    Comment: Xanax; Klonopin   Sexual activity: Not Currently  Other Topics Concern   Not on file  Social History Narrative   Not on file   Social Determinants of Health   Financial Resource Strain: Not on file  Food Insecurity: Food Insecurity Present (06/27/2022)   Hunger Vital Sign    Worried About Running Out of Food in the Last Year: Often true    Ran Out of Food in the Last Year: Often true  Transportation Needs: Unmet Transportation Needs (06/27/2022)   PRAPARE - Hydrologist (Medical): Yes    Lack of Transportation (Non-Medical): Yes  Physical Activity: Not on file  Stress: Not on file  Social Connections: Not on file    Hospital Course: Patient admitted to psychiatric unit.  Because of her history of longstanding Xanax abuse and her unwillingness to commit to stopping it she was continued on modest dose clonazepam while in  the hospital to prevent withdrawal.  Other psychiatric medicines were continued.  Patient was interviewed individually and in treatment team and engaged in individual and group therapy.  She did not display dangerous behavior in the hospital.  Patient was requesting discharge and by the last day pleading for discharge because she was worried about her pets.  Patient completely denied suicidal ideation and did not show any evidence of psychosis.  She was agreeable to continued medication.  She was educated about the dangers that continued substance abuse involved and strongly encouraged to engage herself in a plan to stop Xanax abuse and  other substance abuse and get follow-up mental health treatment.  She will be given a supply of medicine and prescriptions at discharge and referred for outpatient treatment.  Patient states that she will be able to find a place to stay with a friend or go to a shelter if needed.  Physical Findings: AIMS: Facial and Oral Movements Muscles of Facial Expression: None, normal Lips and Perioral Area: None, normal Jaw: None, normal Tongue: None, normal,Extremity Movements Upper (arms, wrists, hands, fingers): None, normal Lower (legs, knees, ankles, toes): None, normal, Trunk Movements Neck, shoulders, hips: None, normal, Overall Severity Severity of abnormal movements (highest score from questions above): None, normal Incapacitation due to abnormal movements: None, normal Patient's awareness of abnormal movements (rate only patient's report): No Awareness, Dental Status Current problems with teeth and/or dentures?: No Does patient usually wear dentures?: No  CIWA:  CIWA-Ar Total: 13 COWS:     Musculoskeletal: Strength & Muscle Tone: within normal limits Gait & Station: normal Patient leans: N/A   Psychiatric Specialty Exam:  Presentation  General Appearance:  Disheveled  Eye Contact: Fair  Speech: Clear and Coherent; Normal Rate  Speech Volume: Normal  Handedness: Right   Mood and Affect  Mood: Anxious; Labile  Affect: Congruent   Thought Process  Thought Processes: Coherent  Descriptions of Associations:Intact  Orientation:Full (Time, Place and Person)  Thought Content:Logical  History of Schizophrenia/Schizoaffective disorder:No  Duration of Psychotic Symptoms:No data recorded Hallucinations:No data recorded Ideas of Reference:None  Suicidal Thoughts:No data recorded Homicidal Thoughts:No data recorded  Sensorium  Memory: Immediate Good; Recent Good; Remote Good  Judgment: Poor  Insight: Fair   Community education officer   Concentration: Good  Attention Span: Good  Recall: Good  Fund of Knowledge: Good  Language: Good   Psychomotor Activity  Psychomotor Activity:No data recorded  Assets  Assets: Communication Skills; Desire for Improvement; Financial Resources/Insurance   Sleep  Sleep:No data recorded   Physical Exam: Physical Exam Vitals and nursing note reviewed.  Constitutional:      Appearance: Normal appearance.  HENT:     Head: Normocephalic and atraumatic.     Mouth/Throat:     Pharynx: Oropharynx is clear.  Eyes:     Pupils: Pupils are equal, round, and reactive to light.  Cardiovascular:     Rate and Rhythm: Normal rate and regular rhythm.  Pulmonary:     Effort: Pulmonary effort is normal.     Breath sounds: Normal breath sounds.  Abdominal:     General: Abdomen is flat.     Palpations: Abdomen is soft.  Musculoskeletal:        General: Normal range of motion.  Skin:    General: Skin is warm and dry.  Neurological:     General: No focal deficit present.     Mental Status: She is alert. Mental status is at baseline.  Psychiatric:  Attention and Perception: Attention normal.        Mood and Affect: Mood is anxious.        Speech: Speech normal.        Behavior: Behavior is cooperative.        Thought Content: Thought content normal.        Cognition and Memory: Cognition normal.    Review of Systems  Constitutional: Negative.   HENT: Negative.    Eyes: Negative.   Respiratory: Negative.    Cardiovascular: Negative.   Gastrointestinal: Negative.   Musculoskeletal: Negative.   Skin: Negative.   Neurological: Negative.   Psychiatric/Behavioral:  Negative for hallucinations and suicidal ideas. The patient is nervous/anxious.    Blood pressure (!) 127/100, pulse (!) 106, temperature 98 F (36.7 C), temperature source Oral, resp. rate 18, height 5\' 2"  (1.575 m), weight 56.7 kg, SpO2 99 %. Body mass index is 22.86 kg/m.   Social History    Tobacco Use  Smoking Status Every Day   Packs/day: 1.50   Years: 17.00   Additional pack years: 0.00   Total pack years: 25.50   Types: Cigarettes  Smokeless Tobacco Never   Tobacco Cessation:  A prescription for an FDA-approved tobacco cessation medication provided at discharge   Blood Alcohol level:  Lab Results  Component Value Date   ETH 156 (H) 06/27/2022   ETH 153 (H) 123XX123    Metabolic Disorder Labs:  Lab Results  Component Value Date   HGBA1C 5.2 06/27/2022   MPG 103 06/27/2022   MPG 102.54 07/12/2020   No results found for: "PROLACTIN" Lab Results  Component Value Date   CHOL 158 06/27/2022   TRIG 77 06/27/2022   HDL 59 06/27/2022   CHOLHDL 2.7 06/27/2022   VLDL 15 06/27/2022   LDLCALC 84 06/27/2022   LDLCALC 73 07/12/2020    See Psychiatric Specialty Exam and Suicide Risk Assessment completed by Attending Physician prior to discharge.  Discharge destination:  Home  Is patient on multiple antipsychotic therapies at discharge:  No   Has Patient had three or more failed trials of antipsychotic monotherapy by history:  No  Recommended Plan for Multiple Antipsychotic Therapies: NA  Discharge Instructions     Diet - low sodium heart healthy   Complete by: As directed    Increase activity slowly   Complete by: As directed       Allergies as of 06/30/2022       Reactions   Haldol [haloperidol Lactate] Other (See Comments)   Makes whole body stiff        Medication List     TAKE these medications      Indication  albuterol 108 (90 Base) MCG/ACT inhaler Commonly known as: VENTOLIN HFA Inhale 2 puffs into the lungs every 6 (six) hours as needed for wheezing or shortness of breath.  Indication: Chronic Obstructive Lung Disease   busPIRone 10 MG tablet Commonly known as: BUSPAR Take 1 tablet (10 mg total) by mouth 3 (three) times daily.  Indication: Anxiety Disorder   gabapentin 400 MG capsule Commonly known as: NEURONTIN Take 1  capsule (400 mg total) by mouth 3 (three) times daily. What changed:  medication strength how much to take  Indication: Generalized Anxiety Disorder, Agitation   nicotine 21 mg/24hr patch Commonly known as: NICODERM CQ - dosed in mg/24 hours Place 1 patch (21 mg total) onto the skin daily. Start taking on: July 01, 2022  Indication: Nicotine Addiction   nicotine polacrilex 2  MG gum Commonly known as: NICORETTE Take 1 each (2 mg total) by mouth as needed for smoking cessation.  Indication: Nicotine Addiction   pantoprazole 40 MG tablet Commonly known as: PROTONIX Take 1 tablet (40 mg total) by mouth daily.  Indication: Gastroesophageal Reflux Disease, Acid reflux   prazosin 2 MG capsule Commonly known as: MINIPRESS Take 1 capsule (2 mg total) by mouth at bedtime. What changed:  medication strength how much to take  Indication: Frightening Dreams   sertraline 50 MG tablet Commonly known as: ZOLOFT Take 1 tablet (50 mg total) by mouth daily.  Indication: Major Depressive Disorder   traZODone 50 MG tablet Commonly known as: DESYREL Take 1 tablet (50 mg total) by mouth at bedtime as needed for sleep. What changed:  medication strength additional instructions  Indication: Trouble Sleeping         Follow-up recommendations:  Other:  Prescription and medications given at discharge  Comments: See above  Signed: Alethia Berthold, MD 06/30/2022, 10:47 AM

## 2022-06-30 NOTE — Plan of Care (Signed)
  Problem: Education: Goal: Knowledge of General Education information will improve Description: Including pain rating scale, medication(s)/side effects and non-pharmacologic comfort measures Outcome: Progressing   Problem: Activity: Goal: Risk for activity intolerance will decrease Outcome: Progressing   Problem: Nutrition: Goal: Adequate nutrition will be maintained Outcome: Progressing   Problem: Coping: Goal: Level of anxiety will decrease Outcome: Progressing   Problem: Education: Goal: Emotional status will improve Outcome: Progressing Goal: Mental status will improve Outcome: Progressing   Problem: Clinical Measurements: Goal: Cardiovascular complication will be avoided Outcome: Not Progressing

## 2022-06-30 NOTE — Progress Notes (Signed)
Discharge note: Suicide safety plan and survey complete. RN met with pt and reviewed pt's discharge instructions. Pt verbalized understanding of discharge instructions and pt did not have any questions. RN reviewed and provided pt with a copy of SRA, AVS and Transition Record. RN returned pt's belongings to pt. Prescriptions and samples were given to pt. Pt denied SI/HI/AVH and voiced no concerns. Pt was appreciative of the care pt received at [facility]. Patient discharged to the lobby without incident.  06/30/22 [id number]  Psych Admission Type (Psych Patients Only)  Admission Status Involuntary  Psychosocial Assessment  Patient Complaints Anxiety;Depression  Eye Contact Fair  Facial Expression Worried;Anxious  Affect Anxious;Preoccupied  Speech Logical/coherent  Interaction Assertive  Motor Activity Slow  Appearance/Hygiene Unremarkable  Behavior Characteristics Cooperative;Appropriate to situation;Restless  Mood Depressed;Anxious  Aggressive Behavior  Effect No apparent injury  Thought Process  Coherency Concrete thinking  Content Blaming others;Preoccupation  Delusions None reported or observed  Perception WDL  Hallucination None reported or observed  Judgment Limited  Confusion None  Danger to Self  Current suicidal ideation? Denies  Danger to Others  Danger to Others None reported or observed

## 2022-06-30 NOTE — BHH Suicide Risk Assessment (Signed)
[facility] Discharge Suicide Risk Assessment   Principal Problem: Borderline personality disorder [facility]) Discharge Diagnoses: Principal Problem:   Borderline personality disorder ([place]) Active Problems:   Benzodiazepine dependence, continuous ([place])   Alcohol use disorder   Substance induced mood disorder ([place])   Total Time spent with patient: 30 minutes  Musculoskeletal: Strength & Muscle Tone: within normal limits Gait & Station: normal Patient leans: N/A  Psychiatric Specialty Exam  Presentation  General Appearance:  Disheveled  Eye Contact: Fair  Speech: Clear and Coherent; Normal Rate  Speech Volume: Normal  Handedness: Right   Mood and Affect  Mood: Anxious; Labile  Duration of Depression Symptoms: -- (patient refused)  Affect: Congruent   Thought Process  Thought Processes: Coherent  Descriptions of Associations:Intact  Orientation:Full (Time, Place and Person)  Thought Content:Logical  History of Schizophrenia/Schizoaffective disorder:No  Duration of Psychotic Symptoms:No data recorded Hallucinations:No data recorded Ideas of Reference:None  Suicidal Thoughts:No data recorded Homicidal Thoughts:No data recorded  Sensorium  Memory: Immediate Good; Recent Good; Remote Good  Judgment: Poor  Insight: Fair   Community education officer  Concentration: Good  Attention Span: Good  Recall: Good  Fund of Knowledge: Good  Language: Good   Psychomotor Activity  Psychomotor Activity:No data recorded  Assets  Assets: Communication Skills; Desire for Improvement; Financial Resources/Insurance   Sleep  Sleep:No data recorded  Physical Exam: Physical Exam Vitals and nursing note reviewed.  Constitutional:      Appearance: Normal appearance.  HENT:     Head: Normocephalic and atraumatic.     Mouth/Throat:     Pharynx: Oropharynx is clear.  Eyes:     Pupils: Pupils are equal, round, and reactive to light.  Cardiovascular:      Rate and Rhythm: Normal rate and regular rhythm.  Pulmonary:     Effort: Pulmonary effort is normal.     Breath sounds: Normal breath sounds.  Abdominal:     General: Abdomen is flat.     Palpations: Abdomen is soft.  Musculoskeletal:        General: Normal range of motion.  Skin:    General: Skin is warm and dry.  Neurological:     General: No focal deficit present.     Mental Status: She is alert. Mental status is at baseline.  Psychiatric:        Attention and Perception: Attention normal.        Mood and Affect: Mood normal. Affect is labile.        Thought Content: Thought content normal.    Review of Systems  Constitutional: Negative.   HENT: Negative.    Eyes: Negative.   Respiratory: Negative.    Cardiovascular: Negative.   Gastrointestinal: Negative.   Musculoskeletal: Negative.   Skin: Negative.   Neurological: Negative.   Psychiatric/Behavioral:  Negative for suicidal ideas. The patient is nervous/anxious.    Blood pressure (!) 127/100, pulse (!) 106, temperature 98 F (36.7 C), temperature source Oral, resp. rate 18, height 5\' 2"  (1.575 m), weight 56.7 kg, SpO2 99 %. Body mass index is 22.86 kg/m.  Mental Status Per Nursing Assessment::   On Admission:  NA  Demographic Factors:  Caucasian and Living alone  Loss Factors: Financial problems/change in socioeconomic status  Historical Factors: Impulsivity  Risk Reduction Factors:   NA  Continued Clinical Symptoms:  Severe Anxiety and/or Agitation Depression:   Comorbid alcohol abuse/dependence Personality Disorders:   Cluster B  Cognitive Features That Contribute To Risk:  Thought constriction (tunnel vision)  Suicide Risk:  Minimal: No identifiable suicidal ideation.  Patients presenting with no risk factors but with morbid ruminations; may be classified as minimal risk based on the severity of the depressive symptoms    Plan Of Care/Follow-up recommendations:  Other:  Patient is currently  back to her baseline.  Still some irritability but is able to calm down.  No sign of psychosis.  Denies suicidal thought intent or plan.  Begging for discharge because she needs to take care of her animals.  Patient has received counseling about follow-up treatment and working on managing her suicidal ideation.  No further benefit from inpatient treatment she will be discharged today.  Alethia Berthold, MD 06/30/2022, 10:38 AM

## 2022-06-30 NOTE — Progress Notes (Signed)
  [facility] Adult Case Management Discharge Plan :  Will you be returning to the same living situation after discharge:  No. Patient to discharge to [facility].  At discharge, do you have transportation home?: Yes,  CSW provided patient with transportation voucher to [facility] at [place]  Do you have the ability to pay for your medications: No. Patient has no listed insurance, to be provided with 7 day supply of medication at discharge. CSW has provided patient with clinic information for free/reduced medications.   Release of information consent forms completed and in the chart;  Patient's signature needed at discharge.  Patient to Follow up at:  [facility]. Go on 07/03/2022.   Specialty: Behavioral Health Why: Please pressent for new patient walk in clinic Mon - Fri at [place]. Clinic operates on a first come first serve basis; clinic starts seeing patient at 0800AM. Contact information: [place] [phone]                Next level of care provider has access to [place] and Suicide Prevention discussed: Yes,  SPE completed with patient by nuresing staff, declined consent for CSW to reach family/friend.      Has patient been referred to the Quitline?: Patient refused referral Tobacco Use: High Risk (06/27/2022)   Patient History    Smoking Tobacco Use: Every Day    Smokeless Tobacco Use: Never    Passive Exposure: Not on file   Patient has been referred for addiction treatment: Pt. refused referral Social History   Substance and Sexual Activity  Drug Use Yes   Types: Marijuana, Benzodiazepines   Comment: Xanax; Klonopin   Social History   Substance and Sexual Activity  Alcohol Use Yes   Comment: rarely-past etoh abuse   Durenda Hurt, Latanya Presser 06/30/2022, 12:25 PM

## 2022-06-30 NOTE — Group Note (Signed)
Recreation Therapy Group Note   Group Topic:Self-Esteem  Group Date: 06/30/2022 Start Time: 1000 End Time: 1105 Facilitators: Vilma Prader, LRT, CTRS Location:  [facility]  Group Description: Patients and LRT discussed the importance of self-love and self-esteem. Pt completed a worksheet that helps them identify 24 different strengths and qualities about themselves. Pt encouraged to read aloud at least 3 off their sheet to the group. LRT and pts discussed how this can be applied to daily life post-discharge.  Pt's then played "Positive Affirmation Bingo" afterwards, with journals or stress balls as bingo prizes.   Affect/Mood: N/A   Participation Level: Did not attend    Clinical Observations/Individualized Feedback: Lu did not attend group due to resting in her room.  Plan: Continue to engage patient in RT group sessions 2-3x/week.   Vilma Prader, LRT, CTRS 06/30/2022 11:34 AM

## 2022-07-12 ENCOUNTER — Telehealth (HOSPITAL_COMMUNITY): Payer: No Payment, Other | Admitting: Physician Assistant
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
## 2022-07-14 ENCOUNTER — Telehealth (INDEPENDENT_AMBULATORY_CARE_PROVIDER_SITE_OTHER): Payer: No Payment, Other | Admitting: Psychiatry

## 2022-07-14 ENCOUNTER — Other Ambulatory Visit: Payer: Self-pay

## 2022-07-14 ENCOUNTER — Encounter (HOSPITAL_COMMUNITY): Payer: Self-pay | Admitting: Psychiatry

## 2022-07-14 DIAGNOSIS — F419 Anxiety disorder, unspecified: Secondary | ICD-10-CM

## 2022-07-14 DIAGNOSIS — F1090 Alcohol use, unspecified, uncomplicated: Secondary | ICD-10-CM | POA: Diagnosis not present

## 2022-07-14 DIAGNOSIS — F1994 Other psychoactive substance use, unspecified with psychoactive substance-induced mood disorder: Secondary | ICD-10-CM

## 2022-07-14 DIAGNOSIS — F1121 Opioid dependence, in remission: Secondary | ICD-10-CM | POA: Diagnosis not present

## 2022-07-14 DIAGNOSIS — F1421 Cocaine dependence, in remission: Secondary | ICD-10-CM

## 2022-07-14 DIAGNOSIS — F603 Borderline personality disorder: Secondary | ICD-10-CM | POA: Diagnosis not present

## 2022-07-14 DIAGNOSIS — F1320 Sedative, hypnotic or anxiolytic dependence, uncomplicated: Secondary | ICD-10-CM

## 2022-07-14 DIAGNOSIS — F4310 Post-traumatic stress disorder, unspecified: Secondary | ICD-10-CM

## 2022-07-14 MED ORDER — GABAPENTIN 400 MG PO CAPS
400.0000 mg | ORAL_CAPSULE | Freq: Three times a day (TID) | ORAL | 2 refills | Status: DC
  Filled 2022-07-14 – 2022-07-25 (×4): qty 90, 30d supply, fill #0
  Filled 2022-10-03: qty 90, 30d supply, fill #1
  Filled 2022-11-08: qty 90, 30d supply, fill #2

## 2022-07-14 MED ORDER — SERTRALINE HCL 50 MG PO TABS
50.0000 mg | ORAL_TABLET | Freq: Every day | ORAL | 2 refills | Status: DC
  Filled 2022-07-14 – 2022-07-25 (×4): qty 30, 30d supply, fill #0
  Filled 2022-10-03: qty 30, 30d supply, fill #1
  Filled 2022-11-08: qty 30, 30d supply, fill #2

## 2022-07-14 MED ORDER — PRAZOSIN HCL 2 MG PO CAPS
2.0000 mg | ORAL_CAPSULE | Freq: Every day | ORAL | 2 refills | Status: DC
  Filled 2022-07-14 – 2022-07-25 (×4): qty 30, 30d supply, fill #0
  Filled 2022-10-03: qty 30, 30d supply, fill #1
  Filled 2022-11-08: qty 30, 30d supply, fill #2

## 2022-07-14 MED ORDER — HYDROXYZINE HCL 25 MG PO TABS
25.0000 mg | ORAL_TABLET | Freq: Two times a day (BID) | ORAL | 2 refills | Status: AC | PRN
  Filled 2022-07-14: qty 60, 30d supply, fill #0
  Filled 2022-10-03: qty 60, 30d supply, fill #1
  Filled 2022-11-08: qty 60, 30d supply, fill #2

## 2022-07-14 MED ORDER — BUSPIRONE HCL 10 MG PO TABS
10.0000 mg | ORAL_TABLET | Freq: Three times a day (TID) | ORAL | 2 refills | Status: AC
  Filled 2022-07-14 – 2022-07-25 (×4): qty 90, 30d supply, fill #0
  Filled 2022-10-03: qty 90, 30d supply, fill #1

## 2022-07-14 NOTE — Patient Instructions (Signed)
Thank you for attending your appointment today.  -- We did not make any medication changes today. Please continue medications as prescribed.  Please do not make any changes to medications without first discussing with your provider. If you are experiencing a psychiatric emergency, please call 911 or present to your nearest emergency department. Additional crisis, medication management, and therapy resources are included below.  [facility]  [address], [place], [place] [address] [phone] WALK-IN URGENT CARE 24/7 FOR ANYONE [address], [place], [place]  [phone] Fax: [phone] [email or web address] *Interpreters available *Accepts all insurance and uninsured for Urgent Care needs *Accepts Medicaid and uninsured for outpatient treatment (below)      ONLY FOR [place] Residents  Below:    Outpatient New Patient Assessment/Therapy Walk-ins:        Monday -Thursday 8am until slots are full.        Every Friday 1pm-4pm  (first come, first served)                   New Patient Psychiatry/Medication Management        Monday-Friday 8am-11am (first come, first served)               For all walk-ins we ask that you arrive by 7:15am, because patients will be seen in the order of arrival.   

## 2022-07-17 ENCOUNTER — Other Ambulatory Visit: Payer: Self-pay

## 2022-07-19 ENCOUNTER — Other Ambulatory Visit: Payer: Self-pay

## 2022-07-19 ENCOUNTER — Other Ambulatory Visit (HOSPITAL_COMMUNITY): Payer: Self-pay

## 2022-07-20 ENCOUNTER — Other Ambulatory Visit: Payer: Self-pay

## 2022-07-20 ENCOUNTER — Other Ambulatory Visit (HOSPITAL_COMMUNITY): Payer: Self-pay

## 2022-07-20 ENCOUNTER — Encounter (HOSPITAL_COMMUNITY): Payer: Self-pay

## 2022-07-25 ENCOUNTER — Other Ambulatory Visit: Payer: Self-pay

## 2022-08-09 ENCOUNTER — Other Ambulatory Visit: Payer: Self-pay

## 2022-08-24 ENCOUNTER — Telehealth (HOSPITAL_COMMUNITY): Payer: Self-pay | Admitting: Licensed Clinical Social Worker

## 2022-08-24 ENCOUNTER — Ambulatory Visit (HOSPITAL_COMMUNITY): Payer: 59 | Admitting: Licensed Clinical Social Worker

## 2022-08-24 NOTE — Progress Notes (Signed)
Patient did not connect for virtual psychiatric medication management appointment on 08/25/22 at 11AM. Sent secure video link with no response. Called phone and patient stated she had forgotten about appointment and was at work. She requested to reschedule and was rescheduled with this writer for 09/11/22 at 11AM. Denied any immediate needs or concerns; denied need for medication refills.   Daine Gip, MD 09/05/22  This encounter was created in error - please disregard.

## 2022-08-24 NOTE — Telephone Encounter (Signed)
Called pt after two links sent for 0900 appointment with no response. Phone rang and went straight to VM. HIPAA compliant VM left. Pt to be marked as no show for today

## 2022-08-25 ENCOUNTER — Encounter (HOSPITAL_COMMUNITY): Payer: 59 | Admitting: Psychiatry

## 2022-08-25 ENCOUNTER — Ambulatory Visit (HOSPITAL_COMMUNITY): Payer: 59 | Admitting: Licensed Clinical Social Worker

## 2022-08-25 ENCOUNTER — Encounter (HOSPITAL_COMMUNITY): Payer: Self-pay

## 2022-09-08 ENCOUNTER — Other Ambulatory Visit: Payer: Self-pay

## 2022-09-08 NOTE — Progress Notes (Unsigned)
Patient did not connect for virtual psychiatric medication management appointment on 09/11/22 at 11AM. Sent secure video link with no response. Called phone and went straight to VM; unable to leave VM as mailbox full. As patient has had over 3 no shows with this clinic, patient will need to present in person during walk-in hours in order to receive continued psychiatric management.    Daine Gip, MD 09/11/22   This encounter was created in error - please disregard.

## 2022-09-11 ENCOUNTER — Encounter (HOSPITAL_COMMUNITY): Payer: 59 | Admitting: Psychiatry

## 2022-09-11 ENCOUNTER — Encounter (HOSPITAL_COMMUNITY): Payer: Self-pay

## 2022-10-03 ENCOUNTER — Other Ambulatory Visit: Payer: Self-pay | Admitting: General Practice

## 2022-10-03 ENCOUNTER — Other Ambulatory Visit: Payer: Self-pay

## 2022-10-03 ENCOUNTER — Telehealth: Payer: Self-pay

## 2022-10-03 NOTE — Telephone Encounter (Signed)
Medication Refill - Medication: sertraline (ZOLOFT) 50 MG tablet [[id number]]  hydrOXYzine (ATARAX) 25 MG tablet [[id number]]  gabapentin (NEURONTIN) 400 MG capsule [[id number]]  busPIRone (BUSPAR) 10 MG tablet [[id number]]  Has the patient contacted their pharmacy? Yes.    (Agent: If yes, when and what did the pharmacy advise?) Contact PCP   Preferred Pharmacy (with phone number or street name): [facility]   Has the patient been seen for an appointment in the last year OR does the patient have an upcoming appointment? No.  Agent: Please be advised that RX refills may take up to 3 business days. We ask that you follow-up with your pharmacy.

## 2022-10-03 NOTE — Telephone Encounter (Signed)
Pt. Calling to get phone number for [facility] for refills on her medications. [phone].

## 2022-10-04 ENCOUNTER — Other Ambulatory Visit: Payer: Self-pay

## 2022-10-04 NOTE — Telephone Encounter (Signed)
Requested Prescriptions  Pending Prescriptions Disp Refills   sertraline (ZOLOFT) 50 MG tablet 30 tablet 2    Sig: Take 1 tablet (50 mg total) by mouth daily.     Psychiatry:  Antidepressants - SSRI - sertraline Failed - 10/03/2022 11:09 AM      Failed - Valid encounter within last 6 months    Recent Outpatient Visits           1 year ago Nausea in adult   [facility] Jonah Blue B, MD   2 years ago Recurrent major depressive disorder, in partial remission [facility]   [place] [facility] [place], [place], [place]   6 years ago Chronic midline low back pain with right-sided sciatica   [facility] [place], Lamont R, FNP   6 years ago Chronic midline low back pain, with sciatica presence unspecified   [facility] [place], [place] R, FNP   6 years ago Generalized anxiety disorder   [place] [place], Marylene Land M, [place]              Passed - AST in normal range and within 360 days    AST  Date Value Ref Range Status  06/27/2022 20 15 - 41 U/L Final         Passed - ALT in normal range and within 360 days    ALT  Date Value Ref Range Status  06/27/2022 16 0 - 44 U/L Final         Passed - Completed PHQ-2 or PHQ-9 in the last 360 days       hydrOXYzine (ATARAX) 25 MG tablet 60 tablet 2    Sig: Take 1 tablet (25 mg total) by mouth 2 (two) times daily as needed for anxiety (sleep).     Ear, Nose, and Throat:  Antihistamines 2 Failed - 10/03/2022 11:09 AM      Failed - Valid encounter within last 12 months    Recent Outpatient Visits           1 year ago Nausea in adult   [facility] Jonah Blue B, MD   2 years ago Recurrent major depressive disorder, in partial remission [facility])   [facility] [place], [place], [place]   6 years ago  Chronic midline low back pain with right-sided sciatica   [facility] [place], [place]og[place] R, FNP   6 years ago Chronic midline low back pain, with sciatica presence unspecified   [place] [place], [place] R, FNP   6 years ago Generalized anxiety disorder   [facility] [place], Marylene Land M, [place]              Passed - Cr in normal range and within 360 days    Creat  Date Value Ref Range Status  04/05/2016 0.53 0.50 - 1.10 mg/dL Final   Creatinine, Ser  Date Value Ref Range Status  06/27/2022 0.57 0.44 - 1.00 mg/dL Final   Creatinine,U  Date Value Ref Range Status  08/14/2008  mg/dL Final   161.0 (NOTE)  Cutoff Values for Urine Drug Screen:        Drug Class           Cutoff (ng/mL)  Amphetamines            1000        Barbiturates             200        Cocaine Metabolites      300        Benzodiazepines          200        Methadone                 300        Opiates                 2000        Phencyclidine             25        Propoxyphene             300        Marijuana Metabolites     50  For medical purposes only.          gabapentin (NEURONTIN) 400 MG capsule 90 capsule 2    Sig: Take 1 capsule (400 mg total) by mouth 3 (three) times daily.     Neurology: Anticonvulsants - gabapentin Failed - 10/03/2022 11:09 AM      Failed - Valid encounter within last 12 months    Recent Outpatient Visits           1 year ago Nausea in adult   [facility] Jonah Blue B, MD   2 years ago Recurrent major depressive disorder, in partial remission [facility])   [place] [facility] [place], [place], [place]   6 years ago Chronic midline low back pain with right-sided sciatica   [place] [facility] [place], [place] R, FNP   6 years ago Chronic midline low back pain, with sciatica presence  unspecified   [place] [facility] [place], [place] R, FNP   6 years ago Generalized anxiety disorder    [facility] [place], Marylene Land M, [place]              Passed - Cr in normal range and within 360 days    Creat  Date Value Ref Range Status  04/05/2016 0.53 0.50 - 1.10 mg/dL Final   Creatinine, Ser  Date Value Ref Range Status  06/27/2022 0.57 0.44 - 1.00 mg/dL Final   Creatinine,U  Date Value Ref Range Status  08/14/2008  mg/dL Final   098.1 (NOTE)  Cutoff Values for Urine Drug Screen:        Drug Class           Cutoff (ng/mL)        Amphetamines            1000        Barbiturates             200        Cocaine Metabolites      300        Benzodiazepines          200        Methadone                 300        Opiates  2000        Phencyclidine             25        Propoxyphene             300        Marijuana Metabolites     50  For medical purposes only.         Passed - Completed PHQ-2 or PHQ-9 in the last 360 days       busPIRone (BUSPAR) 10 MG tablet 90 tablet 2    Sig: Take 1 tablet (10 mg total) by mouth 3 (three) times daily.     Psychiatry: Anxiolytics/Hypnotics - Non-controlled Failed - 10/03/2022 11:09 AM      Failed - Valid encounter within last 12 months    Recent Outpatient Visits           1 year ago Nausea in adult   [facility] Jonah Blue B, MD   2 years ago Recurrent major depressive disorder, in partial remission [facility])   [facility] [place], [place], [place]   6 years ago Chronic midline low back pain with right-sided sciatica   [facility] [place], [place] R, FNP   6 years ago Chronic midline low back pain, with sciatica presence unspecified   [place] [place], [place] R, FNP   6 years ago Generalized anxiety disorder    [facility] [place], [place], [place]

## 2022-11-08 ENCOUNTER — Other Ambulatory Visit: Payer: Self-pay

## 2022-11-09 ENCOUNTER — Other Ambulatory Visit: Payer: Self-pay

## 2023-08-21 ENCOUNTER — Emergency Department (HOSPITAL_COMMUNITY)
Admission: EM | Admit: 2023-08-21 | Discharge: 2023-08-21 | Disposition: A | Discharge status: Other Acute Inpatient Hospital | Payer: Self-pay | Attending: Emergency Medicine | Admitting: Emergency Medicine

## 2023-08-21 ENCOUNTER — Encounter (HOSPITAL_COMMUNITY): Payer: Self-pay

## 2023-08-21 ENCOUNTER — Other Ambulatory Visit: Payer: Self-pay

## 2023-08-21 ENCOUNTER — Encounter (HOSPITAL_COMMUNITY): Payer: Self-pay | Admitting: Nurse Practitioner

## 2023-08-21 ENCOUNTER — Inpatient Hospital Stay (HOSPITAL_COMMUNITY)
Admission: AD | Admit: 2023-08-21 | Discharge: 2023-08-27 | DRG: 885 | Disposition: A | Discharge status: Home / Self Care | Source: Intra-hospital | Attending: Psychiatry | Admitting: Psychiatry

## 2023-08-21 DIAGNOSIS — F1010 Alcohol abuse, uncomplicated: Secondary | ICD-10-CM | POA: Diagnosis present

## 2023-08-21 DIAGNOSIS — Z720 Tobacco use: Secondary | ICD-10-CM | POA: Insufficient documentation

## 2023-08-21 DIAGNOSIS — R45851 Suicidal ideations: Secondary | ICD-10-CM | POA: Insufficient documentation

## 2023-08-21 DIAGNOSIS — Z813 Family history of other psychoactive substance abuse and dependence: Secondary | ICD-10-CM

## 2023-08-21 DIAGNOSIS — R232 Flushing: Secondary | ICD-10-CM | POA: Diagnosis present

## 2023-08-21 DIAGNOSIS — K219 Gastro-esophageal reflux disease without esophagitis: Secondary | ICD-10-CM | POA: Diagnosis present

## 2023-08-21 DIAGNOSIS — F410 Panic disorder [episodic paroxysmal anxiety] without agoraphobia: Secondary | ICD-10-CM | POA: Diagnosis present

## 2023-08-21 DIAGNOSIS — Z9151 Personal history of suicidal behavior: Secondary | ICD-10-CM

## 2023-08-21 DIAGNOSIS — F1210 Cannabis abuse, uncomplicated: Secondary | ICD-10-CM | POA: Diagnosis present

## 2023-08-21 DIAGNOSIS — F10120 Alcohol abuse with intoxication, uncomplicated: Secondary | ICD-10-CM | POA: Insufficient documentation

## 2023-08-21 DIAGNOSIS — F1324 Sedative, hypnotic or anxiolytic dependence with sedative, hypnotic or anxiolytic-induced mood disorder: Secondary | ICD-10-CM | POA: Diagnosis present

## 2023-08-21 DIAGNOSIS — F4310 Post-traumatic stress disorder, unspecified: Secondary | ICD-10-CM | POA: Diagnosis present

## 2023-08-21 DIAGNOSIS — R451 Restlessness and agitation: Secondary | ICD-10-CM | POA: Insufficient documentation

## 2023-08-21 DIAGNOSIS — Y906 Blood alcohol level of 120-199 mg/100 ml: Secondary | ICD-10-CM | POA: Insufficient documentation

## 2023-08-21 DIAGNOSIS — Z818 Family history of other mental and behavioral disorders: Secondary | ICD-10-CM

## 2023-08-21 DIAGNOSIS — F17210 Nicotine dependence, cigarettes, uncomplicated: Secondary | ICD-10-CM | POA: Diagnosis present

## 2023-08-21 DIAGNOSIS — F332 Major depressive disorder, recurrent severe without psychotic features: Secondary | ICD-10-CM | POA: Diagnosis present

## 2023-08-21 DIAGNOSIS — R4589 Other symptoms and signs involving emotional state: Secondary | ICD-10-CM | POA: Insufficient documentation

## 2023-08-21 DIAGNOSIS — F1290 Cannabis use, unspecified, uncomplicated: Secondary | ICD-10-CM | POA: Insufficient documentation

## 2023-08-21 DIAGNOSIS — F13988 Sedative, hypnotic or anxiolytic use, unspecified with other sedative, hypnotic or anxiolytic-induced disorder: Secondary | ICD-10-CM | POA: Insufficient documentation

## 2023-08-21 DIAGNOSIS — F1994 Other psychoactive substance use, unspecified with psychoactive substance-induced mood disorder: Secondary | ICD-10-CM | POA: Diagnosis present

## 2023-08-21 DIAGNOSIS — F17200 Nicotine dependence, unspecified, uncomplicated: Secondary | ICD-10-CM | POA: Insufficient documentation

## 2023-08-21 DIAGNOSIS — F411 Generalized anxiety disorder: Secondary | ICD-10-CM | POA: Diagnosis present

## 2023-08-21 DIAGNOSIS — F603 Borderline personality disorder: Secondary | ICD-10-CM | POA: Diagnosis present

## 2023-08-21 DIAGNOSIS — N951 Menopausal and female climacteric states: Secondary | ICD-10-CM | POA: Diagnosis present

## 2023-08-21 DIAGNOSIS — F1090 Alcohol use, unspecified, uncomplicated: Secondary | ICD-10-CM | POA: Diagnosis present

## 2023-08-21 DIAGNOSIS — Z555 Less than a high school diploma: Secondary | ICD-10-CM | POA: Diagnosis not present

## 2023-08-21 DIAGNOSIS — F1320 Sedative, hypnotic or anxiolytic dependence, uncomplicated: Secondary | ICD-10-CM | POA: Diagnosis present

## 2023-08-21 DIAGNOSIS — F1914 Other psychoactive substance abuse with psychoactive substance-induced mood disorder: Secondary | ICD-10-CM | POA: Insufficient documentation

## 2023-08-21 LAB — COMPREHENSIVE METABOLIC PANEL WITH GFR
ALT: 13 U/L (ref 0–44)
AST: 16 U/L (ref 15–41)
Albumin: 4 g/dL (ref 3.5–5.0)
Alkaline Phosphatase: 37 U/L — ABNORMAL LOW (ref 38–126)
Anion gap: 11 (ref 5–15)
BUN: 7 mg/dL (ref 6–20)
CO2: 17 mmol/L — ABNORMAL LOW (ref 22–32)
Calcium: 8.8 mg/dL — ABNORMAL LOW (ref 8.9–10.3)
Chloride: 115 mmol/L — ABNORMAL HIGH (ref 98–111)
Creatinine, Ser: 0.63 mg/dL (ref 0.44–1.00)
GFR, Estimated: >60 mL/min (ref >60)
Glucose, Bld: 83 mg/dL (ref 70–99)
Potassium: 3.4 mmol/L — ABNORMAL LOW (ref 3.5–5.1)
Sodium: 143 mmol/L (ref 135–145)
Total Bilirubin: <0.2 mg/dL (ref 0.0–1.2)
Total Protein: 6.9 g/dL (ref 6.5–8.1)

## 2023-08-21 LAB — ACETAMINOPHEN LEVEL: Acetaminophen (Tylenol), Serum: <10 ug/mL — ABNORMAL LOW (ref 10–30)

## 2023-08-21 LAB — RAPID URINE DRUG SCREEN, HOSP PERFORMED
Amphetamines: NOT DETECTED
Barbiturates: NOT DETECTED
Benzodiazepines: POSITIVE — AB
Cocaine: NOT DETECTED
Opiates: NOT DETECTED
Tetrahydrocannabinol: POSITIVE — AB

## 2023-08-21 LAB — ETHANOL: Alcohol, Ethyl (B): 172 mg/dL — ABNORMAL HIGH (ref <15)

## 2023-08-21 LAB — CBC WITH DIFFERENTIAL/PLATELET
Abs Immature Granulocytes: 0.01 10*3/uL (ref 0.00–0.07)
Basophils Absolute: 0.1 10*3/uL (ref 0.0–0.1)
Basophils Relative: 1 %
Eosinophils Absolute: 0.1 10*3/uL (ref 0.0–0.5)
Eosinophils Relative: 2 %
HCT: 40.8 % (ref 36.0–46.0)
Hemoglobin: 13.9 g/dL (ref 12.0–15.0)
Immature Granulocytes: 0 %
Lymphocytes Relative: 51 %
Lymphs Abs: 2.4 10*3/uL (ref 0.7–4.0)
MCH: 34.2 pg — ABNORMAL HIGH (ref 26.0–34.0)
MCHC: 34.1 g/dL (ref 30.0–36.0)
MCV: 100.5 fL — ABNORMAL HIGH (ref 80.0–100.0)
Monocytes Absolute: 0.3 10*3/uL (ref 0.1–1.0)
Monocytes Relative: 7 %
Neutro Abs: 1.8 10*3/uL (ref 1.7–7.7)
Neutrophils Relative %: 39 %
Platelets: 246 10*3/uL (ref 150–400)
RBC: 4.06 MIL/uL (ref 3.87–5.11)
RDW: 12 % (ref 11.5–15.5)
WBC: 4.6 10*3/uL (ref 4.0–10.5)
nRBC: 0 % (ref 0.0–0.2)

## 2023-08-21 LAB — SALICYLATE LEVEL: Salicylate Lvl: <7 mg/dL — ABNORMAL LOW (ref 7.0–30.0)

## 2023-08-21 MED ORDER — ZIPRASIDONE MESYLATE 20 MG IM SOLR
20.0000 mg | Freq: Once | INTRAMUSCULAR | Status: AC
Start: 2023-08-21 — End: 2023-08-21
  Administered 2023-08-21: 20 mg via INTRAMUSCULAR
  Filled 2023-08-21: qty 20

## 2023-08-21 MED ORDER — HYDROXYZINE HCL 25 MG PO TABS: 25.0000 mg | ORAL_TABLET | Freq: Four times a day (QID) | ORAL | Status: DC | PRN

## 2023-08-21 MED ORDER — NICOTINE 14 MG/24HR TD PT24
14.0000 mg | MEDICATED_PATCH | Freq: Every day | TRANSDERMAL | Status: DC
  Administered 2023-08-22: 14 mg via TRANSDERMAL
  Filled 2023-08-21 (×4): qty 1

## 2023-08-21 MED ORDER — LORAZEPAM 1 MG PO TABS: 1.0000 mg | ORAL_TABLET | ORAL | Status: DC | PRN

## 2023-08-21 MED ORDER — OLANZAPINE 10 MG IM SOLR: 10.0000 mg | Freq: Three times a day (TID) | INTRAMUSCULAR | Status: DC | PRN

## 2023-08-21 MED ORDER — ONDANSETRON 4 MG PO TBDP
4.0000 mg | ORAL_TABLET | Freq: Four times a day (QID) | ORAL | Status: AC | PRN
  Administered 2023-08-22: 4 mg via ORAL
  Filled 2023-08-21: qty 1

## 2023-08-21 MED ORDER — ACETAMINOPHEN 325 MG PO TABS
975.0000 mg | ORAL_TABLET | Freq: Four times a day (QID) | ORAL | Status: DC | PRN
  Administered 2023-08-24 – 2023-08-26 (×2): 975 mg via ORAL
  Filled 2023-08-21 (×3): qty 3

## 2023-08-21 MED ORDER — ALUM & MAG HYDROXIDE-SIMETH 200-200-20 MG/5ML PO SUSP: 30.0000 mL | ORAL | Status: DC | PRN

## 2023-08-21 MED ORDER — STERILE WATER FOR INJECTION IJ SOLN
INTRAMUSCULAR | Status: AC
Start: 2023-08-21 — End: 2023-08-21
  Filled 2023-08-21: qty 10

## 2023-08-21 MED ORDER — OLANZAPINE 5 MG PO TBDP: 5.0000 mg | ORAL_TABLET | Freq: Three times a day (TID) | ORAL | Status: DC | PRN

## 2023-08-21 MED ORDER — TRAZODONE HCL 50 MG PO TABS
50.0000 mg | ORAL_TABLET | Freq: Every evening | ORAL | Status: DC | PRN
  Filled 2023-08-21 (×3): qty 1

## 2023-08-21 MED ORDER — OLANZAPINE 10 MG IM SOLR: 5.0000 mg | Freq: Three times a day (TID) | INTRAMUSCULAR | Status: DC | PRN

## 2023-08-21 MED ORDER — MAGNESIUM HYDROXIDE 400 MG/5ML PO SUSP: 30.0000 mL | Freq: Every day | ORAL | Status: DC | PRN

## 2023-08-21 MED ORDER — ONDANSETRON 4 MG PO TBDP: 4.0000 mg | ORAL_TABLET | Freq: Four times a day (QID) | ORAL | Status: DC | PRN

## 2023-08-21 MED ORDER — ACETAMINOPHEN 325 MG PO TABS
975.0000 mg | ORAL_TABLET | Freq: Four times a day (QID) | ORAL | Status: DC | PRN
  Administered 2023-08-21: 975 mg via ORAL
  Filled 2023-08-21: qty 3

## 2023-08-21 MED ORDER — LORAZEPAM 1 MG PO TABS
1.0000 mg | ORAL_TABLET | ORAL | Status: DC | PRN
  Administered 2023-08-21 – 2023-08-22 (×2): 1 mg via ORAL
  Filled 2023-08-21 (×2): qty 1

## 2023-08-21 MED ORDER — HYDROXYZINE HCL 25 MG PO TABS
25.0000 mg | ORAL_TABLET | Freq: Four times a day (QID) | ORAL | Status: AC | PRN
  Administered 2023-08-21: 25 mg via ORAL
  Filled 2023-08-21: qty 1

## 2023-08-21 MED ORDER — LOPERAMIDE HCL 2 MG PO CAPS: 2.0000 mg | ORAL_CAPSULE | ORAL | Status: AC | PRN

## 2023-08-21 MED ORDER — LOPERAMIDE HCL 2 MG PO CAPS: 2.0000 mg | ORAL_CAPSULE | ORAL | Status: DC | PRN

## 2023-08-21 NOTE — Tx Team (Signed)
 Initial Treatment Plan 08/21/2023 5:07 PM [name] [id number]    PATIENT STRESSORS: Financial difficulties   Legal issue   Loss of relationship   Marital or family conflict   Substance abuse   Traumatic event     PATIENT STRENGTHS: Ability for insight  Work skills    PATIENT IDENTIFIED PROBLEMS: Suicidal ideation with the plan jump off the bridge  Substance use disorder  Hopelessness  Financial issues  Depression   Loss of significant relationship.           DISCHARGE CRITERIA:  Verbal commitment to aftercare and medication compliance Withdrawal symptoms are absent or subacute and managed without 24-hour nursing intervention  PRELIMINARY DISCHARGE PLAN: Outpatient therapy  PATIENT/FAMILY INVOLVEMENT: This treatment plan has been presented to and reviewed with the patient, [name], has been given the opportunity to ask questions and make suggestions.  Jeoffrey Mole, RN 08/21/2023, 5:07 PM

## 2023-08-21 NOTE — ED Notes (Signed)
 [employer] called, no eta given; requested call prior to arrival

## 2023-08-21 NOTE — ED Notes (Signed)
 Attempted to get EKG, pt did not want to wake up.

## 2023-08-21 NOTE — ED Triage Notes (Signed)
 BIB [employer]/ pt told manager at [facility] that she was going to jump off a bridge after being fired tonight/ pt is tearful/ ETOH/ pt is IVC'd by [employer]

## 2023-08-21 NOTE — [facility] Group Notes (Signed)
 [facility] Group Notes:  (Nursing/MHT/Case Management/Adjunct)  Date:  08/21/2023  Time: 2000  Type of Therapy:  Wrap up group  Participation Level:  Active  Participation Quality:  Appropriate, Attentive, Sharing, and Supportive  Affect:  Anxious, Depressed, and Irritable  Cognitive:  Alert  Insight:  Limited  Engagement in Group:  Developing/Improving  Modes of Intervention:  Clarification, Education, and Socialization  Summary of Progress/Problems:Positive thinking and self-care were discussed.   Catharine Clock 08/21/2023, 9:17 PM

## 2023-08-21 NOTE — ED Notes (Signed)
 Report given to Ireci, RN at 1340 from [facility]

## 2023-08-21 NOTE — Progress Notes (Signed)
 Admission note: Patient is a 43 year-old caucasian female, admitted under IVC status  from the ED for suicidal Ideation with the plan to jump off the bridge, and substance use disorder. Patient arrived to the unit via [employer] escort at 1440, awake, alert, and oriented  X's 4. Patient was willing to respond to question during admission interview with her, she presented with depressed mood and a sad affect while talking about her life's history, patient states she has so many life stressors, " I found out my ex got married to another person, I have a court case, I have financial issues, my aunt is in hospice, and I'm living with my parents." According to patient , "all these life's issues are weighing on my mind." Patient states she would like to work on how to figure out better way to cope with with anxiety instead of taking xanax  all the time. Patient states she is more worried about withdrawing from xanax  than alcohol. Patient blood alcohol level was 172 on arrival to the hospital, and she was positive for THC and Benzo. Patient denies having any auditory or visual  hallucination. Patient denies SI/HI during admission interview with her. No acute distress noted at this time.  Pt  has orientation to unit, room and routine. Information packet given to her, and safety information discussed with her.  Admission INP armband ID verified with patient, fall risk assessment completed with Patient and verbalized she understanding of risks associated with falls. No contraband found during skin assessment, Skin, clean-dry- intact without evidence of bruising, or skin tears and tracks marks. Some tatto noted on patient left ankle and forearm. Q 15 unit safety observation in place. Staff will continue to provide support to patient.

## 2023-08-21 NOTE — ED Notes (Signed)
 IVC paperwork complete, expires 08/28/23, envelope # [id number] (case number comes from clerk of court)

## 2023-08-21 NOTE — ED Notes (Signed)
 Patient at desk using  telephone

## 2023-08-21 NOTE — ED Provider Notes (Addendum)
 Emergency Medicine Observation Re-evaluation Note  [name] is a 43 y.o. female, seen on rounds today.  Pt initially presented to the ED for complaints of Suicidal Currently, the patient is calm and cooperative.  Denies HI SI  Physical Exam  BP 118/67 (BP Location: Right Arm)   Pulse (!) 114 Comment: pt crying  Temp 98 F (36.7 C) (Oral)   Resp 19   Wt 60 kg   LMP  (LMP Unknown)   SpO2 100%   BMI 24.19 kg/m  Physical Exam General: Awake. Alert. No acute distress Cardiac: Regular rate rhythm Lungs: Clear to auscultation bilaterally Psych: Calm and cooperative ED Course / MDM  EKG:   I have reviewed the labs performed to date as well as medications administered while in observation.  Recent changes in the last 24 hours include Patient brought in by police overnight for SI with plan.  She was intoxicated and agitated.  Received 20 mg IM Geodon  and slept through most of the night. This morning she is calm and cooperative.  She has taken Xanax  daily which she obtained from a friend.  I do not want her to withdraw from benzodiazepines here.  Will order as needed p.o. Ativan  for anxiety  Plan  Current plan is for continued boarding in the ED awaiting TTS for SI with plan.  1409: TTS evaluation placed.  [facility] for substance-induced mood disorder.  Attending physician is Dr. Cyndi Drain, Asenath Blacker, DO 08/21/23 2956    Sallyanne Creamer, DO 08/21/23 1409

## 2023-08-21 NOTE — ED Notes (Signed)
 Pt given Geodon  after screaming at police and staff/ pt became upset after having phone taken away/ pt threw tissue box at officer

## 2023-08-21 NOTE — Consult Note (Signed)
 [facility] Psychiatric Consult Initial  Patient Name: .[name]  MRN: [id number]  DOB: 21-Jun-1980  Consult Order details:  Orders (From admission, onward)     Start     Ordered   08/21/23 0616  CONSULT TO CALL ACT TEAM       Ordering Provider: Rory Collard, MD  Provider:  (Not yet assigned)  Question:  Reason for Consult?  Answer:  suicidal   08/21/23 0615             Mode of Visit: In person    Psychiatry Consult Evaluation  Service Date: Aug 21, 2023 LOS:  LOS: 0 days  Chief Complaint SI, substance abuse  Primary Psychiatric Diagnoses  Substance induced mood disorder 2.  Severe benzodiazepine use disorder 3.    Assessment  [name] is a 43 y.o. female admitted: Presented to the EDfor 08/21/2023 12:16 AM under IVC for suicidal ideations and intoxication. She carries the psychiatric diagnoses of MDD, substance abuse.   Please see plan below for detailed recommendations.   Diagnoses:  Active Hospital problems: Principal Problem:   Substance induced mood disorder (HCC) Active Problems:   Severe benzodiazepine use disorder (HCC)    Plan   ## Psychiatric Medication Recommendations:  - Pt denies scheduled psychotropics at this time  ## Medical Decision Making Capacity: Not specifically addressed in this encounter  ## Disposition:-- We recommend inpatient psychiatric hospitalization after medical hospitalization. Patient has been involuntarily committed on 08/20/23.   ## Behavioral / Environmental: - No specific recommendations at this time.     ## Safety and Observation Level:  - Based on my clinical evaluation, I estimate the patient to be at low risk of self harm in the current setting. - At this time, we recommend  routine. This decision is based on my review of the chart including patient's history and current presentation, interview of the patient, mental status examination, and consideration of suicide risk including  evaluating suicidal ideation, plan, intent, suicidal or self-harm behaviors, risk factors, and protective factors. This judgment is based on our ability to directly address suicide risk, implement suicide prevention strategies, and develop a safety plan while the patient is in the clinical setting. Please contact our team if there is a concern that risk level has changed.  CSSR Risk Category:C-SSRS RISK CATEGORY: Moderate Risk  Suicide Risk Assessment: Patient has following modifiable risk factors for suicide: untreated depression and recklessness, which we are addressing by inpatient admission. Patient has following non-modifiable or demographic risk factors for suicide: history of suicide attempt, history of self harm behavior, and psychiatric hospitalization Patient has the following protective factors against suicide: Access to outpatient mental health care, Supportive family, and Cultural, spiritual, or religious beliefs that discourage suicide  Thank you for this consult request. Recommendations have been communicated to the primary team.  We will recommend IP treatment at this time.   Denise Cleaver, NP       History of Present Illness  Relevant Aspects of Hospital ED Course:  Admitted on 08/21/2023  a 43 year old female who presents under police custody with involuntary commitment paperwork. Per police report, they received a message from a friend that she had texted plans to jump off a bridge. They found her in proximity to a bridge. She was belligerent and yelling. They were unable to get a story from her. For this reason they IVC to her. On my evaluation she is uncooperative, aggressive, yelling. She appears intoxicated. Unable to obtain any  useful information.   Patient Report:  Patient seen at [facility] ED for face-to-face psychiatric evaluation.  Patient is upset and slightly irritable about being in the hospital and under IVC.  She does admit to a psychiatric history of  depression, anxiety, PTSD.  She does have a history of previous suicide attempts, with the most recent being in 2018.  Patient stated since 2018 she has been doing fine.  She has been off all psychotropic medications for at least 1 year now.  She does report being depressed every day all the time but is refusing wanting to start scheduled psychotropic medications again.  She stated last night she got drunk because she found out her ex got married.  She does not remember sending any suicidal text to her friends and states she was never near a bridge.  She stated the police picked her up in a random parking lot.  She is afraid of losing her job.  She does admit to some passive SI over all the stress in her life as far as relationships and finances.  She feels she is not where she is supposed to be 43 years old.  She has low self-esteem.  She does mention being addicted to Xanax , taking anywhere between 2 to 4 mg a day for over 6 months.  She is worried about going through withdrawal in the hospital.  Denies any withdrawal seizures.  She is denying current SI.  Denies HI.  Denies AVH.  I did explain to patient she will need inpatient psychiatric treatment and she is agreeable with this plan.  Patient will remain under IVC.  Patient has been [facility] and will transfer there today.   Review of Systems  Psychiatric/Behavioral:  Positive for depression, substance abuse and suicidal ideas.   All other systems reviewed and are negative.    Psychiatric and Social History  Psychiatric History:  Information collected from patient, chart  Prev Dx/Sx: mdd, ptsd, gad Current Psych Provider: denies Home Meds (current): none Previous Med Trials: unknown Therapy: denies  Prior Psych Hospitalization: yes  Prior Self Harm: yes Prior Violence: denies   Social History:  Developmental Hx: wdl Educational Hx: high school Occupational Hx: Chiropodist Hx: current assualt charges Living Situation: lives  with parents Spiritual Hx: yes Access to weapons/lethal means: denies   Substance History Alcohol: denies  Tobacco: yes Illicit drugs: yes Prescription drug abuse: unknown Rehab hx: unknown  Exam Findings  Physical Exam:  Vital Signs:  Temp:  [98 F (36.7 C)] 98 F (36.7 C) (05/13 0029) Pulse Rate:  [114] 114 (05/13 0029) Resp:  [19] 19 (05/13 0029) BP: (118)/(67) 118/67 (05/13 0029) SpO2:  [100 %] 100 % (05/13 0029) Weight:  [60 kg] 60 kg (05/13 0029) Blood pressure 118/67, pulse (!) 114, temperature 98 F (36.7 C), temperature source Oral, resp. rate 19, weight 60 kg, SpO2 100%. Body mass index is 24.19 kg/m.  Physical Exam Vitals and nursing note reviewed.  Neurological:     Mental Status: She is alert and oriented to person, place, and time.     Mental Status Exam: General Appearance: Fairly Groomed  Orientation:  Full (Time, Place, and Person)  Memory:  Immediate;   Good Recent;   Fair  Concentration:  Concentration: Good  Recall:  Good  Attention  Fair  Eye Contact:  Fair  Speech:  Clear and Coherent  Language:  Good  Volume:  Normal  Mood: "fine"  Affect:  Depressed and Flat  Thought Process:  Goal Directed  Thought Content:  WDL and Logical  Suicidal Thoughts:  Yes.  without intent/plan  Homicidal Thoughts:  No  Judgement:  Fair  Insight:  Fair  Psychomotor Activity:  Normal  Akathisia:  No  Fund of Knowledge:  Good      Assets:  Communication Skills Housing Physical Health Resilience Social Support  Cognition:  WNL  ADL's:  Intact  AIMS (if indicated):        Other History   These have been pulled in through the EMR, reviewed, and updated if appropriate.  Family History:  The patient's family history includes Alcohol abuse in her maternal grandfather; Anxiety disorder in her father and mother; Drug abuse in her cousin, maternal aunt, and mother.  Medical History: Past Medical History:  Diagnosis Date   Anxiety    Benzodiazepine  abuse (HCC)    Bipolar 1 disorder (HCC)    Depression    Drug abuse (HCC)    ETOH abuse    Polysubstance abuse (HCC)     Surgical History: History reviewed. No pertinent surgical history.   Medications:   Current Facility-Administered Medications:    acetaminophen  (TYLENOL ) tablet 975 mg, 975 mg, Oral, Q6H PRN, Penna, Michael A, DO   LORazepam  (ATIVAN ) tablet 1 mg, 1 mg, Oral, Q4H PRN, Penna, Michael A, DO  Current Outpatient Medications:    albuterol  (VENTOLIN  HFA) 108 (90 Base) MCG/ACT inhaler, Inhale 2 puffs into the lungs every 6 (six) hours as needed for wheezing or shortness of breath., Disp: 8.5 g, Rfl: 0   gabapentin  (NEURONTIN ) 400 MG capsule, Take 1 capsule (400 mg total) by mouth 3 (three) times daily., Disp: 90 capsule, Rfl: 2   nicotine  (NICODERM CQ  - DOSED IN MG/24 HOURS) 21 mg/24hr patch, Place 1 patch (21 mg total) onto the skin daily., Disp: 28 patch, Rfl: 0   nicotine  polacrilex (NICORETTE ) 2 MG gum, Take 1 each (2 mg total) by mouth as needed for smoking cessation., Disp: 110 tablet, Rfl: 0   pantoprazole  (PROTONIX ) 40 MG tablet, Take 1 tablet (40 mg total) by mouth daily., Disp: 30 tablet, Rfl: 0   prazosin  (MINIPRESS ) 2 MG capsule, Take 1 capsule (2 mg total) by mouth at bedtime., Disp: 30 capsule, Rfl: 2   sertraline  (ZOLOFT ) 50 MG tablet, Take 1 tablet (50 mg total) by mouth daily., Disp: 30 tablet, Rfl: 2  Allergies: Allergies  Allergen Reactions   Haldol  [Haloperidol  Lactate] Other (See Comments)    Makes whole body stiff    Denise Cleaver, NP

## 2023-08-21 NOTE — Progress Notes (Signed)
 Pt has been accepted to [facility] on 08/21/2023 Bed assignment: 305-1  Pt meets inpatient criteria per: [name] NP  Attending Physician will be: Dr. Alver Jobs, MD   Report can be called [id number]: Adult unit: [phone]  Pt can arrive after discharges   Care Team Notified: [facility] Kathryn Parish RN, [name] NP, [facility] RN, [place] Dimitri Fiscus RN  [place] Mylin Hirano LCSW-A   08/21/2023 12:57 PM

## 2023-08-21 NOTE — ED Notes (Signed)
 IVC'd 08/21/23, exp 08/28/23

## 2023-08-21 NOTE — ED Provider Notes (Signed)
 [place] EMERGENCY DEPARTMENT AT [facility] Provider Note   CSN: [id number] Arrival date & time: 08/21/23  0016     History  Chief Complaint  Patient presents with   Suicidal    [name] is a 43 y.o. female.  HPI     This a 43 year old female who presents under police custody with involuntary commitment paperwork.  Per police report, they received a message from a friend that she had texted plans to jump off a bridge.  They found her in proximity to a bridge.  She was belligerent and yelling.  They were unable to get a story from her.  For this reason they IVC to her.  On my evaluation she is uncooperative, aggressive, yelling.  She appears intoxicated.  Unable to obtain any useful information.  Level 5 caveat  Home Medications Prior to Admission medications   Medication Sig Start Date End Date Taking? Authorizing Provider  albuterol  (VENTOLIN  HFA) 108 (90 Base) MCG/ACT inhaler Inhale 2 puffs into the lungs every 6 (six) hours as needed for wheezing or shortness of breath. 06/30/22   Clapacs, Elida Grounds, MD  gabapentin  (NEURONTIN ) 400 MG capsule Take 1 capsule (400 mg total) by mouth 3 (three) times daily. 07/14/22 12/09/22  Bahraini, Sarah A  nicotine  (NICODERM CQ  - DOSED IN MG/24 HOURS) 21 mg/24hr patch Place 1 patch (21 mg total) onto the skin daily. 07/01/22   Clapacs, Elida Grounds, MD  nicotine  polacrilex (NICORETTE ) 2 MG gum Take 1 each (2 mg total) by mouth as needed for smoking cessation. 06/30/22   Clapacs, Elida Grounds, MD  pantoprazole  (PROTONIX ) 40 MG tablet Take 1 tablet (40 mg total) by mouth daily. 06/30/22   Clapacs, Elida Grounds, MD  prazosin  (MINIPRESS ) 2 MG capsule Take 1 capsule (2 mg total) by mouth at bedtime. 07/14/22 12/09/22  Bahraini, Sarah A  sertraline  (ZOLOFT ) 50 MG tablet Take 1 tablet (50 mg total) by mouth daily. 07/14/22 12/09/22  Bahraini, Sarah A      Allergies    Haldol  [haloperidol  lactate]    Review of Systems   Review of Systems  Unable to  perform ROS: Acuity of condition    Physical Exam Updated Vital Signs BP 118/67 (BP Location: Right Arm)   Pulse (!) 114 Comment: pt crying  Temp 98 F (36.7 C) (Oral)   Resp 19   Wt 60 kg   LMP  (LMP Unknown)   SpO2 100%   BMI 24.19 kg/m  Physical Exam Vitals and nursing note reviewed.  Constitutional:      Appearance: She is well-developed.     Comments: Aggressive, banging her head against the wall  HENT:     Head: Normocephalic and atraumatic.     Mouth/Throat:     Mouth: Mucous membranes are moist.  Cardiovascular:     Rate and Rhythm: Normal rate and regular rhythm.  Pulmonary:     Effort: Pulmonary effort is normal. No respiratory distress.  Abdominal:     Palpations: Abdomen is soft.  Musculoskeletal:     Cervical back: Neck supple.  Skin:    General: Skin is warm and dry.  Neurological:     Mental Status: She is alert.     Comments: Appears intoxicated  Psychiatric:     Comments: Labile, aggressive, agitated     ED Results / Procedures / Treatments   Labs (all labs ordered are listed, but only abnormal results are displayed) Labs Reviewed  CBC WITH DIFFERENTIAL/PLATELET - Abnormal;  Notable for the following components:      Result Value   MCV 100.5 (*)    MCH 34.2 (*)    All other components within normal limits  COMPREHENSIVE METABOLIC PANEL WITH GFR - Abnormal; Notable for the following components:   Potassium 3.4 (*)    Chloride 115 (*)    CO2 17 (*)    Calcium  8.8 (*)    Alkaline Phosphatase 37 (*)    All other components within normal limits  ETHANOL - Abnormal; Notable for the following components:   Alcohol, Ethyl (B) 172 (*)    All other components within normal limits  RAPID URINE DRUG SCREEN, HOSP PERFORMED - Abnormal; Notable for the following components:   Benzodiazepines POSITIVE (*)    Tetrahydrocannabinol POSITIVE (*)    All other components within normal limits  ACETAMINOPHEN  LEVEL - Abnormal; Notable for the following  components:   Acetaminophen  (Tylenol ), Serum <10 (*)    All other components within normal limits  SALICYLATE LEVEL - Abnormal; Notable for the following components:   Salicylate Lvl <7.0 (*)    All other components within normal limits    EKG None  Radiology No results found.  Procedures Procedures    Medications Ordered in ED Medications  ziprasidone  (GEODON ) injection 20 mg (20 mg Intramuscular Given 08/21/23 0048)  sterile water  (preservative free) injection (  Given 08/21/23 0049)    ED Course/ Medical Decision Making/ A&P                                 Medical Decision Making Amount and/or Complexity of Data Reviewed Labs: ordered.  Risk Prescription drug management.   This patient presents to the ED for concern of suicidality, this involves an extensive number of treatment options, and is a complaint that carries with it a high risk of complications and morbidity.  I considered the following differential and admission for this acute, potentially life threatening condition.  The differential diagnosis includes suicidal, situational depression, anxiety, polysubstance abuse  MDM:    This is a 43 year old female who presents with reported suicidal ideation.  She is aggressive and quite volatile on initial evaluation.  She required sedation with Geodon  as she was hitting her head against a wall.  No loss of consciousness or vomiting noted.  Not on any blood thinners.  Labs obtained and reviewed.  Largely reassuring.  EKG reassuring.  Patient is now awake and alert.  She is ambulatory.  Will plan for TTS evaluation.  First exam on the chart.  (Labs, imaging, consults)  Labs: I Ordered, and personally interpreted labs.  The pertinent results include: Psych labs  Imaging Studies ordered: I ordered imaging studies including none I independently visualized and interpreted imaging. I agree with the radiologist interpretation  Additional history obtained from chart  review.  External records from outside source obtained and reviewed including prior evaluations  Cardiac Monitoring: The patient was maintained on a cardiac monitor.  If on the cardiac monitor, I personally viewed and interpreted the cardiac monitored which showed an underlying rhythm of: Sinus  Reevaluation: After the interventions noted above, I reevaluated the patient and found that they have :stayed the same  Social Determinants of Health:  polysubstance abuse, alcohol  Disposition: TTS evaluation  Co morbidities that complicate the patient evaluation  Past Medical History:  Diagnosis Date   Anxiety    Benzodiazepine abuse (HCC)    Bipolar 1  disorder (HCC)    Depression    Drug abuse (HCC)    ETOH abuse    Polysubstance abuse (HCC)      Medicines Meds ordered this encounter  Medications   ziprasidone  (GEODON ) injection 20 mg   sterile water  (preservative free) injection    Eulah Heymann M: cabinet override    I have reviewed the patients home medicines and have made adjustments as needed  Problem List / ED Course: Problem List Items Addressed This Visit   None               Final Clinical Impression(s) / ED Diagnoses Final diagnoses:  None    Rx / DC Orders ED Discharge Orders     None         Kerwin Augustus, Vonzella Guernsey, MD 08/21/23 [phone]

## 2023-08-21 NOTE — Plan of Care (Signed)
   Problem: Education: Goal: Knowledge of [facility] Education information/materials will improve Outcome: Progressing Goal: Emotional status will improve Outcome: Progressing Goal: Mental status will improve Outcome: Progressing

## 2023-08-22 ENCOUNTER — Encounter (HOSPITAL_COMMUNITY): Payer: Self-pay

## 2023-08-22 DIAGNOSIS — F332 Major depressive disorder, recurrent severe without psychotic features: Secondary | ICD-10-CM | POA: Diagnosis not present

## 2023-08-22 MED ORDER — LORAZEPAM 1 MG PO TABS
1.0000 mg | ORAL_TABLET | Freq: Four times a day (QID) | ORAL | Status: AC
  Administered 2023-08-22 – 2023-08-23 (×3): 1 mg via ORAL
  Filled 2023-08-22 (×3): qty 1

## 2023-08-22 MED ORDER — SERTRALINE HCL 50 MG PO TABS
50.0000 mg | ORAL_TABLET | Freq: Every day | ORAL | Status: DC
  Administered 2023-08-22 – 2023-08-24 (×3): 50 mg via ORAL
  Filled 2023-08-22 (×4): qty 1

## 2023-08-22 MED ORDER — LORAZEPAM 1 MG PO TABS
1.0000 mg | ORAL_TABLET | Freq: Every day | ORAL | Status: AC
  Administered 2023-08-26: 1 mg via ORAL
  Filled 2023-08-22: qty 1

## 2023-08-22 MED ORDER — LORAZEPAM 1 MG PO TABS: 1.0000 mg | ORAL_TABLET | Freq: Four times a day (QID) | ORAL | Status: AC | PRN

## 2023-08-22 MED ORDER — NICOTINE 21 MG/24HR TD PT24
21.0000 mg | MEDICATED_PATCH | Freq: Every day | TRANSDERMAL | Status: DC
  Administered 2023-08-23 – 2023-08-27 (×5): 21 mg via TRANSDERMAL
  Filled 2023-08-22 (×6): qty 1

## 2023-08-22 MED ORDER — LORAZEPAM 1 MG PO TABS
1.0000 mg | ORAL_TABLET | Freq: Three times a day (TID) | ORAL | Status: AC
  Administered 2023-08-23 – 2023-08-24 (×3): 1 mg via ORAL
  Filled 2023-08-22 (×3): qty 1

## 2023-08-22 MED ORDER — LORAZEPAM 1 MG PO TABS
1.0000 mg | ORAL_TABLET | Freq: Two times a day (BID) | ORAL | Status: AC
  Administered 2023-08-24 – 2023-08-25 (×2): 1 mg via ORAL
  Filled 2023-08-22 (×2): qty 1

## 2023-08-22 NOTE — [facility] Group Notes (Signed)
Patient attended the NA group. ?

## 2023-08-22 NOTE — H&P (Signed)
 Psychiatric Admission Assessment Adult  Patient Identification: [name] MRN:  [id number] Date of Evaluation:  08/22/2023 Chief Complaint:  Substance induced mood disorder (HCC) [F19.94] Principal Diagnosis: <principal problem not specified> Diagnosis:  Active Problems:   Substance induced mood disorder (HCC)  CC: "I found out my ex got married to another person and had a panic attack"  Suzzanne Pistorio is a 43 y.o. female  with a past psychiatric history of borderline personality disorder, benzodiazepine dependence, alcohol use disorder, major depressive disorder. Patient initially arrived to [facility] on 5/13 under involuntary commitment after they received a message from her friend that she had texted plans to jump off a bridge, and admitted to [facility] under IVC on May 13 for acute safety concerns, crisis stabalization, and acute suicidal or self-harming behaviors. PPHx is significant for history of multiple suicide Attempts and multiple prior Psychiatric Hospitalizations. PMHx is significant for none.   Current Outpatient Medications  Medication Instructions   albuterol  (VENTOLIN  HFA) 108 (90 Base) MCG/ACT inhaler 2 puffs, Inhalation, Every 6 hours PRN   pantoprazole  (PROTONIX ) 40 mg, Oral, Daily    According to outside records, the patient  This a 43 year old female who presents under police custody with involuntary commitment paperwork. Per police report, they received a message from a friend that she had texted plans to jump off a bridge. They found her in proximity to a bridge. She was belligerent and yelling. They were unable to get a story from her. For this reason they IVC to her. On my evaluation she is uncooperative, aggressive, yelling. She appears intoxicated.   She received Geodon  in the ED.  Per TTS, Patient seen at [facility], ED for face-to-face psychiatric evaluation. Patient is upset and slightly irritable about being in the hospital and under IVC. She does admit to  a psychiatric history of depression, anxiety, PTSD. She does have a history of previous suicide attempts, with the most recent being in 2018. Patient stated since 2018 she has been doing fine. She has been off all psychotropic medications for at least 1 year now. She does report being depressed every day all the time but is refusing wanting to start scheduled psychotropic medications again. She stated last night she got drunk because she found out her ex got married. She does not remember sending any suicidal text to her friends and states she was never near a bridge. She stated the police picked her up in a random parking lot. She is afraid of losing her job. She does admit to some passive SI over all the stress in her life as far as relationships and finances. She feels she is not where she is supposed to be 43 years old. She has low self-esteem. She does mention being addicted to Xanax , taking anywhere between 2 to 4 mg a day for over 6 months. She is worried about going through withdrawal in the hospital. Denies any withdrawal seizures. She is denying current SI. Denies HI. Denies AVH. I did explain to patient she will need inpatient psychiatric treatment and she is agreeable with this plan. Patient will remain under IVC. Patient has been accepted to [facility] and will transfer there today.   Chart review:  Vital signs: Stable MAR was reviewed and patient was compliant with medications. Patient received PRN Ativan  1 mg last night and at 8 AM this morning.  She also received as needed hydroxyzine  last night. Labs: Potassium 3.4.  Alcohol 172.  UDS positive for benzodiazepines and THC.  EKG  QTc 419 (May 13) Sleep: 7 hours  HPI:  Patient is seen on the unit. Patient is alert, cooperative.   Patient reports that on Monday she saw on social media that her ex who she has not been with for 2 years, married to another person.  She reports that she stopped the picture of their wedding and she started having a panic  attack.  She reports that she called an old friend and she got drunk.  She reports she drink a whole bottle of vodka. She then told the friend to bring her to [facility] where another one of her friends were.  She reports the friend called the police due to her concern for her.  She reports that at the time of the police did not have any grounds to do anything for her.  She reports that after the police left she then texted her friend that she was going to jump off a bridge.  She reports that her friend then called the police again and told them about the text message.  She reports that on her way to the bridge the police came and apprehended her.  She reports she has been feeling more hopeless, depressed, isolated.  She has only been going to work and then laying in her bed.  She reports that she has been feeling overwhelmed and depressed.  She reports she has been feeling this way for years and has had chronic passive SI for years, however after seeing the picture of her ex and getting intoxicated was when she actually made a suicide attempt.  Prior to seeing the pictures she had not had any plan to commit suicide.  She reports that she has had increased sleep.  She also reports some decreased appetite.  She reports increased anxiety and self-medication with Xanax  and alcohol.  She denies any history of auditory or visual hallucinations.  She denies any intrusive unwanted thoughts.  She reports physical and emotional trauma.  She reports only occasional flashbacks and paranoia only once last year.  She denies any history of manic episodes, she reports that once they report days where she did not sleep but she felt very tired and she was more paranoid at that time.  She reports chronic feelings of hopelessness worthlessness and unstable intense relationships.  She also reports that she has daily mood fluctuations.  She reports other stressors include that she had to go to court on Monday for an assault charge with  people that she used to live with.  She also reports recent deaths of family members and getting fired from another job that she had around [place] time.  She currently denies any SI, HI, AVH.  She currently reports that she regrets the suicide attempt and does not want to die.  She reports that she has been using Xanax  for over 8 years for anxiety nonprescribed.  She reports that she has been using 2 mg to 4 mg on a bad day.  She reports that she had a period where she detoxed after she was in jail and stopped taking the meds for over a year.  However she reports that she just started again.  She denies any history of seizures from benzo withdrawal.  She reports that she has gone to rehab for alcohol and drug use she reports that she went once in 2016 and it was helpful and went to an [facility] afterwards.  However she reported that she is then relapsed in 2017.  She also reports alcohol use.  She reports that she has been using alcohol more in the past months she reports that she has used it at least twice.  Prior to last month she said she was only using alcohol couple times a year.  She reports that she was using alcohol more recently because she felt like Xanax  was not enough for her anxiety depression and stress.  She also reports cannabis use every day about 1 to 2 g.  She also reports smoking cigarettes 1 to 2 packs/day.  She is interested in the nicotine  patch.  She reports prior diagnoses of PTSD, bipolar, MDD, GAD, borderline personality disorder.  She reports that she "does not believe any of that crap."  She reports that she stopped medications around a year ago because she felt like it was not working.  She also reports that she had difficulty paying for the medications.  She reports she was previously seeing the psychiatrist at [facility] but she stopped a year ago.  She reports her last suicide attempt was in 2018 and she also had a couple of suicide attempts prior to that.  Per chart review, she had  prior hospitalizations at [facility] in 2008 x 2, 2010, 2011 x 3, 2012 x 3, 2014 x 1, 2022 x 2 and most recently in March 2024 at [facility].  She was last seen at [facility] outpatient in April 2024. She reports that she used to cut herself a long time ago but it was in the early 20s.  She does confirm that the last medications that she had were the BuSpar , gabapentin , prazosin , Zoloft  and trazodone  which are prescribed from the behavioral health urgent care and from her last admission at [facility].  She reports a family history of her mom with bipolar and borderline personality disorder and major depressive disorder as well as cannabis use.  She reports her dad has PTSD and depression.  She reports her sister has anxiety and her brother has had maybe 1 psych hospitalization a long time ago.  She reports her mom had several suicide attempts.  She reports she lives with her mom and dad and her dog and cat.  She reports that she did not graduate high school, reports that she got suspended prior to graduating her senior year.  She denies any military history.  She denies that she has any children.  She denies that she is not in any current relationship.  She reports the one with her ex was 2 years ago and they separated due to his infidelity.  Collateral information:  She does not give permission to contact her family for collateral  Past Psychiatric Hx: She reports prior diagnoses of PTSD, bipolar, MDD, GAD, borderline personality disorder.  She reports that she "does not believe any of that crap."  She reports that she stopped medications around a year ago because she felt like it was not working.  She also reports that she had difficulty paying for the medications.  She reports she was previously seeing the psychiatrist at [facility] but she stopped a year ago.  She reports her last suicide attempt was in 2018 and she also had a couple of suicide attempts prior to that.  Per chart review, she had prior hospitalizations at [facility] in 2008  x 2, 2010, 2011 x 3, 2012 x 3, 2014 x 1, 2022 x 2 and most recently in March 2024 at [facility].  She was last seen at [facility] outpatient in April 2024. She reports  that she used to cut herself a long time ago but it was in the early 20s.  She does confirm that the last medications that she had were the BuSpar , gabapentin , prazosin , Zoloft  and trazodone  which are prescribed from the behavioral health urgent care and from her last admission at [facility].  Substance Abuse Hx: (Frequency, quantity, last use, impact) She reports that she has been using Xanax  for over 8 years for anxiety nonprescribed.  She reports that she has been using 2 mg to 4 mg on a bad day.  She reports that she had a period where she detoxed after she was in jail and stopped taking the meds for over a year.  However she reports that she just started again.  She denies any history of seizures from benzo withdrawal.  She reports that she has gone to rehab for alcohol and drug use she reports that she went once in 2016 and it was helpful and went to an [facility] afterwards.  However she reported that she is then relapsed in 2017.  She also reports alcohol use.  She reports that she has been using alcohol more in the past months she reports that she has used it at least twice.  Prior to last month she said she was only using alcohol couple times a year.  She reports that she was using alcohol more recently because she felt like Xanax  was not enough for her anxiety depression and stress.  She also reports cannabis use every day about 1 to 2 g.  She also reports smoking cigarettes 1 to 2 packs/day.  She is interested in the nicotine  patch.  Past Medical History: PCP: None Medical Dx: Asthma, GERD Medications: Albuterol  as needed, Protonix  as needed Allergies: Reports allergy to Haldol , she reports that it makes her neck tight and tense and she feels restless Having periods  Family Medical History: Family History  Problem Relation Age of Onset    Drug abuse Mother    Anxiety disorder Mother    Anxiety disorder Father    Drug abuse Maternal Aunt    Alcohol abuse Maternal Grandfather    Drug abuse Cousin     Family Psychiatric History: She reports a family history of her mom with bipolar and borderline personality disorder and major depressive disorder as well as cannabis use.  She reports her dad has PTSD and depression.  She reports her sister has anxiety and her brother has had maybe 1 psych hospitalization a long time ago.  She reports her mom had several suicide attempts.  Social History: She reports she lives with her mom and dad and her dog and cat.  She reports that she did not graduate high school, reports that she got suspended prior to graduating her senior year.  She denies any military history.  She denies that she has any children.  She denies that she is not in any current relationship.  She reports the one with her ex was 2 years ago and they separated due to his infidelity.  She is currently working at [employer].  Access to firearms: Reports that there is a gun that is locked up at home Medication stockpile: Denies  Total Time spent with patient: 45 minutes  Is the patient at risk to self? Yes.    Has the patient been a risk to self in the past 6 months? Yes.    Has the patient been a risk to self within the distant past? Yes.  Is the patient a risk to others? No.  Has the patient been a risk to others in the past 6 months? No.  Has the patient been a risk to others within the distant past? Yes.     [place] Scale:  Flowsheet Row [facility] Most recent reading at 08/21/2023  4:47 PM ED from 08/21/2023 in [facility] Emergency Department at [facility] Most recent reading at 08/21/2023 12:30 AM Admission (Discharged) from 06/27/2022 in [facility]PAT[facility] Most recent reading at 06/28/2022 10:22 AM  C-SSRS RISK CATEGORY  Moderate Risk Moderate Risk Low Risk        Tobacco Screening:  Social History   Tobacco Use  Smoking Status Every Day   Current packs/day: 1.50   Average packs/day: 1.5 packs/day for 17.0 years (25.5 ttl pk-yrs)   Types: Cigarettes  Smokeless Tobacco Never    BH Tobacco Counseling     Are you interested in Tobacco Cessation Medications?  No, patient refused Counseled patient on smoking cessation:  Refused/Declined practical counseling Reason Tobacco Screening Not Completed: Patient Refused Screening       Social History:  Social History   Substance and Sexual Activity  Alcohol Use Yes     Social History   Substance and Sexual Activity  Drug Use Yes   Types: Marijuana, Benzodiazepines   Comment: Xanax     Additional Social History: Marital status: Single Are you sexually active?: No What is your sexual orientation?: lesbian Has your sexual activity been affected by drugs, alcohol, medication, or emotional stress?: n/a Does patient have children?: No                         Allergies:   Allergies  Allergen Reactions   Haldol  [Haloperidol  Lactate] Other (See Comments)    Makes whole body stiff   Lab Results:  Results for orders placed or performed during the hospital encounter of 08/21/23 (from the past 48 hours)  CBC with Differential     Status: Abnormal   Collection Time: 08/21/23  1:50 AM  Result Value Ref Range   WBC 4.6 4.0 - 10.5 K/uL   RBC 4.06 3.87 - 5.11 MIL/uL   Hemoglobin 13.9 12.0 - 15.0 g/dL   HCT 16.1 09.6 - 04.5 %   MCV 100.5 (H) 80.0 - 100.0 fL   MCH 34.2 (H) 26.0 - 34.0 pg   MCHC 34.1 30.0 - 36.0 g/dL   RDW 40.9 81.1 - 91.4 %   Platelets 246 150 - 400 K/uL   nRBC 0.0 0.0 - 0.2 %   Neutrophils Relative % 39 %   Neutro Abs 1.8 1.7 - 7.7 K/uL   Lymphocytes Relative 51 %   Lymphs Abs 2.4 0.7 - 4.0 K/uL   Monocytes Relative 7 %   Monocytes Absolute 0.3 0.1 - 1.0 K/uL   Eosinophils Relative 2 %   Eosinophils Absolute 0.1 0.0  - 0.5 K/uL   Basophils Relative 1 %   Basophils Absolute 0.1 0.0 - 0.1 K/uL   Immature Granulocytes 0 %   Abs Immature Granulocytes 0.01 0.00 - 0.07 K/uL    Comment: Performed at [facility], [address] [place], [place] [address]  Comprehensive metabolic panel     Status: Abnormal   Collection Time: 08/21/23  1:50 AM  Result Value Ref Range   Sodium 143 135 - 145 mmol/L   Potassium 3.4 (L) 3.5 -  5.1 mmol/L   Chloride 115 (H) 98 - 111 mmol/L   CO2 17 (L) 22 - 32 mmol/L   Glucose, Bld 83 70 - 99 mg/dL    Comment: Glucose reference range applies only to samples taken after fasting for at least 8 hours.   BUN 7 6 - 20 mg/dL   Creatinine, Ser 4.09 0.44 - 1.00 mg/dL   Calcium  8.8 (L) 8.9 - 10.3 mg/dL   Total Protein 6.9 6.5 - 8.1 g/dL   Albumin 4.0 3.5 - 5.0 g/dL   AST 16 15 - 41 U/L   ALT 13 0 - 44 U/L   Alkaline Phosphatase 37 (L) 38 - 126 U/L   Total Bilirubin <0.2 0.0 - 1.2 mg/dL   GFR, Estimated >81 >19 mL/min    Comment: (NOTE) Calculated using the CKD-EPI Creatinine Equation (2021)    Anion gap 11 5 - 15    Comment: Performed at [facility], [address], [place], [place] [address]  Ethanol     Status: Abnormal   Collection Time: 08/21/23  1:50 AM  Result Value Ref Range   Alcohol, Ethyl (B) 172 (H) <15 mg/dL    Comment: Please note change in reference range. (NOTE) For medical purposes only. Performed at [facility], [address] [place], [place] [address]   Acetaminophen  level     Status: Abnormal   Collection Time: 08/21/23  1:50 AM  Result Value Ref Range   Acetaminophen  (Tylenol ), Serum <10 (L) 10 - 30 ug/mL    Comment: (NOTE) Therapeutic concentrations vary significantly. A range of 10-30 ug/mL  may be an effective concentration for many patients. However, some  are best treated at concentrations outside of this range. Acetaminophen  concentrations >150 ug/mL at 4 hours after ingestion  and >50 ug/mL at 12 hours after ingestion are  often associated with  toxic reactions.  Performed at [facility], [address] [place], [place] [address]   Salicylate level     Status: Abnormal   Collection Time: 08/21/23  1:50 AM  Result Value Ref Range   Salicylate Lvl <7.0 (L) 7.0 - 30.0 mg/dL    Comment: Performed at [facility], [address] [place], [place] [address]  Rapid urine drug screen (hospital performed)     Status: Abnormal   Collection Time: 08/21/23  3:11 AM  Result Value Ref Range   Opiates NONE DETECTED NONE DETECTED   Cocaine  NONE DETECTED NONE DETECTED   Benzodiazepines POSITIVE (A) NONE DETECTED   Amphetamines NONE DETECTED NONE DETECTED   Tetrahydrocannabinol POSITIVE (A) NONE DETECTED   Barbiturates NONE DETECTED NONE DETECTED    Comment: (NOTE) DRUG SCREEN FOR MEDICAL PURPOSES ONLY.  IF CONFIRMATION IS NEEDED FOR ANY PURPOSE, NOTIFY LAB WITHIN 5 DAYS.  LOWEST DETECTABLE LIMITS FOR URINE DRUG SCREEN Drug Class                     Cutoff (ng/mL) Amphetamine and metabolites    1000 Barbiturate and metabolites    200 Benzodiazepine                 200 Opiates and metabolites        300 Cocaine  and metabolites        300 THC                            50 Performed at [facility]  Lab, [address], [place], [place] [address]     Blood Alcohol level:  Lab Results  Component Value Date   ETH 172 (H) 08/21/2023   ETH 156 (H) 06/27/2022    Metabolic Disorder Labs:  Lab Results  Component Value Date   HGBA1C 5.2 06/27/2022   MPG 103 06/27/2022   MPG 102.54 07/12/2020   No results found for: "PROLACTIN" Lab Results  Component Value Date   CHOL 158 06/27/2022   TRIG 77 06/27/2022   HDL 59 06/27/2022   CHOLHDL 2.7 06/27/2022   VLDL 15 06/27/2022   LDLCALC 84 06/27/2022   LDLCALC 73 07/12/2020    Current Medications: Current Facility-Administered Medications  Medication Dose Route Frequency Provider Last Rate Last Admin   acetaminophen  (TYLENOL ) tablet 975 mg   975 mg Oral Q6H PRN [name], NP       alum & mag hydroxide-simeth (MAALOX/MYLANTA) 200-200-20 MG/5ML suspension 30 mL  30 mL Oral Q4H PRN [name], NP       hydrOXYzine  (ATARAX ) tablet 25 mg  25 mg Oral Q6H PRN [name], NP   25 mg at 08/21/23 2052   loperamide  (IMODIUM ) capsule 2-4 mg  2-4 mg Oral PRN [name], NP       LORazepam  (ATIVAN ) tablet 1 mg  1 mg Oral Q6H PRN Muranda Coye, MD       LORazepam  (ATIVAN ) tablet 1 mg  1 mg Oral QID Kyndal Gloster, MD       Followed by   Cecily Cohen ON 08/23/2023] LORazepam  (ATIVAN ) tablet 1 mg  1 mg Oral TID Anni Hocevar, MD       Followed by   Cecily Cohen ON 08/24/2023] LORazepam  (ATIVAN ) tablet 1 mg  1 mg Oral BID Wyatt Thorstenson, MD       Followed by   Cecily Cohen ON 08/26/2023] LORazepam  (ATIVAN ) tablet 1 mg  1 mg Oral Daily Gemma Ruan, MD       magnesium  hydroxide (MILK OF MAGNESIA) suspension 30 mL  30 mL Oral Daily PRN [name], NP       [START ON 08/23/2023] nicotine  (NICODERM CQ  - dosed in mg/24 hours) patch 21 mg  21 mg Transdermal Daily Rosamund Nyland, MD       OLANZapine  (ZYPREXA ) injection 10 mg  10 mg Intramuscular TID PRN [name], NP       OLANZapine  (ZYPREXA ) injection 5 mg  5 mg Intramuscular TID PRN [name], NP       OLANZapine  zydis (ZYPREXA ) disintegrating tablet 5 mg  5 mg Oral TID PRN [name], NP       ondansetron  (ZOFRAN -ODT) disintegrating tablet 4 mg  4 mg Oral Q6H PRN [name], NP       sertraline  (ZOLOFT ) tablet 50 mg  50 mg Oral Daily Lakota Markgraf, MD       traZODone  (DESYREL ) tablet 50 mg  50 mg Oral QHS PRN [name], NP       PTA Medications: Medications Prior to Admission  Medication Sig Dispense Refill Last Dose/Taking   albuterol  (VENTOLIN  HFA) 108 (90 Base) MCG/ACT inhaler Inhale 2 puffs into the lungs every 6 (six) hours as needed for wheezing or shortness of breath. 8.5 g 0 Past Month   pantoprazole  (PROTONIX ) 40 MG tablet Take 1  tablet (40 mg total) by mouth daily. (Patient taking differently: Take 40 mg by mouth daily as needed (indigestion).) 30 tablet 0     Musculoskeletal: Strength & Muscle Tone: within normal limits Gait &  Station: normal Patient leans: N/A  Psychiatric Specialty Exam:  Presentation  General Appearance: Casual  Eye Contact:Fair  Speech:Clear and Coherent  Speech Volume:Normal  Handedness:Right   Mood and Affect  Mood:Labile  Affect:Congruent   Thought Process  Thought Processes:Coherent  Descriptions of Associations:Intact  Orientation:Full (Time, Place and Person)  Thought Content:Perseveration  History of Schizophrenia/Schizoaffective disorder: None Duration of Psychotic Symptoms:N/A Hallucinations:Hallucinations: None  Ideas of Reference:None  Suicidal Thoughts:Suicidal Thoughts: No  Homicidal Thoughts:Homicidal Thoughts: No   Sensorium  Memory:Immediate Fair  Judgment:Poor  Insight:Shallow   Executive Functions  Concentration:Fair  Attention Span:Fair  Recall:Fair  Fund of Knowledge:Fair  Language:Fair   Psychomotor Activity  Psychomotor Activity:Psychomotor Activity: Normal   Assets  Assets:Communication Skills; Desire for Improvement; Financial Resources/Insurance; Housing   Sleep  Sleep:Sleep: Fair   Physical Exam ROS  Physical Exam Constitutional:      Appearance: the patient is not toxic-appearing.  Pulmonary:     Effort: Pulmonary effort is normal.  Neurological:     General: No focal deficit present.     Mental Status: the patient is alert and oriented to person, place, and time.   Review of Systems  Respiratory:  Negative for shortness of breath.   Cardiovascular:  Negative for chest pain.  Gastrointestinal:  Negative for abdominal pain, constipation, diarrhea, nausea and vomiting.  Neurological:  Negative for headaches.  Endo: Positive for hot flashes  Blood pressure 125/84, pulse 60, temperature 97.8 F  (36.6 C), temperature source Oral, resp. rate 20, height 5\' 2"  (1.575 m), weight 62.6 kg, SpO2 99%. Body mass index is 25.24 kg/m.   Treatment Plan Summary: Daily contact with patient to assess and evaluate symptoms and progress in treatment, Medication management, and Plan    ASSESSMENT: Kior Florio is a 43 y.o. female  with a past psychiatric history of borderline personality disorder, benzodiazepine dependence, alcohol use disorder, major depressive disorder. Patient initially arrived to [facility] on 5/13 under involuntary commitment after they received a message from her friend that she had texted plans to jump off a bridge, and admitted to [facility] under IVC on May 13 for acute safety concerns, crisis stabalization, and acute suicidal or self-harming behaviors. PPHx is significant for history of multiple suicide Attempts and multiple prior Psychiatric Hospitalizations. PMHx is significant for asthma, GERD.   Patient meets criteria for major depressive disorder and is willing to try Zoloft .  She is also willing to taper off the benzodiazepines and we discussed a Ativan  taper for her ongoing illicit Xanax  use. Discussed that she may still have psychological withdrawal symptoms after her taper off ativan . Patient also seems to have traits that are consistent with borderline personality disorder and has had previous diagnosis.  Suicide attempt appears to have been in the context of her being intoxicated along with recent self-medication for her depression and anxiety.  Diagnoses / Active Problems: Borderline personality disorder MDD, recurrent, severe Generalized anxiety disorder Alcohol use disorder Sedative, hypnotic, or anxiolytic use disorder Cannabis use disorder Tobacco use disorder  PLAN: Safety and Monitoring:  --  INVOLUNTARY  admission to inpatient psychiatric unit for safety, stabilization and treatment  -- Daily contact with patient to assess and evaluate symptoms and progress  in treatment  -- Patient's case to be discussed in multi-disciplinary team meeting  -- Observation Level : q15 minute checks  -- Vital signs:  q12 hours  -- Precautions: suicide, elopement, and assault  2. Psychiatric Diagnoses and Treatment:   --start zoloft  50mg  for depression and anxiety   --  start ativan  taper for ongoing BZD use   --start CIWA with PRN ativan  for BZD withdrawal   --continue to encourage cessation from substances  -- The risks/benefits/side-effects/alternatives to this medication were discussed in detail with the patient and time was given for questions. The patient consents to medication trial.              -- Metabolic profile and EKG monitoring obtained while on an atypical antipsychotic BMI: Body mass index is 25.24 kg/m. TSH:  Lab Results  Component Value Date   TSH 2.468 06/27/2022  Lipid Panel:  Lab Results  Component Value Date   CHOL 158 06/27/2022   HDL 59 06/27/2022   LDLCALC 84 06/27/2022   TRIG 77 06/27/2022   CHOLHDL 2.7 06/27/2022   QTc: 454 (May 13)             -- Encouraged patient to participate in unit milieu and in scheduled group therapies   -- Short Term Goals: Ability to identify changes in lifestyle to reduce recurrence of condition will improve, Ability to verbalize feelings will improve, Ability to disclose and discuss suicidal ideas, Ability to demonstrate self-control will improve, Ability to identify and develop effective coping behaviors will improve, Ability to maintain clinical measurements within normal limits will improve, Compliance with prescribed medications will improve, and Ability to identify triggers associated with substance abuse/mental health issues will improve  -- Long Term Goals: Improvement in symptoms so as ready for discharge  Other PRNS:  acetaminophen , alum & mag hydroxide-simeth, hydrOXYzine , loperamide , LORazepam , magnesium  hydroxide, OLANZapine , OLANZapine , OLANZapine  zydis, ondansetron , traZODone    -- As  needed agitation protocol in-place  3. Medical Issues Being Addressed:   #Tobacco Use Disorder  Nicotine  patch 21mg /24 hours ordered  Smoking cessation encouraged  4. Discharge Planning:   -- Social work and case management to assist with discharge planning and identification of hospital follow-up needs prior to discharge  -- Estimated LOS: 5-7 days  -- Discharge Concerns: Need to establish a safety plan; Medication compliance and effectiveness  -- Discharge Goals: Return home with outpatient referrals for mental health follow-up including medication management/psychotherapy   I certify that inpatient services furnished can reasonably be expected to improve the patient's condition.   This note was created using a voice recognition software as a result there may be grammatical errors inadvertently enclosed that do not reflect the nature of this encounter. Every attempt is made to correct such errors.   This case was discussed with attending Dr. Linnie Riches who agrees with the above formulated treatment plan. Please see attending attestation for additional details.   This note was created using a voice recognition software as a result there may be grammatical errors inadvertently enclosed that do not reflect the nature of this encounter. Every attempt is made to correct such errors.   Signed: Dr. Norbert Bean, MD PGY-2, Psychiatry Residency  5/14/20252:53 PM

## 2023-08-22 NOTE — Group Note (Signed)
 Date:  08/22/2023 Time:  9:06 AM  Group Topic/Focus:  Goals Group:   The focus of this group is to help patients establish daily goals to achieve during treatment and discuss how the patient can incorporate goal setting into their daily lives to aide in recovery. Orientation:   The focus of this group is to educate the patient on the purpose and policies of crisis stabilization and provide a format to answer questions about their admission.  The group details unit policies and expectations of patients while admitted.    Participation Level:  Minimal  Participation Quality:  Appropriate  Affect:  Appropriate  Cognitive:  Appropriate  Insight: Appropriate  Engagement in Group:  Developing/Improving  Modes of Intervention:  Discussion and Orientation  Additional Comments:    [name] 08/22/2023, 9:06 AM

## 2023-08-22 NOTE — Group Note (Signed)
 Recreation Therapy Group Note   Group Topic:Communication  Group Date: 08/22/2023 Start Time: 0933 End Time: 1013 Facilitators: Azariah Latendresse-McCall, LRT,CTRS Location: [address] Day[address]   Group Topic: Communication, Problem Solving   Goal Area(s) Addresses:  Patient will effectively listen to complete activity.  Patient will identify communication skills used to make activity successful.  Patient will identify how skills used during activity can be used to reach post d/c goals.    Behavioral Response: N/A   Intervention: Building surveyor Activity - Geometric pattern cards, pencils, blank paper    Activity: Geometric Drawings.  Three volunteers from the peer group will be shown an abstract picture with a particular arrangement of geometrical shapes.  Each round, one 'speaker' will describe the pattern, as accurately as possible without revealing the image to the group.  The remaining group members will listen and draw the picture to reflect how it is described to them. Patients with the role of 'listener' cannot ask clarifying questions but, may request that the speaker repeat a direction. Once the drawings are complete, the presenter will show the rest of the group the picture and compare how close each person came to drawing the picture. LRT will facilitate a post-activity discussion regarding effective communication and the importance of planning, listening, and asking for clarification in daily interactions with others.  Education: Environmental consultant, Active listening, Support systems, Discharge planning  Education Outcome: Acknowledges understanding/In group clarification offered/Needs additional education.    Affect/Mood: N/A   Participation Level: Did not attend    Clinical Observations/Individualized Feedback:     Plan: Continue to engage patient in RT group sessions 2-3x/week.   [name], LRT,CTRS 08/22/2023 12:23 PM

## 2023-08-22 NOTE — Group Note (Signed)
 Date:  08/22/2023 Time:  1:24 PM  Group Topic/Focus:  Emotional Education:   The focus of this group is to discuss unhealthy thought patterns and how they impact mental health.     Participation Level:  Minimal  Participation Quality:  Appropriate  Affect:  Appropriate  Cognitive:  Appropriate  Insight: Appropriate  Engagement in Group:  Developing/Improving  Modes of Intervention:  Discussion and Education  Additional Comments:    Sheryl Donna 08/22/2023, 1:24 PM
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
## 2023-08-22 NOTE — [facility] Suicide Risk Assessment (Signed)
 [facility] Admission Suicide Risk Assessment  Nursing information obtained from:  Patient Demographic factors:  Caucasian, Low socioeconomic status Current Mental Status:  NA Loss Factors:  NA Historical Factors:  NA Risk Reduction Factors:  NA  Total Time spent with patient: 45 minutes Principal Problem: <principal problem not specified> Diagnosis:  Active Problems:   Substance induced mood disorder (HCC)  Subjective Data:  CC: "I found out my ex got married to another person and had a panic attack"   [name] is a 43 y.o. female  with a past psychiatric history of borderline personality disorder, benzodiazepine dependence, alcohol use disorder, major depressive disorder. Patient initially arrived to [facility] on 5/13 under involuntary commitment after they received a message from her friend that she had texted plans to jump off a bridge, and admitted to [facility] under IVC on May 13 for acute safety concerns, crisis stabalization, and acute suicidal or self-harming behaviors. PPHx is significant for history of multiple suicide Attempts and multiple prior Psychiatric Hospitalizations. PMHx is significant for none.        Current Outpatient Medications  Medication Instructions   albuterol  (VENTOLIN  HFA) 108 (90 Base) MCG/ACT inhaler 2 puffs, Inhalation, Every 6 hours PRN   pantoprazole  (PROTONIX ) 40 mg, Oral, Daily    According to outside records, the patient  This a 42 year old female who presents under police custody with involuntary commitment paperwork. Per police report, they received a message from a friend that she had texted plans to jump off a bridge. They found her in proximity to a bridge. She was belligerent and yelling. They were unable to get a story from her. For this reason they IVC to her. On my evaluation she is uncooperative, aggressive, yelling. She appears intoxicated.   She received Geodon  in the ED.   Per TTS, Patient seen at [facility], ED for face-to-face psychiatric  evaluation. Patient is upset and slightly irritable about being in the hospital and under IVC. She does admit to a psychiatric history of depression, anxiety, PTSD. She does have a history of previous suicide attempts, with the most recent being in 2018. Patient stated since 2018 she has been doing fine. She has been off all psychotropic medications for at least 1 year now. She does report being depressed every day all the time but is refusing wanting to start scheduled psychotropic medications again. She stated last night she got drunk because she found out her ex got married. She does not remember sending any suicidal text to her friends and states she was never near a bridge. She stated the police picked her up in a random parking lot. She is afraid of losing her job. She does admit to some passive SI over all the stress in her life as far as relationships and finances. She feels she is not where she is supposed to be 43 years old. She has low self-esteem. She does mention being addicted to Xanax , taking anywhere between 2 to 4 mg a day for over 6 months. She is worried about going through withdrawal in the hospital. Denies any withdrawal seizures. She is denying current SI. Denies HI. Denies AVH. I did explain to patient she will need inpatient psychiatric treatment and she is agreeable with this plan. Patient will remain under IVC. Patient has been accepted to [facility] and will transfer there today.    Chart review:  Vital signs: Stable MAR was reviewed and patient was compliant with medications. Patient received PRN Ativan  1 mg last night and  at 8 AM this morning.  She also received as needed hydroxyzine  last night. Labs: Potassium 3.4.  Alcohol 172.  UDS positive for benzodiazepines and THC.  EKG QTc 419 (May 13) Sleep: 7 hours   HPI:  Patient is seen on the unit. Patient is alert, cooperative.    Patient reports that on Monday she saw on social media that her ex who she has not been with for 2 years,  married to another person.  She reports that she stopped the picture of their wedding and she started having a panic attack.  She reports that she called an old friend and she got drunk.  She reports she drink a whole bottle of vodka. She then told the friend to bring her to [facility] where another one of her friends were.  She reports the friend called the police due to her concern for her.  She reports that at the time of the police did not have any grounds to do anything for her.  She reports that after the police left she then texted her friend that she was going to jump off a bridge.  She reports that her friend then called the police again and told them about the text message.  She reports that on her way to the bridge the police came and apprehended her.  She reports she has been feeling more hopeless, depressed, isolated.  She has only been going to work and then laying in her bed.  She reports that she has been feeling overwhelmed and depressed.  She reports she has been feeling this way for years and has had chronic passive SI for years, however after seeing the picture of her ex and getting intoxicated was when she actually made a suicide attempt.  Prior to seeing the pictures she had not had any plan to commit suicide.  She reports that she has had increased sleep.  She also reports some decreased appetite.  She reports increased anxiety and self-medication with Xanax  and alcohol.  She denies any history of auditory or visual hallucinations.  She denies any intrusive unwanted thoughts.  She reports physical and emotional trauma.  She reports only occasional flashbacks and paranoia only once last year.  She denies any history of manic episodes, she reports that once they report days where she did not sleep but she felt very tired and she was more paranoid at that time.  She reports chronic feelings of hopelessness worthlessness and unstable intense relationships.  She also reports that she has daily mood  fluctuations.  She reports other stressors include that she had to go to court on Monday for an assault charge with people that she used to live with.  She also reports recent deaths of family members and getting fired from another job that she had around [place] time.  She currently denies any SI, HI, AVH.  She currently reports that she regrets the suicide attempt and does not want to die.   She reports that she has been using Xanax  for over 8 years for anxiety nonprescribed.  She reports that she has been using 2 mg to 4 mg on a bad day.  She reports that she had a period where she detoxed after she was in jail and stopped taking the meds for over a year.  However she reports that she just started again.  She denies any history of seizures from benzo withdrawal.  She reports that she has gone to rehab for alcohol and drug  use she reports that she went once in 2016 and it was helpful and went to an [facility] afterwards.  However she reported that she is then relapsed in 2017.  She also reports alcohol use.  She reports that she has been using alcohol more in the past months she reports that she has used it at least twice.  Prior to last month she said she was only using alcohol couple times a year.  She reports that she was using alcohol more recently because she felt like Xanax  was not enough for her anxiety depression and stress.  She also reports cannabis use every day about 1 to 2 g.  She also reports smoking cigarettes 1 to 2 packs/day.  She is interested in the nicotine  patch.   She reports prior diagnoses of PTSD, bipolar, MDD, GAD, borderline personality disorder.  She reports that she "does not believe any of that crap."  She reports that she stopped medications around a year ago because she felt like it was not working.  She also reports that she had difficulty paying for the medications.  She reports she was previously seeing the psychiatrist at [facility] but she stopped a year ago.  She reports her  last suicide attempt was in 2018 and she also had a couple of suicide attempts prior to that.  Per chart review, she had prior hospitalizations at [facility] in 2008 x 2, 2010, 2011 x 3, 2012 x 3, 2014 x 1, 2022 x 2 and most recently in March 2024 at [facility].  She was last seen at [facility] outpatient in April 2024. She reports that she used to cut herself a long time ago but it was in the early 20s.  She does confirm that the last medications that she had were the BuSpar , gabapentin , prazosin , Zoloft  and trazodone  which are prescribed from the behavioral health urgent care and from her last admission at [facility].   She reports a family history of her mom with bipolar and borderline personality disorder and major depressive disorder as well as cannabis use.  She reports her dad has PTSD and depression.  She reports her sister has anxiety and her brother has had maybe 1 psych hospitalization a long time ago.  She reports her mom had several suicide attempts.   She reports she lives with her mom and dad and her dog and cat.  She reports that she did not graduate high school, reports that she got suspended prior to graduating her senior year.  She denies any military history.  She denies that she has any children.  She denies that she is not in any current relationship.  She reports the one with her ex was 2 years ago and they separated due to his infidelity.   Collateral information:  She does not give permission to contact her family for collateral   Past Psychiatric Hx: She reports prior diagnoses of PTSD, bipolar, MDD, GAD, borderline personality disorder.  She reports that she "does not believe any of that crap."  She reports that she stopped medications around a year ago because she felt like it was not working.  She also reports that she had difficulty paying for the medications.  She reports she was previously seeing the psychiatrist at [facility] but she stopped a year ago.  She reports her last suicide attempt was in 2018  and she also had a couple of suicide attempts prior to that.  Per chart review, she had prior hospitalizations at [facility] in  2008 x 2, 2010, 2011 x 3, 2012 x 3, 2014 x 1, 2022 x 2 and most recently in March 2024 at [facility].  She was last seen at [name] outpatient in April 2024. She reports that she used to cut herself a long time ago but it was in the early 20s.  She does confirm that the last medications that she had were the BuSpar , gabapentin , prazosin , Zoloft  and trazodone  which are prescribed from the behavioral health urgent care and from her last admission at [facility].   Substance Abuse Hx: (Frequency, quantity, last use, impact) She reports that she has been using Xanax  for over 8 years for anxiety nonprescribed.  She reports that she has been using 2 mg to 4 mg on a bad day.  She reports that she had a period where she detoxed after she was in jail and stopped taking the meds for over a year.  However she reports that she just started again.  She denies any history of seizures from benzo withdrawal.  She reports that she has gone to rehab for alcohol and drug use she reports that she went once in 2016 and it was helpful and went to an [facility] afterwards.  However she reported that she is then relapsed in 2017.  She also reports alcohol use.  She reports that she has been using alcohol more in the past months she reports that she has used it at least twice.  Prior to last month she said she was only using alcohol couple times a year.  She reports that she was using alcohol more recently because she felt like Xanax  was not enough for her anxiety depression and stress.  She also reports cannabis use every day about 1 to 2 g.  She also reports smoking cigarettes 1 to 2 packs/day.  She is interested in the nicotine  patch.   Past Medical History: PCP: None Medical Dx: Asthma, GERD Medications: Albuterol  as needed, Protonix  as needed Allergies: Reports allergy to Haldol , she reports that it makes her neck tight  and tense and she feels restless Having periods   Family Medical History:      Family History  Problem Relation Age of Onset   Drug abuse Mother     Anxiety disorder Mother     Anxiety disorder Father     Drug abuse Maternal Aunt     Alcohol abuse Maternal Grandfather     Drug abuse Cousin            Family Psychiatric History: She reports a family history of her mom with bipolar and borderline personality disorder and major depressive disorder as well as cannabis use.  She reports her dad has PTSD and depression.  She reports her sister has anxiety and her brother has had maybe 1 psych hospitalization a long time ago.  She reports her mom had several suicide attempts.   Social History: She reports she lives with her mom and dad and her dog and cat.  She reports that she did not graduate high school, reports that she got suspended prior to graduating her senior year.  She denies any military history.  She denies that she has any children.  She denies that she is not in any current relationship.  She reports the one with her ex was 2 years ago and they separated due to his infidelity.  She is currently working at [employer].   Access to firearms: Reports that there is a gun that is locked  up at home Medication stockpile: Denies  Continued Clinical Symptoms:  Alcohol Use Disorder Identification Test Final Score (AUDIT): 12 The "Alcohol Use Disorders Identification Test", Guidelines for Use in Primary Care, Second Edition.  World Science writer [facility]). Score between 0-7:  no or low risk or alcohol related problems. Score between 8-15:  moderate risk of alcohol related problems. Score between 16-19:  high risk of alcohol related problems. Score 20 or above:  warrants further diagnostic evaluation for alcohol dependence and treatment.  CLINICAL FACTORS:   Severe Anxiety and/or Agitation Depression:   Comorbid alcohol abuse/dependence Impulsivity Alcohol/Substance  Abuse/Dependencies Personality Disorders:   Cluster B Previous Psychiatric Diagnoses and Treatments  Musculoskeletal: Strength & Muscle Tone: within normal limits Gait & Station: normal Patient leans: N/A  Musculoskeletal: Strength & Muscle Tone: within normal limits Gait & Station: normal Patient leans: N/A   Psychiatric Specialty Exam:   Presentation  General Appearance: Casual   Eye Contact:Fair   Speech:Clear and Coherent   Speech Volume:Normal   Handedness:Right     Mood and Affect  Mood:Labile   Affect:Congruent     Thought Process  Thought Processes:Coherent   Descriptions of Associations:Intact   Orientation:Full (Time, Place and Person)   Thought Content:Perseveration   History of Schizophrenia/Schizoaffective disorder: None Duration of Psychotic Symptoms:N/A Hallucinations:Hallucinations: None   Ideas of Reference:None   Suicidal Thoughts:Suicidal Thoughts: No   Homicidal Thoughts:Homicidal Thoughts: No     Sensorium  Memory:Immediate Fair   Judgment:Poor   Insight:Shallow     Executive Functions  Concentration:Fair   Attention Span:Fair   Recall:Fair   Fund of Knowledge:Fair   Language:Fair     Psychomotor Activity  Psychomotor Activity:Psychomotor Activity: Normal     Assets  Assets:Communication Skills; Desire for Improvement; Financial Resources/Insurance; Housing     Sleep  Sleep:Sleep: Fair     Physical Exam ROS   Physical Exam Constitutional:      Appearance: the patient is not toxic-appearing.  Pulmonary:     Effort: Pulmonary effort is normal.  Neurological:     General: No focal deficit present.     Mental Status: the patient is alert and oriented to person, place, and time.    Review of Systems  Respiratory:  Negative for shortness of breath.   Cardiovascular:  Negative for chest pain.  Gastrointestinal:  Negative for abdominal pain, constipation, diarrhea, nausea and vomiting.  Neurological:   Negative for headaches.  Endo: Positive for hot flashes   Blood pressure 125/84, pulse 60, temperature 97.8 F (36.6 C), temperature source Oral, resp. rate 20, height 5\' 2"  (1.575 m), weight 62.6 kg, SpO2 99%. Body mass index is 25.24 kg/m.   COGNITIVE FEATURES THAT CONTRIBUTE TO RISK:  Thought constriction (tunnel vision)    SUICIDE RISK:   Moderate:  Frequent suicidal ideation with limited intensity, and duration, some specificity in terms of plans, no associated intent, good self-control, limited dysphoria/symptomatology, some risk factors present, and identifiable protective factors, including available and accessible social support.  PLAN OF CARE: See H&P  ASSESSMENT: See H&P  I certify that inpatient services furnished can reasonably be expected to improve the patient's condition.   This case was discussed with attending Dr. Linnie Riches who agrees with the above formulated treatment plan. Please see attending attestation for additional details.   This note was created using a voice recognition software as a result there may be grammatical errors inadvertently enclosed that do not reflect the nature of this encounter. Every attempt is made to correct such  errors.   Norbert Bean, MD, PGY-2 08/22/2023, 2:19 PM

## 2023-08-22 NOTE — BH IP Treatment Plan (Signed)
 Interdisciplinary Treatment and Diagnostic Plan Update  08/22/2023 Time of Session: 1025AM [name] MRN: [id number]  Principal Diagnosis: <principal problem not specified>  Secondary Diagnoses: Active Problems:   Substance induced mood disorder (HCC)   Current Medications:  Current Facility-Administered Medications  Medication Dose Route Frequency Provider Last Rate Last Admin   acetaminophen  (TYLENOL ) tablet 975 mg  975 mg Oral Q6H PRN [name], NP       alum & mag hydroxide-simeth (MAALOX/MYLANTA) 200-200-20 MG/5ML suspension 30 mL  30 mL Oral Q4H PRN [name], NP       hydrOXYzine  (ATARAX ) tablet 25 mg  25 mg Oral Q6H PRN [name], NP   25 mg at 08/21/23 2052   loperamide  (IMODIUM ) capsule 2-4 mg  2-4 mg Oral PRN [name], NP       LORazepam  (ATIVAN ) tablet 1 mg  1 mg Oral Q6H PRN Chien, Stephanie, MD       LORazepam  (ATIVAN ) tablet 1 mg  1 mg Oral QID Chien, Stephanie, MD       Followed by   Cecily Cohen ON 08/23/2023] LORazepam  (ATIVAN ) tablet 1 mg  1 mg Oral TID Chien, Stephanie, MD       Followed by   Cecily Cohen ON 08/24/2023] LORazepam  (ATIVAN ) tablet 1 mg  1 mg Oral BID Chien, Stephanie, MD       Followed by   Cecily Cohen ON 08/26/2023] LORazepam  (ATIVAN ) tablet 1 mg  1 mg Oral Daily Chien, Stephanie, MD       magnesium  hydroxide (MILK OF MAGNESIA) suspension 30 mL  30 mL Oral Daily PRN [name], NP       nicotine  (NICODERM CQ  - dosed in mg/24 hours) patch 14 mg  14 mg Transdermal Daily Linnie Riches, Nadir, MD   14 mg at 08/22/23 7829   OLANZapine  (ZYPREXA ) injection 10 mg  10 mg Intramuscular TID PRN [name], NP       OLANZapine  (ZYPREXA ) injection 5 mg  5 mg Intramuscular TID PRN [name], NP       OLANZapine  zydis (ZYPREXA ) disintegrating tablet 5 mg  5 mg Oral TID PRN [name], NP       ondansetron  (ZOFRAN -ODT) disintegrating tablet 4 mg  4 mg Oral Q6H PRN [name], NP       sertraline  (ZOLOFT ) tablet 50 mg  50  mg Oral Daily Chien, Stephanie, MD       traZODone  (DESYREL ) tablet 50 mg  50 mg Oral QHS PRN [name], NP       PTA Medications: Medications Prior to Admission  Medication Sig Dispense Refill Last Dose/Taking   albuterol  (VENTOLIN  HFA) 108 (90 Base) MCG/ACT inhaler Inhale 2 puffs into the lungs every 6 (six) hours as needed for wheezing or shortness of breath. 8.5 g 0 Past Month   pantoprazole  (PROTONIX ) 40 MG tablet Take 1 tablet (40 mg total) by mouth daily. (Patient taking differently: Take 40 mg by mouth daily as needed (indigestion).) 30 tablet 0     Patient Stressors: Financial difficulties   Legal issue   Loss of relationship   Marital or family conflict   Substance abuse   Traumatic event    Patient Strengths: Ability for insight  Work skills   Treatment Modalities: Medication Management, Group therapy, Case management,  1 to 1 session with clinician, Psychoeducation, Recreational therapy.   Physician Treatment Plan for Primary Diagnosis: <principal problem not specified> Long Term Goal(s):     Short Term Goals:    Medication Management:  Evaluate patient's response, side effects, and tolerance of medication regimen.  Therapeutic Interventions: 1 to 1 sessions, Unit Group sessions and Medication administration.  Evaluation of Outcomes: Not Progressing  Physician Treatment Plan for Secondary Diagnosis: Active Problems:   Substance induced mood disorder (HCC)  Long Term Goal(s):     Short Term Goals:       Medication Management: Evaluate patient's response, side effects, and tolerance of medication regimen.  Therapeutic Interventions: 1 to 1 sessions, Unit Group sessions and Medication administration.  Evaluation of Outcomes: Not Progressing   RN Treatment Plan for Primary Diagnosis: <principal problem not specified> Long Term Goal(s): Knowledge of disease and therapeutic regimen to maintain health will improve  Short Term Goals: Ability to remain  free from injury will improve, Ability to verbalize frustration and anger appropriately will improve, Ability to demonstrate self-control, Ability to participate in decision making will improve, Ability to verbalize feelings will improve, Ability to disclose and discuss suicidal ideas, Ability to identify and develop effective coping behaviors will improve, and Compliance with prescribed medications will improve  Medication Management: RN will administer medications as ordered by provider, will assess and evaluate patient's response and provide education to patient for prescribed medication. RN will report any adverse and/or side effects to prescribing provider.  Therapeutic Interventions: 1 on 1 counseling sessions, Psychoeducation, Medication administration, Evaluate responses to treatment, Monitor vital signs and CBGs as ordered, Perform/monitor CIWA, COWS, AIMS and Fall Risk screenings as ordered, Perform wound care treatments as ordered.  Evaluation of Outcomes: Not Progressing   LCSW Treatment Plan for Primary Diagnosis: <principal problem not specified> Long Term Goal(s): Safe transition to appropriate next level of care at discharge, Engage patient in therapeutic group addressing interpersonal concerns.  Short Term Goals: Engage patient in aftercare planning with referrals and resources, Increase social support, Increase ability to appropriately verbalize feelings, Increase emotional regulation, Facilitate acceptance of mental health diagnosis and concerns, Facilitate patient progression through stages of change regarding substance use diagnoses and concerns, Identify triggers associated with mental health/substance abuse issues, and Increase skills for wellness and recovery  Therapeutic Interventions: Assess for all discharge needs, 1 to 1 time with Social worker, Explore available resources and support systems, Assess for adequacy in community support network, Educate family and significant  other(s) on suicide prevention, Complete Psychosocial Assessment, Interpersonal group therapy.  Evaluation of Outcomes: Not Progressing   Progress in Treatment: Attending groups: Yes. Participating in groups: Yes. Taking medication as prescribed: Yes. Toleration medication: Yes. Family/Significant other contact made: No, will contact:  consents pending Patient understands diagnosis: Yes. Discussing patient identified problems/goals with staff: Yes. Medical problems stabilized or resolved: Yes. Denies suicidal/homicidal ideation: Yes. Issues/concerns per patient self-inventory: No.  New problem(s) identified: No, Describe:  none  New Short Term/Long Term Goal(s): detox, medication management for mood stabilization; elimination of SI thoughts; development of comprehensive mental wellness/sobriety plan   Patient Goals:  "Lower my depression and anxiety, manage my medications"  Discharge Plan or Barriers: Patient recently admitted. CSW will continue to follow and assess for appropriate referrals and possible discharge planning.    Reason for Continuation of Hospitalization: Depression Medication stabilization Suicidal ideation  Estimated Length of Stay: 5-7 days  Last 3 [place] Suicide Severity Risk Score: Flowsheet Row [facility] Most recent reading at 08/21/2023  4:47 PM ED from 08/21/2023 in [facility] Emergency Department at [facility] Most recent reading at 08/21/2023 12:30 AM Admission (Discharged) from 06/27/2022 in  [facility] Most recent reading at 06/28/2022 10:22 AM  C-SSRS RISK CATEGORY Moderate Risk Moderate Risk Low Risk       Last PHQ 2/9 Scores:    05/18/2022    4:21 PM 01/11/2022   10:38 AM 01/10/2022   10:38 AM  Depression screen PHQ 2/9  Decreased Interest 3 3 3   Down, Depressed, Hopeless 3 3 3   PHQ - 2 Score 6 6 6   Altered sleeping 3 2 2   Tired,  decreased energy 2 1 1   Change in appetite 2 2 3   Feeling bad or failure about yourself  3 3 3   Trouble concentrating 2 3 3   Moving slowly or fidgety/restless 2 3 2   Suicidal thoughts 1 1 2   PHQ-9 Score 21 21 22   Difficult doing work/chores Very difficult Extremely dIfficult Extremely dIfficult    Scribe for Treatment Team: Rheta Celestine 08/22/2023 1:54 PM
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
## 2023-08-23 DIAGNOSIS — F332 Major depressive disorder, recurrent severe without psychotic features: Secondary | ICD-10-CM | POA: Diagnosis not present

## 2023-08-23 LAB — LIPID PANEL
Cholesterol: 152 mg/dL (ref 0–200)
HDL: 51 mg/dL (ref >40)
LDL Cholesterol: 79 mg/dL (ref 0–99)
Total CHOL/HDL Ratio: 3 ratio
Triglycerides: 109 mg/dL (ref <150)
VLDL: 22 mg/dL (ref 0–40)

## 2023-08-23 LAB — HEMOGLOBIN A1C
Hgb A1c MFr Bld: 4.9 % (ref 4.8–5.6)
Mean Plasma Glucose: 93.93 mg/dL

## 2023-08-23 MED ORDER — HYDROXYZINE HCL 25 MG PO TABS
25.0000 mg | ORAL_TABLET | Freq: Every evening | ORAL | Status: DC | PRN
  Administered 2023-08-23 – 2023-08-26 (×5): 25 mg via ORAL
  Filled 2023-08-23 (×12): qty 1
  Filled 2023-08-23: qty 10

## 2023-08-23 NOTE — Progress Notes (Signed)
   08/23/23 2200  Psych Admission Type (Psych Patients Only)  Admission Status Involuntary  Psychosocial Assessment  Patient Complaints Anxiety  Eye Contact Fair  Facial Expression Animated  Affect Appropriate to circumstance  Speech Logical/coherent  Interaction Assertive  Motor Activity Slow  Appearance/Hygiene Unremarkable  Behavior Characteristics Appropriate to situation;Cooperative  Mood Pleasant  Thought Process  Coherency WDL  Content WDL  Delusions None reported or observed  Perception WDL  Hallucination None reported or observed  Judgment Impaired  Confusion None  Danger to Self  Current suicidal ideation? Denies  Danger to Others  Danger to Others None reported or observed

## 2023-08-23 NOTE — Group Note (Signed)
 LCSW Group Therapy Note   Group Date: 08/23/2023 Start Time: 1100 End Time: 1200   Participation:  patient was present  Type of Therapy:  Group Therapy  Topic:  Understanding your Path to Change     Objective:  The goal is to help individuals understand the stages of change, identify where they currently are in the process, and provide actionable next steps to continue moving forward in their journey of change.  Goals: - Learn about the six stages of change:  Precontemplation, Contemplation, Preparation, Action, Maintenance, and Relapse - Reflect on Current Change Efforts:  Recognize which stage participants are in regarding a personal change. - Plan Next Steps for Moving Forward:  Create an action plan based on their current stage of change.  Class Summary: In this session, we explored the Stages of Change as a framework to understand the process of change.  We discussed how each stage helps individuals recognize where they are in their personal journey and used the Stages of Change Worksheet for self-reflection. Participants answered questions to better understand their current stage, challenges, and progress. We also emphasized the importance of moving forward, even if setbacks (Relapse) occur, and created actionable steps to help participants continue progressing. By the end of the session, participants gained a clearer understanding of their path to change and left with a clear plan for next steps.  Modalities:  Elements of CBT (cognitive restructuring, problem solving)  Element of DBT (mindfulness, distress tolerance)   [name], LCSWA 08/23/2023  2:46 PM

## 2023-08-23 NOTE — Plan of Care (Signed)

## 2023-08-23 NOTE — Group Note (Signed)
 Occupational Therapy Group Note  Group Topic: Sleep Hygiene  Group Date: 08/23/2023 Start Time: 1400 End Time: 1430 Facilitators: Lynnda Sas, OT   Group Description: Group encouraged increased participation and engagement through topic focused on sleep hygiene. Patients reflected on the quality of sleep they typically receive and identified areas that need improvement. Group was given background information on sleep and sleep hygiene, including common sleep disorders. Group members also received information on how to improve one's sleep and introduced a sleep diary as a tool that can be utilized to track sleep quality over a length of time. Group session ended with patients identifying one or more strategies they could utilize or implement into their sleep routine in order to improve overall sleep quality.        Therapeutic Goal(s):  Identify one or more strategies to improve overall sleep hygiene  Identify one or more areas of sleep that are negatively impacted (sleep too much, too little, etc)     Participation Level: Engaged   Participation Quality: Independent   Behavior: Appropriate   Speech/Thought Process: Relevant   Affect/Mood: Appropriate   Insight: Fair   Judgement: Fair      Modes of Intervention: Education  Patient Response to Interventions:  Attentive   Plan: Continue to engage patient in OT groups 2 - 3x/week.  08/23/2023  Lynnda Sas, OT   [name], OT

## 2023-08-23 NOTE — Progress Notes (Addendum)
 [facility] MD Progress Note  08/23/2023 6:44 AM [name]  MRN:  [id number]  Principal Problem: <principal problem not specified> Diagnosis: Active Problems:   Substance induced mood disorder (HCC)  Reason for Admission:  [name] is a 43 y.o. female  with a past psychiatric history of borderline personality disorder, benzodiazepine dependence, alcohol use disorder, major depressive disorder. Patient initially arrived to [facility] on 5/13 under involuntary commitment after they received a message from her friend that she had texted plans to jump off a bridge, and admitted to [facility] under IVC on May 13 for acute safety concerns, crisis stabalization, and acute suicidal or self-harming behaviors. PPHx is significant for history of multiple suicide Attempts and multiple prior Psychiatric Hospitalizations. PMHx is significant for none.   (admitted on 08/21/2023, total  LOS: 2 days )  Chart review: Overnight Events Vital signs: stable  MAR was reviewed and patient was compliant with medications.  Patient received PRN zofran  CIWA: 4, 2 (anxiety, N/V, HA)  Labs: pending Sleep: 7 hours Nursing notes /groups: attended 3/4 groups  Pertinent information discussed during bed progression:  Case was discussed in the multidisciplinary team. Nursing report patient slept 7 hours and denied SI/HI/AVH. Reports CIWA 2.   Information Obtained Today During Patient Interview: Patient evaluated on the unit. She reports she slept fair, reports don't want trazodone  or melatonin because of vivid dreams. Reports she was not up all night but also didn't have deep sleep. Discussed can schedule hydroxyzine  for sleep. She reports she ate breakfast. She reports withdrawal symptoms of diarrhea, muscle cramps, and hot flashes. She reports this time withdrawal symptoms are less than previously. She reports her mood is better, reports she is not as completely hopeless, is feeling more mellow. She reports she hasn't  talked with anyone but plans to talk with her dad today. She reports her goal is to work on discharge including getting connected with outpatient therapy and CDIOP. She reports changes after discharge including plan to not use xanax  anymore. Discussed importance of keeping a list of what she is grateful for and setting aside time for this. She denies SI/HI/AVH.   Past Psychiatric Hx: She reports prior diagnoses of PTSD, bipolar, MDD, GAD, borderline personality disorder.  She reports that she "does not believe any of that crap."  She reports that she stopped medications around a year ago because she felt like it was not working.  She also reports that she had difficulty paying for the medications.  She reports she was previously seeing the psychiatrist at [facility] but she stopped a year ago.  She reports her last suicide attempt was in 2018 and she also had a couple of suicide attempts prior to that.  Per chart review, she had prior hospitalizations at [facility] in 2008 x 2, 2010, 2011 x 3, 2012 x 3, 2014 x 1, 2022 x 2 and most recently in March 2024 at [facility].  She was last seen at [facility] outpatient in April 2024. She reports that she used to cut herself a long time ago but it was in the early 20s.  She does confirm that the last medications that she had were the BuSpar , gabapentin , prazosin , Zoloft  and trazodone  which are prescribed from the behavioral health urgent care and from her last admission at [facility].   Substance Abuse Hx: (Frequency, quantity, last use, impact) She reports that she has been using Xanax  for over 8 years for anxiety nonprescribed.  She reports that she has been using 2 mg  to 4 mg on a bad day.  She reports that she had a period where she detoxed after she was in jail and stopped taking the meds for over a year.  However she reports that she just started again.  She denies any history of seizures from benzo withdrawal.  She reports that she has gone to rehab for alcohol and drug use she reports that  she went once in 2016 and it was helpful and went to an [facility] afterwards.  However she reported that she is then relapsed in 2017.  She also reports alcohol use.  She reports that she has been using alcohol more in the past months she reports that she has used it at least twice.  Prior to last month she said she was only using alcohol couple times a year.  She reports that she was using alcohol more recently because she felt like Xanax  was not enough for her anxiety depression and stress.  She also reports cannabis use every day about 1 to 2 g.  She also reports smoking cigarettes 1 to 2 packs/day.  She is interested in the nicotine  patch.   Past Medical History: PCP: None Medical Dx: Asthma, GERD Medications: Albuterol  as needed, Protonix  as needed Allergies: Reports allergy to Haldol , she reports that it makes her neck tight and tense and she feels restless Having periods   Family Medical History:      Family History  Problem Relation Age of Onset   Drug abuse Mother     Anxiety disorder Mother     Anxiety disorder Father     Drug abuse Maternal Aunt     Alcohol abuse Maternal Grandfather     Drug abuse Cousin            Family Psychiatric History: She reports a family history of her mom with bipolar and borderline personality disorder and major depressive disorder as well as cannabis use.  She reports her dad has PTSD and depression.  She reports her sister has anxiety and her brother has had maybe 1 psych hospitalization a long time ago.  She reports her mom had several suicide attempts.   Social History: She reports she lives with her mom and dad and her dog and cat.  She reports that she did not graduate high school, reports that she got suspended prior to graduating her senior year.  She denies any military history.  She denies that she has any children.  She denies that she is not in any current relationship.  She reports the one with her ex was 2 years ago and they  separated due to his infidelity.  She is currently working at [employer].   Access to firearms: Reports that there is a gun that is locked up at home Medication stockpile: Denies   Past Medical History:  Past Medical History:  Diagnosis Date   Anxiety    Benzodiazepine abuse (HCC)    Bipolar 1 disorder (HCC)    Depression    Drug abuse (HCC)    ETOH abuse    Polysubstance abuse (HCC)    Family History:  Family History  Problem Relation Age of Onset   Drug abuse Mother    Anxiety disorder Mother    Anxiety disorder Father    Drug abuse Maternal Aunt    Alcohol abuse Maternal Grandfather    Drug abuse Cousin     Current Medications: Current Facility-Administered Medications  Medication Dose Route Frequency Provider Last Rate  Last Admin   acetaminophen  (TYLENOL ) tablet 975 mg  975 mg Oral Q6H PRN [name], NP       alum & mag hydroxide-simeth (MAALOX/MYLANTA) 200-200-20 MG/5ML suspension 30 mL  30 mL Oral Q4H PRN [name], NP       hydrOXYzine  (ATARAX ) tablet 25 mg  25 mg Oral Q6H PRN [name], NP   25 mg at 08/21/23 2052   loperamide  (IMODIUM ) capsule 2-4 mg  2-4 mg Oral PRN [name], NP       LORazepam  (ATIVAN ) tablet 1 mg  1 mg Oral Q6H PRN Norbert Bean, MD       LORazepam  (ATIVAN ) tablet 1 mg  1 mg Oral QID Adryen Cookson, MD   1 mg at 08/22/23 2113   Followed by   LORazepam  (ATIVAN ) tablet 1 mg  1 mg Oral TID Bairon Klemann, MD       Followed by   Cecily Cohen ON 08/24/2023] LORazepam  (ATIVAN ) tablet 1 mg  1 mg Oral BID Jake Fuhrmann, MD       Followed by   Cecily Cohen ON 08/26/2023] LORazepam  (ATIVAN ) tablet 1 mg  1 mg Oral Daily Keelin Sheridan, MD       magnesium  hydroxide (MILK OF MAGNESIA) suspension 30 mL  30 mL Oral Daily PRN [name], NP       nicotine  (NICODERM CQ  - dosed in mg/24 hours) patch 21 mg  21 mg Transdermal Daily Gianlucas Evenson, MD       OLANZapine  (ZYPREXA ) injection 10 mg  10 mg Intramuscular TID PRN [name], NP       OLANZapine  (ZYPREXA ) injection 5 mg  5 mg Intramuscular TID PRN [name], NP       OLANZapine  zydis (ZYPREXA ) disintegrating tablet 5 mg  5 mg Oral TID PRN [name], NP       ondansetron  (ZOFRAN -ODT) disintegrating tablet 4 mg  4 mg Oral Q6H PRN [name], NP   4 mg at 08/22/23 2113   sertraline  (ZOLOFT ) tablet 50 mg  50 mg Oral Daily Ariele Vidrio, MD   50 mg at 08/22/23 1611   traZODone  (DESYREL ) tablet 50 mg  50 mg Oral QHS PRN [name], NP        Lab Results: No results found for this or any previous visit (from the past 48 hours).  Blood Alcohol level:  Lab Results  Component Value Date   ETH 172 (H) 08/21/2023   ETH 156 (H) 06/27/2022    Metabolic Labs: Lab Results  Component Value Date   HGBA1C 5.2 06/27/2022   MPG 103 06/27/2022   MPG 102.54 07/12/2020   No results found for: "PROLACTIN" Lab Results  Component Value Date   CHOL 158 06/27/2022   TRIG 77 06/27/2022   HDL 59 06/27/2022   CHOLHDL 2.7 06/27/2022   VLDL 15 06/27/2022   LDLCALC 84 06/27/2022   LDLCALC 73 07/12/2020    Sleep:Sleep: Fair   Physical Findings: AIMS: No  CIWA:  CIWA-Ar Total: 0 COWS:     Psychiatric Specialty Exam:  Presentation  General Appearance: Casual  Eye Contact:Fair  Speech:Clear and Coherent  Speech Volume:Normal  Handedness:Right   Mood and Affect  Mood: "better"  Affect:Congruent   Thought Process  Thought Processes:Coherent  Descriptions of Associations:Intact  Orientation:Full (Time, Place and Person)  Thought Content:Perseveration  History of Schizophrenia/Schizoaffective disorder: None Duration of Psychotic Symptoms:None  Hallucinations:Hallucinations: None  Ideas of Reference:None  Suicidal Thoughts:Suicidal Thoughts: No  Homicidal Thoughts:Homicidal Thoughts: No  Sensorium  Memory:Immediate Fair  Judgment:Poor  Insight:Shallow   Executive Functions   Concentration:Fair  Attention Span:Fair  Recall:Fair  Fund of Knowledge:Fair  Language:Fair   Psychomotor Activity  Psychomotor Activity:Psychomotor Activity: Normal   Assets  Assets:Communication Skills; Desire for Improvement; Financial Resources/Insurance; Housing   Sleep  Sleep:Sleep: Fair   Physical Exam ROS  Physical Exam Constitutional:      Appearance: the patient is not toxic-appearing.  Pulmonary:     Effort: Pulmonary effort is normal.  Neurological:     General: No focal deficit present.     Mental Status: the patient is alert and oriented to person, place, and time.   Review of Systems  Respiratory:  Negative for shortness of breath.   Cardiovascular:  Negative for chest pain.  Gastrointestinal:  Negative for abdominal pain, constipation, nausea and vomiting. Positive for diarrhea Neurological:  Negative for headaches.  MSK: positive for myalgias.   Blood pressure 130/82, pulse 73, temperature 98.4 F (36.9 C), temperature source Oral, resp. rate 20, height 5\' 2"  (1.575 m), weight 62.6 kg, SpO2 99%. Body mass index is 25.24 kg/m.  Treatment Plan Summary: Daily contact with patient to assess and evaluate symptoms and progress in treatment, Medication management, and Plan    ASSESSMENT: [name] is a 43 y.o. female  with a past psychiatric history of borderline personality disorder, benzodiazepine dependence, alcohol use disorder, major depressive disorder. Patient initially arrived to [facility] on 5/13 under involuntary commitment after they received a message from her friend that she had texted plans to jump off a bridge, and admitted to [facility] under IVC on May 13 for acute safety concerns, crisis stabalization, and acute suicidal or self-harming behaviors. PPHx is significant for history of multiple suicide Attempts and multiple prior Psychiatric Hospitalizations. PMHx is significant for none.   Patient appears to be going through  benzodiazepine withdrawal as evidenced by her report of withdrawal symptoms. Despite this her mood does seem to be improved. Reporting poor sleep, so will schedule hydroxyzine  for her at bedtime. Reports willingness to engage in CDIOP after discharge.   Diagnoses / Active Problems: Borderline personality disorder MDD, recurrent, severe Generalized anxiety disorder Alcohol use disorder Sedative, hypnotic, or anxiolytic use disorder Cannabis use disorder Tobacco use disorder  PLAN: Safety and Monitoring:  --  INVOLUNTARY admission to inpatient psychiatric unit for safety, stabilization and treatment  -- Daily contact with patient to assess and evaluate symptoms and progress in treatment  -- Patient's case to be discussed in multi-disciplinary team meeting  -- Observation Level : q15 minute checks  -- Vital signs:  q12 hours  -- Precautions: suicide, elopement, and assault  2. Psychiatric Diagnoses and Treatment:   --Continue zoloft  50mg  for depression and anxiety                     --Continue ativan  taper for ongoing BZD use             --continue CIWA with PRN ativan  for BZD withdrawal             --continue to encourage cessation from substances  --start hydroxyzine  25mg  at bedtime x1 PRN for insomnia  -- The risks/benefits/side-effects/alternatives to this medication were discussed in detail with the patient and time was given for questions. The patient consents to medication trial.              -- Metabolic profile and EKG monitoring obtained while on an atypical antipsychotic  BMI: Body mass index  is 25.24 kg/m. TSH: pending Lipid Panel: pending HbgA1c:  pending  QTc: 419 (May 13)              -- Encouraged patient to participate in unit milieu and in scheduled group therapies   -- Short Term Goals: Ability to identify changes in lifestyle to reduce recurrence of condition will improve, Ability to verbalize feelings will improve, Ability to disclose and discuss suicidal ideas,  Ability to demonstrate self-control will improve, Ability to identify and develop effective coping behaviors will improve, Ability to maintain clinical measurements within normal limits will improve, Compliance with prescribed medications will improve, and Ability to identify triggers associated with substance abuse/mental health issues will improve   -- Long Term Goals: Improvement in symptoms so as ready for discharge   Other PRNS:  acetaminophen , alum & mag hydroxide-simeth, hydrOXYzine , loperamide , LORazepam , magnesium  hydroxide, OLANZapine , OLANZapine , OLANZapine  zydis, ondansetron , traZODone   -- As needed agitation protocol in-place   3. Medical Issues Being Addressed:   #Tobacco Use Disorder  Nicotine  patch 21mg /24 hours ordered  Smoking cessation encouraged  4. Discharge Planning:   -- Social work and case management to assist with discharge planning and identification of hospital follow-up needs prior to discharge  -- Estimated LOS: 5/19  -- Discharge Concerns: Need to establish a safety plan; Medication compliance and effectiveness  -- Discharge Goals: Return home with outpatient referrals for mental health follow-up including medication management/psychotherapy   I certify that inpatient services furnished can reasonably be expected to improve the patient's condition.   This note was created using a voice recognition software as a result there may be grammatical errors inadvertently enclosed that do not reflect the nature of this encounter. Every attempt is made to correct such errors.   This case was discussed with attending Dr. Linnie Riches who agrees with the above formulated treatment plan. Please see attending attestation for additional details.   Dr. Norbert Bean, MD PGY-2, Psychiatry Residency  5/15/20256:44 AM

## 2023-08-23 NOTE — Group Note (Signed)
 Date:  08/23/2023 Time:  9:17 PM  Group Topic/Focus:  Wrap-Up Group:   The focus of this group is to help patients review their daily goal of treatment and discuss progress on daily workbooks.    Participation Level:  Minimal  Participation Quality:  Appropriate  Affect:  Appropriate  Cognitive:  Appropriate  Insight: None  Engagement in Group:  Engaged  Modes of Intervention:  Activity and Socialization  Additional Comments:  Patient attended wrap up group and participated in activity but did not share.   Dillard Frame 08/23/2023, 9:17 PM

## 2023-08-23 NOTE — Progress Notes (Signed)
   08/22/23 2148  Psych Admission Type (Psych Patients Only)  Admission Status Involuntary  Psychosocial Assessment  Patient Complaints Anxiety;Depression;Substance abuse  Eye Contact Fair  Facial Expression Anxious;Flat  Affect Flat;Anxious  Speech Logical/coherent  Interaction Assertive  Motor Activity Slow  Appearance/Hygiene Unremarkable  Behavior Characteristics Appropriate to situation  Mood Depressed;Anxious  Aggressive Behavior  Effect No apparent injury  Thought Process  Coherency WDL  Content WDL  Delusions None reported or observed  Perception WDL  Hallucination None reported or observed  Judgment Impaired  Confusion None  Danger to Self  Current suicidal ideation? Denies  Danger to Others  Danger to Others None reported or observed

## 2023-08-23 NOTE — Progress Notes (Signed)
   08/23/23 1000  Psych Admission Type (Psych Patients Only)  Admission Status Involuntary  Psychosocial Assessment  Patient Complaints Substance abuse  Eye Contact Fair  Facial Expression Anxious  Affect Anxious  Speech Logical/coherent  Interaction Assertive  Motor Activity Slow  Appearance/Hygiene Unremarkable  Behavior Characteristics Appropriate to situation  Mood Anxious  Thought Process  Coherency WDL  Content WDL  Delusions None reported or observed  Perception WDL  Hallucination None reported or observed  Judgment Impaired  Confusion None  Danger to Self  Current suicidal ideation? Denies  Danger to Others  Danger to Others None reported or observed

## 2023-08-24 DIAGNOSIS — F332 Major depressive disorder, recurrent severe without psychotic features: Secondary | ICD-10-CM | POA: Diagnosis not present

## 2023-08-24 MED ORDER — SERTRALINE HCL 50 MG PO TABS
50.0000 mg | ORAL_TABLET | Freq: Once | ORAL | Status: AC
Start: 2023-08-24 — End: 2023-08-24
  Administered 2023-08-24: 50 mg via ORAL
  Filled 2023-08-24: qty 1

## 2023-08-24 MED ORDER — SERTRALINE HCL 100 MG PO TABS
100.0000 mg | ORAL_TABLET | Freq: Every day | ORAL | Status: DC
  Filled 2023-08-24 (×2): qty 1

## 2023-08-24 NOTE — Progress Notes (Signed)
   08/24/23 0915  Psych Admission Type (Psych Patients Only)  Admission Status Involuntary  Psychosocial Assessment  Patient Complaints Anxiety;Depression;Substance abuse  Eye Contact Fair  Facial Expression Animated  Affect Appropriate to circumstance  Speech Logical/coherent  Interaction Assertive  Motor Activity Slow  Appearance/Hygiene Unremarkable  Behavior Characteristics Appropriate to situation;Cooperative  Mood Depressed;Anxious;Pleasant  Thought Process  Coherency WDL  Content WDL  Delusions None reported or observed  Perception WDL  Hallucination None reported or observed  Judgment Impaired  Confusion None  Danger to Self  Current suicidal ideation? Denies  Danger to Others  Danger to Others None reported or observed

## 2023-08-24 NOTE — Progress Notes (Signed)
 Adult Psychoeducational Group Note  Date:  08/24/2023 Time:  8:32 PM  Group Topic/Focus:  Wrap-Up Group:   The focus of this group is to help patients review their daily goal of treatment and discuss progress on daily workbooks.  Participation Level:  Active  Participation Quality:  Appropriate  Affect:  Appropriate  Cognitive:  Appropriate  Insight: Appropriate  Engagement in Group:  Engaged  Modes of Intervention:  Discussion  Additional Comments:  Attend wrap up AA group.  [name] 08/24/2023, 8:32 PM

## 2023-08-24 NOTE — [facility] Group Notes (Signed)
 The focus of this group is to help patients establish daily goals to achieve during treatment and discuss how the patient can incorporate goal setting into their daily lives to aide in recovery.         Scale 1-10  10    Goal: thinking about more positive

## 2023-08-24 NOTE — Progress Notes (Signed)
 Adult Psychoeducational Group Note  Date:  08/24/2023 Time:  8:23 PM  Group Topic/Focus:  Wrap-Up Group:   The focus of this group is to help patients review their daily goal of treatment and discuss progress on daily workbooks.  Participation Level:  Active  Participation Quality:  Appropriate  Affect:  Appropriate  Cognitive:  Appropriate  Insight: Appropriate  Engagement in Group:  Engaged  Modes of Intervention:  Discussion  Additional Comments:  Attend wrap up group AA.  [name] 08/24/2023, 8:23 PM

## 2023-08-24 NOTE — Plan of Care (Signed)
 ?  Problem: Activity: ?Goal: Interest or engagement in activities will improve ?Outcome: Progressing ?Goal: Sleeping patterns will improve ?Outcome: Progressing ?  ?Problem: Coping: ?Goal: Ability to verbalize frustrations and anger appropriately will improve ?Outcome: Progressing ?Goal: Ability to demonstrate self-control will improve ?Outcome: Progressing ?  ?Problem: Safety: ?Goal: Periods of time without injury will increase ?Outcome: Progressing ?  ?

## 2023-08-24 NOTE — Group Note (Signed)
 Recreation Therapy Group Note   Group Topic:Problem Solving  Group Date: 08/24/2023 Start Time: 0935 End Time: 0958 Facilitators: Felisa Zechman-McCall, LRT,CTRS Location: [address] Dayroom   Group Topic: Communication, Team Building, Problem Solving  Goal Area(s) Addresses:  Patient will effectively work with peer towards shared goal.  Patient will identify skills used to make activity successful.  Patient will identify how skills used during activity can be used to reach post d/c goals.   Intervention: STEM Activity  Activity: Stage manager. In teams of 3-5, patients were given 12 plastic drinking straws and an equal length of masking tape. Using the materials provided, patients were asked to build a landing pad to catch a golf ball dropped from approximately 5 feet in the air. All materials were required to be used by the team in their design. LRT facilitated post-activity discussion.  Education: Pharmacist, community, Scientist, physiological, Discharge Planning   Education Outcome: Acknowledges education/In group clarification offered/Needs additional education.    Affect/Mood: N/A   Participation Level: Did not attend    Clinical Observations/Individualized Feedback:     Plan: Continue to engage patient in RT group sessions 2-3x/week.   [name], LRT,CTRS 08/24/2023 11:59 AM

## 2023-08-24 NOTE — Progress Notes (Signed)
 [facility] MD Progress Note  08/24/2023 6:38 AM [name]  MRN:  [id number]  Principal Problem: <principal problem not specified> Diagnosis: Active Problems:   Substance induced mood disorder (HCC)  Reason for Admission:  [name] is a 43 y.o. female  with a past psychiatric history of borderline personality disorder, benzodiazepine dependence, alcohol use disorder, major depressive disorder. Patient initially arrived to [facility] on 5/13 under involuntary commitment after they received a message from her friend that she had texted plans to jump off a bridge, and admitted to [facility] under IVC on May 13 for acute safety concerns, crisis stabalization, and acute suicidal or self-harming behaviors. PPHx is significant for history of multiple suicide Attempts and multiple prior Psychiatric Hospitalizations. PMHx is significant for none.   (admitted on 08/21/2023, total  LOS: 3 days )  Chart review: Overnight Events Vital signs: stable  MAR was reviewed and patient was compliant with medications.  Patient received no PRN CIWA: 0 Labs: lipid panel wnl, A1c wnl Sleep: 7 hours Nursing notes /groups: attended 3/3 groups  Pertinent information discussed during bed progression:  Case was discussed in the multidisciplinary team. Nursing report patient slept 7 hours and denied SI/HI/AVH. Reports CIWA 0.   Information Obtained Today During Patient Interview: Patient evaluated in her room. She reports no issues with sleep, states the hydroxyzine  helped and she felt less anxious overnight as well. She reports no issues with appetite. She reports withdrawal symptoms have decreased and she is feeling better, she was stating she did not even think that she needed the ativan . She reports no more diarrhea. Discussed the ativan  taper schedule with patient. She reports improved mood though still reports some anxiety. Discussed will increase zoloft . She reports some coping skills including having more  positive thoughts regarding herself and situations including possible job loss. She reports plan to not continue xanax  at time of discharge. She denies current SI/HI/AVH. She gives permission to contact her father for safety planning.   Past Psychiatric Hx: She reports prior diagnoses of PTSD, bipolar, MDD, GAD, borderline personality disorder.  She reports that she "does not believe any of that crap."  She reports that she stopped medications around a year ago because she felt like it was not working.  She also reports that she had difficulty paying for the medications.  She reports she was previously seeing the psychiatrist at [facility] but she stopped a year ago.  She reports her last suicide attempt was in 2018 and she also had a couple of suicide attempts prior to that.  Per chart review, she had prior hospitalizations at [facility] in 2008 x 2, 2010, 2011 x 3, 2012 x 3, 2014 x 1, 2022 x 2 and most recently in March 2024 at [facility].  She was last seen at [facility] outpatient in April 2024. She reports that she used to cut herself a long time ago but it was in the early 20s.  She does confirm that the last medications that she had were the BuSpar , gabapentin , prazosin , Zoloft  and trazodone  which are prescribed from the behavioral health urgent care and from her last admission at [facility].   Substance Abuse Hx: (Frequency, quantity, last use, impact) She reports that she has been using Xanax  for over 8 years for anxiety nonprescribed.  She reports that she has been using 2 mg to 4 mg on a bad day.  She reports that she had a period where she detoxed after she was in jail and stopped taking the  meds for over a year.  However she reports that she just started again.  She denies any history of seizures from benzo withdrawal.  She reports that she has gone to rehab for alcohol and drug use she reports that she went once in 2016 and it was helpful and went to an [place] [facility] afterwards.  However she reported that she is then relapsed  in 2017.  She also reports alcohol use.  She reports that she has been using alcohol more in the past months she reports that she has used it at least twice.  Prior to last month she said she was only using alcohol couple times a year.  She reports that she was using alcohol more recently because she felt like Xanax  was not enough for her anxiety depression and stress.  She also reports cannabis use every day about 1 to 2 g.  She also reports smoking cigarettes 1 to 2 packs/day.  She is interested in the nicotine  patch.   Past Medical History: PCP: None Medical Dx: Asthma, GERD Medications: Albuterol  as needed, Protonix  as needed Allergies: Reports allergy to Haldol , she reports that it makes her neck tight and tense and she feels restless Having periods   Family Medical History:      Family History  Problem Relation Age of Onset   Drug abuse Mother     Anxiety disorder Mother     Anxiety disorder Father     Drug abuse Maternal Aunt     Alcohol abuse Maternal Grandfather     Drug abuse Cousin            Family Psychiatric History: She reports a family history of her mom with bipolar and borderline personality disorder and major depressive disorder as well as cannabis use.  She reports her dad has PTSD and depression.  She reports her sister has anxiety and her brother has had maybe 1 psych hospitalization a long time ago.  She reports her mom had several suicide attempts.   Social History: She reports she lives with her mom and dad and her dog and cat.  She reports that she did not graduate high school, reports that she got suspended prior to graduating her senior year.  She denies any military history.  She denies that she has any children.  She denies that she is not in any current relationship.  She reports the one with her ex was 2 years ago and they separated due to his infidelity.  She is currently working at [employer].   Access to firearms: Reports that there is a gun that is  locked up at home Medication stockpile: Denies   Past Medical History:  Past Medical History:  Diagnosis Date   Anxiety    Benzodiazepine abuse (HCC)    Bipolar 1 disorder (HCC)    Depression    Drug abuse (HCC)    ETOH abuse    Polysubstance abuse (HCC)    Family History:  Family History  Problem Relation Age of Onset   Drug abuse Mother    Anxiety disorder Mother    Anxiety disorder Father    Drug abuse Maternal Aunt    Alcohol abuse Maternal Grandfather    Drug abuse Cousin     Current Medications: Current Facility-Administered Medications  Medication Dose Route Frequency Provider Last Rate Last Admin   acetaminophen  (TYLENOL ) tablet 975 mg  975 mg Oral Q6H PRN [name], NP       alum & mag  hydroxide-simeth (MAALOX/MYLANTA) 200-200-20 MG/5ML suspension 30 mL  30 mL Oral Q4H PRN [name], NP       hydrOXYzine  (ATARAX ) tablet 25 mg  25 mg Oral Q6H PRN [name], NP   25 mg at 08/21/23 2052   hydrOXYzine  (ATARAX ) tablet 25 mg  25 mg Oral QHS,MR X 1 Majestic Molony, MD   25 mg at 08/23/23 2140   loperamide  (IMODIUM ) capsule 2-4 mg  2-4 mg Oral PRN [name], NP       LORazepam  (ATIVAN ) tablet 1 mg  1 mg Oral Q6H PRN Hadli Vandemark, MD       LORazepam  (ATIVAN ) tablet 1 mg  1 mg Oral TID Eduardo Honor, MD   1 mg at 08/23/23 1706   Followed by   LORazepam  (ATIVAN ) tablet 1 mg  1 mg Oral BID Lashawndra Lampkins, MD       Followed by   Cecily Cohen ON 08/26/2023] LORazepam  (ATIVAN ) tablet 1 mg  1 mg Oral Daily Rashmi Tallent, MD       magnesium  hydroxide (MILK OF MAGNESIA) suspension 30 mL  30 mL Oral Daily PRN [name], NP       nicotine  (NICODERM CQ  - dosed in mg/24 hours) patch 21 mg  21 mg Transdermal Daily Carson Bogden, MD   21 mg at 08/23/23 0834   OLANZapine  (ZYPREXA ) injection 10 mg  10 mg Intramuscular TID PRN [name], NP       OLANZapine  (ZYPREXA ) injection 5 mg  5 mg Intramuscular TID PRN [name], NP        OLANZapine  zydis (ZYPREXA ) disintegrating tablet 5 mg  5 mg Oral TID PRN [name], NP       ondansetron  (ZOFRAN -ODT) disintegrating tablet 4 mg  4 mg Oral Q6H PRN [name], NP   4 mg at 08/22/23 2113   sertraline  (ZOLOFT ) tablet 50 mg  50 mg Oral Daily Witt Plitt, MD   50 mg at 08/23/23 0836   traZODone  (DESYREL ) tablet 50 mg  50 mg Oral QHS PRN [name], NP        Lab Results:  Results for orders placed or performed during the hospital encounter of 08/21/23 (from the past 48 hours)  Hemoglobin A1c     Status: None   Collection Time: 08/23/23  6:46 PM  Result Value Ref Range   Hgb A1c MFr Bld 4.9 4.8 - 5.6 %    Comment: (NOTE) Pre diabetes:          5.7%-6.4%  Diabetes:              >6.4%  Glycemic control for   <7.0% adults with diabetes    Mean Plasma Glucose 93.93 mg/dL    Comment: Performed at [facility], [address], [place], [place] [address]  Lipid panel     Status: None   Collection Time: 08/23/23  6:46 PM  Result Value Ref Range   Cholesterol 152 0 - 200 mg/dL   Triglycerides 213 <086 mg/dL   HDL 51 >57 mg/dL   Total CHOL/HDL Ratio 3.0 RATIO   VLDL 22 0 - 40 mg/dL   LDL Cholesterol 79 0 - 99 mg/dL    Comment:        Total Cholesterol/HDL:CHD Risk Coronary Heart Disease Risk Table                     Men   Women  1/2 Average Risk   3.4   3.3  Average Risk       5.0   4.4  2 X Average Risk   9.6   7.1  3 X Average Risk  23.4   11.0        Use the calculated Patient Ratio above and the CHD Risk Table to determine the patient's CHD Risk.        ATP III CLASSIFICATION (LDL):  <100     mg/dL   Optimal  161-096  mg/dL   Near or Above                    Optimal  130-159  mg/dL   Borderline  045-409  mg/dL   High  >811     mg/dL   Very High Performed at [facility], [address], [place], [place] [address]     Blood Alcohol level:  Lab Results  Component Value Date   ETH 172 (H) 08/21/2023   ETH  156 (H) 06/27/2022    Metabolic Labs: Lab Results  Component Value Date   HGBA1C 4.9 08/23/2023   MPG 93.93 08/23/2023   MPG 103 06/27/2022   No results found for: "PROLACTIN" Lab Results  Component Value Date   CHOL 152 08/23/2023   TRIG 109 08/23/2023   HDL 51 08/23/2023   CHOLHDL 3.0 08/23/2023   VLDL 22 08/23/2023   LDLCALC 79 08/23/2023   LDLCALC 84 06/27/2022    Sleep:No data recorded   Physical Findings: AIMS: No  CIWA:  CIWA-Ar Total: 0 COWS:     Psychiatric Specialty Exam:  Presentation  General Appearance: Casual  Eye Contact:Fair  Speech:Clear and Coherent  Speech Volume:Normal  Handedness:Right   Mood and Affect  Mood: "good"  Affect:Anxious, Congruent   Thought Process  Thought Processes:Coherent  Descriptions of Associations:Intact  Orientation:Full (Time, Place and Person)  Thought Content:Perseveration  History of Schizophrenia/Schizoaffective disorder: None Duration of Psychotic Symptoms:None  Hallucinations: None  Ideas of Reference:None  Suicidal Thoughts: None  Homicidal Thoughts: None   Sensorium  Memory:Immediate Fair  Judgment: Fair, Improving   Insight:Shallow   Executive Functions  Concentration:Fair  Attention Span:Fair  Recall:Fair  Fund of Knowledge:Fair  Language:Fair   Psychomotor Activity  Psychomotor Activity: Normal   Assets  Assets:Communication Skills; Desire for Improvement; Financial Resources/Insurance; Housing   Sleep  Sleep: 7 hours   Physical Exam ROS  Physical Exam Constitutional:      Appearance: the patient is not toxic-appearing.  Pulmonary:     Effort: Pulmonary effort is normal.  Neurological:     General: No focal deficit present.     Mental Status: the patient is alert and oriented to person, place, and time.   Review of Systems  Respiratory:  Negative for shortness of breath.   Cardiovascular:  Negative for chest pain.  Gastrointestinal:  Negative for  abdominal pain, constipation, nausea and vomiting. Negative for diarrhea Neurological:  Negative for headaches.  MSK: negative for myalgais.  Blood pressure 121/83, pulse 95, temperature 98.4 F (36.9 C), temperature source Oral, resp. rate 16, height 5\' 2"  (1.575 m), weight 62.6 kg, SpO2 99%. Body mass index is 25.24 kg/m.  Treatment Plan Summary: Daily contact with patient to assess and evaluate symptoms and progress in treatment, Medication management, and Plan    ASSESSMENT: [name] is a 43 y.o. female  with a past psychiatric history of borderline personality disorder, benzodiazepine dependence, alcohol use disorder, major depressive disorder. Patient initially arrived to [facility] on 5/13 under  involuntary commitment after they received a message from her friend that she had texted plans to jump off a bridge, and admitted to [facility] under IVC on May 13 for acute safety concerns, crisis stabalization, and acute suicidal or self-harming behaviors. PPHx is significant for history of multiple suicide Attempts and multiple prior Psychiatric Hospitalizations. PMHx is significant for none.   Patient appears to have decreased withdrawal symptoms and is also reporting subjective improvement. Is reporting need to abstain from illicit xanax  at time of discharge. Continues to have anxiety symptoms, discussed will increase zoloft  for this.   Diagnoses / Active Problems: Borderline personality disorder MDD, recurrent, severe Generalized anxiety disorder Alcohol use disorder Sedative, hypnotic, or anxiolytic use disorder Cannabis use disorder Tobacco use disorder  PLAN: Safety and Monitoring:  --  INVOLUNTARY admission to inpatient psychiatric unit for safety, stabilization and treatment  -- Daily contact with patient to assess and evaluate symptoms and progress in treatment  -- Patient's case to be discussed in multi-disciplinary team meeting  -- Observation Level : q15 minute checks  --  Vital signs:  q12 hours  -- Precautions: suicide, elopement, and assault  2. Psychiatric Diagnoses and Treatment:   --Increase zoloft  100mg  for depression and anxiety                     --Continue ativan  taper for ongoing BZD use             --continue CIWA with PRN ativan  for BZD withdrawal             --continue to encourage cessation from substances  --Continue hydroxyzine  25mg  at bedtime x1 PRN for insomnia  -- The risks/benefits/side-effects/alternatives to this medication were discussed in detail with the patient and time was given for questions. The patient consents to medication trial.              -- Metabolic profile and EKG monitoring obtained while on an atypical antipsychotic  BMI: Body mass index is 25.24 kg/m. TSH: pending Lipid Panel: pending HbgA1c:  pending  QTc: 419 (May 13)              -- Encouraged patient to participate in unit milieu and in scheduled group therapies   -- Short Term Goals: Ability to identify changes in lifestyle to reduce recurrence of condition will improve, Ability to verbalize feelings will improve, Ability to disclose and discuss suicidal ideas, Ability to demonstrate self-control will improve, Ability to identify and develop effective coping behaviors will improve, Ability to maintain clinical measurements within normal limits will improve, Compliance with prescribed medications will improve, and Ability to identify triggers associated with substance abuse/mental health issues will improve   -- Long Term Goals: Improvement in symptoms so as ready for discharge   Other PRNS:  acetaminophen , alum & mag hydroxide-simeth, hydrOXYzine , loperamide , LORazepam , magnesium  hydroxide, OLANZapine , OLANZapine , OLANZapine  zydis, ondansetron , traZODone   -- As needed agitation protocol in-place   3. Medical Issues Being Addressed:   #Tobacco Use Disorder  Nicotine  patch 21mg /24 hours ordered  Smoking cessation encouraged  4. Discharge Planning:   --  Social work and case management to assist with discharge planning and identification of hospital follow-up needs prior to discharge  -- Estimated LOS: 5/19  -- Discharge Concerns: Need to establish a safety plan; Medication compliance and effectiveness  -- Discharge Goals: Return home with outpatient referrals for mental health follow-up including medication management/psychotherapy   I certify that inpatient services furnished can reasonably be expected to  improve the patient's condition.   This note was created using a voice recognition software as a result there may be grammatical errors inadvertently enclosed that do not reflect the nature of this encounter. Every attempt is made to correct such errors.   This case was discussed with attending Dr. Linnie Riches who agrees with the above formulated treatment plan. Please see attending attestation for additional details.   Dr. Norbert Bean, MD PGY-2, Psychiatry Residency  5/16/20256:38 AM

## 2023-08-25 DIAGNOSIS — F332 Major depressive disorder, recurrent severe without psychotic features: Secondary | ICD-10-CM | POA: Diagnosis not present

## 2023-08-25 MED ORDER — SERTRALINE HCL 50 MG PO TABS: 50.0000 mg | ORAL_TABLET | Freq: Every day | ORAL | Status: DC

## 2023-08-25 MED ORDER — SERTRALINE HCL 50 MG PO TABS
50.0000 mg | ORAL_TABLET | Freq: Every day | ORAL | Status: DC
  Administered 2023-08-25 – 2023-08-27 (×3): 50 mg via ORAL
  Filled 2023-08-25 (×6): qty 1

## 2023-08-25 NOTE — Group Note (Signed)
 Date:  08/25/2023 Time:  11:15 AM  Group Topic/Focus:  Emotional Education:   The focus of this group is to discuss what feelings/emotions are, and how they are experienced.    Participation Level:  Active  Avonne Berkery J Lorry Furber 08/25/2023, 11:15 AM

## 2023-08-25 NOTE — Group Note (Signed)
 LCSW Group Therapy Note   Group Date: 08/25/2023 Start Time: 1330 End Time: 1430   Type of Therapy and Topic:  Group Therapy:   Participation Level:  Active  Description of Group:   Therapeutic Goals:  1.    LCSW Group Therapy Note   Type of Therapy and Topic:  Group Therapy - Safety  Participation Level:  Active   Description of Group This process group involved patients discussing the situations or people in their lives that frequently make them safe or unsafe.  Anxiety was a common factor among all group participants and many of them described home situations that keep them on edge and not able to feel completely safe.  Three questions were addressed during the group:  (1) What makes you feel safe (or unsafe)?  (2) Do you feel safe with yourself and why?  (3) If you don't feel safe, what can you do?  A lengthy discussion ensued in which group members empathized with each other, gave suggestions to one another, and expressed their feelings freely.  Therapeutic Goals Patient will describe what makes them feel safe or unsafe in their everyday lives. Patient will think about and discuss whether they feel safe with themselves and what reasons might contribute to feeling safe or unsafe. Patients will participate in planning for what can be done to help themselves feel safer.   Summary of Patient Progress:  The patient share that her self worth is to live for her family. The patient stated that she have a hard time trusting people due to how they have treated her. The patient share that she learned to trust herself. The patient share that what her keep her safe keep toxic out, my mother's kitchen, my sister's house, my brother's house, my porch with catd, my garden, enjoy running/ walking, enjoy a good restaurant, and my bed in my room.    Therapeutic Modalities Cognitive Behavioral Therapy   Therapeutic Modalities:   Odie Benne, LCSWA 08/25/2023  3:46 PM

## 2023-08-25 NOTE — Plan of Care (Signed)
  Problem: Education: Goal: Knowledge of [place] [facility] Education information/materials will improve Outcome: Completed/Met Goal: Emotional status will improve Outcome: Progressing Goal: Mental status will improve Outcome: Progressing Goal: Verbalization of understanding the information provided will improve Outcome: Progressing   Problem: Activity: Goal: Sleeping patterns will improve Outcome: Progressing

## 2023-08-25 NOTE — Group Note (Deleted)
 Date:  08/25/2023 Time:  10:43 AM  Group Topic/Focus:  Goals Group:   The focus of this group is to help patients establish daily goals to achieve during treatment and discuss how the patient can incorporate goal setting into their daily lives to aide in recovery. Orientation:   The focus of this group is to educate the patient on the purpose and policies of crisis stabilization and provide a format to answer questions about their admission.  The group details unit policies and expectations of patients while admitted.     Participation Level:  {BHH PARTICIPATION ZOXWR:60454}  Participation Quality:  {BHH PARTICIPATION QUALITY:22265}  Affect:  {BHH AFFECT:22266}  Cognitive:  {BHH COGNITIVE:22267}  Insight: {BHH Insight2:20797}  Engagement in Group:  {BHH ENGAGEMENT IN UJWJX:91478}  Modes of Intervention:  {BHH MODES OF INTERVENTION:22269}  Additional Comments:  ***  Park Bolk Aven Christen 08/25/2023, 10:43 AM

## 2023-08-25 NOTE — Progress Notes (Signed)
   08/25/23 0800  Psych Admission Type (Psych Patients Only)  Admission Status Involuntary  Psychosocial Assessment  Patient Complaints Anxiety;Substance abuse  Eye Contact Fair  Facial Expression Animated  Affect Appropriate to circumstance  Speech Logical/coherent  Interaction Assertive  Motor Activity Slow  Appearance/Hygiene Unremarkable  Behavior Characteristics Appropriate to situation  Mood Depressed;Anxious  Thought Process  Coherency WDL  Content WDL  Delusions None reported or observed  Perception WDL  Hallucination None reported or observed  Judgment Poor  Confusion None  Danger to Self  Current suicidal ideation? Denies  Agreement Not to Harm Self Yes  Description of Agreement verbal  Danger to Others  Danger to Others None reported or observed

## 2023-08-25 NOTE — Progress Notes (Signed)
   08/24/23 2256  Psych Admission Type (Psych Patients Only)  Admission Status Involuntary  Psychosocial Assessment  Patient Complaints Anxiety;Depression;Substance abuse  Eye Contact Fair  Facial Expression Animated  Affect Appropriate to circumstance  Speech Logical/coherent  Interaction Assertive  Motor Activity Slow  Appearance/Hygiene Unremarkable  Behavior Characteristics Appropriate to situation  Mood Depressed;Anxious  Thought Process  Coherency WDL  Content WDL  Delusions None reported or observed  Hallucination None reported or observed  Judgment Impaired  Confusion None  Danger to Self  Current suicidal ideation? Denies  Agreement Not to Harm Self Yes  Description of Agreement verbal  Danger to Others  Danger to Others None reported or observed

## 2023-08-25 NOTE — Group Note (Signed)
 Date:  08/25/2023 Time:  10:38 AM  Group Topic/Focus:  Goals Group:   The focus of this group is to help patients establish daily goals to achieve during treatment and discuss how the patient can incorporate goal setting into their daily lives to aide in recovery. Orientation:   The focus of this group is to educate the patient on the purpose and policies of crisis stabilization and provide a format to answer questions about their admission.  The group details unit policies and expectations of patients while admitted.    Participation Level:  Active  Eleni Frank J Raman Featherston 08/25/2023, 10:38 AM

## 2023-08-25 NOTE — Progress Notes (Signed)
 [facility] MD Progress Note  08/25/2023 10:33 AM [name]  MRN:  [id number]  Principal Problem: <principal problem not specified> Diagnosis: Active Problems:   Substance induced mood disorder (HCC)  Reason for Admission:  [name] is a 43 y.o. female  with a past psychiatric history of borderline personality disorder, benzodiazepine dependence, alcohol use disorder, major depressive disorder. Patient initially arrived to [facility] on 5/13 under involuntary commitment after they received a message from her friend that she had texted plans to jump off a bridge, and admitted to [facility] under IVC on May 13 for acute safety concerns, crisis stabalization, and acute suicidal or self-harming behaviors. PPHx is significant for history of multiple suicide Attempts and multiple prior Psychiatric Hospitalizations. PMHx is significant for none.   (admitted on 08/21/2023, total  LOS: 4 days )  Chart review: Overnight Events Vital signs: stable  MAR was reviewed and patient was compliant with medications.  Patient received no PRN CIWA: 0 Labs: lipid panel wnl, A1c wnl Sleep: 7 hours Nursing notes /groups: attended 3/3 groups  Pertinent information discussed during bed progression:  Case was discussed in the multidisciplinary team. Nursing report patient slept 7 hours and denied SI/HI/AVH. Reports CIWA 0.   Information Obtained Today During Patient Interview: Patient was evaluated in her room, she presents calm and pleasant reporting slept well last night, had a good day yesterday, reports improved in general including depressed mood and withdrawals she does not present in any withdrawals and denies any current craving, she scales her current depressed mood at 4 out of 1010 being the worst and denies any passive or active SI intention or plan, denies HI or AVH, reports side effect of Zoloft  after increased dose "having funny head I would like it to be decreased to back down to 50 mg" otherwise she  denies any other side effect of medications and agrees to comply.  I was able to discuss with her triggers of substance use including alcohol and benzodiazepine, and she was able to discuss coping skills with the stressors and triggers, she continues to agree with IOP referral after discharge.  Past Psychiatric Hx: She reports prior diagnoses of PTSD, bipolar, MDD, GAD, borderline personality disorder.  She reports that she "does not believe any of that crap."  She reports that she stopped medications around a year ago because she felt like it was not working.  She also reports that she had difficulty paying for the medications.  She reports she was previously seeing the psychiatrist at [facility] but she stopped a year ago.  She reports her last suicide attempt was in 2018 and she also had a couple of suicide attempts prior to that.  Per chart review, she had prior hospitalizations at [facility] in 2008 x 2, 2010, 2011 x 3, 2012 x 3, 2014 x 1, 2022 x 2 and most recently in March 2024 at [facility]  She was last seen at [facility] outpatient in April 2024. She reports that she used to cut herself a long time ago but it was in the early 20s.  She does confirm that the last medications that she had were the BuSpar , gabapentin , prazosin , Zoloft  and trazodone  which are prescribed from the behavioral health urgent care and from her last admission at [facility].   Substance Abuse Hx: (Frequency, quantity, last use, impact) She reports that she has been using Xanax  for over 8 years for anxiety nonprescribed.  She reports that she has been using 2 mg to 4 mg on  a bad day.  She reports that she had a period where she detoxed after she was in jail and stopped taking the meds for over a year.  However she reports that she just started again.  She denies any history of seizures from benzo withdrawal.  She reports that she has gone to rehab for alcohol and drug use she reports that she went once in 2016 and it was helpful and went to an [facility]  afterwards.  However she reported that she is then relapsed in 2017.  She also reports alcohol use.  She reports that she has been using alcohol more in the past months she reports that she has used it at least twice.  Prior to last month she said she was only using alcohol couple times a year.  She reports that she was using alcohol more recently because she felt like Xanax  was not enough for her anxiety depression and stress.  She also reports cannabis use every day about 1 to 2 g.  She also reports smoking cigarettes 1 to 2 packs/day.  She is interested in the nicotine  patch.   Past Medical History: PCP: None Medical Dx: Asthma, GERD Medications: Albuterol  as needed, Protonix  as needed Allergies: Reports allergy to Haldol , she reports that it makes her neck tight and tense and she feels restless Having periods   Family Medical History:      Family History  Problem Relation Age of Onset   Drug abuse Mother     Anxiety disorder Mother     Anxiety disorder Father     Drug abuse Maternal Aunt     Alcohol abuse Maternal Grandfather     Drug abuse Cousin            Family Psychiatric History: She reports a family history of her mom with bipolar and borderline personality disorder and major depressive disorder as well as cannabis use.  She reports her dad has PTSD and depression.  She reports her sister has anxiety and her brother has had maybe 1 psych hospitalization a long time ago.  She reports her mom had several suicide attempts.   Social History: She reports she lives with her mom and dad and her dog and cat.  She reports that she did not graduate high school, reports that she got suspended prior to graduating her senior year.  She denies any military history.  She denies that she has any children.  She denies that she is not in any current relationship.  She reports the one with her ex was 2 years ago and they separated due to his infidelity.  She is currently working at [employer].    Access to firearms: Reports that there is a gun that is locked up at home Medication stockpile: Denies   Past Medical History:  Past Medical History:  Diagnosis Date   Anxiety    Benzodiazepine abuse (HCC)    Bipolar 1 disorder (HCC)    Depression    Drug abuse (HCC)    ETOH abuse    Polysubstance abuse (HCC)    Family History:  Family History  Problem Relation Age of Onset   Drug abuse Mother    Anxiety disorder Mother    Anxiety disorder Father    Drug abuse Maternal Aunt    Alcohol abuse Maternal Grandfather    Drug abuse Cousin     Current Medications: Current Facility-Administered Medications  Medication Dose Route Frequency Provider Last Rate Last Admin  acetaminophen  (TYLENOL ) tablet 975 mg  975 mg Oral Q6H PRN [name], NP   975 mg at 08/24/23 1619   alum & mag hydroxide-simeth (MAALOX/MYLANTA) 200-200-20 MG/5ML suspension 30 mL  30 mL Oral Q4H PRN [name], NP       hydrOXYzine  (ATARAX ) tablet 25 mg  25 mg Oral QHS,MR X 1 Chien, Stephanie, MD   25 mg at 08/24/23 2117   LORazepam  (ATIVAN ) tablet 1 mg  1 mg Oral Q6H PRN Chien, Stephanie, MD       Cecily Cohen ON 08/26/2023] LORazepam  (ATIVAN ) tablet 1 mg  1 mg Oral Daily Chien, Stephanie, MD       magnesium  hydroxide (MILK OF MAGNESIA) suspension 30 mL  30 mL Oral Daily PRN [name], NP       nicotine  (NICODERM CQ  - dosed in mg/24 hours) patch 21 mg  21 mg Transdermal Daily Chien, Stephanie, MD   21 mg at 08/25/23 0805   OLANZapine  (ZYPREXA ) injection 10 mg  10 mg Intramuscular TID PRN [name], NP       OLANZapine  (ZYPREXA ) injection 5 mg  5 mg Intramuscular TID PRN [name], NP       OLANZapine  zydis (ZYPREXA ) disintegrating tablet 5 mg  5 mg Oral TID PRN [name], NP       sertraline  (ZOLOFT ) tablet 100 mg  100 mg Oral Daily Chien, Stephanie, MD       traZODone  (DESYREL ) tablet 50 mg  50 mg Oral QHS PRN [name], NP        Lab Results:  Results for orders placed  or performed during the hospital encounter of 08/21/23 (from the past 48 hours)  Hemoglobin A1c     Status: None   Collection Time: 08/23/23  6:46 PM  Result Value Ref Range   Hgb A1c MFr Bld 4.9 4.8 - 5.6 %    Comment: (NOTE) Pre diabetes:          5.7%-6.4%  Diabetes:              >6.4%  Glycemic control for   <7.0% adults with diabetes    Mean Plasma Glucose 93.93 mg/dL    Comment: Performed at [facility], [address] [place], [place] [address]  Lipid panel     Status: None   Collection Time: 08/23/23  6:46 PM  Result Value Ref Range   Cholesterol 152 0 - 200 mg/dL   Triglycerides 478 <295 mg/dL   HDL 51 >62 mg/dL   Total CHOL/HDL Ratio 3.0 RATIO   VLDL 22 0 - 40 mg/dL   LDL Cholesterol 79 0 - 99 mg/dL    Comment:        Total Cholesterol/HDL:CHD Risk Coronary Heart Disease Risk Table                     Men   Women  1/2 Average Risk   3.4   3.3  Average Risk       5.0   4.4  2 X Average Risk   9.6   7.1  3 X Average Risk  23.4   11.0        Use the calculated Patient Ratio above and the CHD Risk Table to determine the patient's CHD Risk.        ATP III CLASSIFICATION (LDL):  <100     mg/dL   Optimal  130-865  mg/dL   Near or Above  Optimal  130-159  mg/dL   Borderline  161-096  mg/dL   High  >045     mg/dL   Very High Performed at [facility], [address], [place], [place] [address]     Blood Alcohol level:  Lab Results  Component Value Date   ETH 172 (H) 08/21/2023   ETH 156 (H) 06/27/2022    Metabolic Labs: Lab Results  Component Value Date   HGBA1C 4.9 08/23/2023   MPG 93.93 08/23/2023   MPG 103 06/27/2022   No results found for: "PROLACTIN" Lab Results  Component Value Date   CHOL 152 08/23/2023   TRIG 109 08/23/2023   HDL 51 08/23/2023   CHOLHDL 3.0 08/23/2023   VLDL 22 08/23/2023   LDLCALC 79 08/23/2023   LDLCALC 84 06/27/2022    Sleep:No data recorded   Physical  Findings: AIMS: No  CIWA:  CIWA-Ar Total: 1 COWS:     Psychiatric Specialty Exam:  Presentation  General Appearance: Casual  Eye Contact:Fair  Speech:Clear and Coherent  Speech Volume:Normal  Handedness:Right   Mood and Affect  Mood: "good"  Affect:Anxious, Congruent   Thought Process  Thought Processes:Coherent  Descriptions of Associations:Intact  Orientation:Full (Time, Place and Person)  Thought Content:Perseveration  History of Schizophrenia/Schizoaffective disorder: None Duration of Psychotic Symptoms:None  Hallucinations: None  Ideas of Reference:None  Suicidal Thoughts: None  Homicidal Thoughts: None   Sensorium  Memory:Immediate Fair  Judgment: Fair, Improving   Insight:Shallow   Executive Functions  Concentration:Fair  Attention Span:Fair  Recall:Fair  Fund of Knowledge:Fair  Language:Fair   Psychomotor Activity  Psychomotor Activity: Normal   Assets  Assets:Communication Skills; Desire for Improvement; Financial Resources/Insurance; Housing   Sleep  Sleep: 7 hours   Physical Exam Vitals and nursing note reviewed.    Review of Systems  All other systems reviewed and are negative.   Physical Exam Constitutional:      Appearance: the patient is not toxic-appearing.  Pulmonary:     Effort: Pulmonary effort is normal.  Neurological:     General: No focal deficit present.     Mental Status: the patient is alert and oriented to person, place, and time.   Review of Systems  Respiratory:  Negative for shortness of breath.   Cardiovascular:  Negative for chest pain.  Gastrointestinal:  Negative for abdominal pain, constipation, nausea and vomiting. Negative for diarrhea Neurological:  Negative for headaches.  MSK: negative for myalgais.  Blood pressure (!) 149/86, pulse 97, temperature 98.6 F (37 C), temperature source Oral, resp. rate 15, height 5\' 2"  (1.575 m), weight 62.6 kg, SpO2 100%. Body mass index is  25.24 kg/m.  Treatment Plan Summary: Daily contact with patient to assess and evaluate symptoms and progress in treatment, Medication management, and Plan    ASSESSMENT: [name] is a 43 y.o. female  with a past psychiatric history of borderline personality disorder, benzodiazepine dependence, alcohol use disorder, major depressive disorder. Patient initially arrived to [facility] on 5/13 under involuntary commitment after they received a message from her friend that she had texted plans to jump off a bridge, and admitted to [facility] under IVC on May 13 for acute safety concerns, crisis stabalization, and acute suicidal or self-harming behaviors. PPHx is significant for history of multiple suicide Attempts and multiple prior Psychiatric Hospitalizations. PMHx is significant for none.   Patient appears to have decreased withdrawal symptoms and is also reporting subjective improvement. Is reporting need to abstain from illicit xanax  at time of  discharge. Continues to have anxiety symptoms, discussed will increase zoloft  for this.   Diagnoses / Active Problems: Borderline personality disorder MDD, recurrent, severe Generalized anxiety disorder Alcohol use disorder Sedative, hypnotic, or anxiolytic use disorder Cannabis use disorder Tobacco use disorder  PLAN: Safety and Monitoring:  --  INVOLUNTARY admission to inpatient psychiatric unit for safety, stabilization and treatment  -- Daily contact with patient to assess and evaluate symptoms and progress in treatment  -- Patient's case to be discussed in multi-disciplinary team meeting  -- Observation Level : q15 minute checks  -- Vital signs:  q12 hours  -- Precautions: suicide, elopement, and assault  2. Psychiatric Diagnoses and Treatment:   -- Decrease Zoloft  back to 50 mg for depression and anxiety per patient's request and given reported side effect as noted above.  With recommendation to attempt again to titrate on outpatient basis  after discharge.                   --Continue ativan  taper for ongoing BZD use, will end completely on Sunday 5/18            --continue CIWA with PRN ativan  for BZD withdrawal             --continue to encourage cessation from substances  --Continue hydroxyzine  25mg  at bedtime x1 PRN for insomnia  -- The risks/benefits/side-effects/alternatives to this medication were discussed in detail with the patient and time was given for questions. The patient consents to medication trial.              -- Metabolic profile and EKG monitoring obtained while on an atypical antipsychotic  BMI: Body mass index is 25.24 kg/m. TSH: pending Lipid Panel: pending HbgA1c:  pending  QTc: 419 (May 13)              -- Encouraged patient to participate in unit milieu and in scheduled group therapies   -- Short Term Goals: Ability to identify changes in lifestyle to reduce recurrence of condition will improve, Ability to verbalize feelings will improve, Ability to disclose and discuss suicidal ideas, Ability to demonstrate self-control will improve, Ability to identify and develop effective coping behaviors will improve, Ability to maintain clinical measurements within normal limits will improve, Compliance with prescribed medications will improve, and Ability to identify triggers associated with substance abuse/mental health issues will improve   -- Long Term Goals: Improvement in symptoms so as ready for discharge   Other PRNS:  acetaminophen , alum & mag hydroxide-simeth, LORazepam , magnesium  hydroxide, OLANZapine , OLANZapine , OLANZapine  zydis, traZODone   -- As needed agitation protocol in-place   3. Medical Issues Being Addressed:   #Tobacco Use Disorder  Nicotine  patch 21mg /24 hours ordered  Smoking cessation encouraged  4. Discharge Planning:   -- Social work and case management to assist with discharge planning and identification of hospital follow-up needs prior to discharge  -- Estimated LOS: 5/19  --  Discharge Concerns: Need to establish a safety plan; Medication compliance and effectiveness  -- Discharge Goals: Return home with outpatient referrals for mental health follow-up including medication management/psychotherapy   I certify that inpatient services furnished can reasonably be expected to improve the patient's condition.   This note was created using a voice recognition software as a result there may be grammatical errors inadvertently enclosed that do not reflect the nature of this encounter. Every attempt is made to correct such errors.   This case was discussed with attending Dr. Linnie Riches who agrees with the above formulated  treatment plan. Please see attending attestation for additional details.   [name] Linnie Riches, MD 5/17/202510:33 AM

## 2023-08-25 NOTE — [facility] Group Notes (Signed)
 [facility] Group Notes:  (Nursing/MHT/Case Management/Adjunct)  Date:  08/25/2023  Time:  2000  Type of Therapy:  Wrap up group  Participation Level:  Active  Participation Quality:  Appropriate, Attentive, Sharing, and Supportive  Affect:  Depressed, Irritable, and Resistant  Cognitive:  Alert  Insight:  Improving  Engagement in Group:  Engaged  Modes of Intervention:  Clarification, Education, and Support  Summary of Progress/Problems: Positive thinking and positive change were discussed.   Catharine Clock 08/25/2023, 9:33 PM

## 2023-08-26 DIAGNOSIS — F332 Major depressive disorder, recurrent severe without psychotic features: Secondary | ICD-10-CM | POA: Diagnosis not present

## 2023-08-26 MED ORDER — HYDROXYZINE HCL 25 MG PO TABS
25.0000 mg | ORAL_TABLET | Freq: Four times a day (QID) | ORAL | Status: DC | PRN
  Filled 2023-08-26: qty 10

## 2023-08-26 NOTE — Progress Notes (Signed)
 [facility] MD Progress Note  08/26/2023 9:38 AM [name]  MRN:  [id number]  Principal Problem: <principal problem not specified> Diagnosis: Active Problems:   Substance induced mood disorder (HCC)  Reason for Admission:  [name] is a 43 y.o. female  with a past psychiatric history of borderline personality disorder, benzodiazepine dependence, alcohol use disorder, major depressive disorder. Patient initially arrived to [facility] on 5/13 under involuntary commitment after they received a message from her friend that she had texted plans to jump off a bridge, and admitted to [facility] under IVC on May 13 for acute safety concerns, crisis stabalization, and acute suicidal or self-harming behaviors. PPHx is significant for history of multiple suicide Attempts and multiple prior Psychiatric Hospitalizations. PMHx is significant for none.   (admitted on 08/21/2023, total  LOS: 5 days )  Per chart review patient is compliant with her medications except refusing Ativan  taper dose last night.  After that she had CIWA elevated at 11 but prior to that CIWA score was at 1 and 0, when reviewed it is mostly reflecting anxiety and irritability, with paroxysmal sweats score at 3.  No as needed medication for agitation needed or given, no as needed trazodone  for sleep needed or given.   Information Obtained Today During Patient Interview: Patient was evaluated on the unit this morning.  She tells me she refused her Ativan  also this morning and reports anxiety "up to the roof" she presents anxious with some tremors noted but no paroxysmal sweating or irritability or agitation noted.  She was recommended to comply with Ativan  taper and after our interview she received her scheduled dosing.  She reports improved depression since admission and continues to deny passive or active SI intention or plan, she denies HI or AVH.  She denies craving to alcohol or benzodiazepine "I hate it I am over it" she presents  genuinely interested in continuing abstaining after discharge with interest in IOP compliance after discharge.  She denies side effect to current medication regimen and agrees to comply after discharge. Discussed with patient plan to discharge tomorrow morning if continues to improve.  Past Psychiatric Hx: She reports prior diagnoses of PTSD, bipolar, MDD, GAD, borderline personality disorder.  She reports that she "does not believe any of that crap."  She reports that she stopped medications around a year ago because she felt like it was not working.  She also reports that she had difficulty paying for the medications.  She reports she was previously seeing the psychiatrist at [facility] but she stopped a year ago.  She reports her last suicide attempt was in 2018 and she also had a couple of suicide attempts prior to that.  Per chart review, she had prior hospitalizations at [facility] in 2008 x 2, 2010, 2011 x 3, 2012 x 3, 2014 x 1, 2022 x 2 and most recently in March 2024 at [facility].  She was last seen at [facility] outpatient in April 2024. She reports that she used to cut herself a long time ago but it was in the early 20s.  She does confirm that the last medications that she had were the BuSpar , gabapentin , prazosin , Zoloft  and trazodone  which are prescribed from the behavioral health urgent care and from her last admission at [facility].   Substance Abuse Hx: (Frequency, quantity, last use, impact) She reports that she has been using Xanax  for over 8 years for anxiety nonprescribed.  She reports that she has been using 2 mg to 4 mg on  a bad day.  She reports that she had a period where she detoxed after she was in jail and stopped taking the meds for over a year.  However she reports that she just started again.  She denies any history of seizures from benzo withdrawal.  She reports that she has gone to rehab for alcohol and drug use she reports that she went once in 2016 and it was helpful and went to an [facility]  afterwards.  However she reported that she is then relapsed in 2017.  She also reports alcohol use.  She reports that she has been using alcohol more in the past months she reports that she has used it at least twice.  Prior to last month she said she was only using alcohol couple times a year.  She reports that she was using alcohol more recently because she felt like Xanax  was not enough for her anxiety depression and stress.  She also reports cannabis use every day about 1 to 2 g.  She also reports smoking cigarettes 1 to 2 packs/day.  She is interested in the nicotine  patch.   Past Medical History: PCP: None Medical Dx: Asthma, GERD Medications: Albuterol  as needed, Protonix  as needed Allergies: Reports allergy to Haldol , she reports that it makes her neck tight and tense and she feels restless Having periods   Family Medical History:      Family History  Problem Relation Age of Onset   Drug abuse Mother     Anxiety disorder Mother     Anxiety disorder Father     Drug abuse Maternal Aunt     Alcohol abuse Maternal Grandfather     Drug abuse Cousin            Family Psychiatric History: She reports a family history of her mom with bipolar and borderline personality disorder and major depressive disorder as well as cannabis use.  She reports her dad has PTSD and depression.  She reports her sister has anxiety and her brother has had maybe 1 psych hospitalization a long time ago.  She reports her mom had several suicide attempts.   Social History: She reports she lives with her mom and dad and her dog and cat.  She reports that she did not graduate high school, reports that she got suspended prior to graduating her senior year.  She denies any military history.  She denies that she has any children.  She denies that she is not in any current relationship.  She reports the one with her ex was 2 years ago and they separated due to his infidelity.  She is currently working at [employer].    Access to firearms: Reports that there is a gun that is locked up at home Medication stockpile: Denies   Past Medical History:  Past Medical History:  Diagnosis Date   Anxiety    Benzodiazepine abuse (HCC)    Bipolar 1 disorder (HCC)    Depression    Drug abuse (HCC)    ETOH abuse    Polysubstance abuse (HCC)    Family History:  Family History  Problem Relation Age of Onset   Drug abuse Mother    Anxiety disorder Mother    Anxiety disorder Father    Drug abuse Maternal Aunt    Alcohol abuse Maternal Grandfather    Drug abuse Cousin     Current Medications: Current Facility-Administered Medications  Medication Dose Route Frequency Provider Last Rate Last Admin  acetaminophen  (TYLENOL ) tablet 975 mg  975 mg Oral Q6H PRN [name], NP   975 mg at 08/26/23 0729   alum & mag hydroxide-simeth (MAALOX/MYLANTA) 200-200-20 MG/5ML suspension 30 mL  30 mL Oral Q4H PRN [name], NP       hydrOXYzine  (ATARAX ) tablet 25 mg  25 mg Oral QHS,MR X 1 Chien, Stephanie, MD   25 mg at 08/25/23 2126   magnesium  hydroxide (MILK OF MAGNESIA) suspension 30 mL  30 mL Oral Daily PRN [name], NP       nicotine  (NICODERM CQ  - dosed in mg/24 hours) patch 21 mg  21 mg Transdermal Daily Chien, Stephanie, MD   21 mg at 08/26/23 1610   OLANZapine  (ZYPREXA ) injection 10 mg  10 mg Intramuscular TID PRN [name], NP       OLANZapine  (ZYPREXA ) injection 5 mg  5 mg Intramuscular TID PRN [name], NP       OLANZapine  zydis (ZYPREXA ) disintegrating tablet 5 mg  5 mg Oral TID PRN [name], NP       sertraline  (ZOLOFT ) tablet 50 mg  50 mg Oral Daily Norberta Stobaugh, MD   50 mg at 08/26/23 0801   traZODone  (DESYREL ) tablet 50 mg  50 mg Oral QHS PRN [name], NP        Lab Results:  No results found for this or any previous visit (from the past 48 hours).   Blood Alcohol level:  Lab Results  Component Value Date   ETH 172 (H) 08/21/2023   ETH 156 (H)  06/27/2022    Metabolic Labs: Lab Results  Component Value Date   HGBA1C 4.9 08/23/2023   MPG 93.93 08/23/2023   MPG 103 06/27/2022   No results found for: "PROLACTIN" Lab Results  Component Value Date   CHOL 152 08/23/2023   TRIG 109 08/23/2023   HDL 51 08/23/2023   CHOLHDL 3.0 08/23/2023   VLDL 22 08/23/2023   LDLCALC 79 08/23/2023   LDLCALC 84 06/27/2022    Sleep:No data recorded   Physical Findings: AIMS: No  CIWA:  CIWA-Ar Total: 11 COWS:     Psychiatric Specialty Exam:  Presentation  General Appearance: Casual  Eye Contact:Fair  Speech:Clear and Coherent  Speech Volume:Normal  Handedness:Right   Mood and Affect  Mood: Anxious  Affect:Anxious, Congruent   Thought Process  Thought Processes:Coherent  Descriptions of Associations:Intact  Orientation:Full (Time, Place and Person)  Thought Content:Perseveration  History of Schizophrenia/Schizoaffective disorder: None Duration of Psychotic Symptoms:None  Hallucinations: None  Ideas of Reference:None  Suicidal Thoughts: None Denies passive or active SI Homicidal Thoughts: None Denies HI  Sensorium  Memory:Immediate Fair  Judgment: Fair, Improving   Insight: Improved, fair   Executive Functions  Concentration:Fair  Attention Span:Fair  Recall:Fair  [employer] of Knowledge:Fair  Language:Fair   Psychomotor Activity  Psychomotor Activity: Normal   Assets  Assets:Communication Skills; Desire for Improvement; Financial Resources/Insurance; Housing   Sleep  Sleep: 7 hours   Physical Exam Vitals and nursing note reviewed.    Review of Systems  All other systems reviewed and are negative.   Physical Exam Constitutional:      Appearance: the patient is not toxic-appearing.  Pulmonary:     Effort: Pulmonary effort is normal.  Neurological:     General: No focal deficit present.     Mental Status: the patient is alert and oriented to person, place, and time.    Review of Systems  Respiratory:  Negative for shortness of breath.  Cardiovascular:  Negative for chest pain.  Gastrointestinal:  Negative for abdominal pain, constipation, nausea and vomiting. Negative for diarrhea Neurological:  Negative for headaches.  MSK: negative for myalgais.  Blood pressure (!) 143/84, pulse 83, temperature 98.6 F (37 C), temperature source Oral, resp. rate 20, height 5\' 2"  (1.575 m), weight 62.6 kg, SpO2 99%. Body mass index is 25.24 kg/m.  Treatment Plan Summary: Daily contact with patient to assess and evaluate symptoms and progress in treatment, Medication management, and Plan    ASSESSMENT: Talyia Allende is a 43 y.o. female  with a past psychiatric history of borderline personality disorder, benzodiazepine dependence, alcohol use disorder, major depressive disorder. Patient initially arrived to [facility] on 5/13 under involuntary commitment after they received a message from her friend that she had texted plans to jump off a bridge, and admitted to [facility] under IVC on May 13 for acute safety concerns, crisis stabalization, and acute suicidal or self-harming behaviors. PPHx is significant for history of multiple suicide Attempts and multiple prior Psychiatric Hospitalizations. PMHx is significant for none.   Patient appears to have decreased withdrawal symptoms and is also reporting subjective improvement. Is reporting need to abstain from illicit xanax  at time of discharge. Continues to have anxiety symptoms, discussed will increase zoloft  for this.   Diagnoses / Active Problems: Borderline personality disorder MDD, recurrent, severe Generalized anxiety disorder Alcohol use disorder Sedative, hypnotic, or anxiolytic use disorder Cannabis use disorder Tobacco use disorder  PLAN: Safety and Monitoring:  --  INVOLUNTARY admission to inpatient psychiatric unit for safety, stabilization and treatment  -- Daily contact with patient to assess and evaluate  symptoms and progress in treatment  -- Patient's case to be discussed in multi-disciplinary team meeting  -- Observation Level : q15 minute checks  -- Vital signs:  q12 hours  -- Precautions: suicide, elopement, and assault  2. Psychiatric Diagnoses and Treatment:   -- Decrease Zoloft  back to 50 mg for depression and anxiety per patient's request and given reported side effect as noted above.  With recommendation to attempt again to titrate on outpatient basis after discharge.                   --Continue ativan  taper for ongoing BZD use, will end completely on Sunday 5/18            --continue CIWA with PRN ativan  for BZD withdrawal             --continue to encourage cessation from substances  --Continue hydroxyzine  25mg  at bedtime x1 PRN for insomnia  Added hydroxyzine  25 mg every 6 hours as needed for anxiety  -- The risks/benefits/side-effects/alternatives to this medication were discussed in detail with the patient and time was given for questions. The patient consents to medication trial.              -- Metabolic profile and EKG monitoring obtained while on an atypical antipsychotic  BMI: Body mass index is 25.24 kg/m. TSH: pending Lipid Panel: pending HbgA1c:  pending  QTc: 419 (May 13)              -- Encouraged patient to participate in unit milieu and in scheduled group therapies   -- Short Term Goals: Ability to identify changes in lifestyle to reduce recurrence of condition will improve, Ability to verbalize feelings will improve, Ability to disclose and discuss suicidal ideas, Ability to demonstrate self-control will improve, Ability to identify and develop effective coping behaviors will improve, Ability to  maintain clinical measurements within normal limits will improve, Compliance with prescribed medications will improve, and Ability to identify triggers associated with substance abuse/mental health issues will improve   -- Long Term Goals: Improvement in symptoms so as  ready for discharge   Other PRNS:  acetaminophen , alum & mag hydroxide-simeth, magnesium  hydroxide, OLANZapine , OLANZapine , OLANZapine  zydis, traZODone   -- As needed agitation protocol in-place   3. Medical Issues Being Addressed:   #Tobacco Use Disorder  Nicotine  patch 21mg /24 hours ordered  Smoking cessation encouraged  4. Discharge Planning:   -- Social work and case management to assist with discharge planning and identification of hospital follow-up needs prior to discharge  -- Estimated LOS: 5/19  -- Discharge Concerns: Need to establish a safety plan; Medication compliance and effectiveness  -- Discharge Goals: Return home with outpatient referrals for mental health follow-up including medication management/psychotherapy   I certify that inpatient services furnished can reasonably be expected to improve the patient's condition.   This note was created using a voice recognition software as a result there may be grammatical errors inadvertently enclosed that do not reflect the nature of this encounter. Every attempt is made to correct such errors.   This case was discussed with attending Dr. Linnie Riches who agrees with the above formulated treatment plan. Please see attending attestation for additional details.   Collyn Selk Linnie Riches, MD 5/18/20259:38 AM

## 2023-08-26 NOTE — Plan of Care (Signed)
  Problem: Education: Goal: Mental status will improve Outcome: Progressing   Problem: Health Behavior/Discharge Planning: Goal: Identification of resources available to assist in meeting health care needs will improve Outcome: Progressing Goal: Compliance with treatment plan for underlying cause of condition will improve Outcome: Progressing   Problem: Safety: Goal: Periods of time without injury will increase Outcome: Progressing

## 2023-08-26 NOTE — Progress Notes (Signed)
   08/26/23 0730  Psych Admission Type (Psych Patients Only)  Admission Status Involuntary  Psychosocial Assessment  Patient Complaints Anxiety  Eye Contact Fair  Facial Expression Animated  Affect Appropriate to circumstance  Speech Logical/coherent  Interaction Assertive  Motor Activity Slow  Appearance/Hygiene Unremarkable  Behavior Characteristics Appropriate to situation  Mood Depressed;Anxious  Thought Process  Coherency WDL  Content WDL  Delusions None reported or observed  Perception WDL  Hallucination None reported or observed  Judgment Poor  Confusion None  Danger to Self  Current suicidal ideation? Denies  Agreement Not to Harm Self Yes  Description of Agreement verbal  Danger to Others  Danger to Others None reported or observed

## 2023-08-26 NOTE — Plan of Care (Signed)
   Problem: Education: Goal: Mental status will improve Outcome: Progressing   Problem: Activity: Goal: Interest or engagement in activities will improve Outcome: Progressing

## 2023-08-26 NOTE — Progress Notes (Signed)
   08/25/23 2030  Psych Admission Type (Psych Patients Only)  Admission Status Involuntary  Psychosocial Assessment  Patient Complaints Anxiety;Substance abuse  Eye Contact Fair  Facial Expression Animated  Affect Appropriate to circumstance  Speech Logical/coherent  Interaction Assertive  Motor Activity Slow  Appearance/Hygiene Unremarkable  Behavior Characteristics Appropriate to situation  Mood Depressed;Anxious  Thought Process  Coherency WDL  Content WDL  Delusions None reported or observed  Perception WDL  Hallucination None reported or observed  Judgment Poor  Confusion None  Danger to Self  Current suicidal ideation? Denies  Agreement Not to Harm Self Yes  Description of Agreement Verbal Contract  Danger to Others  Danger to Others None reported or observed

## 2023-08-26 NOTE — Group Note (Signed)
 Date:  08/26/2023 Time:  9:18 AM  Group Topic/Focus:  Goals Group:   The focus of this group is to help patients establish daily goals to achieve during treatment and discuss how the patient can incorporate goal setting into their daily lives to aide in recovery. Orientation:   The focus of this group is to educate the patient on the purpose and policies of crisis stabilization and provide a format to answer questions about their admission.  The group details unit policies and expectations of patients while admitted.    Participation Level:  Active  Participation Quality:  Appropriate  Affect:  Appropriate  Cognitive:  Appropriate  Insight: Appropriate  Engagement in Group:  Engaged  Modes of Intervention:  Orientation  Additional Comments:  goal is to attend all groups  Violette Grief 08/26/2023, 9:18 AM

## 2023-08-26 NOTE — Group Note (Signed)
 Date:  08/27/2023 Time:  12:38 AM  Group Topic/Focus:  Wrap-Up Group:   The focus of this group is to help patients review their daily goal of treatment and discuss progress on daily workbooks.    Participation Level:  Active  Participation Quality:  Appropriate  Affect:  Appropriate  Cognitive:  Appropriate  Insight: Appropriate  Engagement in Group:  Engaged  Modes of Intervention:  Discussion  Additional Comments:  Patient stated she had a great day.  She is very excited about going home tomorrow   Moishe Angel 08/27/2023, 12:38 AM

## 2023-08-27 ENCOUNTER — Encounter (HOSPITAL_COMMUNITY): Payer: Self-pay

## 2023-08-27 DIAGNOSIS — F17200 Nicotine dependence, unspecified, uncomplicated: Secondary | ICD-10-CM | POA: Insufficient documentation

## 2023-08-27 DIAGNOSIS — F1290 Cannabis use, unspecified, uncomplicated: Secondary | ICD-10-CM | POA: Insufficient documentation

## 2023-08-27 MED ORDER — NICOTINE 21 MG/24HR TD PT24: 21.0000 mg | MEDICATED_PATCH | Freq: Every day | TRANSDERMAL | 0 refills | Status: AC

## 2023-08-27 MED ORDER — HYDROXYZINE HCL 25 MG PO TABS: 25.0000 mg | ORAL_TABLET | Freq: Every evening | ORAL | 0 refills | Status: AC | PRN

## 2023-08-27 MED ORDER — HYDROXYZINE HCL 25 MG PO TABS
25.0000 mg | ORAL_TABLET | Freq: Every evening | ORAL | Status: DC | PRN
  Filled 2023-08-27: qty 7

## 2023-08-27 MED ORDER — SERTRALINE HCL 50 MG PO TABS: 50.0000 mg | ORAL_TABLET | Freq: Every day | ORAL | 0 refills | Status: AC

## 2023-08-27 NOTE — Progress Notes (Signed)
 [facility] Adult Case Management Discharge Plan :  Will you be returning to the same living situation after discharge:  Yes,  home w/ mother and father. At discharge, do you have transportation home?: Yes,  father to provide  Do you have the ability to pay for your medications: No.  Release of information consent forms completed and in the chart;  Patient's signature needed at discharge.  Patient to Follow up at:  Follow-up Information     [place] [facility]. Go on 08/30/2023.   Specialty: Behavioral Health Why: Please go to this provider on 08/30/23 at 7:00 am for an assessment, to obtain medication management services. You may also go Monday through Friday, arrive by 7:00 am. Contact information: [address] [place]  [address] [phone]        [place]. Go on 08/31/2023.   Specialty: Professional Counselor Why: Please go to this provider on 08/31/23 at 9:00 am for an assessment, to obtain therapy services. You may also go Monday through Friday, between 9 am and 1 pm. Contact information: [address]  [address] [place] [address] [phone]         [facility] Go on 08/29/2023.   Specialty: Behavioral Health Why: You have an appointment on 08/29/23 at 9:00 am.  This program meets in person M, W, F from 9 am to 12 pm and runs for 8-12 weeks.  Clients can also receive individual and family therapy and MAT.  There is weekly drug testing.  The program is abstinence-based and AA, NA, Smart Recovery, etc. attendance is encouraged.  For questions, please call [phone]. Contact information: [address] [place]  [address] [phone]                Next level of care provider has access to [facility] Link:no  Safety Planning and Suicide Prevention discussed: Yes,  completed w/ patient  Has patient been referred to the Quitline?: Patient refused referral for  treatment  Patient has been referred for addiction treatment: Yes, the patient will follow up with an outpatient provider for substance use disorder. Psychiatrist/APP: appointment made [facility]  Richardson Chang [place], [place] 08/27/2023, 9:19 AM

## 2023-08-27 NOTE — Plan of Care (Signed)
 Called dad Zilpha Mcandrew [phone] regarding safety planning. He confirmed that patient does not have access to guns at home or medication stockpiles.

## 2023-08-27 NOTE — BHH Group Notes (Signed)
 Adult Psychoeducational Group Note  Date:  08/27/2023 Time:  10:17 AM  Group Topic/Focus:  Goals Group:   The focus of this group is to help patients establish daily goals to achieve during treatment and discuss how the patient can incorporate goal setting into their daily lives to aide in recovery.  Participation Level:  Did Not Attend  Participation Quality:  na  Affect:  na  Cognitive:  na  Insight: na  Engagement in Group:  na  Modes of Intervention:  na  Additional Comments:  Pt did not attend group.  Avaleen Brownley 08/27/2023, 10:17 AM

## 2023-08-27 NOTE — Progress Notes (Signed)
   08/26/23 2044  Psych Admission Type (Psych Patients Only)  Admission Status Involuntary  Psychosocial Assessment  Patient Complaints Anxiety;Depression  Eye Contact Fair  Facial Expression Anxious  Affect Appropriate to circumstance  Speech Logical/coherent  Interaction Assertive  Motor Activity Slow  Appearance/Hygiene Unremarkable  Behavior Characteristics Appropriate to situation  Mood Depressed;Anxious  Thought Process  Content WDL  Delusions None reported or observed  Perception WDL  Hallucination None reported or observed  Judgment Poor  Confusion None  Danger to Self  Current suicidal ideation? Denies  Agreement Not to Harm Self Yes  Description of Agreement verbal  Danger to Others  Danger to Others None reported or observed

## 2023-08-27 NOTE — [facility] Suicide Risk Assessment (Signed)
 [facility] Discharge Suicide Risk Assessment  Principal Problem: Major depressive disorder, recurrent severe without psychotic features (HCC) Discharge Diagnoses: Principal Problem:   Major depressive disorder, recurrent severe without psychotic features (HCC) Active Problems:   Sedative, hypnotic or anxiolytic use disorder, severe, dependence (HCC)   Alcohol use disorder   Substance induced mood disorder (HCC)   Borderline personality disorder (HCC)   Cannabis use disorder   Tobacco use disorder  Reason for admission: [name] is a 43 y.o. female  with a past psychiatric history of borderline personality disorder, benzodiazepine dependence, alcohol use disorder, major depressive disorder. Patient initially arrived to [facility] on 5/13 under involuntary commitment after they received a message from her friend that she had texted plans to jump off a bridge, and admitted to [facility] under IVC on May 13 for acute safety concerns, crisis stabalization, and acute suicidal or self-harming behaviors. PPHx is significant for history of multiple suicide Attempts and multiple prior Psychiatric Hospitalizations. PMHx is significant for none.   Hospital Course:  During the patient's hospitalization, patient had extensive initial psychiatric evaluation, and follow-up psychiatric evaluations every day.   Psychiatric diagnoses provided upon initial assessment:  Borderline personality disorder MDD, recurrent, severe Generalized anxiety disorder Alcohol use disorder Sedative, hypnotic, or anxiolytic use disorder Cannabis use disorder Tobacco use disorder   Patient's psychiatric medications were adjusted on admission:   --start zoloft  50mg  for depression and anxiety           --start ativan  taper for ongoing BZD use   --start CIWA with PRN ativan  for BZD withdrawal    During the hospitalization, other adjustments were made to the patient's psychiatric medication regimen:  --ativan  taper for benzodiazepine  dependence was completed  --zoloft  was increased to 100mg  and then decreased back to 50mg  due to patient reported side effect of "having funny head" and per request    Patient's care was discussed during the interdisciplinary team meeting every day during the hospitalization.   The patient denied having additional side effects to prescribed psychiatric medication.   Gradually, patient started adjusting to milieu. The patient was evaluated each day by a clinical provider to ascertain response to treatment. Improvement was noted by the patient's report of decreasing symptoms, improved sleep and appetite, affect, medication tolerance, behavior, and participation in unit programming.  Patient was asked each day to complete a self inventory noting mood, mental status, pain, new symptoms, anxiety and concerns.     Symptoms were reported as significantly decreased or resolved completely by discharge.    On day of discharge, the patient reports that their mood is stable. The patient denied having suicidal thoughts for more than 48 hours prior to discharge.  Patient denies having homicidal thoughts.  Patient denies having auditory hallucinations.  Patient denies any visual hallucinations or other symptoms of psychosis. The patient was motivated to continue taking medication with a goal of continued improvement in mental health.    The patient reports their target psychiatric symptoms of depression responded well to the psychiatric medications, and the patient reports overall benefit other psychiatric hospitalization. Supportive psychotherapy was provided to the patient. The patient also participated in regular group therapy while hospitalized. Coping skills, problem solving as well as relaxation therapies were also part of the unit programming.   Labs were reviewed with the patient, and abnormal results were discussed with the patient.   The patient is able to verbalize their individual safety plan to this  provider.   Behavioral Events: none   #  It is recommended to the patient to continue psychiatric medications as prescribed, after discharge from the hospital.     # It is recommended to the patient to follow up with your outpatient psychiatric provider and PCP.   # It was discussed with the patient, the impact of alcohol, drugs, tobacco have been there overall psychiatric and medical wellbeing, and total abstinence from substance use was recommended to the patient.   # Prescriptions provided or sent directly to preferred pharmacy at discharge. Patient agreeable to plan. Given opportunity to ask questions. Appears to feel comfortable with discharge.    # In the event of worsening symptoms, the patient is instructed to call the crisis hotline, 911 and or go to the nearest ED for appropriate evaluation and treatment of symptoms. To follow-up with primary care provider for other medical issues, concerns and or health care needs   # Patient was discharged home with a plan to follow up as noted below.  Sleep  Sleep:No data recorded  Musculoskeletal: Strength & Muscle Tone: within normal limits Gait & Station: normal Patient leans: N/A  Mental Status Exam  General Appearance: appears at stated age, casually dressed and groomed   Behavior: pleasant and cooperative   Psychomotor Activity: no psychomotor agitation or retardation noted   Eye Contact: fair  Speech: normal amount, volume and fluency   Mood: euthymic  Affect: congruent, pleasant and interactive   Thought Process: linear, goal directed, no circumstantial or tangential thought process noted, no racing thoughts or flight of ideas  Descriptions of Associations: intact   Thought Content Hallucinations: denies AH, VH , does not appear responding to stimuli  Delusions: no paranoia, delusions of control, grandeur, ideas of reference, thought broadcasting, and magical thinking  Suicidal Thoughts: denies SI, intention, plan   Homicidal Thoughts: denies HI, intention, plan   Alertness/Orientation: alert and fully oriented   Insight: fair Judgment: fair  Memory: intact   Executive Functions  Concentration: intact  Attention Span: fair  Recall: intact  Fund of Knowledge: fair   Sleep  Sleep: good   Assets  Assets: Manufacturing systems engineer; Desire for Improvement; Financial Resources/Insurance; Housing   Physical Exam ROS Physical Exam Constitutional:      Appearance: the patient is not toxic-appearing.  Pulmonary:     Effort: Pulmonary effort is normal.  Neurological:     General: No focal deficit present.     Mental Status: the patient is alert and oriented to person, place, and time.   Review of Systems  Respiratory:  Negative for shortness of breath.   Cardiovascular:  Negative for chest pain.  Gastrointestinal:  Negative for abdominal pain, constipation, diarrhea, nausea and vomiting.  Neurological:  Negative for headaches.   Blood pressure (!) 137/95, pulse 73, temperature 98.7 F (37.1 C), temperature source Oral, resp. rate 18, height 5\' 2"  (1.575 m), weight 62.6 kg, SpO2 100%. Body mass index is 25.24 kg/m.  Mental Status Per Nursing Assessment::   On Admission:  NA  Demographic Factors:  Caucasian, Gay, lesbian, or bisexual orientation, and Low socioeconomic status  Loss Factors: Loss of significant relationship  Historical Factors: Prior suicide attempts and Impulsivity  Risk Reduction Factors:   Employed, Living with another person, especially a relative, and Positive social support  Continued Clinical Symptoms:  Severe Anxiety and/or Agitation Depression:   Anhedonia Impulsivity Alcohol/Substance Abuse/Dependencies Personality Disorders:   Cluster B Previous Psychiatric Diagnoses and Treatments  Cognitive Features That Contribute To Risk:  Thought constriction (tunnel vision)  Suicide Risk:  Mild: There are no identifiable plans, no associated intent, mild  dysphoria and related symptoms, good self-control (both objective and subjective assessment), few other risk factors, and identifiable protective factors, including available and accessible social support.    Follow-up Information     [facility]. Go on 08/30/2023.   Specialty: Behavioral Health Why: Please go to this provider on 08/30/23 at 7:00 am for an assessment, to obtain medication management services. You may also go Monday through Friday, arrive by 7:00 am. Contact information: [address] [place]  [phone]        [facility]. Go on 08/31/2023.   Specialty: Professional Counselor Why: Please go to this provider on 08/31/23 at 9:00 am for an assessment, to obtain therapy services. You may also go Monday through Friday, between 9 am and 1 pm. Contact information: [address]  [address] [place] [address] [phone]         [facility]. Go on 08/29/2023.   Specialty: Behavioral Health Why: You have an appointment on 08/29/23 at 9:00 am.  This program meets in person M, W, F from 9 am to 12 pm and runs for 8-12 weeks.  Clients can also receive individual and family therapy and MAT.  There is weekly drug testing.  The program is abstinence-based and AA, NA, Smart [facility], etc. attendance is encouraged.  For questions, please call [phone]. Contact information: [address] [place]  [id number] [phone]                Discharge recommendations:   Activity: as tolerated   Diet: heart healthy   # It is recommended to the patient to continue psychiatric medications as prescribed, after discharge from the hospital.     # It is recommended to the patient to follow up with your outpatient psychiatric provider -instructions on appointment date, time, and address (location) are provided to you in discharge paperwork   # Follow-up with outpatient  primary care doctor and other specialists -for management of chronic medical disease, including:  --none   # Testing: Follow-up with outpatient provider for abnormal lab results:  -K 3.4, alkaline phosphatase 37   # It was discussed with the patient, the impact of alcohol, drugs, tobacco have been there overall psychiatric and medical wellbeing, and total abstinence from substance use was recommended to the patient.   # Prescriptions provided or sent directly to preferred pharmacy at discharge. Patient agreeable to plan. Given opportunity to ask questions. Appears to feel comfortable with discharge.    # In the event of worsening symptoms, the patient is instructed to call the crisis hotline, 911 and or go to the nearest ED for appropriate evaluation and treatment of symptoms. To follow-up with primary care provider for other medical issues, concerns and or health care needs  Patient agrees with D/C instructions and plan.   This case was discussed with attending Dr. Linnie Riches who agrees with the above formulated treatment plan. Please see attending attestation for additional details.   This note was created using a voice recognition software as a result there may be grammatical errors inadvertently enclosed that do not reflect the nature of this encounter. Every attempt is made to correct such errors.   Norbert Bean, MD, PGY-2 08/27/2023, 6:49 AM

## 2023-08-27 NOTE — Discharge Instructions (Signed)

## 2023-08-27 NOTE — Progress Notes (Signed)
 Patient verbalizes readiness for discharge. All patient belongings returned to patient. Discharge instructions read and discussed with patient (appointments, medications, resources). Patient irritable. Patient refused to fill out the survey. Patient discharged to lobby at 1045.

## 2023-08-27 NOTE — [facility] Suicide Risk Assessment (Signed)
 [facility] INPATIENT:  Patient Suicide Prevention Education  Suicide Prevention Education:  Education Completed;   Suicide Prevention Education was reviewed thoroughly with patient, including risk factors, warning signs, and what to do. Mobile Crisis services were described and that telephone number pointed out, with encouragement to patient to put this number in personal cell phone. Brochure was provided to patient to share with natural supports. Patient acknowledged the ways in which they are at risk, and how working through each of their issues can gradually start to reduce their risk factors. Patient was encouraged to think of the information in the context of people in their own lives. Patient denied having access to firearms Patient verbalized understanding of information provided. Patient endorsed a desire to live.    The suicide prevention education provided includes the following: Suicide risk factors Suicide prevention and interventions National Suicide Hotline telephone number [facility] assessment telephone number [facility] Emergency Assistance 911 [facility] and/or Residential Mobile Crisis Unit telephone number   Richardson Chang [place], [place] 08/27/23 9:14 AM

## 2023-08-27 NOTE — Discharge Summary (Addendum)
 Physician Discharge Summary Note  Patient:  [name] is an 43 y.o., female MRN:  [id number] DOB:  Sep 03, 1980 Patient phone:  [phone] (home)  Patient address:   [address] [place] [address],  Total Time spent with patient: 20 minutes  Date of Admission:  08/21/2023 Date of Discharge: 08/27/2023  Reason for Admission:   [name] is a 43 y.o. female  with a past psychiatric history of borderline personality disorder, benzodiazepine dependence, alcohol use disorder, major depressive disorder. Patient initially arrived to [facility] on 5/13 under involuntary commitment after they received a message from her friend that she had texted plans to jump off a bridge, and admitted to [facility] under IVC on May 13 for acute safety concerns, crisis stabalization, and acute suicidal or self-harming behaviors. PPHx is significant for history of multiple suicide Attempts and multiple prior Psychiatric Hospitalizations. PMHx is significant for none.   Principal Problem: Major depressive disorder, recurrent severe without psychotic features [facility]) Discharge Diagnoses: Principal Problem:   Major depressive disorder, recurrent severe without psychotic features (HCC) Active Problems:   Sedative, hypnotic or anxiolytic use disorder, severe, dependence (HCC)   Alcohol use disorder   Substance induced mood disorder (HCC)   Borderline personality disorder (HCC)   Cannabis use disorder   Tobacco use disorder  Past Psychiatric History:  She reports prior diagnoses of PTSD, bipolar, MDD, GAD, borderline personality disorder. She reports that she "does not believe any of that crap." She reports that she stopped medications around a year ago because she felt like it was not working. She also reports that she had difficulty paying for the medications. She reports she was previously seeing the psychiatrist at [facility] but she stopped a year ago. She reports her last suicide attempt was in  2018 and she also had a couple of suicide attempts prior to that. Per chart review, she had prior hospitalizations at [facility] in 2008 x 2, 2010, 2011 x 3, 2012 x 3, 2014 x 1, 2022 x 2 and most recently in March 2024 at [facility]. She was last seen at [facility] outpatient in April 2024. She reports that she used to cut herself a long time ago but it was in the early 20s. She does confirm that the last medications that she had were the BuSpar , gabapentin , prazosin , Zoloft  and trazodone  which are prescribed from the behavioral health urgent care and from her last admission at [facility].   Past Medical History:  Past Medical History:  Diagnosis Date   Anxiety    Benzodiazepine abuse (HCC)    Bipolar 1 disorder (HCC)    Depression    Drug abuse (HCC)    ETOH abuse    Polysubstance abuse (HCC)    History reviewed. No pertinent surgical history. Family History:  Family History  Problem Relation Age of Onset   Drug abuse Mother    Anxiety disorder Mother    Anxiety disorder Father    Drug abuse Maternal Aunt    Alcohol abuse Maternal Grandfather    Drug abuse Cousin    Family Psychiatric  History:  She reports a family history of her mom with bipolar and borderline personality disorder and major depressive disorder as well as cannabis use. She reports her dad has PTSD and depression. She reports her sister has anxiety and her brother has had maybe 1 psych hospitalization a long time ago. She reports her mom had several suicide attempts.   Social History:  Social History   Substance and  Sexual Activity  Alcohol Use Yes     Social History   Substance and Sexual Activity  Drug Use Yes   Types: Marijuana, Benzodiazepines   Comment: Xanax     Social History   Socioeconomic History   Marital status: Single    Spouse name: Not on file   Number of children: 0   Years of education: Not on file   Highest education level: 11th grade  Occupational History   Not on file  Tobacco Use   Smoking status: Every  Day    Current packs/day: 1.50    Average packs/day: 1.5 packs/day for 17.0 years (25.5 ttl pk-yrs)    Types: Cigarettes   Smokeless tobacco: Never  Vaping Use   Vaping status: Never Used  Substance and Sexual Activity   Alcohol use: Yes   Drug use: Yes    Types: Marijuana, Benzodiazepines    Comment: Xanax    Sexual activity: Not Currently  Other Topics Concern   Not on file  Social History Narrative   Not on file   Social Drivers of Health   Financial Resource Strain: Not on file  Food Insecurity: Food Insecurity Present (08/21/2023)   Hunger Vital Sign    Worried About Running Out of Food in the Last Year: Sometimes true    Ran Out of Food in the Last Year: Sometimes true  Transportation Needs: Unmet Transportation Needs (08/21/2023)   PRAPARE - Administrator, Civil Service (Medical): Yes    Lack of Transportation (Non-Medical): Yes  Physical Activity: Not on file  Stress: Not on file  Social Connections: Not on file    Hospital Course:   During the patient's hospitalization, patient had extensive initial psychiatric evaluation, and follow-up psychiatric evaluations every day.  Psychiatric diagnoses provided upon initial assessment:  Borderline personality disorder MDD, recurrent, severe Generalized anxiety disorder Alcohol use disorder Sedative, hypnotic, or anxiolytic use disorder Cannabis use disorder Tobacco use disorder  Patient's psychiatric medications were adjusted on admission:   --start zoloft  50mg  for depression and anxiety           --start ativan  taper for ongoing BZD use   --start CIWA with PRN ativan  for BZD withdrawal   During the hospitalization, other adjustments were made to the patient's psychiatric medication regimen:  --ativan  taper for benzodiazepine dependence was completed  --zoloft  was increased to 100mg  and then decreased back to 50mg  due to patient reported side effect of "having funny head" and per request   Patient's  care was discussed during the interdisciplinary team meeting every day during the hospitalization.  The patient denied having additional side effects to prescribed psychiatric medication.  Gradually, patient started adjusting to milieu. The patient was evaluated each day by a clinical provider to ascertain response to treatment. Improvement was noted by the patient's report of decreasing symptoms, improved sleep and appetite, affect, medication tolerance, behavior, and participation in unit programming.  Patient was asked each day to complete a self inventory noting mood, mental status, pain, new symptoms, anxiety and concerns.    Symptoms were reported as significantly decreased or resolved completely by discharge.   On day of discharge, the patient reports that their mood is stable. The patient denied having suicidal thoughts for more than 48 hours prior to discharge.  Patient denies having homicidal thoughts.  Patient denies having auditory hallucinations.  Patient denies any visual hallucinations or other symptoms of psychosis. The patient was motivated to continue taking medication with a goal of continued improvement  in mental health.   The patient reports their target psychiatric symptoms of depression responded well to the psychiatric medications, and the patient reports overall benefit other psychiatric hospitalization. Supportive psychotherapy was provided to the patient. The patient also participated in regular group therapy while hospitalized. Coping skills, problem solving as well as relaxation therapies were also part of the unit programming.  Labs were reviewed with the patient, and abnormal results were discussed with the patient.  The patient is able to verbalize their individual safety plan to this provider.  Behavioral Events: none  # It is recommended to the patient to continue psychiatric medications as prescribed, after discharge from the hospital.    # It is recommended to  the patient to follow up with your outpatient psychiatric provider and PCP.  # It was discussed with the patient, the impact of alcohol, drugs, tobacco have been there overall psychiatric and medical wellbeing, and total abstinence from substance use was recommended to the patient.  # Prescriptions provided or sent directly to preferred pharmacy at discharge. Patient agreeable to plan. Given opportunity to ask questions. Appears to feel comfortable with discharge.    # In the event of worsening symptoms, the patient is instructed to call the crisis hotline, 911 and or go to the nearest ED for appropriate evaluation and treatment of symptoms. To follow-up with primary care provider for other medical issues, concerns and or health care needs  # Patient was discharged home with a plan to follow up as noted below.   Physical Findings: AIMS:  , ,  ,  ,    CIWA:  CIWA-Ar Total: 1 COWS:     Musculoskeletal: Strength & Muscle Tone: within normal limits Gait & Station: normal Patient leans: N/A  Psychiatric Specialty Exam General Appearance: appears at stated age, casually dressed and groomed   Behavior: pleasant and cooperative   Psychomotor Activity: no psychomotor agitation or retardation noted   Eye Contact: fair  Speech: normal amount, volume and fluency   Mood: euthymic  Affect: congruent, pleasant and interactive   Thought Process: linear, goal directed, no circumstantial or tangential thought process noted, no racing thoughts or flight of ideas  Descriptions of Associations: intact   Thought Content Hallucinations: denies AH, VH , does not appear responding to stimuli  Delusions: no paranoia, delusions of control, grandeur, ideas of reference, thought broadcasting, and magical thinking  Suicidal Thoughts: denies SI, intention, plan  Homicidal Thoughts: denies HI, intention, plan   Alertness/Orientation: alert and fully oriented   Insight: fair Judgment: fair  Memory:  intact   Executive Functions  Concentration: intact  Attention Span: fair  Recall: intact  Fund of Knowledge: fair   Physical Exam ROS Physical Exam Constitutional:      Appearance: the patient is not toxic-appearing.  Pulmonary:     Effort: Pulmonary effort is normal.  Neurological:     General: No focal deficit present.     Mental Status: the patient is alert and oriented to person, place, and time.   Review of Systems  Respiratory:  Negative for shortness of breath.   Cardiovascular:  Negative for chest pain.  Gastrointestinal:  Negative for abdominal pain, constipation, diarrhea, nausea and vomiting.  Neurological:  Negative for headaches.   Blood pressure (!) 137/95, pulse 73, temperature 98.7 F (37.1 C), temperature source Oral, resp. rate 18, height 5\' 2"  (1.575 m), weight 62.6 kg, SpO2 100%. Body mass index is 25.24 kg/m.  Social History   Tobacco Use  Smoking  Status Every Day   Current packs/day: 1.50   Average packs/day: 1.5 packs/day for 17.0 years (25.5 ttl pk-yrs)   Types: Cigarettes  Smokeless Tobacco Never   Tobacco Cessation:  A prescription for an FDA-approved tobacco cessation medication provided at discharge  Blood Alcohol level:  Lab Results  Component Value Date   ETH 172 (H) 08/21/2023   ETH 156 (H) 06/27/2022    Metabolic Disorder Labs:  Lab Results  Component Value Date   HGBA1C 4.9 08/23/2023   MPG 93.93 08/23/2023   MPG 103 06/27/2022   No results found for: "PROLACTIN" Lab Results  Component Value Date   CHOL 152 08/23/2023   TRIG 109 08/23/2023   HDL 51 08/23/2023   CHOLHDL 3.0 08/23/2023   VLDL 22 08/23/2023   LDLCALC 79 08/23/2023   LDLCALC 84 06/27/2022    Discharge destination:  Home  Is patient on multiple antipsychotic therapies at discharge:  No   Has Patient had three or more failed trials of antipsychotic monotherapy by history:  No  Recommended Plan for Multiple Antipsychotic Therapies: NA   Allergies as  of 08/27/2023       Reactions   Haldol  [haloperidol  Lactate] Other (See Comments)   Makes whole body stiff        Medication List     TAKE these medications      Indication  albuterol  108 (90 Base) MCG/ACT inhaler Commonly known as: VENTOLIN  HFA Inhale 2 puffs into the lungs every 6 (six) hours as needed for wheezing or shortness of breath.  Indication: Chronic Obstructive Lung Disease   hydrOXYzine  25 MG tablet Commonly known as: ATARAX  Take 1 tablet (25 mg total) by mouth at bedtime as needed (insomnia).  Indication: Feeling Anxious   nicotine  21 mg/24hr patch Commonly known as: NICODERM CQ  - dosed in mg/24 hours Place 1 patch (21 mg total) onto the skin daily.  Indication: Nicotine  Addiction   pantoprazole  40 MG tablet Commonly known as: PROTONIX  Take 1 tablet (40 mg total) by mouth daily.  Indication: Gastroesophageal Reflux Disease, Acid reflux   sertraline  50 MG tablet Commonly known as: ZOLOFT  Take 1 tablet (50 mg total) by mouth daily.  Indication: Major Depressive Disorder        Follow-up Information     [facility]. Go on 08/30/2023.   Specialty: Behavioral Health Why: Please go to this provider on 08/30/23 at 7:00 am for an assessment, to obtain medication management services. You may also go Monday through Friday, arrive by 7:00 am. Contact information: [address] [place]  [address] [phone]        [facility]. Go on 08/31/2023.   Specialty: Professional Counselor Why: Please go to this provider on 08/31/23 at 9:00 am for an assessment, to obtain therapy services. You may also go Monday through Friday, between 9 am and 1 pm. Contact information: [address]  [address] [place] [address] [phone]         [facility]. Go on 08/29/2023.   Specialty: Behavioral Health Why: You have an appointment on 08/29/23 at 9:00 am.  This program meets  in person M, W, F from 9 am to 12 pm and runs for 8-12 weeks.  Clients can also receive individual and family therapy and MAT.  There is weekly drug testing.  The program is abstinence-based and AA, NA, Smart Recovery, etc. attendance is encouraged.  For questions, please call [phone]. Contact information: [address]  [facility] [place]  [address] [phone]                Discharge recommendations:  Activity: as tolerated  Diet: heart healthy  # It is recommended to the patient to continue psychiatric medications as prescribed, after discharge from the hospital.     # It is recommended to the patient to follow up with your outpatient psychiatric provider -instructions on appointment date, time, and address (location) are provided to you in discharge paperwork  # Follow-up with outpatient primary care doctor and other specialists -for management of chronic medical disease, including:  --none  # Testing: Follow-up with outpatient provider for abnormal lab results:  -K 3.4, alkaline phosphatase 37   # It was discussed with the patient, the impact of alcohol, drugs, tobacco have been there overall psychiatric and medical wellbeing, and total abstinence from substance use was recommended to the patient.   # Prescriptions provided or sent directly to preferred pharmacy at discharge. Patient agreeable to plan. Given opportunity to ask questions. Appears to feel comfortable with discharge.    # In the event of worsening symptoms, the patient is instructed to call the crisis hotline, 911 and or go to the nearest ED for appropriate evaluation and treatment of symptoms. To follow-up with primary care provider for other medical issues, concerns and or health care needs  Patient agrees with D/C instructions and plan.   This case was discussed with attending Dr. Linnie Riches who agrees with the above formulated treatment plan. Please see attending attestation for additional details.    This note was created using a voice recognition software as a result there may be grammatical errors inadvertently enclosed that do not reflect the nature of this encounter. Every attempt is made to correct such errors.   Signed: Norbert Bean, MD, PGY-2 08/27/2023, 8:27 AM

## 2023-08-29 ENCOUNTER — Telehealth (HOSPITAL_COMMUNITY): Payer: Self-pay

## 2023-08-29 ENCOUNTER — Ambulatory Visit (HOSPITAL_COMMUNITY): Payer: Self-pay

## 2023-08-29 NOTE — Telephone Encounter (Signed)
 Therapist reaches out to [place] via telephone as she missed her appointment to be evaluated for the CD-IOP program.  Voice Mail picks up and therapist leaves a HIPAA compliant message requesting a return call.  Earnie Gola, MS, LMFT, LCAS

## 2023-09-10 ENCOUNTER — Telehealth (HOSPITAL_COMMUNITY): Payer: Self-pay

## 2023-09-10 NOTE — Telephone Encounter (Signed)
 PT wanted to schedule an appointment - Due to noshows I informed PT they had to come as a walk in - PT states she cannot do that due to transportation I apologized to the PT but as per policy she must use our walk in hour clinic

## 2024-01-04 ENCOUNTER — Emergency Department (HOSPITAL_COMMUNITY): Payer: Self-pay

## 2024-01-04 ENCOUNTER — Other Ambulatory Visit: Payer: Self-pay

## 2024-01-04 ENCOUNTER — Emergency Department (HOSPITAL_COMMUNITY)
Admission: EM | Admit: 2024-01-04 | Discharge: 2024-01-04 | Disposition: A | Discharge status: Home / Self Care | Payer: Self-pay

## 2024-01-04 ENCOUNTER — Encounter (HOSPITAL_COMMUNITY): Payer: Self-pay

## 2024-01-04 DIAGNOSIS — R799 Abnormal finding of blood chemistry, unspecified: Secondary | ICD-10-CM | POA: Insufficient documentation

## 2024-01-04 DIAGNOSIS — R109 Unspecified abdominal pain: Secondary | ICD-10-CM

## 2024-01-04 DIAGNOSIS — M48061 Spinal stenosis, lumbar region without neurogenic claudication: Secondary | ICD-10-CM

## 2024-01-04 DIAGNOSIS — K529 Noninfective gastroenteritis and colitis, unspecified: Secondary | ICD-10-CM | POA: Insufficient documentation

## 2024-01-04 LAB — COMPREHENSIVE METABOLIC PANEL WITH GFR
ALT: 14 U/L (ref 0–44)
AST: 16 U/L (ref 15–41)
Albumin: 3.7 g/dL (ref 3.5–5.0)
Alkaline Phosphatase: 48 U/L (ref 38–126)
Anion gap: 9 (ref 5–15)
BUN: 12 mg/dL (ref 6–20)
CO2: 22 mmol/L (ref 22–32)
Calcium: 8.9 mg/dL (ref 8.9–10.3)
Chloride: 106 mmol/L (ref 98–111)
Creatinine, Ser: 0.65 mg/dL (ref 0.44–1.00)
GFR, Estimated: >60 mL/min (ref >60)
Glucose, Bld: 106 mg/dL — ABNORMAL HIGH (ref 70–99)
Potassium: 3.7 mmol/L (ref 3.5–5.1)
Sodium: 137 mmol/L (ref 135–145)
Total Bilirubin: 0.9 mg/dL (ref 0.0–1.2)
Total Protein: 6.8 g/dL (ref 6.5–8.1)

## 2024-01-04 LAB — CBC WITH DIFFERENTIAL/PLATELET
Abs Immature Granulocytes: 0.04 K/uL (ref 0.00–0.07)
Basophils Absolute: 0 K/uL (ref 0.0–0.1)
Basophils Relative: 0 %
Eosinophils Absolute: 0 K/uL (ref 0.0–0.5)
Eosinophils Relative: 0 %
HCT: 40.3 % (ref 36.0–46.0)
Hemoglobin: 13.6 g/dL (ref 12.0–15.0)
Immature Granulocytes: 0 %
Lymphocytes Relative: 22 %
Lymphs Abs: 2.4 K/uL (ref 0.7–4.0)
MCH: 32.9 pg (ref 26.0–34.0)
MCHC: 33.7 g/dL (ref 30.0–36.0)
MCV: 97.6 fL (ref 80.0–100.0)
Monocytes Absolute: 0.7 K/uL (ref 0.1–1.0)
Monocytes Relative: 6 %
Neutro Abs: 7.6 K/uL (ref 1.7–7.7)
Neutrophils Relative %: 72 %
Platelets: 327 K/uL (ref 150–400)
RBC: 4.13 MIL/uL (ref 3.87–5.11)
RDW: 11.9 % (ref 11.5–15.5)
WBC: 10.8 K/uL — ABNORMAL HIGH (ref 4.0–10.5)
nRBC: 0 % (ref 0.0–0.2)

## 2024-01-04 LAB — URINALYSIS, ROUTINE W REFLEX MICROSCOPIC
Bilirubin Urine: NEGATIVE
Glucose, UA: NEGATIVE mg/dL
Ketones, ur: NEGATIVE mg/dL
Leukocytes,Ua: NEGATIVE
Nitrite: NEGATIVE
Protein, ur: NEGATIVE mg/dL
Specific Gravity, Urine: 1.026 (ref 1.005–1.030)
pH: 6 (ref 5.0–8.0)

## 2024-01-04 LAB — LIPASE, BLOOD: Lipase: 15 U/L (ref 11–51)

## 2024-01-04 LAB — PREGNANCY, URINE: Preg Test, Ur: NEGATIVE

## 2024-01-04 MED ORDER — IOHEXOL 350 MG/ML SOLN
75.0000 mL | Freq: Once | INTRAVENOUS | Status: AC | PRN
  Administered 2024-01-04: 75 mL via INTRAVENOUS

## 2024-01-04 MED ORDER — ACETAMINOPHEN 500 MG PO TABS
1000.0000 mg | ORAL_TABLET | Freq: Once | ORAL | Status: AC
  Administered 2024-01-04: 1000 mg via ORAL
  Filled 2024-01-04: qty 2

## 2024-01-04 MED ORDER — SODIUM CHLORIDE 0.9 % IV BOLUS
1000.0000 mL | Freq: Once | INTRAVENOUS | Status: AC
  Administered 2024-01-04: 1000 mL via INTRAVENOUS

## 2024-01-04 MED ORDER — MORPHINE SULFATE (PF) 4 MG/ML IV SOLN: 4.0000 mg | Freq: Once | INTRAVENOUS | Status: DC

## 2024-01-04 MED ORDER — ONDANSETRON HCL 4 MG/2ML IJ SOLN
4.0000 mg | Freq: Once | INTRAMUSCULAR | Status: AC
  Administered 2024-01-04: 4 mg via INTRAVENOUS
  Filled 2024-01-04: qty 2

## 2024-01-04 MED ORDER — ONDANSETRON 4 MG PO TBDP
4.0000 mg | ORAL_TABLET | Freq: Three times a day (TID) | ORAL | 0 refills | Status: AC | PRN
  Filled 2024-01-04: qty 12, 4d supply, fill #0

## 2024-01-04 NOTE — Discharge Instructions (Addendum)
 Follow up with primary care, please call [place] [facility]. You will need to follow up with GI due to the recurrent nature of the abdominal pain.  Return to the ER for severe or concerning symptoms.

## 2024-01-04 NOTE — ED Triage Notes (Signed)
 Pt states she had vomiting and diarrhea all day yesterday. Pt states she noted dark red blood in stool. Pt states she continued to have bloody stool this am. Pt c/o cramping in abdomen as well. Pt has some weakness and dizziness.

## 2024-01-04 NOTE — ED Provider Notes (Signed)
 [place] EMERGENCY DEPARTMENT AT [facility] Provider Note   CSN: [id number] Arrival date & time: 01/04/24  1206     Patient presents with: Emesis  HPI [name] is a 43 y.o. female with history of polysubstance abuse, bipolar 1 presenting for vomiting and diarrhea. Started yesterday and lasted all day.  Also reports some pain about the mid abdomen.  She states this morning she had a small formed stool and thinks maybe there was a small amount of dark red blood in the stool.  Also reports recent use of marijuana.  She states overall the abdominal pain has improved since yesterday. Also reports that she takes Goody powder frequently for lower back pain that has been there for several years.  Denies urinary or vaginal symptoms.    Emesis      Prior to Admission medications   Medication Sig Start Date End Date Taking? Authorizing Provider  albuterol  (VENTOLIN  HFA) 108 (90 Base) MCG/ACT inhaler Inhale 2 puffs into the lungs every 6 (six) hours as needed for wheezing or shortness of breath. 06/30/22   Clapacs, Norleen DASEN, MD  pantoprazole  (PROTONIX ) 40 MG tablet Take 1 tablet (40 mg total) by mouth daily. Patient taking differently: Take 40 mg by mouth daily as needed (indigestion). 06/30/22   Clapacs, Norleen DASEN, MD  sertraline  (ZOLOFT ) 50 MG tablet Take 1 tablet (50 mg total) by mouth daily. 08/27/23 09/26/23  Graham Krabbe, MD    Allergies: Haldol  [haloperidol  lactate]    Review of Systems  Gastrointestinal:  Positive for vomiting.    Updated Vital Signs BP 115/84 (BP Location: Right Arm)   Pulse (!) 103   Temp 97.8 F (36.6 C)   Resp 19   Ht 5' 2 (1.575 m)   Wt 70.3 kg   LMP 01/01/2024 (Approximate)   SpO2 99%   BMI 28.35 kg/m   Physical Exam Vitals and nursing note reviewed.  HENT:     Head: Normocephalic and atraumatic.     Mouth/Throat:     Mouth: Mucous membranes are moist.  Eyes:     General:        Right eye: No discharge.        Left  eye: No discharge.     Conjunctiva/sclera: Conjunctivae normal.  Cardiovascular:     Rate and Rhythm: Normal rate and regular rhythm.     Pulses: Normal pulses.     Heart sounds: Normal heart sounds.  Pulmonary:     Effort: Pulmonary effort is normal.     Breath sounds: Normal breath sounds.  Abdominal:     General: Abdomen is flat. There is no distension.     Palpations: Abdomen is soft.     Tenderness: There is abdominal tenderness.  Skin:    General: Skin is warm and dry.  Neurological:     General: No focal deficit present.  Psychiatric:        Mood and Affect: Mood normal.     (all labs ordered are listed, but only abnormal results are displayed) Labs Reviewed  CBC WITH DIFFERENTIAL/PLATELET - Abnormal; Notable for the following components:      Result Value   WBC 10.8 (*)    All other components within normal limits  COMPREHENSIVE METABOLIC PANEL WITH GFR - Abnormal; Notable for the following components:   Glucose, Bld 106 (*)    All other components within normal limits  LIPASE, BLOOD  URINALYSIS, ROUTINE W REFLEX MICROSCOPIC  PREGNANCY, URINE  HCG, SERUM,  QUALITATIVE    EKG: None  Radiology: No results found.   Procedures   Medications Ordered in the ED  sodium chloride  0.9 % bolus 1,000 mL (1,000 mLs Intravenous New Bag/Given 01/04/24 1502)  ondansetron  (ZOFRAN ) injection 4 mg (4 mg Intravenous Given 01/04/24 1500)  acetaminophen  (TYLENOL ) tablet 1,000 mg (1,000 mg Oral Given 01/04/24 1500)                                    Medical Decision Making  42 year old well-appearing female presenting for diarrhea and emesis and abdominal pain.  Exam notable for mild generalized abdominal tenderness.  DDx includes gastric perforation, other upper versus lower GI bleed, appendicitis, acute cholecystitis, kidney stone, symptomatic anemia,  other.  Thus far workup reassuring.  Patient initially mildly tachycardic.  Will treat with fluids and Zofran  and give Tylenol   for pain.  CT is pending.  If remaining workup is reassuring patient can discharge with PCP follow-up.  Signed out to oncoming resident,     Final diagnoses:  Abdominal pain, unspecified abdominal location    ED Discharge Orders     None          Lang Norleen POUR, PA-C 01/04/24 1529    Ula Prentice SAUNDERS, MD 01/04/24 [phone]

## 2024-01-04 NOTE — ED Provider Triage Note (Signed)
 Emergency Medicine Provider Triage Evaluation Note  Reeta Kuk , a 43 y.o. female  was evaluated in triage.  Pt complains of vomiting and hematochezia. Patient reports multiple episodes of emesis and diarrhea that began yesterday. She reported multiple bloody stools. No hematemesis. She reported chills yesterday. No sick contacts. No hx of abdominal surgeries. No hx diabetes or other medical problems.  Review of Systems  Positive: Nausea, abdominal pain, diarrhea, chills  Negative: SOB, chest pain  Physical Exam  BP 115/84 (BP Location: Right Arm)   Pulse (!) 103   Temp 97.8 F (36.6 C)   Resp 19   Ht 5' 2 (1.575 m)   Wt 70.3 kg   LMP 01/01/2024 (Approximate)   SpO2 97%   BMI 28.35 kg/m  Gen:   Awake, no distress  Resp:  Normal effort. Regular rate MSK:   Moves extremities without difficulty Other:  No abdominal tenderness. No guarding on exam. [place]. - Rovsings  Medical Decision Making  Medically screening exam initiated at 12:49 PM.  Appropriate orders placed.  Chai Verdejo was informed that the remainder of the evaluation will be completed by another provider, this initial triage assessment does not replace that evaluation, and the importance of remaining in the ED until their evaluation is complete.    Torrence Marry RAMAN, PA-C 01/04/24 1252

## 2024-01-04 NOTE — ED Notes (Signed)
 Patient discharged to home with self. Patient given written and oral discharge instructions. Patient verbalized understanding. Patient ambulatory to exit with steady gait. Patient breathing without difficulty. Patient denies questions, concerns or  needs at this time.

## 2024-01-04 NOTE — Progress Notes (Signed)
 RNCM received phone call from ED for PCP/GI information.  Per AVS, patient is to follow up with [facility] for PCP  and Dr. Victory Moats for GI.

## 2024-01-04 NOTE — ED Provider Notes (Signed)
 43 yo female with abdominal pain with v/n/d. Chronic lower back pain (taking more Goodys due to worsening pain).  Pending CTs and urine.  Physical Exam  BP 115/84 (BP Location: Right Arm)   Pulse (!) 103   Temp 97.8 F (36.6 C)   Resp 19   Ht 5' 2 (1.575 m)   Wt 70.3 kg   LMP 01/01/2024 (Approximate)   SpO2 99%   BMI 28.35 kg/m   Physical Exam  Procedures  Procedures  ED Course / MDM    Medical Decision Making Amount and/or Complexity of Data Reviewed Labs: ordered. Radiology: ordered.  Risk OTC drugs. Prescription drug management.   CT a/p with colitis CT lumbar with spinal stenosis  Recommend follow up with PCP, referred to [place] [facility]. Pt reports symptoms recurrent in nature, recommend see GI. Zofran  for nausea. Return to ER as needed.        Beverley Leita LABOR, PA-C 01/04/24 1816    Garrick Charleston, MD 01/04/24 [phone]

## 2024-01-07 ENCOUNTER — Other Ambulatory Visit: Payer: Self-pay
